# Patient Record
Sex: Female | Born: 1935 | Race: Black or African American | Hispanic: No | Smoking: Never smoker
Health system: Southern US, Community
[De-identification: ages and names within clinical notes are randomized; demographics above are authoritative.]

## PROBLEM LIST (undated history)

## (undated) DIAGNOSIS — I2510 Atherosclerotic heart disease of native coronary artery without angina pectoris: Secondary | ICD-10-CM

## (undated) DIAGNOSIS — R441 Visual hallucinations: Secondary | ICD-10-CM

## (undated) DIAGNOSIS — F209 Schizophrenia, unspecified: Secondary | ICD-10-CM

## (undated) DIAGNOSIS — N183 Chronic kidney disease, stage 3 (moderate): Secondary | ICD-10-CM

## (undated) DIAGNOSIS — I428 Other cardiomyopathies: Secondary | ICD-10-CM

## (undated) DIAGNOSIS — I5032 Chronic diastolic (congestive) heart failure: Secondary | ICD-10-CM

## (undated) DIAGNOSIS — F419 Anxiety disorder, unspecified: Secondary | ICD-10-CM

## (undated) DIAGNOSIS — I10 Essential (primary) hypertension: Secondary | ICD-10-CM

## (undated) DIAGNOSIS — M1990 Unspecified osteoarthritis, unspecified site: Secondary | ICD-10-CM

## (undated) DIAGNOSIS — E11359 Type 2 diabetes mellitus with proliferative diabetic retinopathy without macular edema: Secondary | ICD-10-CM

## (undated) DIAGNOSIS — K219 Gastro-esophageal reflux disease without esophagitis: Secondary | ICD-10-CM

## (undated) DIAGNOSIS — H409 Unspecified glaucoma: Secondary | ICD-10-CM

## (undated) DIAGNOSIS — I5020 Unspecified systolic (congestive) heart failure: Secondary | ICD-10-CM

## (undated) DIAGNOSIS — G4733 Obstructive sleep apnea (adult) (pediatric): Secondary | ICD-10-CM

## (undated) DIAGNOSIS — E785 Hyperlipidemia, unspecified: Secondary | ICD-10-CM

## (undated) DIAGNOSIS — Z5189 Encounter for other specified aftercare: Secondary | ICD-10-CM

## (undated) HISTORY — DX: Other cardiomyopathies: I42.8

## (undated) HISTORY — PX: CORONARY ANGIOPLASTY WITH STENT PLACEMENT: SHX49

---

## 2003-10-07 ENCOUNTER — Encounter: Payer: Self-pay | Admitting: Emergency Medicine

## 2003-10-07 ENCOUNTER — Emergency Department (HOSPITAL_COMMUNITY): Admission: EM | Admit: 2003-10-07 | Discharge: 2003-10-07 | Payer: Self-pay | Admitting: Emergency Medicine

## 2004-03-20 ENCOUNTER — Ambulatory Visit (HOSPITAL_COMMUNITY): Admission: RE | Admit: 2004-03-20 | Discharge: 2004-03-20 | Payer: Self-pay | Admitting: *Deleted

## 2004-03-20 ENCOUNTER — Encounter: Payer: Self-pay | Admitting: Cardiology

## 2005-03-09 ENCOUNTER — Emergency Department (HOSPITAL_COMMUNITY): Admission: EM | Admit: 2005-03-09 | Discharge: 2005-03-09 | Payer: Self-pay | Admitting: *Deleted

## 2005-03-18 ENCOUNTER — Emergency Department (HOSPITAL_COMMUNITY): Admission: EM | Admit: 2005-03-18 | Discharge: 2005-03-18 | Payer: Self-pay | Admitting: Emergency Medicine

## 2005-03-20 ENCOUNTER — Ambulatory Visit: Payer: Self-pay | Admitting: Internal Medicine

## 2005-03-25 ENCOUNTER — Ambulatory Visit: Payer: Self-pay | Admitting: Internal Medicine

## 2005-04-06 ENCOUNTER — Ambulatory Visit: Payer: Self-pay | Admitting: Internal Medicine

## 2005-04-20 ENCOUNTER — Ambulatory Visit: Payer: Self-pay | Admitting: Internal Medicine

## 2005-05-05 ENCOUNTER — Ambulatory Visit: Payer: Self-pay | Admitting: Internal Medicine

## 2005-05-14 ENCOUNTER — Ambulatory Visit: Payer: Self-pay | Admitting: Internal Medicine

## 2005-05-21 ENCOUNTER — Ambulatory Visit: Payer: Self-pay | Admitting: Internal Medicine

## 2005-05-28 ENCOUNTER — Ambulatory Visit: Payer: Self-pay | Admitting: Internal Medicine

## 2005-12-16 ENCOUNTER — Emergency Department (HOSPITAL_COMMUNITY): Admission: EM | Admit: 2005-12-16 | Discharge: 2005-12-17 | Payer: Self-pay | Admitting: Emergency Medicine

## 2005-12-28 ENCOUNTER — Ambulatory Visit: Payer: Self-pay | Admitting: Internal Medicine

## 2006-01-19 ENCOUNTER — Ambulatory Visit: Payer: Self-pay | Admitting: Hospitalist

## 2006-02-02 ENCOUNTER — Ambulatory Visit: Payer: Self-pay | Admitting: Internal Medicine

## 2006-02-10 ENCOUNTER — Ambulatory Visit: Payer: Self-pay | Admitting: Internal Medicine

## 2006-02-11 ENCOUNTER — Ambulatory Visit: Payer: Self-pay | Admitting: Cardiovascular Disease

## 2006-02-12 ENCOUNTER — Ambulatory Visit: Payer: Self-pay

## 2006-02-25 ENCOUNTER — Ambulatory Visit: Payer: Self-pay | Admitting: Internal Medicine

## 2006-03-11 ENCOUNTER — Ambulatory Visit: Payer: Self-pay | Admitting: Hospitalist

## 2006-03-19 ENCOUNTER — Ambulatory Visit: Payer: Self-pay | Admitting: Cardiovascular Disease

## 2006-05-24 ENCOUNTER — Ambulatory Visit: Payer: Self-pay | Admitting: Internal Medicine

## 2006-05-28 ENCOUNTER — Ambulatory Visit: Payer: Self-pay | Admitting: Internal Medicine

## 2006-05-28 ENCOUNTER — Inpatient Hospital Stay (HOSPITAL_COMMUNITY): Admission: AD | Admit: 2006-05-28 | Discharge: 2006-06-03 | Payer: Self-pay | Admitting: Internal Medicine

## 2006-05-31 ENCOUNTER — Encounter: Payer: Self-pay | Admitting: Internal Medicine

## 2006-06-07 ENCOUNTER — Ambulatory Visit: Payer: Self-pay | Admitting: Internal Medicine

## 2006-06-09 ENCOUNTER — Ambulatory Visit: Payer: Self-pay | Admitting: Internal Medicine

## 2006-06-16 ENCOUNTER — Ambulatory Visit: Payer: Self-pay | Admitting: Internal Medicine

## 2006-06-17 ENCOUNTER — Ambulatory Visit: Payer: Self-pay | Admitting: Cardiovascular Disease

## 2006-06-28 ENCOUNTER — Ambulatory Visit: Payer: Self-pay | Admitting: Internal Medicine

## 2006-07-01 ENCOUNTER — Ambulatory Visit (HOSPITAL_COMMUNITY): Admission: RE | Admit: 2006-07-01 | Discharge: 2006-07-01 | Payer: Self-pay | Admitting: Internal Medicine

## 2006-09-29 ENCOUNTER — Emergency Department (HOSPITAL_COMMUNITY): Admission: EM | Admit: 2006-09-29 | Discharge: 2006-09-29 | Payer: Self-pay | Admitting: Emergency Medicine

## 2006-10-01 ENCOUNTER — Ambulatory Visit: Payer: Self-pay | Admitting: Internal Medicine

## 2006-10-01 LAB — CONVERTED CEMR LAB
CO2: 25 meq/L (ref 19–32)
Chloride: 102 meq/L (ref 96–112)
Glucose, Bld: 134 mg/dL — ABNORMAL HIGH (ref 70–99)
Sodium: 141 meq/L (ref 135–145)

## 2006-12-27 DIAGNOSIS — E113599 Type 2 diabetes mellitus with proliferative diabetic retinopathy without macular edema, unspecified eye: Secondary | ICD-10-CM

## 2006-12-27 DIAGNOSIS — E785 Hyperlipidemia, unspecified: Secondary | ICD-10-CM

## 2006-12-27 DIAGNOSIS — I10 Essential (primary) hypertension: Secondary | ICD-10-CM

## 2006-12-27 DIAGNOSIS — I5032 Chronic diastolic (congestive) heart failure: Secondary | ICD-10-CM

## 2006-12-27 DIAGNOSIS — H547 Unspecified visual loss: Secondary | ICD-10-CM

## 2006-12-27 HISTORY — DX: Hyperlipidemia, unspecified: E78.5

## 2007-01-10 DIAGNOSIS — N183 Chronic kidney disease, stage 3 unspecified: Secondary | ICD-10-CM

## 2007-01-10 DIAGNOSIS — M109 Gout, unspecified: Secondary | ICD-10-CM

## 2007-01-10 HISTORY — DX: Chronic kidney disease, stage 3 unspecified: N18.30

## 2007-04-25 ENCOUNTER — Telehealth (INDEPENDENT_AMBULATORY_CARE_PROVIDER_SITE_OTHER): Payer: Self-pay | Admitting: *Deleted

## 2007-05-30 ENCOUNTER — Ambulatory Visit: Payer: Self-pay | Admitting: Internal Medicine

## 2007-05-30 LAB — CONVERTED CEMR LAB: Hgb A1c MFr Bld: 6.5 %

## 2007-06-03 ENCOUNTER — Ambulatory Visit: Payer: Self-pay | Admitting: Internal Medicine

## 2007-06-03 ENCOUNTER — Encounter: Payer: Self-pay | Admitting: Internal Medicine

## 2007-06-20 LAB — CONVERTED CEMR LAB
BUN: 19 mg/dL (ref 6–23)
CO2: 27 meq/L (ref 19–32)
Creatinine, Ser: 1.1 mg/dL (ref 0.40–1.20)
Glucose, Bld: 102 mg/dL — ABNORMAL HIGH (ref 70–99)
Sodium: 140 meq/L (ref 135–145)
TSH: 0.752 microintl units/mL (ref 0.350–5.50)
Total CHOL/HDL Ratio: 4.4

## 2007-10-21 ENCOUNTER — Telehealth: Payer: Self-pay | Admitting: Internal Medicine

## 2008-01-03 ENCOUNTER — Encounter (INDEPENDENT_AMBULATORY_CARE_PROVIDER_SITE_OTHER): Payer: Self-pay | Admitting: *Deleted

## 2008-01-03 ENCOUNTER — Ambulatory Visit: Payer: Self-pay | Admitting: Internal Medicine

## 2008-01-03 DIAGNOSIS — J309 Allergic rhinitis, unspecified: Secondary | ICD-10-CM | POA: Insufficient documentation

## 2008-01-03 LAB — CONVERTED CEMR LAB
Blood Glucose, Fingerstick: 145
Hgb A1c MFr Bld: 7.3 %

## 2008-01-04 LAB — CONVERTED CEMR LAB
Albumin: 3.9 g/dL (ref 3.5–5.2)
BUN: 22 mg/dL (ref 6–23)
CO2: 27 meq/L (ref 19–32)
Calcium: 9 mg/dL (ref 8.4–10.5)
Chloride: 104 meq/L (ref 96–112)
Potassium: 4.3 meq/L (ref 3.5–5.3)
Sodium: 141 meq/L (ref 135–145)
TSH: 0.817 microintl units/mL (ref 0.350–5.50)
Total Bilirubin: 0.4 mg/dL (ref 0.3–1.2)
Total Protein: 7.7 g/dL (ref 6.0–8.3)

## 2008-01-15 ENCOUNTER — Emergency Department (HOSPITAL_COMMUNITY): Admission: EM | Admit: 2008-01-15 | Discharge: 2008-01-15 | Payer: Self-pay | Admitting: Emergency Medicine

## 2008-02-08 ENCOUNTER — Ambulatory Visit: Payer: Self-pay | Admitting: Internal Medicine

## 2008-02-08 ENCOUNTER — Encounter: Payer: Self-pay | Admitting: Internal Medicine

## 2008-02-08 LAB — CONVERTED CEMR LAB
ALT: 12 units/L (ref 0–35)
AST: 11 units/L (ref 0–37)
BUN: 16 mg/dL (ref 6–23)
Blood Glucose, Fingerstick: 124
Creatinine, Ser: 1.22 mg/dL — ABNORMAL HIGH (ref 0.40–1.20)
Creatinine, Urine: 18.5 mg/dL
Eosinophils Relative: 4 % (ref 0–5)
Glucose, Bld: 56 mg/dL — ABNORMAL LOW (ref 70–99)
HCT: 44.8 % (ref 36.0–46.0)
Hemoglobin, Urine: NEGATIVE
LDL Cholesterol: 177 mg/dL — ABNORMAL HIGH (ref 0–99)
Leukocytes, UA: NEGATIVE
Lymphs Abs: 2.6 10*3/uL (ref 0.7–4.0)
MCHC: 31.9 g/dL (ref 30.0–36.0)
Microalb, Ur: 2.05 mg/dL — ABNORMAL HIGH (ref 0.00–1.89)
Monocytes Absolute: 0.7 10*3/uL (ref 0.1–1.0)
Neutro Abs: 3.3 10*3/uL (ref 1.7–7.7)
Neutrophils Relative %: 48 % (ref 43–77)
Nitrite: NEGATIVE
Platelets: 273 10*3/uL (ref 150–400)
Protein, ur: NEGATIVE mg/dL
Sodium: 143 meq/L (ref 135–145)
Specific Gravity, Urine: 1.009 (ref 1.005–1.03)
Total Bilirubin: 0.4 mg/dL (ref 0.3–1.2)
Total Protein: 8 g/dL (ref 6.0–8.3)
Triglycerides: 235 mg/dL — ABNORMAL HIGH (ref <150)
Urine Glucose: NEGATIVE mg/dL
pH: 6.5 (ref 5.0–8.0)

## 2008-02-15 ENCOUNTER — Ambulatory Visit: Payer: Self-pay | Admitting: Internal Medicine

## 2008-03-19 ENCOUNTER — Encounter: Payer: Self-pay | Admitting: Internal Medicine

## 2008-04-09 ENCOUNTER — Encounter: Payer: Self-pay | Admitting: Internal Medicine

## 2008-05-07 ENCOUNTER — Encounter: Payer: Self-pay | Admitting: Internal Medicine

## 2008-05-08 ENCOUNTER — Telehealth (INDEPENDENT_AMBULATORY_CARE_PROVIDER_SITE_OTHER): Payer: Self-pay | Admitting: *Deleted

## 2008-05-09 ENCOUNTER — Encounter: Payer: Self-pay | Admitting: Internal Medicine

## 2008-10-07 ENCOUNTER — Emergency Department (HOSPITAL_COMMUNITY): Admission: EM | Admit: 2008-10-07 | Discharge: 2008-10-07 | Payer: Self-pay | Admitting: Emergency Medicine

## 2008-10-10 ENCOUNTER — Ambulatory Visit: Payer: Self-pay | Admitting: Internal Medicine

## 2008-10-10 ENCOUNTER — Encounter: Payer: Self-pay | Admitting: Internal Medicine

## 2008-10-10 DIAGNOSIS — R42 Dizziness and giddiness: Secondary | ICD-10-CM | POA: Insufficient documentation

## 2008-10-10 LAB — CONVERTED CEMR LAB
Blood Glucose, Fingerstick: 141
Calcium: 8.9 mg/dL (ref 8.4–10.5)
Chloride: 101 meq/L (ref 96–112)
Creatinine, Ser: 1.37 mg/dL — ABNORMAL HIGH (ref 0.40–1.20)
Glucose, Bld: 134 mg/dL — ABNORMAL HIGH (ref 70–99)
Sodium: 142 meq/L (ref 135–145)

## 2008-10-15 ENCOUNTER — Telehealth: Payer: Self-pay | Admitting: Internal Medicine

## 2008-10-16 ENCOUNTER — Encounter: Payer: Self-pay | Admitting: Internal Medicine

## 2008-10-16 ENCOUNTER — Ambulatory Visit (HOSPITAL_COMMUNITY): Admission: RE | Admit: 2008-10-16 | Discharge: 2008-10-16 | Payer: Self-pay | Admitting: Internal Medicine

## 2008-10-16 ENCOUNTER — Ambulatory Visit: Payer: Self-pay | Admitting: Cardiology

## 2008-10-19 DIAGNOSIS — I2510 Atherosclerotic heart disease of native coronary artery without angina pectoris: Secondary | ICD-10-CM | POA: Insufficient documentation

## 2008-11-01 ENCOUNTER — Ambulatory Visit: Payer: Self-pay | Admitting: Cardiovascular Disease

## 2008-12-06 ENCOUNTER — Ambulatory Visit: Payer: Self-pay | Admitting: Internal Medicine

## 2008-12-06 ENCOUNTER — Encounter: Payer: Self-pay | Admitting: Internal Medicine

## 2008-12-06 ENCOUNTER — Encounter: Payer: Self-pay | Admitting: Licensed Clinical Social Worker

## 2008-12-06 DIAGNOSIS — H409 Unspecified glaucoma: Secondary | ICD-10-CM | POA: Insufficient documentation

## 2008-12-06 HISTORY — DX: Unspecified glaucoma: H40.9

## 2008-12-06 LAB — CONVERTED CEMR LAB
Creatinine, Ser: 1.1 mg/dL (ref 0.40–1.20)
Glucose, Bld: 132 mg/dL — ABNORMAL HIGH (ref 70–99)
Potassium: 4.3 meq/L (ref 3.5–5.3)

## 2008-12-17 ENCOUNTER — Encounter: Payer: Self-pay | Admitting: Internal Medicine

## 2008-12-24 ENCOUNTER — Ambulatory Visit (HOSPITAL_COMMUNITY): Admission: RE | Admit: 2008-12-24 | Discharge: 2008-12-24 | Payer: Self-pay | Admitting: Ophthalmology

## 2009-01-28 DIAGNOSIS — E113599 Type 2 diabetes mellitus with proliferative diabetic retinopathy without macular edema, unspecified eye: Secondary | ICD-10-CM

## 2009-01-28 DIAGNOSIS — E11359 Type 2 diabetes mellitus with proliferative diabetic retinopathy without macular edema: Secondary | ICD-10-CM

## 2009-01-28 HISTORY — DX: Type 2 diabetes mellitus with proliferative diabetic retinopathy without macular edema: E11.359

## 2009-01-28 LAB — HM DIABETES EYE EXAM

## 2009-01-30 ENCOUNTER — Ambulatory Visit (HOSPITAL_COMMUNITY): Admission: RE | Admit: 2009-01-30 | Discharge: 2009-01-30 | Payer: Self-pay | Admitting: Ophthalmology

## 2009-03-04 ENCOUNTER — Ambulatory Visit: Payer: Self-pay | Admitting: Internal Medicine

## 2009-03-04 LAB — CONVERTED CEMR LAB: Hgb A1c MFr Bld: 6.4 %

## 2009-04-29 ENCOUNTER — Ambulatory Visit (HOSPITAL_COMMUNITY): Admission: RE | Admit: 2009-04-29 | Discharge: 2009-04-29 | Payer: Self-pay | Admitting: Ophthalmology

## 2009-05-17 DIAGNOSIS — R609 Edema, unspecified: Secondary | ICD-10-CM | POA: Insufficient documentation

## 2009-05-22 ENCOUNTER — Ambulatory Visit: Payer: Self-pay | Admitting: Cardiovascular Disease

## 2009-05-29 ENCOUNTER — Encounter: Payer: Self-pay | Admitting: Internal Medicine

## 2009-05-29 ENCOUNTER — Ambulatory Visit: Payer: Self-pay | Admitting: Internal Medicine

## 2009-05-29 LAB — CONVERTED CEMR LAB
BUN: 23 mg/dL (ref 6–23)
Blood Glucose, Fingerstick: 136
CO2: 25 meq/L (ref 19–32)
Creatinine, Ser: 1.23 mg/dL — ABNORMAL HIGH (ref 0.40–1.20)
Glucose, Bld: 128 mg/dL — ABNORMAL HIGH (ref 70–99)
Hgb A1c MFr Bld: 5.9 %
Potassium: 4.3 meq/L (ref 3.5–5.3)
Sodium: 137 meq/L (ref 135–145)

## 2009-07-01 ENCOUNTER — Emergency Department (HOSPITAL_COMMUNITY): Admission: EM | Admit: 2009-07-01 | Discharge: 2009-07-01 | Payer: Self-pay | Admitting: Emergency Medicine

## 2009-07-02 ENCOUNTER — Ambulatory Visit: Payer: Self-pay | Admitting: Internal Medicine

## 2009-07-02 ENCOUNTER — Encounter (INDEPENDENT_AMBULATORY_CARE_PROVIDER_SITE_OTHER): Payer: Self-pay | Admitting: Internal Medicine

## 2009-07-02 DIAGNOSIS — R141 Gas pain: Secondary | ICD-10-CM | POA: Insufficient documentation

## 2009-07-02 DIAGNOSIS — R143 Flatulence: Secondary | ICD-10-CM

## 2009-07-02 DIAGNOSIS — R142 Eructation: Secondary | ICD-10-CM

## 2009-07-02 LAB — CONVERTED CEMR LAB
Bilirubin Urine: NEGATIVE
Blood in Urine, dipstick: NEGATIVE
Glucose, Urine, Semiquant: NEGATIVE
Nitrite: NEGATIVE
Protein, U semiquant: NEGATIVE
Specific Gravity, Urine: <1.005
Urobilinogen, UA: 0.2
WBC Urine, dipstick: NEGATIVE
pH: 5.5

## 2009-07-15 ENCOUNTER — Ambulatory Visit (HOSPITAL_COMMUNITY): Admission: RE | Admit: 2009-07-15 | Discharge: 2009-07-15 | Payer: Self-pay | Admitting: Internal Medicine

## 2009-07-17 ENCOUNTER — Encounter (INDEPENDENT_AMBULATORY_CARE_PROVIDER_SITE_OTHER): Payer: Self-pay | Admitting: Internal Medicine

## 2009-08-12 ENCOUNTER — Telehealth: Payer: Self-pay | Admitting: Internal Medicine

## 2009-08-13 ENCOUNTER — Encounter: Payer: Self-pay | Admitting: Internal Medicine

## 2009-08-14 ENCOUNTER — Ambulatory Visit (HOSPITAL_COMMUNITY): Admission: RE | Admit: 2009-08-14 | Discharge: 2009-08-15 | Payer: Self-pay | Admitting: Ophthalmology

## 2009-10-24 ENCOUNTER — Encounter: Payer: Self-pay | Admitting: Internal Medicine

## 2010-01-22 ENCOUNTER — Encounter: Payer: Self-pay | Admitting: Internal Medicine

## 2010-01-22 ENCOUNTER — Emergency Department (HOSPITAL_COMMUNITY): Admission: EM | Admit: 2010-01-22 | Discharge: 2010-01-22 | Payer: Self-pay | Admitting: Emergency Medicine

## 2010-02-03 ENCOUNTER — Ambulatory Visit: Payer: Self-pay | Admitting: Internal Medicine

## 2010-02-03 DIAGNOSIS — M19039 Primary osteoarthritis, unspecified wrist: Secondary | ICD-10-CM

## 2010-02-03 LAB — CONVERTED CEMR LAB
ALT: 8 units/L (ref 0–35)
AST: 10 units/L (ref 0–37)
BUN: 23 mg/dL (ref 6–23)
Basophils Absolute: 0 10*3/uL (ref 0.0–0.1)
Calcium: 9.1 mg/dL (ref 8.4–10.5)
Cholesterol: 180 mg/dL (ref 0–200)
Creatinine, Ser: 1.25 mg/dL — ABNORMAL HIGH (ref 0.40–1.20)
HCT: 40.7 % (ref 36.0–46.0)
Hemoglobin: 13.3 g/dL (ref 12.0–15.0)
MCHC: 32.7 g/dL (ref 30.0–36.0)
MCV: 93.8 fL (ref >=78.0)
Monocytes Relative: 7 % (ref 3–12)
Potassium: 4.3 meq/L (ref 3.5–5.3)
RDW: 12.7 % (ref 11.5–15.5)
Sodium: 140 meq/L (ref 135–145)
Total Protein: 7.2 g/dL (ref 6.0–8.3)
VLDL: 27 mg/dL (ref 0–40)
WBC: 6.5 10*3/uL (ref 4.0–10.5)

## 2010-02-11 ENCOUNTER — Inpatient Hospital Stay (HOSPITAL_COMMUNITY): Admission: EM | Admit: 2010-02-11 | Discharge: 2010-02-12 | Payer: Self-pay | Admitting: Emergency Medicine

## 2010-02-11 ENCOUNTER — Ambulatory Visit: Payer: Self-pay | Admitting: Cardiology

## 2010-02-21 ENCOUNTER — Emergency Department (HOSPITAL_COMMUNITY): Admission: EM | Admit: 2010-02-21 | Discharge: 2010-02-22 | Payer: Self-pay | Admitting: Emergency Medicine

## 2010-02-21 ENCOUNTER — Encounter: Payer: Self-pay | Admitting: Cardiovascular Disease

## 2010-03-07 ENCOUNTER — Encounter: Payer: Self-pay | Admitting: Internal Medicine

## 2010-03-18 ENCOUNTER — Encounter: Payer: Self-pay | Admitting: Cardiovascular Disease

## 2010-03-19 ENCOUNTER — Ambulatory Visit: Payer: Self-pay | Admitting: Cardiovascular Disease

## 2010-06-13 ENCOUNTER — Encounter: Payer: Self-pay | Admitting: Internal Medicine

## 2010-06-30 ENCOUNTER — Encounter: Payer: Self-pay | Admitting: Internal Medicine

## 2010-07-10 ENCOUNTER — Encounter: Payer: Self-pay | Admitting: Internal Medicine

## 2010-08-06 ENCOUNTER — Encounter: Payer: Self-pay | Admitting: Internal Medicine

## 2010-08-07 ENCOUNTER — Emergency Department (HOSPITAL_COMMUNITY): Admission: EM | Admit: 2010-08-07 | Discharge: 2010-08-07 | Payer: Self-pay | Admitting: Emergency Medicine

## 2010-09-11 ENCOUNTER — Ambulatory Visit: Payer: Self-pay | Admitting: Internal Medicine

## 2010-09-11 DIAGNOSIS — B351 Tinea unguium: Secondary | ICD-10-CM | POA: Insufficient documentation

## 2010-09-11 LAB — CONVERTED CEMR LAB
BUN: 12 mg/dL (ref 6–23)
Blood Glucose, AC Bkfst: 190 mg/dL
CO2: 30 meq/L (ref 19–32)
Calcium: 9 mg/dL (ref 8.4–10.5)
Chloride: 99 meq/L (ref 96–112)
Cholesterol: 190 mg/dL (ref 0–200)
Creatinine, Ser: 1.19 mg/dL (ref 0.40–1.20)
Eosinophils Relative: 3 % (ref 0–5)
HCT: 40.1 % (ref 36.0–46.0)
HDL: 43 mg/dL (ref >39)
Lymphs Abs: 1.9 10*3/uL (ref 0.7–4.0)
Monocytes Relative: 9 % (ref 3–12)
Neutro Abs: 3.7 10*3/uL (ref 1.7–7.7)
Platelets: 241 10*3/uL (ref 150–400)
RBC: 4.41 M/uL (ref 3.87–5.11)
Total CHOL/HDL Ratio: 4.4
Triglycerides: 144 mg/dL (ref <150)
VLDL: 29 mg/dL (ref 0–40)
WBC: 6.3 10*3/uL (ref 4.0–10.5)

## 2010-09-11 LAB — HM DIABETES FOOT EXAM

## 2010-09-18 ENCOUNTER — Emergency Department (HOSPITAL_COMMUNITY): Admission: EM | Admit: 2010-09-18 | Discharge: 2010-09-18 | Payer: Self-pay | Admitting: Emergency Medicine

## 2010-09-18 ENCOUNTER — Encounter: Payer: Self-pay | Admitting: Internal Medicine

## 2010-09-29 ENCOUNTER — Telehealth: Payer: Self-pay | Admitting: Internal Medicine

## 2010-10-02 ENCOUNTER — Encounter: Payer: Self-pay | Admitting: Internal Medicine

## 2010-10-02 ENCOUNTER — Ambulatory Visit: Payer: Self-pay | Admitting: Internal Medicine

## 2010-10-13 ENCOUNTER — Telehealth (INDEPENDENT_AMBULATORY_CARE_PROVIDER_SITE_OTHER): Payer: Self-pay | Admitting: *Deleted

## 2010-10-13 ENCOUNTER — Ambulatory Visit: Payer: Self-pay | Admitting: Internal Medicine

## 2010-10-13 DIAGNOSIS — H543 Unqualified visual loss, both eyes: Secondary | ICD-10-CM | POA: Insufficient documentation

## 2010-10-14 ENCOUNTER — Telehealth: Payer: Self-pay | Admitting: Licensed Clinical Social Worker

## 2010-10-20 ENCOUNTER — Encounter: Payer: Self-pay | Admitting: Licensed Clinical Social Worker

## 2010-10-20 ENCOUNTER — Ambulatory Visit: Payer: Self-pay | Admitting: Internal Medicine

## 2010-10-20 LAB — CONVERTED CEMR LAB: Blood Glucose, Fingerstick: 157

## 2010-10-25 LAB — CONVERTED CEMR LAB
BUN: 29 mg/dL — ABNORMAL HIGH (ref 6–23)
Glucose, Bld: 147 mg/dL — ABNORMAL HIGH (ref 70–99)
Potassium: 4.4 meq/L (ref 3.5–5.3)
Sodium: 140 meq/L (ref 135–145)

## 2010-12-21 HISTORY — PX: EYE SURGERY: SHX253

## 2011-01-20 NOTE — Progress Notes (Signed)
Summary: referral for diabetes & nutrition/dmr  ---- Converted from flag ---- ---- 09/28/2010 7:56 PM, Julaine Fusi  DO wrote: Genella Mech, My patient went from an A1C of 6.4 to 9.9 in a few months --grrrrrr. Life changes- now living with son who is really sedentary and she is eating very poorly- before she had a grandson cooking healthy food for her I think.. She is going to need insulin. I wanted to make sure that she gets in to see you and maybe bring her son along too. thanks! BG ------------------------------  Phone Note Outgoing Call   Call placed by: Jamison Neighbor RD,CDE,  September 29, 2010 9:03 AM Summary of Call: referral from Dr. Phillips Odor- appointment set for 10-02-10, I requested son attend with her several times, but instead grandaughter will be coming with her.   Follow-up for Phone Call        thanks. Follow-up by: Julaine Fusi  DO,  September 29, 2010 10:45 AM      Diabetes Self Management Training Referral Patient Name: [name] Date Of Birth: [birth year] MRN: [id number] Current Diagnosis:  ONYCHOMYCOSIS (ICD-110.1) OSTEOARTHRITIS, WRIST, LEFT (ICD-715.13) ABDOMINAL DISTENSION (ICD-787.3) EDEMA (ICD-782.3) CARDIOMYOPATHY, SECONDARY (ICD-425.9) PROLIFERATIVE DIABETIC RETINOPATHY (ICD-362.02) GLAUCOMA (ICD-365.9) CORONARY ARTERY DISEASE (ICD-414.00) INTERMITTENT VERTIGO (ICD-780.4) ALLERGIC RHINITIS (ICD-477.9) CONGESTIVE HEART FAILURE, MODERATE (ICD-428.0) Hx of GOUT NOS (ICD-274.9) RENAL INSUFFICIENCY, CHRONIC (ICD-585.9) HYPERTENSION (ICD-401.9) HYPERLIPIDEMIA (ICD-272.4) DIABETES MELLITUS, TYPE II (ICD-250.00)     Management Training Needs:   Follow-up DSMT(2 hours/year)  Insulin Instruction  Annual Follow-up MNT(2 hours/year)  Complicating Conditions:  HTN  Retinopathy  Dyslipidemia  Recurrent Hyperglycemia  CVD  Nephropathy  Barriers:  Impaired vision  Impaired dextarity  Age  Mental status

## 2011-01-20 NOTE — Miscellaneous (Signed)
Summary: CXR  Clinical Lists Changes  Observations: Added new observation of CXR RESULTS:    IMPRESSION:   Cardiomegaly with slight pulmonary vascular congestion.   No acute abnormalities.    Read By:  Lollie Marrow,  M.D.   Released By:  Lollie Marrow,  M.D. (02/22/2010 11:28) Added new observation of CXR RESULTS:    IMPRESSION:   Cardiomegaly.  Suboptimal inspiration accounts for mild bibasilar   atelectasis.  No acute cardiopulmonary disease otherwise.    Read By:  Arnell Sieving,  M.D.   Released By:  Arnell Sieving,  M.D.  (02/11/2010 11:29)      CXR  Procedure date:  02/22/2010  Findings:         IMPRESSION:   Cardiomegaly with slight pulmonary vascular congestion.   No acute abnormalities.    Read By:  Lollie Marrow,  M.D.   Released By:  Lollie Marrow,  M.D.  CXR  Procedure date:  02/11/2010  Findings:         IMPRESSION:   Cardiomegaly.  Suboptimal inspiration accounts for mild bibasilar   atelectasis.  No acute cardiopulmonary disease otherwise.    Read By:  Arnell Sieving,  M.D.   Released By:  Arnell Sieving,  Judie Petit.D.
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
## 2011-01-20 NOTE — Consult Note (Signed)
Summary: ED Consultation  INTERNAL MEDICINE:  ED Consultation PCP:  Phillips Odor CC:  CP  HPI: 75 yo woman with PMH as outlined below who presents to ED with CC of right chest pain for last 2 days.  Pain began 2 days PTA after eating.  She describes it as localized over right chest, sharp and very short lived.  Thought it was gas.  It was associated with some palpitations and ? diaphoresis.  She had another episode on the day PTA similar in nature but of lesser intensity.   ALLERGIES: LISINOPRIL .... intolerance with cough  PAST MEDICAL HISTORY: EDEMA (ICD-782.3)  CARDIOMYOPATHY, SECONDARY (ICD-425.9): normal cath 2007 EF 30-35% echo 2009       -Non ischemic PROLIFERATIVE DIABETIC RETINOPATHY (ICD-362.02)  GLAUCOMA (ICD-365.9)  CORONARY ARTERY DISEASE (ICD-414.00)       -LHC 2007 with 40% tubular lesion LAD (LM, LCx, and RCA all normal) INTERMITTENT VERTIGO (ICD-780.4)  ALLERGIC RHINITIS (ICD-477.9)  CONGESTIVE HEART FAILURE, MODERATE (ICD-428.0)  Hx of GOUT NOS (ICD-274.9)  RENAL INSUFFICIENCY, CHRONIC (ICD-585.9)  HYPERTENSION (ICD-401.9)  HYPERLIPIDEMIA (ICD-272.4)  DIABETES MELLITUS, TYPE II (ICD-250.00)    MEDICATIONS: GLIPIZIDE 10 MG TABS (GLIPIZIDE) Take 1 tablet by mouth two times a day LASIX 40 MG TABS (FUROSEMIDE) Take 1and 1/2  tablets by mouth once a day NORVASC 10 MG TABS (AMLODIPINE BESYLATE) Take 1 tablet by mouth once a day ASPIRIN 81 MG TBEC (ASPIRIN) Take 1 tablet by mouth once a day COREG 12.5 MG  TABS (CARVEDILOL) Take 1 tablet by mouth two times a day COZAAR 25 MG  TABS (LOSARTAN POTASSIUM) Take 1 tablet by mouth once a day ZOCOR 20 MG  TABS (SIMVASTATIN) Take 1 tablet by mouth once a day   SOCIAL HISTORY: Son lives with her.  Lives in [place].  Widowed.  Smoked >40y ago.  No alcohol. No drug use.  Worked caring for the elderly for 38 years.   FAMILY HISTORY Mother: living, 2 (Alzheimer's disease) Father: living, 16 (prostate  cancer) Siblings: 2 brothers, 2 sisters (sister has "leg" problems) Children: 5 (healthy, but several smoke)   ROS: all other systems reviewed and negative   VITALS: T:98.4  P:83  BP:189/89  R:18  O2SAT:98  ON:RA  PHYSICAL EXAM: General:  alert, obese, and cooperative to examination.   Head:  normocephalic and atraumatic.   Eyes:  markedly decreased vision Mouth:  pharynx pink and moist, no erythema, and no exudates.   Neck:  supple, full ROM, no thyromegaly, no JVD, and no carotid bruits.   Lungs:  normal respiratory effort, no accessory muscle use, normal breath sounds, no crackles, and no wheezes.   Heart:  normal rate, regular rhythm, no murmur, no gallop, and no rub.   Abdomen:  soft, non-tender, normal bowel sounds, no distention, no guarding, no rebound tenderness  Msk:  no joint swelling, no joint warmth, and no redness over joints.   Extremities:  No cyanosis, clubbing, edema  Neurologic:  alert & oriented X3, cranial nerves II-XII intact, strength normal in all extremities, sensation intact to light touch, and gait normal.   Skin:  turgor normal and no rashes.   Psych:  Oriented X3, memory intact for recent and remote, normally interactive, good eye contact, not anxious appearing, and not depressed appearing.     LABS:  Color, Urine                             YELLOW  YELLOW  Appearance                               CLOUDY     a      CLEAR  Specific Gravity                         1.015             1.005-1.030  pH                                       7.0               5.0-8.0  Urine Glucose                            NEGATIVE          NEG              mg/dL  Bilirubin                                NEGATIVE          NEG  Ketones                                  NEGATIVE          NEG              mg/dL  Blood                                    NEGATIVE          NEG  Protein                                  NEGATIVE          NEG              mg/dL  Urobilinogen                              1.0               0.0-1.0          mg/dL  Nitrite                                  NEGATIVE          NEG  Leukocytes                               NEGATIVE          NEG   WBC                                      5.5  4.0-10.5         K/uL  RBC                                      4.18              3.87-5.11        MIL/uL  Hemoglobin (HGB)                         13.1              12.0-15.0        g/dL  Hematocrit (HCT)                         39.3              36.0-46.0        %  MCV                                      94.2              78.0-100.0       fL  MCHC                                     33.4              30.0-36.0        g/dL  RDW                                      12.7              11.5-15.5        %  Platelet Count (PLT)                     211               150-400          K/uL  Neutrophils, %                           58                43-77            %  Lymphocytes, %                           29                12-46            %  Monocytes, %                             9                 3-12             %  Eosinophils, %  3                 0-5              %  Basophils, %                             1                 0-1              %  Neutrophils, Absolute                    3.2               1.7-7.7          K/uL  Lymphocytes, Absolute                    1.6               0.7-4.0          K/uL  Monocytes, Absolute                      0.5               0.1-1.0          K/uL  Eosinophils, Absolute                    0.2               0.0-0.7          K/uL  Basophils, Absolute                      0.0               0.0-0.1          K/uL   Sodium (NA)                              138               135-145          mEq/L  Potassium (K)                            3.6               3.5-5.1          mEq/L  Chloride                                 106               96-112           mEq/L  CO2                                       25                19-32            mEq/L  Glucose  130        h      70-99            mg/dL  BUN                                      13                6-23             mg/dL  Creatinine                               1.00              0.4-1.2          mg/dL  GFR, Est Non African American            55         l      >60              mL/min  GFR, Est African American                >60               >60              mL/min    Oversized comment, see footnote  1  Calcium                                  8.7               8.4-10.5         mg/dL   Creatine Kinase, Total                   33                7-177            U/L  CK, MB                                   0.9               0.3-4.0          ng/mL  Relative Index                           SEE NOTE.         0.0-2.5  Troponin I                               0.01              0.00-0.06        ng/m  CXR:   1.  Cardiomegaly and low lung volumes without acute disease.   2.  Mildly degraded exam due to AP portable technique and soft tissues overlying the lung bases.   ASSESSMENT AND PLAN: (1)  Atypical Chest Pain: Likely GI related with 1st episode occuring after eating and 2nd episode while lying down.  ACS unlikely with negative/unchanged ECG and negative CE with 1st episode  2 days ago and LHC with only 40% LAD tubular lesion 2007.  No signs of PNA with labs, vitals, CXR or history.  No widened mediastinum or PTX.  No evidence to suggest PE without provocation, no edema, no redness, no tenderness and Homan's sign negative.   BP elevated today, but well controlled on OP measurements  Plan: Will check LFTs to make sure this is not referred pain from hepatobiliary pathology Will d/c home if negative Has f/u with PCP Dr. Phillips Odor 01/27/10 @ 3:30pm Have arranged f/u with Dr. [name] 02/19/10 @ 8:15am Pt is to call clinic or return to ED if symptoms return

## 2011-01-20 NOTE — Letter (Signed)
Summary: [facility]   Imported By: Louretta Parma 07/25/2010 16:17:34  _____________________________________________________________________  External Attachment:    Type:   Image     Comment:   External Document

## 2011-01-20 NOTE — Letter (Signed)
Summary: [facility]   Imported By: Shon Hough 09/22/2010 15:31:47  _____________________________________________________________________  External Attachment:    Type:   Image     Comment:   External Document

## 2011-01-20 NOTE — Assessment & Plan Note (Signed)
Summary: bil foot swelling/gg   Vital Signs:  Patient profile:   75 year old female Height:      64 inches Weight:      257.6 pounds O2 Sat:      100 % on Room air Temp:     100.1 degrees F Pulse rate:   85 / minute BP sitting:   137 / 63  (left arm) Cuff size:   large  Vitals Entered By: Dorie Rank RN (October 13, 2010 2:24 PM)  O2 Flow:  Room air CC: bilateral feet swelling and tender especially on bottoms of feet - mainly since Friday - started with left foot Is Patient Diabetic? Yes Did you bring your meter with you today? Yes Pain Assessment Patient in pain? yes     Location: both feet Intensity: 10 Type: tender Onset of pain  3 days ago - standing makes pain worse Nutritional Status BMI of > 30 = obese CBG Result 141  Have you ever been in a relationship where you felt threatened, hurt or afraid?Unable to ask  Domestic Violence Intervention family in room  Does patient need assistance? Functional Status Self care, Cook/clean, Shopping, Social activities Ambulation Normal, Impaired:Risk for fall, Wheelchair Comments needs asst with most ADL - son fixes meals - granddtr helps with transportation   Primary Care Provider:  Julaine Fusi  DO  CC:  bilateral feet swelling and tender especially on bottoms of feet - mainly since Friday - started with left foot.  History of Present Illness: 75 yo female with PMH outlined below presents to [facility] with main concern of bilateral feet swelling that she first noticed 3-4 days ago. She is not sure why it happened butdid acknowledge that she ahs eaten some hot dogs and chilli and maybe it had more salt than she can take. She denies any similar episodes in the past but does report chronic LE swelling. She also mentions 2 week history of nonproductive cough, 3 pillow orthopnea which is chronic, denies any fever, chills, shortness of breath (occasionaly has SOB when she is try to do some activity), no chest pain, no pleuritic pain,  no urinary or abdominal concerns. She denies recent sicknesses or hospitalizations. She tells me that just recently she has started metformin and nexium. She reports compliance with her meds. She denies any traumas to the feet. She is mostly wheel chair bound and has relatively sedentary lifestyle.   Preventive Screening-Counseling & Management  Alcohol-Tobacco     Alcohol drinks/day: 0     Smoking Status: quit     Year Quit: YEARS AGO  Problems Prior to Update: 1)  Onychomycosis  (ICD-110.1) 2)  Osteoarthritis, Wrist, Left  (ICD-715.13) 3)  Abdominal Distension  (ICD-787.3) 4)  Edema  (ICD-782.3) 5)  Cardiomyopathy, Secondary  (ICD-425.9) 6)  Proliferative Diabetic Retinopathy  (ICD-362.02) 7)  Glaucoma  (ICD-365.9) 8)  Coronary Artery Disease  (ICD-414.00) 9)  Intermittent Vertigo  (ICD-780.4) 10)  Allergic Rhinitis  (ICD-477.9) 11)  Congestive Heart Failure, Moderate  (ICD-428.0) 12)  Hx of Gout Nos  (ICD-274.9) 13)  Renal Insufficiency, Chronic  (ICD-585.9) 14)  Hypertension  (ICD-401.9) 15)  Hyperlipidemia  (ICD-272.4) 16)  Diabetes Mellitus, Type II  (ICD-250.00)  Medications Prior to Update: 1)  Glipizide 10 Mg Xr24h-Tab (Glipizide) .... Take 1 Tablet By Mouth Once A Day 2)  Lasix 40 Mg Tabs (Furosemide) .... Take 1and 1/2  Tablets By Mouth Once A Day 3)  Norvasc 10 Mg Tabs (Amlodipine Besylate) .... Take  1 Tablet By Mouth Once A Day 4)  Aspirin 81 Mg Tbec (Aspirin) .... Take 1 Tablet By Mouth Once A Day 5)  Carvedilol 25 Mg Tabs (Carvedilol) .... Take 1 Tablet By Mouth Two Times A Day 6)  Cozaar 25 Mg  Tabs (Losartan Potassium) .... Take 1 Tablet By Mouth Once A Day 7)  Zocor 20 Mg  Tabs (Simvastatin) .... Take 1 Tablet By Mouth Once A Day 8)  Nexium 40 Mg Cpdr (Esomeprazole Magnesium) .... Take 1 Tablet By Mouth Once A Day 9)  Brimonidine Tartrate 0.15 % Soln (Brimonidine Tartrate) .... As Directed 10)  Timolol Maleate 0.5 % Solg (Timolol Maleate) .... As Directed 11)   Metformin Hcl 500 Mg Tabs (Metformin Hcl) .... Take 1 Tablet By Mouth Two Times A Day  Current Medications (verified): 1)  Glipizide 10 Mg Xr24h-Tab (Glipizide) .... Take 1 Tablet By Mouth Once A Day 2)  Lasix 40 Mg Tabs (Furosemide) .... Take 1and 1/2  Tablets By Mouth Once A Day 3)  Norvasc 10 Mg Tabs (Amlodipine Besylate) .... Take 1 Tablet By Mouth Once A Day 4)  Aspirin 81 Mg Tbec (Aspirin) .... Take 1 Tablet By Mouth Once A Day 5)  Carvedilol 25 Mg Tabs (Carvedilol) .... Take 1 Tablet By Mouth Two Times A Day 6)  Cozaar 25 Mg  Tabs (Losartan Potassium) .... Take 1 Tablet By Mouth Once A Day 7)  Zocor 20 Mg  Tabs (Simvastatin) .... Take 1 Tablet By Mouth Once A Day 8)  Nexium 40 Mg Cpdr (Esomeprazole Magnesium) .... Take 1 Tablet By Mouth Once A Day 9)  Brimonidine Tartrate 0.15 % Soln (Brimonidine Tartrate) .... As Directed 10)  Timolol Maleate 0.5 % Solg (Timolol Maleate) .... As Directed 11)  Metformin Hcl 500 Mg Tabs (Metformin Hcl) .... Take 1 Tablet By Mouth Two Times A Day  Allergies: 1)  ! * Lisinopril  Past History:  Past Medical History: Last updated: 05/17/2009 EDEMA (ICD-782.3) CARDIOMYOPATHY, SECONDARY (ICD-425.9): normal cath 2007 EF 30-35% echo 2009 PROLIFERATIVE DIABETIC RETINOPATHY (ICD-362.02) GLAUCOMA (ICD-365.9) CORONARY ARTERY DISEASE (ICD-414.00) INTERMITTENT VERTIGO (ICD-780.4) ALLERGIC RHINITIS (ICD-477.9) CONGESTIVE HEART FAILURE, MODERATE (ICD-428.0) Hx of GOUT NOS (ICD-274.9) RENAL INSUFFICIENCY, CHRONIC (ICD-585.9) HYPERTENSION (ICD-401.9) HYPERLIPIDEMIA (ICD-272.4) DIABETES MELLITUS, TYPE II (ICD-250.00)  Past Surgical History: Last updated: 05/17/2009  Posterior vitrectomy with membrane peel  Surgical posterior capsulotomy  Family History: Last updated: 01/03/2008 Mother: living, 22 (Alzheimer's disease) Father: living, 68 (prostate cancer) Siblings: 2 brothers, 2 sisters (sister has "leg" problems) Children: 5 (healthy, but several  smoke)  Social History: Last updated: 01/03/2008 Son lives with her.  Lives in [place].  Widowed.  Smoked >40y ago.  No alcohol. No drug use.  Worked caring for the elderly for 38 years.  Risk Factors: Alcohol Use: 0 (10/13/2010)  Risk Factors: Smoking Status: quit (10/13/2010)  Family History: Reviewed history from 01/03/2008 and no changes required. Mother: living, 12 (Alzheimer's disease) Father: living, 27 (prostate cancer) Siblings: 2 brothers, 2 sisters (sister has "leg" problems) Children: 5 (healthy, but several smoke)  Social History: Reviewed history from 01/03/2008 and no changes required. Son lives with her.  Lives in [place].  Widowed.  Smoked >40y ago.  No alcohol. No drug use.  Worked caring for the elderly for 38 years.  Review of Systems       per HPI  Physical Exam  General:  alert and overweight-appearing.   Mouth:  poor dentiotion, no OP erythema, moist MM Lungs:  poor inspiratory effort, fair air  movement but clear breath sounds, no wheezing, no ronchi, no crackles, no JVD Heart:  distant heart sounds, HR 88/min, no murmur appreciated, S1 and S2 present Abdomen:  soft, non-tender, no distention, no masses, no guarding, no rigidity, and bowel sounds hypoactive.   Extremities:  difficult to appreciate dorsalis pedis due to swelling, PT pulses present and equal bilaterally, +2 pitting edema extending to knees Neurologic:  alert & oriented X3 and cranial nerves II-XII intact, difficult to test strenght on dorsiflexion and plantarflexion due to pain   Impression & Recommendations:  Problem # 1:  EDEMA (ICD-782.3) Etiology wide and includes CHF exacerbation and this is unlikely given her PE findings, unlikely DVT since bilateral and no calf tenderness, most likely secondary to dietary noncompliance and this has been documented on previous visits because pt lives with her son and since she is blind she can not cook for herself so she depends on him for  that. I will increase her dose of Lasix to 80 mg once daily and will see her back in one week. Will check bmet on next visit and I have also discussed keeping legs elevated as tolerating to encourage flud removal from her LE. Discussed the case with Dr. Rogelia Boga. Her updated medication list for this problem includes:    Lasix 40 Mg Tabs (Furosemide) .Marland Kitchen... Take 2 tablets once daily  Complete Medication List: 1)  Glipizide 10 Mg Xr24h-tab (Glipizide) .... Take 1 tablet by mouth once a day 2)  Lasix 40 Mg Tabs (Furosemide) .... Take 2 tablets once daily 3)  Norvasc 10 Mg Tabs (Amlodipine besylate) .... Take 1 tablet by mouth once a day 4)  Aspirin 81 Mg Tbec (Aspirin) .... Take 1 tablet by mouth once a day 5)  Carvedilol 25 Mg Tabs (Carvedilol) .... Take 1 tablet by mouth two times a day 6)  Cozaar 25 Mg Tabs (Losartan potassium) .... Take 1 tablet by mouth once a day 7)  Zocor 20 Mg Tabs (Simvastatin) .... Take 1 tablet by mouth once a day 8)  Nexium 40 Mg Cpdr (Esomeprazole magnesium) .... Take 1 tablet by mouth once a day 9)  Brimonidine Tartrate 0.15 % Soln (Brimonidine tartrate) .... As directed 10)  Timolol Maleate 0.5 % Solg (Timolol maleate) .... As directed 11)  Metformin Hcl 500 Mg Tabs (Metformin hcl) .... Take 1 tablet by mouth two times a day  Other Orders: Capillary Blood Glucose/CBG [phone]) Social Work Referral (Social )  Patient Instructions: 1)  Please schedule a follow-up appointment in 2 weeks or sooner if your symptoms get worse or do not get better. Prescriptions: LASIX 40 MG TABS (FUROSEMIDE) take 2 tablets once daily  #14 x 0   Entered and Authorized by:   Mliss Sax MD   Signed by:   Mliss Sax MD on 10/13/2010   Method used:   Electronically to        [facility] [id number] (retail)       [address] [place], [place]  [address]       Ph: [phone]       Fax: [phone]   RxID:   [id number]    Orders Added: 1)   Capillary Blood Glucose/CBG [44010] 2)  Est. Patient Level II [27253] 3)  Social Work Referral Blush.[email or web address] ] 4)  Est. Patient Level II [id number]     Prevention & Chronic Care Immunizations   Influenza  vaccine: Not documented   Influenza vaccine deferral: Not indicated  (10/13/2010)    Tetanus booster: Not documented   Td booster deferral: Not indicated  (10/13/2010)    Pneumococcal vaccine: Not documented   Pneumococcal vaccine deferral: Not indicated  (10/13/2010)    H. zoster vaccine: Not documented   H. zoster vaccine deferral: Not indicated  (10/13/2010)  Colorectal Screening   Hemoccult: Not documented   Hemoccult action/deferral: Refused  (10/13/2010)    Colonoscopy: Not documented   Colonoscopy action/deferral: GI referral  (07/02/2009)  Other Screening   Pap smear: Not documented   Pap smear action/deferral: Refused  (10/13/2010)    Mammogram: No specific mammographic evidence of malignancy.  Assessment: BIRADS 1.   (07/19/2009)   Mammogram action/deferral: Ordered  (07/02/2009)    DXA bone density scan: Not documented   DXA bone density action/deferral: Not indicated  (10/13/2010)   Smoking status: quit  (10/13/2010)  Diabetes Mellitus   HgbA1C: 9.9  (09/11/2010)    Eye exam: Proliferative diabetic retinopathy.   Tractional retinal detachment.   Cataract.     (01/28/2009)   Diabetic eye exam action/deferral: Not indicated  (10/13/2010)   Eye exam due: 01/2010    Foot exam: yes  (09/11/2010)   High risk foot: Yes  (01/03/2008)   Foot care education: Done  (05/30/2007)   Foot exam due: 01/02/2009    Urine microalbumin/creatinine ratio: 22.5  (07/02/2009)   Urine microalbumin action/deferral: Ordered    Diabetes flowsheet reviewed?: Yes   Progress toward A1C goal: Deteriorated  Lipids   Total Cholesterol: 190  (09/11/2010)   Lipid panel action/deferral: Lipid Panel ordered   LDL: 118  (09/11/2010)   LDL Direct: Not documented   HDL: 43   (09/11/2010)   Triglycerides: 144  (09/11/2010)    SGOT (AST): 11  (09/11/2010)   SGPT (ALT): 12  (09/11/2010)   Alkaline phosphatase: 105  (09/11/2010)   Total bilirubin: 0.5  (09/11/2010)    Lipid flowsheet reviewed?: Yes   Progress toward LDL goal: Deteriorated  Hypertension   Last Blood Pressure: 137 / 63  (10/13/2010)   Serum creatinine: 1.19  (09/11/2010)   Serum potassium 3.8  (09/11/2010)    Hypertension flowsheet reviewed?: Yes   Progress toward BP goal: Improved  Self-Management Support :   Personal Goals (by the next clinic visit) :     Personal A1C goal: 7  (10/13/2010)     Personal blood pressure goal: 130/80  (10/13/2010)     Personal LDL goal: 100  (10/13/2010)    Patient will work on the following items until the next clinic visit to reach self-care goals:     Medications and monitoring: take my medicines every day, bring all of my medications to every visit, examine my feet every day  (10/13/2010)     Eating: drink diet soda or water instead of juice or soda, eat more vegetables, eat foods that are low in salt, eat baked foods instead of fried foods, eat fruit for snacks and desserts, limit or avoid alcohol  (10/13/2010)     Activity: join a walking program  (10/13/2010)     Other: walks in house - grand dtr checks sugars sometimes  (10/13/2010)    Diabetes self-management support: Pre-printed educational material, Resources for patients handout, Written self-care plan  (10/13/2010)   Diabetes care plan printed    Hypertension self-management support: Pre-printed educational material, Resources for patients handout, Written self-care plan  (10/13/2010)   Hypertension self-care plan printed.  Lipid self-management support: Pre-printed educational material, Resources for patients handout, Written self-care plan  (10/13/2010)   Lipid self-care plan printed.      Resource handout printed.

## 2011-01-20 NOTE — Letter (Signed)
Summary: BLOOD GLUCOSE06-17-2011/10-02-2010  BLOOD GLUCOSE06-17-2011/10-02-2010   Imported By: Margie Billet 10/06/2010 14:59:15  _____________________________________________________________________  External Attachment:    Type:   Image     Comment:   External Document

## 2011-01-20 NOTE — Letter (Signed)
Summary: [facility]  [facility]   Imported By: Shon Hough 08/26/2010 11:19:28  _____________________________________________________________________  External Attachment:    Type:   Image     Comment:   External Document

## 2011-01-20 NOTE — Assessment & Plan Note (Signed)
Summary: ACUTE/GOLDING/1 WEEK F/U VISIT PER [facility]   Vital Signs:  Patient profile:   75 year old female Height:      64 inches (162.56 cm) Weight:      260.8 pounds (118.55 kg) BMI:     44.93 Temp:     97.8 degrees F (36.56 degrees C) oral Pulse rate:   77 / minute BP sitting:   135 / 69  (left arm) Cuff size:   large  Vitals Entered By: Theotis Barrio NT II (October 20, 2010 10:17 AM) CC: DM  / TO SEE DONNA R. /  WANTS FLU SHOT/ PATIENT IS BLIND / ? MEDICATION REFILL Is Patient Diabetic? Yes Did you bring your meter with you today? Yes Pain Assessment Patient in pain? no      Nutritional Status BMI of > 30 = obese CBG Result 157  Have you ever been in a relationship where you felt threatened, hurt or afraid?No   Does patient need assistance? Functional Status Self care Ambulation Impaired:Risk for fall Comments HAS HELP FROM HER GRANDDAUGHTER / PATIENT IS BLIND   Primary Care Provider:  Julaine Fusi  DO  CC:  DM  / TO SEE DONNA R. /  WANTS FLU SHOT/ PATIENT IS BLIND / ? MEDICATION REFILL.  History of Present Illness: 75 yr old woman with pmhx as described below comes to the clinic for 1 week follow up of lower extremity swelling. Edema is improved. Denies calf pain. Patient is working to improve her diet and decrease salt intake.   Patient has no complains.   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) 2)  Onychomycosis  (ICD-110.1) 3)  Osteoarthritis, Wrist, Left  (ICD-715.13) 4)  Abdominal Distension  (ICD-787.3) 5)  Edema  (ICD-782.3) 6)  Cardiomyopathy, Secondary  (ICD-425.9) 7)  Proliferative Diabetic Retinopathy  (ICD-362.02) 8)  Glaucoma  (ICD-365.9) 9)  Coronary Artery Disease  (ICD-414.00) 10)  Intermittent Vertigo  (ICD-780.4) 11)   Allergic Rhinitis  (ICD-477.9) 12)  Congestive Heart Failure, Moderate  (ICD-428.0) 13)  Hx of Gout Nos  (ICD-274.9) 14)  Renal Insufficiency, Chronic  (ICD-585.9) 15)  Hypertension  (ICD-401.9) 16)  Hyperlipidemia  (ICD-272.4) 17)  Diabetes Mellitus, Type II  (ICD-250.00)  Medications Prior to Update: 1)  Glipizide 10 Mg Xr24h-Tab (Glipizide) .... Take 1 Tablet By Mouth Once A Day 2)  Lasix 40 Mg Tabs (Furosemide) .... Take 2 Tablets Once Daily 3)  Norvasc 10 Mg Tabs (Amlodipine Besylate) .... Take 1 Tablet By Mouth Once A Day 4)  Aspirin 81 Mg Tbec (Aspirin) .... Take 1 Tablet By Mouth Once A Day 5)  Carvedilol 25 Mg Tabs (Carvedilol) .... Take 1 Tablet By Mouth Two Times A Day 6)  Cozaar 25 Mg  Tabs (Losartan Potassium) .... Take 1 Tablet By Mouth Once A Day 7)  Zocor 20 Mg  Tabs (Simvastatin) .... Take 1 Tablet By Mouth Once A Day 8)  Nexium 40 Mg Cpdr (Esomeprazole Magnesium) .... Take 1 Tablet By Mouth Once A Day 9)  Brimonidine Tartrate 0.15 % Soln (Brimonidine Tartrate) .... As Directed 10)  Timolol Maleate 0.5 % Solg (Timolol Maleate) .... As Directed 11)  Metformin Hcl 500 Mg Tabs (Metformin Hcl) .... Take 1 Tablet By Mouth Two Times A  Day  Current Medications (verified): 1)  Glipizide 10 Mg Xr24h-Tab (Glipizide) .... Take 1 Tablet By Mouth Once A Day 2)  Lasix 40 Mg Tabs (Furosemide) .... Take 2 Tablets Once Daily 3)  Norvasc 10 Mg Tabs (Amlodipine Besylate) .... Take 1 Tablet By Mouth Once A Day 4)  Aspirin 81 Mg Tbec (Aspirin) .... Take 1 Tablet By Mouth Once A Day 5)  Carvedilol 25 Mg Tabs (Carvedilol) .... Take 1 Tablet By Mouth Two Times A Day 6)  Cozaar 25 Mg  Tabs (Losartan Potassium) .... Take 1 Tablet By Mouth Once A Day 7)  Zocor 20 Mg  Tabs (Simvastatin) .... Take 1 Tablet By Mouth Once A Day 8)  Nexium 40 Mg Cpdr (Esomeprazole Magnesium) .... Take 1 Tablet By Mouth Once A Day 9)  Brimonidine Tartrate 0.15 % Soln (Brimonidine Tartrate) .... As Directed 10)   Timolol Maleate 0.5 % Solg (Timolol Maleate) .... As Directed 11)  Metformin Hcl 500 Mg Tabs (Metformin Hcl) .... Take 1 Tablet By Mouth Two Times A Day  Allergies: 1)  ! * Lisinopril  Past History:  Past Medical History: Last updated: 05/17/2009 EDEMA (ICD-782.3) CARDIOMYOPATHY, SECONDARY (ICD-425.9): normal cath 2007 EF 30-35% echo 2009 PROLIFERATIVE DIABETIC RETINOPATHY (ICD-362.02) GLAUCOMA (ICD-365.9) CORONARY ARTERY DISEASE (ICD-414.00) INTERMITTENT VERTIGO (ICD-780.4) ALLERGIC RHINITIS (ICD-477.9) CONGESTIVE HEART FAILURE, MODERATE (ICD-428.0) Hx of GOUT NOS (ICD-274.9) RENAL INSUFFICIENCY, CHRONIC (ICD-585.9) HYPERTENSION (ICD-401.9) HYPERLIPIDEMIA (ICD-272.4) DIABETES MELLITUS, TYPE II (ICD-250.00)  Past Surgical History: Last updated: 05/17/2009  Posterior vitrectomy with membrane peel  Surgical posterior capsulotomy  Family History: Last updated: 01/03/2008 Mother: living, 20 (Alzheimer's disease) Father: living, 80 (prostate cancer) Siblings: 2 brothers, 2 sisters (sister has "leg" problems) Children: 5 (healthy, but several smoke)  Social History: Last updated: 01/03/2008 Son lives with her.  Lives in [place].  Widowed.  Smoked >40y ago.  No alcohol. No drug use.  Worked caring for the elderly for 38 years.  Risk Factors: Alcohol Use: 0 (10/20/2010) Exercise: no (10/20/2010)  Risk Factors: Smoking Status: quit (10/20/2010)  Family History: Reviewed history from 01/03/2008 and no changes required. Mother: living, 9 (Alzheimer's disease) Father: living, 39 (prostate cancer) Siblings: 2 brothers, 2 sisters (sister has "leg" problems) Children: 5 (healthy, but several smoke)  Social History: Reviewed history from 01/03/2008 and no changes required. Son lives with her.  Lives in [place].  Widowed.  Smoked >40y ago.  No alcohol. No drug use.  Worked caring for the elderly for 38 years.Does Patient Exercise:  no  Review of Systems  The patient  denies fever, chest pain, dyspnea on exertion, hemoptysis, abdominal pain, melena, hematochezia, hematuria, unusual weight change, and abnormal bleeding.    Physical Exam  General:  NAD, vitals reviewed.  Mouth:  MMM Neck:  supple.   Lungs:  no intercostal retractions, no accessory muscle use, and normal breath sounds.   Heart:  distant heart sounds, no murmur appreciated, S1 and S2 present Abdomen:  soft, non-tender, no distention, no masses, no guarding, no rigidity, and bowel sounds hypoactive.   Msk:  no joint swelling, no joint warmth, and no redness over joints.   Extremities:  trace edema bilaterally Neurologic:  alert & oriented X3.     Impression & Recommendations:  Problem # 1:  EDEMA (ICD-782.3) Improved on current regimen. Check bmet. If electrolytes and renal function wnl will resume current dosage, otherwise will decrease lasix to prior dose. Recheck bmet on follow up.  Her updated medication list for this problem includes:  Lasix 40 Mg Tabs (Furosemide) .Marland Kitchen... Take 2 tablets once daily  Orders: T-Basic Metabolic Panel [id number])  Problem # 2:  HYPERTENSION (ICD-401.9) Not at goal, but patient has not taking her medication today. Will resume current regimen for now. Consider increasing Losartan on follow up if BP continues to be above goal.   Her updated medication list for this problem includes:    Lasix 40 Mg Tabs (Furosemide) .Marland Kitchen... Take 2 tablets once daily    Norvasc 10 Mg Tabs (Amlodipine besylate) .Marland Kitchen... Take 1 tablet by mouth once a day    Carvedilol 25 Mg Tabs (Carvedilol) .Marland Kitchen... Take 1 tablet by mouth two times a day    Cozaar 25 Mg Tabs (Losartan potassium) .Marland Kitchen... Take 1 tablet by mouth once a day  BP today: 135/69 Prior BP: 137/63 (10/13/2010)  Prior 10 Yr Risk Heart Disease: N/A (03/04/2009)  Labs Reviewed: K+: 3.8 (09/11/2010) Creat: : 1.19 (09/11/2010)   Chol: 190 (09/11/2010)   HDL: 43 (09/11/2010)   LDL: 118 (09/11/2010)   TG: 144  (09/11/2010)  Problem # 3:  HYPERLIPIDEMIA (ICD-272.4) LDL not at goal. Patient on max dose of Zocor per interaction with Norvasc. Will recheck FLP 3 months and if needed could change zocor to lipitor.  Her updated medication list for this problem includes:    Zocor 20 Mg Tabs (Simvastatin) .Marland Kitchen... Take 1 tablet by mouth once a day  Labs Reviewed: SGOT: 11 (09/11/2010)   SGPT: 12 (09/11/2010)  Prior 10 Yr Risk Heart Disease: N/A (03/04/2009)   HDL:43 (09/11/2010), 47 (02/03/2010)  LDL:118 (09/11/2010), 106 (02/03/2010)  Chol:190 (09/11/2010), 180 (02/03/2010)  Trig:144 (09/11/2010), 134 (02/03/2010)  Problem # 4:  DIABETES MELLITUS, TYPE II (ICD-250.00) Uncontrolled. Patient saw Lupita Leash today. Further management per PCP.   Her updated medication list for this problem includes:    Glipizide 10 Mg Xr24h-tab (Glipizide) .Marland Kitchen... Take 1 tablet by mouth once a day    Aspirin 81 Mg Tbec (Aspirin) .Marland Kitchen... Take 1 tablet by mouth once a day    Cozaar 25 Mg Tabs (Losartan potassium) .Marland Kitchen... Take 1 tablet by mouth once a day    Metformin Hcl 500 Mg Tabs (Metformin hcl) .Marland Kitchen... Take 1 tablet by mouth two times a day  Orders: T-Urine Microalbumin w/creat. ratio [id number]) Capillary Blood Glucose/CBG [phone])  Labs Reviewed: Creat: 1.19 (09/11/2010)     Last Eye Exam: Proliferative diabetic retinopathy.   Tractional retinal detachment.   Cataract.    (01/28/2009) Reviewed HgBA1c results: 9.9 (09/11/2010)  6.4 (02/03/2010)  Complete Medication List: 1)  Glipizide 10 Mg Xr24h-tab (Glipizide) .... Take 1 tablet by mouth once a day 2)  Lasix 40 Mg Tabs (Furosemide) .... Take 2 tablets once daily 3)  Norvasc 10 Mg Tabs (Amlodipine besylate) .... Take 1 tablet by mouth once a day 4)  Aspirin 81 Mg Tbec (Aspirin) .... Take 1 tablet by mouth once a day 5)  Carvedilol 25 Mg Tabs (Carvedilol) .... Take 1 tablet by mouth two times a day 6)  Cozaar 25 Mg Tabs (Losartan potassium) .... Take 1 tablet by  mouth once a day 7)  Zocor 20 Mg Tabs (Simvastatin) .... Take 1 tablet by mouth once a day 8)  Nexium 40 Mg Cpdr (Esomeprazole magnesium) .... Take 1 tablet by mouth once a day 9)  Brimonidine Tartrate 0.15 % Soln (Brimonidine tartrate) .... As directed 10)  Timolol Maleate 0.5 % Solg (Timolol maleate) .... As directed 11)  Metformin Hcl 500 Mg Tabs (Metformin hcl) .... Take 1 tablet  by mouth two times a day  Patient Instructions: 1)  Please schedule a follow-up appointment in 1 month. 2)  Take all medication as directed. 3)  You will be called with any abnormalities in the tests scheduled or performed today.  If you don't hear from [place] within a week from when the test was performed, you can assume that your test was normal.  Prescriptions: NEXIUM 40 MG CPDR (ESOMEPRAZOLE MAGNESIUM) Take 1 tablet by mouth once a day  #30 x 3   Entered and Authorized by:   Laren Everts MD   Signed by:   Laren Everts MD on 10/20/2010   Method used:   Electronically to        [facility] [id number] (retail)       [address] [place], [place]  [address]       Ph: [phone]       Fax: [phone]   RxID:   [id number]    Orders Added: 1)  T-Urine Microalbumin w/creat. ratio [[id number]] 2)  T-Basic Metabolic Panel [id number] 3)  Capillary Blood Glucose/CBG [82948] 4)  Est. Patient Level III [[address]]    Prevention & Chronic Care Immunizations   Influenza vaccine: Not documented   Influenza vaccine deferral: Not indicated  (10/13/2010)    Tetanus booster: Not documented   Td booster deferral: Not indicated  (10/13/2010)    Pneumococcal vaccine: Not documented   Pneumococcal vaccine deferral: Not indicated  (10/13/2010)    H. zoster vaccine: Not documented   H. zoster vaccine deferral: Not indicated  (10/13/2010)  Colorectal Screening   Hemoccult: Not documented   Hemoccult action/deferral: Refused  (10/13/2010)     Colonoscopy: Not documented   Colonoscopy action/deferral: GI referral  (07/02/2009)  Other Screening   Pap smear: Not documented   Pap smear action/deferral: Refused  (10/13/2010)    Mammogram: No specific mammographic evidence of malignancy.  Assessment: BIRADS 1.   (07/19/2009)   Mammogram action/deferral: Ordered  (07/02/2009)    DXA bone density scan: Not documented   DXA bone density action/deferral: Not indicated  (10/13/2010)   Smoking status: quit  (10/20/2010)  Diabetes Mellitus   HgbA1C: 9.9  (09/11/2010)    Eye exam: Proliferative diabetic retinopathy.   Tractional retinal detachment.   Cataract.     (01/28/2009)   Diabetic eye exam action/deferral: Not indicated  (10/13/2010)   Eye exam due: 01/2010    Foot exam: yes  (09/11/2010)   High risk foot: Yes  (01/03/2008)   Foot care education: Done  (05/30/2007)   Foot exam due: 01/02/2009    Urine microalbumin/creatinine ratio: 22.5  (07/02/2009)   Urine microalbumin action/deferral: Ordered    Diabetes flowsheet reviewed?: Yes   Progress toward A1C goal: Unchanged  Lipids   Total Cholesterol: 190  (09/11/2010)   Lipid panel action/deferral: Lipid Panel ordered   LDL: 118  (09/11/2010)   LDL Direct: Not documented   HDL: 43  (09/11/2010)   Triglycerides: 144  (09/11/2010)    SGOT (AST): 11  (09/11/2010)   SGPT (ALT): 12  (09/11/2010)   Alkaline phosphatase: 105  (09/11/2010)   Total bilirubin: 0.5  (09/11/2010)    Lipid flowsheet reviewed?: Yes   Progress toward LDL goal: Unchanged  Hypertension   Last Blood Pressure: 135 / 69  (10/20/2010)   Serum creatinine: 1.19  (09/11/2010)   Serum potassium 3.8  (09/11/2010)    Hypertension  flowsheet reviewed?: Yes   Progress toward BP goal: Unchanged  Self-Management Support :   Personal Goals (by the next clinic visit) :     Personal A1C goal: 7  (10/13/2010)     Personal blood pressure goal: 130/80  (10/13/2010)     Personal LDL goal: 100   (10/13/2010)    Patient will work on the following items until the next clinic visit to reach self-care goals:     Medications and monitoring: take my medicines every day, check my blood sugar, bring all of my medications to every visit, examine my feet every day  (10/20/2010)     Eating: drink diet soda or water instead of juice or soda, eat more vegetables, use fresh or frozen vegetables, eat foods that are low in salt, eat baked foods instead of fried foods, eat fruit for snacks and desserts, limit or avoid alcohol  (10/20/2010)     Activity: join a walking program  (10/13/2010)     Other: walks in house - grand dtr checks sugars sometimes  (10/13/2010)    Diabetes self-management support: Resources for patients handout, Written self-care plan  (10/20/2010)   Diabetes care plan printed    Hypertension self-management support: Resources for patients handout, Written self-care plan  (10/20/2010)   Hypertension self-care plan printed.    Lipid self-management support: Resources for patients handout, Written self-care plan  (10/20/2010)   Lipid self-care plan printed.      Resource handout printed.  Process Orders Check Orders Results:     [facility]: Check successful Tests Sent for requisitioning (October 20, 2010 11:51 AM):     10/20/2010: [facility] -- T-Urine Microalbumin w/creat. ratio [[id number]] (signed)     10/20/2010: [facility] -- T-Basic Metabolic Panel [phone] (signed)

## 2011-01-20 NOTE — Assessment & Plan Note (Signed)
Summary: diabetes/dmr  CBG Result 120 CBG Device ID her contour meter Comments thsi was  ~ 3 hour after 2 packs sugared oatmeal   Allergies: 1)  ! * Lisinopril   Complete Medication List: 1)  Glipizide 10 Mg Xr24h-tab (Glipizide) .... Take 1 tablet by mouth once a day 2)  Lasix 40 Mg Tabs (Furosemide) .... Take 1and 1/2  tablets by mouth once a day 3)  Norvasc 10 Mg Tabs (Amlodipine besylate) .... Take 1 tablet by mouth once a day 4)  Aspirin 81 Mg Tbec (Aspirin) .... Take 1 tablet by mouth once a day 5)  Carvedilol 25 Mg Tabs (Carvedilol) .... Take 1 tablet by mouth two times a day 6)  Cozaar 25 Mg Tabs (Losartan potassium) .... Take 1 tablet by mouth once a day 7)  Zocor 20 Mg Tabs (Simvastatin) .... Take 1 tablet by mouth once a day 8)  Nexium 40 Mg Cpdr (Esomeprazole magnesium) .... Take 1 tablet by mouth once a day 9)  Brimonidine Tartrate 0.15 % Soln (Brimonidine tartrate) .... As directed 10)  Timolol Maleate 0.5 % Solg (Timolol maleate) .... As directed 11)  Metformin Hcl 500 Mg Tabs (Metformin hcl) .... Take 1 tablet by mouth two times a day  Other Orders: MNT/Initial Visit and Intervention, 15 minutes ([address])  Patient Instructions: 1)  Please be sure to eat three times every day- every 4- 6 hours 2)  when you wake up- use 1 pack regular oatmeal and 1 pack flavored oatmeal or Old Fashioned Oats 3)  lunch - use light or fat free bologna, [place], ham or tuna sald with lowfat mayonaise 4)  dinner- baked meats, half a plate fo veggies nad a starch 5)  Fruit is a great dessert.Marland Kitchen 6)   Make an appointment for 3-4 weeks 7)  Before you return please check blood sugars before each meal and bedtime for at least one day and record on sheet. MEDICAL NUTRITION THERAPY  Assessment: Patietn and daughter present for asistance in improving diabetes control. Family- son and grandaughter are sharing responsibility of assisting aptient with self care activities. Patient reports that she had  been eatig high fat and sugared foods over the summer months, but is trying to change this and desires better understanding of how food affects blood sugars.   Wt: stable and excess obesity Medications:glipizide and metformin- tolerating without problems Blood sugars:see download- doesn';t test very often, reports symptoms of hypoglycemia this morning- didin;t eat after 2 pm yesterday to try to help her decrease her weight 24 hour recall: 2 packs instant sugars oatmeal, bologna sandwich Exercise:limited due to blindness Labs:A1C 9.9 increased form > 7% Estimated needs: ~ 1500 calories & 45-50 g carb/meal with 15-20 gram snacks ass needed  Diagnosis:  NB 1.4-Food and Nutrtion related knowlege deficit as related to desire to change food intake ot improve diabetes control as evidenced by theri questions, level on interst and report.  NB 1.1 -self monitoring deficit  as related to patient dependent on others for self testing and lack of knoweldge of when to test (also resides at two homes nad sometimes doesn;t have her meter with her)  as evidenced by lack of blood sugars on meter download and reports of symptoms of hypoglycemia   Intervention:  1- Educated about importacne of carbs and spreadign them out to control blood sugar 2- Educated about purpose and goals for self blood glucose testing 3- Introduced granddaughter and patient to basal insulin and how insulin pen works 4- Educated  about hypoglycemia- prevention an proper treatment  Monitoring: verbalizes understanding of how foods affect diabetes and purpose of self monitoring Evaluation: A1C, weight  Follow-up:3-4 weeks

## 2011-01-20 NOTE — Assessment & Plan Note (Signed)
Summary: f44m/jml  Medications Added OMEPRAZOLE 20 MG CPDR (OMEPRAZOLE) 1 tab by mouth once daily BRIMONIDINE TARTRATE 0.15 % SOLN (BRIMONIDINE TARTRATE) as directed TIMOLOL MALEATE 0.5 % SOLG (TIMOLOL MALEATE) as directed      Allergies Added:   Primary Provider:  Julaine Fusi  DO   History of Present Illness: [name] is seen today for f/u of DCM with EF 30-35%.  She was recently hospitalized with mild volume overlaod and ? bronchits with atypical SSCP.  She R/O BNP was only 146.  Since D/C on Lasix 60mg  /day she has done well No SSCP, dyspnea, orthopnea continued mild chronic LE edema.  She has been compliant with her meds.  Current Problems (verified): 1)  Osteoarthritis, Wrist, Left  (ICD-715.13) 2)  Abdominal Distension  (ICD-787.3) 3)  Edema  (ICD-782.3) 4)  Cardiomyopathy, Secondary  (ICD-425.9) 5)  Proliferative Diabetic Retinopathy  (ICD-362.02) 6)  Glaucoma  (ICD-365.9) 7)  Coronary Artery Disease  (ICD-414.00) 8)  Intermittent Vertigo  (ICD-780.4) 9)  Allergic Rhinitis  (ICD-477.9) 10)  Congestive Heart Failure, Moderate  (ICD-428.0) 11)  Hx of Gout Nos  (ICD-274.9) 12)  Renal Insufficiency, Chronic  (ICD-585.9) 13)  Hypertension  (ICD-401.9) 14)  Hyperlipidemia  (ICD-272.4) 15)  Diabetes Mellitus, Type II  (ICD-250.00)  Current Medications (verified): 1)  Glipizide 10 Mg Tabs (Glipizide) .... Take 1 Tablet By Mouth Two Times A Day 2)  Lasix 40 Mg Tabs (Furosemide) .... Take 1and 1/2  Tablets By Mouth Once A Day 3)  Norvasc 10 Mg Tabs (Amlodipine Besylate) .... Take 1 Tablet By Mouth Once A Day 4)  Aspirin 81 Mg Tbec (Aspirin) .... Take 1 Tablet By Mouth Once A Day 5)  Coreg 12.5 Mg  Tabs (Carvedilol) .... Take 1 Tablet By Mouth Two Times A Day 6)  Cozaar 25 Mg  Tabs (Losartan Potassium) .... Take 1 Tablet By Mouth Once A Day 7)  Zocor 20 Mg  Tabs (Simvastatin) .... Take 1 Tablet By Mouth Once A Day 8)  Voltaren 1 % Gel (Diclofenac Sodium) .... Apply To Painful  Area Two Times A Day As Needed For Pain 9)  Rollator  Misc (Misc. Devices) .... Rollator With A Seat Dx: Glaucoma, Osteoarthritis 10)  Omeprazole 20 Mg Cpdr (Omeprazole) .Marland Kitchen.. 1 Tab By Mouth Once Daily 11)  Brimonidine Tartrate 0.15 % Soln (Brimonidine Tartrate) .... As Directed 12)  Timolol Maleate 0.5 % Solg (Timolol Maleate) .... As Directed  Allergies (verified): 1)  ! * Lisinopril  Past History:  Past Medical History: Last updated: 05/17/2009 EDEMA (ICD-782.3) CARDIOMYOPATHY, SECONDARY (ICD-425.9): normal cath 2007 EF 30-35% echo 2009 PROLIFERATIVE DIABETIC RETINOPATHY (ICD-362.02) GLAUCOMA (ICD-365.9) CORONARY ARTERY DISEASE (ICD-414.00) INTERMITTENT VERTIGO (ICD-780.4) ALLERGIC RHINITIS (ICD-477.9) CONGESTIVE HEART FAILURE, MODERATE (ICD-428.0) Hx of GOUT NOS (ICD-274.9) RENAL INSUFFICIENCY, CHRONIC (ICD-585.9) HYPERTENSION (ICD-401.9) HYPERLIPIDEMIA (ICD-272.4) DIABETES MELLITUS, TYPE II (ICD-250.00)  Past Surgical History: Last updated: 05/17/2009  Posterior vitrectomy with membrane peel  Surgical posterior capsulotomy  Family History: Last updated: 01/03/2008 Mother: living, 29 (Alzheimer's disease) Father: living, 36 (prostate cancer) Siblings: 2 brothers, 2 sisters (sister has "leg" problems) Children: 5 (healthy, but several smoke)  Social History: Last updated: 01/03/2008 Son lives with her.  Lives in [place].  Widowed.  Smoked >40y ago.  No alcohol. No drug use.  Worked caring for the elderly for 38 years.  Review of Systems       Denies fever, malais, weight loss, blurry vision, decreased visual acuity, cough, sputum, SOB, hemoptysis, pleuritic pain, palpitaitons, heartburn, abdominal pain, melena,  lower extremity claudication, or rash.   Vital Signs:  Patient profile:   75 year old female Height:      64 inches Weight:      263 pounds BMI:     45.31 Pulse rate:   82 / minute Resp:     12 per minute BP sitting:   143 / 72  (left arm)  Vitals  Entered By: Kem Parkinson (March 19, 2010 12:11 PM)  Physical Exam  General:  Affect appropriate Healthy:  appears stated age HEENT: normal Neck supple with no adenopathy JVP normal no bruits no thyromegaly Lungs clear with no wheezing and good diaphragmatic motion Heart:  S1/S2 no murmur,rub, gallop or click PMI normal Abdomen: benighn, BS positve, no tenderness, no AAA no bruit.  No HSM or HJR Distal pulses intact with no bruits Plus one  edema Neuro non-focal Skin warm and dry    Impression & Recommendations:  Problem # 1:  CARDIOMYOPATHY, SECONDARY (ICD-425.9) Euvolemic with normal cath 2007 continue current dose diuretic and meds.  Encouraged to get a scale  and know dry weight Her updated medication list for this problem includes:    Lasix 40 Mg Tabs (Furosemide) .Marland Kitchen... Take 1and 1/2  tablets by mouth once a day    Norvasc 10 Mg Tabs (Amlodipine besylate) .Marland Kitchen... Take 1 tablet by mouth once a day    Aspirin 81 Mg Tbec (Aspirin) .Marland Kitchen... Take 1 tablet by mouth once a day    Coreg 12.5 Mg Tabs (Carvedilol) .Marland Kitchen... Take 1 tablet by mouth two times a day    Cozaar 25 Mg Tabs (Losartan potassium) .Marland Kitchen... Take 1 tablet by mouth once a day  Problem # 2:  HYPERTENSION (ICD-401.9) Well controlled.  Low sodium diet Her updated medication list for this problem includes:    Lasix 40 Mg Tabs (Furosemide) .Marland Kitchen... Take 1and 1/2  tablets by mouth once a day    Norvasc 10 Mg Tabs (Amlodipine besylate) .Marland Kitchen... Take 1 tablet by mouth once a day    Aspirin 81 Mg Tbec (Aspirin) .Marland Kitchen... Take 1 tablet by mouth once a day    Coreg 12.5 Mg Tabs (Carvedilol) .Marland Kitchen... Take 1 tablet by mouth two times a day    Cozaar 25 Mg Tabs (Losartan potassium) .Marland Kitchen... Take 1 tablet by mouth once a day  Problem # 3:  HYPERLIPIDEMIA (ICD-272.4) At target with no side effects Her updated medication list for this problem includes:    Zocor 20 Mg Tabs (Simvastatin) .Marland Kitchen... Take 1 tablet by mouth once a day  CHOL: 180  (02/03/2010)   LDL: 106 (02/03/2010)   HDL: 47 (02/03/2010)   TG: 134 (02/03/2010)  Patient Instructions: 1)  Your physician recommends that you schedule a follow-up appointment in: 6 MONTHS WITH DR [name] 2)  Your physician recommends that you continue on your current medications as directed. Please refer to the Current Medication list given to you today.

## 2011-01-20 NOTE — Consult Note (Signed)
Summary: [facility]   Imported By: Margie Billet 06/18/2010 15:03:56  _____________________________________________________________________  External Attachment:    Type:   Image     Comment:   External Document

## 2011-01-20 NOTE — Progress Notes (Signed)
Summary: phone/gg  Phone Note Call from Patient   Caller: Patient Summary of Call: Pt called with c/o both feet and ankles swelling,  onset Friday.  Hurts to stand.  She thinks it's gout. Also the metformin is making her cough at night and wants it changed. I told pt I don't think it's gout and needs evaluation for this swelling.   Can see today at 1430. Initial call taken by: Merrie Roof RN,  October 13, 2010 12:22 PM  Follow-up for Phone Call        She is already scheduled with Dr. Aldine Contes at that time.    That is fine. Follow-up by: Mariea Stable MD,  October 13, 2010 12:33 PM

## 2011-01-20 NOTE — Assessment & Plan Note (Signed)
Summary: Soc. Work  Met with Ruben Gottron, granddaughter [phone]) in my office.  She is living with her [place] but going to her grandmother's home about 3 times per day to help her manage her sugars and medications. Right now she is not working but strictly taking care of her Uncle and grandmother. Grandmother is blind and needs much assistance with ADL's.    Both she and the uncle who the grandmother lives with help with preparation of meals, bathing and dressing.  Advised granddaughter that since gma does not have Medicaid we cannot get her an aid in the home.    Discussed [employer] program and that grandmother's extra pension check would cause her to pay out of pocket for the service.  Glee Arvin said that the family has pulled together and they are managing pretty well. Grandma had a good visit today in terms of her diabetes.   She has my card for any future problems or concerns as they arise.

## 2011-01-20 NOTE — Miscellaneous (Signed)
Summary: [facility]  [facility]   Imported By: Florinda Marker 03/11/2010 14:29:47  _____________________________________________________________________  External Attachment:    Type:   Image     Comment:   External Document

## 2011-01-20 NOTE — Assessment & Plan Note (Signed)
Summary: EST-CK/FU/MEDS/CFB   Vital Signs:  Patient profile:   75 year old female Height:      64 inches (162.56 cm) Weight:      262 pounds (119.09 kg) BMI:     45.13 Temp:     99 degrees F (37.22 degrees C) oral Pulse rate:   91 / minute BP sitting:   148 / 78  (right arm)  Vitals Entered By: Angelina Ok RN (September 11, 2010 10:10 AM) CC: Depression Is Patient Diabetic? Yes Did you bring your meter with you today? No Pain Assessment Patient in pain? no     Location: head Nutritional Status BMI of > 30 = obese  Have you ever been in a relationship where you felt threatened, hurt or afraid?No   Does patient need assistance? Functional Status Self care Ambulation Impaired:Risk for fall Comments Pt came in wheelchair.  uses a walker at home.  Having hallucinations she thinks from meds.  Problems with her stomach.  Has had some testong.  Needs potassium.  Stromach feeling tight.  Only taking 1` heart pill.  Check meds.  Constipation.   Primary Care Provider:  Julaine Fusi  DO  CC:  Depression.  History of Present Illness: [name] is having new problems today: hallucinations after eye surgery. Fluctuating CBGs. Now living with her son., very inactive. Poor food choices. previously had a grandson who was cooking healthy food for her- now A1C is much higher than usual. Also c/o insomnia. Difficulty going to sleep-days and nights reversed now.  Depression History:      The patient denies a depressed mood most of the day and a diminished interest in her usual daily activities.         Preventive Screening-Counseling & Management  Alcohol-Tobacco     Alcohol drinks/day: 0     Smoking Status: quit     Year Quit: YEARS AGO  Current Medications (verified): 1)  Glipizide 10 Mg Xr24h-Tab (Glipizide) .... Take 1 Tablet By Mouth Once A Day 2)  Lasix 40 Mg Tabs (Furosemide) .... Take 1and 1/2  Tablets By Mouth Once A Day 3)  Norvasc 10 Mg Tabs (Amlodipine Besylate) .... Take  1 Tablet By Mouth Once A Day 4)  Aspirin 81 Mg Tbec (Aspirin) .... Take 1 Tablet By Mouth Once A Day 5)  Carvedilol 25 Mg Tabs (Carvedilol) .... Take 1 Tablet By Mouth Two Times A Day 6)  Cozaar 25 Mg  Tabs (Losartan Potassium) .... Take 1 Tablet By Mouth Once A Day 7)  Zocor 20 Mg  Tabs (Simvastatin) .... Take 1 Tablet By Mouth Once A Day 8)  Nexium 40 Mg Cpdr (Esomeprazole Magnesium) .... Take 1 Tablet By Mouth Once A Day 9)  Brimonidine Tartrate 0.15 % Soln (Brimonidine Tartrate) .... As Directed 10)  Timolol Maleate 0.5 % Solg (Timolol Maleate) .... As Directed 11)  Metformin Hcl 500 Mg Tabs (Metformin Hcl) .... Take 1 Tablet By Mouth Two Times A Day  Allergies (verified): 1)  ! * Lisinopril  Review of Systems      See HPI  Physical Exam  General:  alert and overweight-appearing.   Lungs:  normal respiratory effort and normal breath sounds.   Heart:  normal rate, regular rhythm, no murmur, no gallop, and no rub.   Abdomen:  soft, non-tender, no distention, no masses, no guarding, no rigidity, and bowel sounds hypoactive.   Extremities:  no edema Neurologic:  alert & oriented X3 and cranial nerves II-XII intact.  Diabetes Management Exam:    Foot Exam (with socks and/or shoes not present):       Sensory-Monofilament:          Left foot: normal          Right foot: normal   Impression & Recommendations:  Problem # 1:  HYPERTENSION (ICD-401.9) Still not at goal.  Her updated medication list for this problem includes:    Lasix 40 Mg Tabs (Furosemide) .Marland Kitchen... Take 1and 1/2  tablets by mouth once a day    Norvasc 10 Mg Tabs (Amlodipine besylate) .Marland Kitchen... Take 1 tablet by mouth once a day    Carvedilol 25 Mg Tabs (Carvedilol) .Marland Kitchen... Take 1 tablet by mouth two times a day    Cozaar 25 Mg Tabs (Losartan potassium) .Marland Kitchen... Take 1 tablet by mouth once a day  Orders: T-CBC w/Diff ([id number]) T-TSH ([id number])  BP today: 148/78 Prior BP: 143/72 (03/19/2010)  Prior 10 Yr Risk  Heart Disease: N/A (03/04/2009)  Labs Reviewed: K+: 4.3 (02/03/2010) Creat: : 1.25 (02/03/2010)   Chol: 180 (02/03/2010)   HDL: 47 (02/03/2010)   LDL: 106 (02/03/2010)   TG: 134 (02/03/2010)  Problem # 2:  HYPERLIPIDEMIA (ICD-272.4) Will check labs today.  Her updated medication list for this problem includes:    Zocor 20 Mg Tabs (Simvastatin) .Marland Kitchen... Take 1 tablet by mouth once a day  Orders: T-Comprehensive Metabolic Panel [id number]) T-Lipid Profile ([id number])  Problem # 3:  DIABETES MELLITUS, TYPE II (ICD-250.00) Much worse control. Probably seconday to new living arrangements and poor food choices. She need sto meet with Lupita Leash for education-she and her son toghether. I am uncomfortable increase or even continuing her metformin until I check her renal function. She will likely need to go on insulin sooner rather than later. 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 Otherwise she needs a podiatry appt for her very long toenails.  Her updated medication list for this problem includes:    Glipizide 10 Mg Xr24h-tab (Glipizide) .Marland Kitchen... Take 1 tablet by mouth once a day    Aspirin 81 Mg Tbec (Aspirin) .Marland Kitchen... Take 1 tablet by mouth once a day    Cozaar 25 Mg Tabs (Losartan potassium) .Marland Kitchen... Take 1 tablet by mouth once a day    Metformin Hcl 500 Mg Tabs (Metformin hcl) .Marland Kitchen... Take 1 tablet by mouth two times a day  Orders: T- Capillary Blood Glucose ([id number]) T-Hgb A1C (in-house) ([id number])  Labs Reviewed: Creat: 1.25 (02/03/2010)     Last Eye Exam: Proliferative diabetic retinopathy.   Tractional retinal detachment.   Cataract.    (01/28/2009) Reviewed HgBA1c results: 9.9 (09/11/2010)  6.4 (02/03/2010)  Complete Medication List: 1)  Glipizide 10 Mg Xr24h-tab (Glipizide) .... Take 1 tablet by mouth once a day 2)  Lasix 40 Mg Tabs (Furosemide) .... Take 1and 1/2  tablets by mouth once a day 3)  Norvasc 10 Mg Tabs (Amlodipine besylate) .... Take 1  tablet by mouth once a day 4)  Aspirin 81 Mg Tbec (Aspirin) .... Take 1 tablet by mouth once a day 5)  Carvedilol 25 Mg Tabs (Carvedilol) .... Take 1 tablet by mouth two times a day 6)  Cozaar 25 Mg Tabs (Losartan potassium) .... Take 1 tablet by mouth once a day 7)  Zocor 20 Mg Tabs (Simvastatin) .... Take 1 tablet by mouth once a day 8)  Nexium 40 Mg Cpdr (Esomeprazole magnesium) .... Take 1 tablet by mouth once a day 9)  Brimonidine Tartrate 0.15 % Soln (Brimonidine  tartrate) .... As directed 10)  Timolol Maleate 0.5 % Solg (Timolol maleate) .... As directed 11)  Metformin Hcl 500 Mg Tabs (Metformin hcl) .... Take 1 tablet by mouth two times a day  Other Orders: Podiatry Referral (Podiatry)  Patient Instructions: 1)  Please schedule a follow-up appointment in 3 months. Prescriptions: METFORMIN HCL 500 MG TABS (METFORMIN HCL) Take 1 tablet by mouth two times a day  #60 x 3   Entered and Authorized by:   Julaine Fusi  DO   Signed by:   Julaine Fusi  DO on 09/11/2010   Method used:   Electronically to        [facility] [id number] (retail)       [address] [place], [place]  [address]       Ph: [phone]       Fax: [phone]   RxID:   [id number] NEXIUM 40 MG CPDR (ESOMEPRAZOLE MAGNESIUM) Take 1 tablet by mouth once a day  #30 x 1   Entered and Authorized by:   Julaine Fusi  DO   Signed by:   Julaine Fusi  DO on 09/11/2010   Method used:   Electronically to        [facility] [id number] (retail)       [address]       [place], [place]  [address]       Ph: [phone]       Fax: [phone]   RxID:   [id number] CARVEDILOL 25 MG TABS (CARVEDILOL) Take 1 tablet by mouth two times a day  #60 x 3   Entered and Authorized by:   Julaine Fusi  DO   Signed by:   Julaine Fusi  DO on 09/11/2010   Method used:   Electronically to        [facility] [id number] (retail)       [address] [place], [place]  [address]       Ph: [phone]       Fax: [phone]   RxID:   [id number] GLIPIZIDE 10 MG XR24H-TAB (GLIPIZIDE) Take 1 tablet by mouth once a day  #30 x 6   Entered and Authorized by:   Julaine Fusi  DO   Signed by:   Julaine Fusi  DO on 09/11/2010   Method used:   Electronically to        [facility] [id number] (retail)       [address]       [place], [place]  [address]       Ph: [phone]       Fax: [phone]   RxID:   [id number]    Vital Signs:  Patient profile:   75 year old female Height:      64 inches (162.56 cm) Weight:      262 pounds (119.09 kg) BMI:     45.13 Temp:     99 degrees F (37.22 degrees C) oral Pulse rate:   91 / minute BP sitting:   148 / 78  (right arm)  Vitals Entered By: Angelina Ok RN (September 11, 2010 10:10 AM)   Prevention & Chronic Care  Immunizations   Influenza vaccine: Not documented    Tetanus booster: Not documented    Pneumococcal vaccine: Not documented    H. zoster vaccine: Not documented  Colorectal Screening   Hemoccult: Not documented    Colonoscopy: Not documented   Colonoscopy action/deferral: GI referral  (07/02/2009)  Other Screening   Pap smear: Not documented    Mammogram: No specific mammographic evidence of malignancy.  Assessment: BIRADS 1.   (07/19/2009)   Mammogram action/deferral: Ordered  (07/02/2009)    DXA bone density scan: Not documented   Smoking status: quit  (09/11/2010)  Diabetes Mellitus   HgbA1C: 9.9  (09/11/2010)    Eye exam: Proliferative diabetic retinopathy.   Tractional retinal detachment.   Cataract.     (01/28/2009)   Eye exam due: 01/2010    Foot exam: yes  (09/11/2010)   High risk foot: Yes  (01/03/2008)   Foot care education: Done  (05/30/2007)   Foot exam due: 01/02/2009    Urine microalbumin/creatinine ratio: 22.5  (07/02/2009)   Urine microalbumin action/deferral: Ordered  Lipids   Total  Cholesterol: 180  (02/03/2010)   Lipid panel action/deferral: Lipid Panel ordered   LDL: 106  (02/03/2010)   LDL Direct: Not documented   HDL: 47  (02/03/2010)   Triglycerides: 134  (02/03/2010)    SGOT (AST): 10  (02/03/2010)   SGPT (ALT): 8  (02/03/2010) CMP ordered    Alkaline phosphatase: 81  (02/03/2010)   Total bilirubin: 0.5  (02/03/2010)  Hypertension   Last Blood Pressure: 148 / 78  (09/11/2010)   Serum creatinine: 1.25  (02/03/2010)   Serum potassium 4.3  (02/03/2010) CMP ordered   Self-Management Support :    Patient will work on the following items until the next clinic visit to reach self-care goals:     Medications and monitoring: take my medicines every day, check my blood sugar, bring all of my medications to every visit, weigh myself weekly, examine my feet every day  (09/11/2010)     Eating: drink diet soda or water instead of juice or soda, eat more vegetables, use fresh or frozen vegetables, eat foods that are low in salt, eat baked foods instead of fried foods, eat fruit for snacks and desserts, limit or avoid alcohol  (09/11/2010)     Activity: take a 30 minute walk every day  (09/11/2010)    Diabetes self-management support: Written self-care plan, Education handout, Pre-printed educational material, Resources for patients handout  (09/11/2010)   Diabetes care plan printed   Diabetes education handout printed    Hypertension self-management support: Written self-care plan, Education handout, Pre-printed educational material, Resources for patients handout  (09/11/2010)   Hypertension self-care plan printed.   Hypertension education handout printed    Lipid self-management support: Written self-care plan, Education handout, Pre-printed educational material, Resources for patients handout  (09/11/2010)   Lipid self-care plan printed.   Lipid education handout printed      Resource handout printed.     Last LDL:                                                  106 (02/03/2010 6:36:00 PM)        Diabetic Foot Exam Foot Inspection Is there a history of a foot ulcer?  No Is there a foot ulcer now?              No Can the patient see the bottom of their feet?          No Are the shoes appropriate in style and fit?          Yes Is there swelling or an abnormal foot shape?          Yes Are the toenails long?                Yes Are the toenails thick?                Yes Are the toenails ingrown?              No Is there heavy callous build-up?              Yes Is there a claw toe deformity?                          No Is there elevated skin temperature?            No Is there limited ankle dorsiflexion?            No Is there foot or ankle muscle weakness?            No Do you have pain in calf while walking?           No      Comments: Someone checks her feet daily.  Swelling top of feet. Peeling of bottom of  both feet.  Great Toes on both feet long.  Small amout of callus build up.   10-g (5.07) Semmes-Weinstein Monofilament Test Performed by: Angelina Ok RN          Right Foot          Left Foot Visual Inspection               Test Control      normal         normal Site 1         normal         normal Site 2         normal         normal Site 3         normal         normal Site 4         normal         normal Site 5         normal         normal Site 6         normal         normal Site 7         normal         normal Site 8         normal         normal Site 9         normal         normal Site 10         normal         normal  Impression      normal         normal   Laboratory Results   Blood Tests   Date/Time Received: September 11, 2010 10:34 AM  Date/Time Reported: Laurence Ferrari  [place]  September 11, 2010 10:34 AM   HGBA1C: 9.9%   (Normal Range: Non-Diabetic - 3-6%   Control Diabetic - 6-8%) CBG Fasting:: 190mg /dL      Process Orders Check Orders Results:     [facility]:  Check successful Tests Sent for requisitioning (September 28, 2010 7:48 PM):     09/11/2010: [facility] -- T-Comprehensive Metabolic Panel [[id number]] (signed)     09/11/2010: [facility] -- T-Lipid Profile [phone] (signed)     09/11/2010: [facility] -- T-CBC w/Diff [[id number]] (signed)     09/11/2010: [facility] -- T-TSH [id number] (signed)

## 2011-01-20 NOTE — Assessment & Plan Note (Signed)
Summary: CLASSES/SB.   Vital Signs:  Patient profile:   75 year old female Weight:      260.8 pounds BMI:     44.93 MEDICAL NUTRITION THERAPY  Assessment:Patient now eating 3 meals a day, healthy foods, testing daily with help of grandaughter. Both patient & grandaughter agree that patient may benefit from low vision rehab services referral to assist her with caring for herself more independently. Patient will be going back to her home to live where she does not do as well living with her son.  OZ:HYQMVHQIO 3 pounds- unsure if htis is fluid gain as patients hands appear swollen today Medications: taking metformin and glipizide Blood sugars: two blood sugars  ~ 70, most < 130 and all < 180 mg/dl 24 hour recall: oatmeal packs x 2 for breakfast, sandwich or sometimes [employer] for lunch, baked  chicken and vegetables for dinner, yogurt or fruit for snacks Exercise: very limited  Diagnosis:  NB 1.4 Food & Nutrition related knowledge deficit - knowledge is improved- patient verbalizing understanding of how to eat to control her blood sugars.  NB 1.1- self monitoroing deficit- improved as along as patient has assistance.   Intervention:  1- Offered patient to return for DSMT to try to learn to test her own blood sugqr using talking meter. 2- Coordination of care- social worker aprised of situation and is wokring with family to try to get patient in home assistance. Will also request low vision rehab referral 3- Suggest decreased glipizide and titration of metformins now that patient eating better and blood sugars improved.  Monitoring: Blood sugars. verbalized understaidng of how, when and why to self monitor, how to eat to improve blood sugar control Evaluation:A1C, blood sugars  Follow-up:every 6 months or more as needed if patient or family have questions  Allergies: 1)  ! * Lisinopril   Complete Medication List: 1)  Glipizide 10 Mg Xr24h-tab (Glipizide) .... Take 1 tablet by  mouth once a day 2)  Lasix 40 Mg Tabs (Furosemide) .... Take 2 tablets once daily 3)  Norvasc 10 Mg Tabs (Amlodipine besylate) .... Take 1 tablet by mouth once a day 4)  Aspirin 81 Mg Tbec (Aspirin) .... Take 1 tablet by mouth once a day 5)  Carvedilol 25 Mg Tabs (Carvedilol) .... Take 1 tablet by mouth two times a day 6)  Cozaar 25 Mg Tabs (Losartan potassium) .... Take 1 tablet by mouth once a day 7)  Zocor 20 Mg Tabs (Simvastatin) .... Take 1 tablet by mouth once a day 8)  Nexium 40 Mg Cpdr (Esomeprazole magnesium) .... Take 1 tablet by mouth once a day 9)  Brimonidine Tartrate 0.15 % Soln (Brimonidine tartrate) .... As directed 10)  Timolol Maleate 0.5 % Solg (Timolol maleate) .... As directed 11)  Metformin Hcl 500 Mg Tabs (Metformin hcl) .... Take 1 tablet by mouth two times a day  Other Orders: MNT/Reassessment and Intervention, Individual, 15 Minutes ([id number])   Orders Added: 1)  MNT/Reassessment and Intervention, Individual, 15 Minutes [phone]

## 2011-01-20 NOTE — Progress Notes (Signed)
Summary: Soc. Work  Programme researcher, broadcasting/film/video of Call: Spoke with caregiver ([place]) and discussed [facility] program with him.  [name] has already visited with [facility] and they said that mother would have to pay out of pocket for [facility] since her income is $939 plus a 579.39 retirement check.  The family is not willing to pay anything out of pocket for mother's care.   Mother does not have Medicaid due to income and is therefore not eligible for any in-home aide services.

## 2011-01-20 NOTE — Letter (Signed)
Summary: [facility]  [facility] SUPPLIES   Imported By: Shon Hough 07/10/2010 14:52:51  _____________________________________________________________________  External Attachment:    Type:   Image     Comment:   External Document

## 2011-02-26 ENCOUNTER — Encounter: Payer: Self-pay | Admitting: Internal Medicine

## 2011-02-26 ENCOUNTER — Ambulatory Visit (INDEPENDENT_AMBULATORY_CARE_PROVIDER_SITE_OTHER): Payer: PRIVATE HEALTH INSURANCE | Admitting: Internal Medicine

## 2011-02-26 DIAGNOSIS — E119 Type 2 diabetes mellitus without complications: Secondary | ICD-10-CM

## 2011-02-26 DIAGNOSIS — K219 Gastro-esophageal reflux disease without esophagitis: Secondary | ICD-10-CM

## 2011-02-26 DIAGNOSIS — I2510 Atherosclerotic heart disease of native coronary artery without angina pectoris: Secondary | ICD-10-CM

## 2011-02-26 DIAGNOSIS — E785 Hyperlipidemia, unspecified: Secondary | ICD-10-CM

## 2011-02-26 DIAGNOSIS — I10 Essential (primary) hypertension: Secondary | ICD-10-CM

## 2011-02-26 LAB — COMPREHENSIVE METABOLIC PANEL
ALT: <8 U/L (ref 0–35)
BUN: 15 mg/dL (ref 6–23)
CO2: 27 mEq/L (ref 19–32)
Calcium: 8.6 mg/dL (ref 8.4–10.5)
Chloride: 105 mEq/L (ref 96–112)
Creat: 1.18 mg/dL (ref 0.40–1.20)
Total Bilirubin: 0.5 mg/dL (ref 0.3–1.2)

## 2011-02-26 MED ORDER — GLIPIZIDE ER 10 MG PO TB24: 10.0000 mg | ORAL_TABLET | Freq: Every day | ORAL | Status: DC

## 2011-02-26 MED ORDER — BRIMONIDINE TARTRATE 0.15 % OP SOLN: 1.0000 [drp] | OPHTHALMIC | Status: DC

## 2011-02-26 MED ORDER — ESOMEPRAZOLE MAGNESIUM 40 MG PO CPDR: 40.0000 mg | DELAYED_RELEASE_CAPSULE | Freq: Every day | ORAL | Status: DC

## 2011-02-26 MED ORDER — CARVEDILOL 25 MG PO TABS: 25.0000 mg | ORAL_TABLET | Freq: Two times a day (BID) | ORAL | Status: DC

## 2011-02-26 MED ORDER — LOSARTAN POTASSIUM 25 MG PO TABS: 25.0000 mg | ORAL_TABLET | Freq: Every day | ORAL | Status: DC

## 2011-02-26 MED ORDER — TIMOLOL HEMIHYDRATE 0.5 % OP SOLN: 1.0000 [drp] | OPHTHALMIC | Status: DC

## 2011-02-26 MED ORDER — METFORMIN HCL 500 MG PO TABS: 500.0000 mg | ORAL_TABLET | Freq: Two times a day (BID) | ORAL | Status: DC

## 2011-02-26 MED ORDER — ASPIRIN 81 MG PO TBEC: 81.0000 mg | DELAYED_RELEASE_TABLET | Freq: Every day | ORAL | Status: AC

## 2011-02-26 MED ORDER — AMLODIPINE BESYLATE 10 MG PO TABS: 10.0000 mg | ORAL_TABLET | Freq: Every day | ORAL | Status: DC

## 2011-02-26 MED ORDER — SIMVASTATIN 20 MG PO TABS: 20.0000 mg | ORAL_TABLET | Freq: Every day | ORAL | Status: DC

## 2011-02-26 MED ORDER — FUROSEMIDE 40 MG PO TABS: 60.0000 mg | ORAL_TABLET | Freq: Every day | ORAL | Status: DC

## 2011-02-27 LAB — LDL CHOLESTEROL, DIRECT: Direct LDL: 186 mg/dL — ABNORMAL HIGH

## 2011-02-27 MED ORDER — ROSUVASTATIN CALCIUM 10 MG PO TABS: 10.0000 mg | ORAL_TABLET | Freq: Every day | ORAL | Status: DC

## 2011-02-27 NOTE — Assessment & Plan Note (Signed)
She has not been able  To achieve her lipid goals. I will recheck her LDL cholesterol today and consider changing her to Crestor if she does not have improvement on the simvastatin. Ideally I would like to see her lipids at least under 100 ideally less than 70.  She does have significant cardiovascular risk factors.  She is followed by Dr. [name] of our cardiology  every 6 months.

## 2011-02-27 NOTE — Progress Notes (Signed)
  Subjective:    Patient ID: [name], female    DOB: 01-31-1936, 75 y.o.   MRN: [id number]  HPI  Patient comes in today for routine followup,medication refills and for management of her type 2 diabetes.  No new complaints today. Her vision continues to be poor secondary to diabetic  Proliferative retinopathy.  Her granddaughter is with her today and has her primary caregiver. She reports medication compliance she has no questions about her medications and brings in her pill bottles today for verification. She has been doing better with her diet in terms of low fat foods baked food instead of Friday and is trying to watch her sugar intake.  Review of Systems  denies any chest pain, shortness of breath, lower extremity swelling, fevers, chills, nausea vomiting diarrhea. She expresses some financial concerns about paying for her medications.    Objective:   Physical Exam  [nursing notereviewed. Constitutional: She is oriented to person, place, and time. She appears well-developed and well-nourished. No distress.  HENT:  Head: Normocephalic and atraumatic.  Eyes: Right pupil is not reactive. Left pupil is not reactive.  Cardiovascular: Normal rate, regular rhythm and normal heart sounds.   Pulmonary/Chest: Effort normal and breath sounds normal.  Abdominal: Soft. Bowel sounds are normal. She exhibits no distension. There is no tenderness.  Musculoskeletal: She exhibits edema. She exhibits no tenderness.  Neurological: She is alert and oriented to person, place, and time.  Skin: Skin is warm and dry. No rash noted. No erythema.  Psychiatric: She has a normal mood and affect. Her behavior is normal.  Eye abnormalities.        Assessment & Plan:

## 2011-02-27 NOTE — Assessment & Plan Note (Signed)
Continue to follow with cardiology. She is currently enrolled in his CHF home telemedicine monitoring program. No changes today to her medication list I will fax a list of her to her heart failure program.  Her granddaughter will call with the fax number.  The granddaughter states that the heart failure nurse was asking for her most recent lab work on him vital signs as well as her medication list.

## 2011-02-27 NOTE — Assessment & Plan Note (Signed)
She is doing excellent with her diabetes control. Her A1c is 6.5 today. I think she has achieved her goals based on her diet and the fact that her granddaughter's cooking for her and understands how to be healthy. At her previous visit her A1c was 9.9. Her granddaughter is also helping her with administration of her diabetes medications. No changes to her current regimen.

## 2011-03-04 LAB — GLUCOSE, CAPILLARY: Glucose-Capillary: 141 mg/dL — ABNORMAL HIGH (ref 70–99)

## 2011-03-05 LAB — DIFFERENTIAL
Basophils Relative: 0 % (ref 0–1)
Basophils Relative: 1 % (ref 0–1)
Eosinophils Absolute: 0.2 10*3/uL (ref 0.0–0.7)
Eosinophils Absolute: 0.2 10*3/uL (ref 0.0–0.7)
Eosinophils Relative: 3 % (ref 0–5)
Monocytes Absolute: 0.6 10*3/uL (ref 0.1–1.0)
Monocytes Relative: 7 % (ref 3–12)
Monocytes Relative: 9 % (ref 3–12)
Neutrophils Relative %: 55 % (ref 43–77)

## 2011-03-05 LAB — POCT I-STAT, CHEM 8
Calcium, Ion: 1.12 mmol/L (ref 1.12–1.32)
Creatinine, Ser: 1.1 mg/dL (ref 0.4–1.2)
Glucose, Bld: 214 mg/dL — ABNORMAL HIGH (ref 70–99)
Glucose, Bld: 268 mg/dL — ABNORMAL HIGH (ref 70–99)
HCT: 40 % (ref 36.0–46.0)
Hemoglobin: 13.6 g/dL (ref 12.0–15.0)
Hemoglobin: 13.9 g/dL (ref 12.0–15.0)
Potassium: 4.7 mEq/L (ref 3.5–5.1)
TCO2: 27 mmol/L (ref 0–100)
TCO2: 27 mmol/L (ref 0–100)

## 2011-03-05 LAB — URINALYSIS, ROUTINE W REFLEX MICROSCOPIC
Bilirubin Urine: NEGATIVE
Ketones, ur: NEGATIVE mg/dL
Nitrite: NEGATIVE
Protein, ur: NEGATIVE mg/dL
Specific Gravity, Urine: 1.011 (ref 1.005–1.030)
Urobilinogen, UA: 1 mg/dL (ref 0.0–1.0)

## 2011-03-05 LAB — CBC
HCT: 39.3 % (ref 36.0–46.0)
Hemoglobin: 13.2 g/dL (ref 12.0–15.0)
Hemoglobin: 13.5 g/dL (ref 12.0–15.0)
MCH: 30.1 pg (ref 26.0–34.0)
MCH: 30.6 pg (ref 26.0–34.0)
MCHC: 34.1 g/dL (ref 30.0–36.0)
MCHC: 34.4 g/dL (ref 30.0–36.0)
Platelets: 208 10*3/uL (ref 150–400)

## 2011-03-05 LAB — GLUCOSE, CAPILLARY: Glucose-Capillary: 190 mg/dL — ABNORMAL HIGH (ref 70–99)

## 2011-03-11 LAB — DIFFERENTIAL
Basophils Absolute: 0 10*3/uL (ref 0.0–0.1)
Basophils Relative: 1 % (ref 0–1)
Basophils Relative: 2 % — ABNORMAL HIGH (ref 0–1)
Eosinophils Relative: 3 % (ref 0–5)
Lymphocytes Relative: 29 % (ref 12–46)
Lymphocytes Relative: 31 % (ref 12–46)
Lymphs Abs: 1.9 10*3/uL (ref 0.7–4.0)
Monocytes Absolute: 0.5 10*3/uL (ref 0.1–1.0)
Monocytes Absolute: 0.5 10*3/uL (ref 0.1–1.0)
Monocytes Relative: 8 % (ref 3–12)
Monocytes Relative: 9 % (ref 3–12)
Neutro Abs: 3.4 10*3/uL (ref 1.7–7.7)
Neutrophils Relative %: 56 % (ref 43–77)

## 2011-03-11 LAB — CK TOTAL AND CKMB (NOT AT ARMC)
CK, MB: 0.9 ng/mL (ref 0.3–4.0)
Relative Index: INVALID (ref 0.0–2.5)
Total CK: 27 U/L (ref 7–177)
Total CK: 33 U/L (ref 7–177)
Total CK: 52 U/L (ref 7–177)

## 2011-03-11 LAB — POCT I-STAT, CHEM 8
BUN: 17 mg/dL (ref 6–23)
Calcium, Ion: 0.99 mmol/L — ABNORMAL LOW (ref 1.12–1.32)
Chloride: 109 mEq/L (ref 96–112)
Glucose, Bld: 104 mg/dL — ABNORMAL HIGH (ref 70–99)
HCT: 39 % (ref 36.0–46.0)
Potassium: 3.9 mEq/L (ref 3.5–5.1)

## 2011-03-11 LAB — CBC
Hemoglobin: 13.2 g/dL (ref 12.0–15.0)
MCHC: 33.4 g/dL (ref 30.0–36.0)
MCHC: 33.8 g/dL (ref 30.0–36.0)
MCV: 94.2 fL (ref 78.0–100.0)
RBC: 4.2 MIL/uL (ref 3.87–5.11)
RDW: 12.7 % (ref 11.5–15.5)
WBC: 6.1 10*3/uL (ref 4.0–10.5)

## 2011-03-11 LAB — GLUCOSE, CAPILLARY
Glucose-Capillary: 142 mg/dL — ABNORMAL HIGH (ref 70–99)
Glucose-Capillary: 153 mg/dL — ABNORMAL HIGH (ref 70–99)
Glucose-Capillary: 91 mg/dL (ref 70–99)

## 2011-03-11 LAB — BASIC METABOLIC PANEL
CO2: 25 mEq/L (ref 19–32)
Calcium: 8.7 mg/dL (ref 8.4–10.5)
Chloride: 106 mEq/L (ref 96–112)
Creatinine, Ser: 1 mg/dL (ref 0.4–1.2)
Glucose, Bld: 130 mg/dL — ABNORMAL HIGH (ref 70–99)

## 2011-03-11 LAB — CARDIAC PANEL(CRET KIN+CKTOT+MB+TROPI)
Relative Index: INVALID (ref 0.0–2.5)
Total CK: 52 U/L (ref 7–177)
Troponin I: 0.03 ng/mL (ref 0.00–0.06)

## 2011-03-11 LAB — LIPID PANEL
Cholesterol: 158 mg/dL (ref 0–200)
HDL: 46 mg/dL (ref >39)
Total CHOL/HDL Ratio: 3.4 RATIO
VLDL: 16 mg/dL (ref 0–40)

## 2011-03-11 LAB — POCT CARDIAC MARKERS
CKMB, poc: <1 ng/mL — ABNORMAL LOW (ref 1.0–8.0)
CKMB, poc: <1 ng/mL — ABNORMAL LOW (ref 1.0–8.0)
Troponin i, poc: <0.05 ng/mL (ref 0.00–0.09)

## 2011-03-11 LAB — URINALYSIS, ROUTINE W REFLEX MICROSCOPIC
Bilirubin Urine: NEGATIVE
Glucose, UA: NEGATIVE mg/dL
Glucose, UA: NEGATIVE mg/dL
Hgb urine dipstick: NEGATIVE
Ketones, ur: NEGATIVE mg/dL
Ketones, ur: NEGATIVE mg/dL
Nitrite: NEGATIVE
Protein, ur: NEGATIVE mg/dL
Specific Gravity, Urine: 1.008 (ref 1.005–1.030)
Urobilinogen, UA: 1 mg/dL (ref 0.0–1.0)
pH: 7 (ref 5.0–8.0)

## 2011-03-11 LAB — HEPATIC FUNCTION PANEL
AST: 13 U/L (ref 0–37)
Albumin: 3.2 g/dL — ABNORMAL LOW (ref 3.5–5.2)
Total Bilirubin: 0.5 mg/dL (ref 0.3–1.2)
Total Protein: 6.4 g/dL (ref 6.0–8.3)

## 2011-03-11 LAB — COMPREHENSIVE METABOLIC PANEL
ALT: 13 U/L (ref 0–35)
AST: 16 U/L (ref 0–37)
Albumin: 3.4 g/dL — ABNORMAL LOW (ref 3.5–5.2)
Alkaline Phosphatase: 82 U/L (ref 39–117)
GFR calc Af Amer: 58 mL/min — ABNORMAL LOW (ref >60)
Glucose, Bld: 90 mg/dL (ref 70–99)
Potassium: 4.1 mEq/L (ref 3.5–5.1)
Sodium: 138 mEq/L (ref 135–145)
Total Protein: 7.1 g/dL (ref 6.0–8.3)

## 2011-03-11 LAB — BRAIN NATRIURETIC PEPTIDE: Pro B Natriuretic peptide (BNP): 148 pg/mL — ABNORMAL HIGH (ref 0.0–100.0)

## 2011-03-11 LAB — APTT: aPTT: 30 seconds (ref 24–37)

## 2011-03-11 LAB — PROTIME-INR
INR: 1.17 (ref 0.00–1.49)
Prothrombin Time: 14.8 seconds (ref 11.6–15.2)

## 2011-03-15 LAB — DIFFERENTIAL
Basophils Absolute: 0 10*3/uL (ref 0.0–0.1)
Eosinophils Absolute: 0.2 10*3/uL (ref 0.0–0.7)
Eosinophils Relative: 4 % (ref 0–5)
Monocytes Absolute: 0.4 10*3/uL (ref 0.1–1.0)

## 2011-03-15 LAB — POCT I-STAT, CHEM 8
BUN: 26 mg/dL — ABNORMAL HIGH (ref 6–23)
Calcium, Ion: 1.13 mmol/L (ref 1.12–1.32)
Creatinine, Ser: 1.2 mg/dL (ref 0.4–1.2)
Hemoglobin: 12.9 g/dL (ref 12.0–15.0)
Sodium: 141 mEq/L (ref 135–145)
TCO2: 28 mmol/L (ref 0–100)

## 2011-03-15 LAB — CBC
HCT: 37.6 % (ref 36.0–46.0)
Hemoglobin: 12.8 g/dL (ref 12.0–15.0)
MCV: 94.1 fL (ref 78.0–100.0)
Platelets: 160 10*3/uL (ref 150–400)
RDW: 12.7 % (ref 11.5–15.5)

## 2011-03-15 LAB — POCT CARDIAC MARKERS: Myoglobin, poc: 103 ng/mL (ref 12–200)

## 2011-03-20 ENCOUNTER — Other Ambulatory Visit: Payer: Self-pay | Admitting: Internal Medicine

## 2011-03-28 LAB — BASIC METABOLIC PANEL
CO2: 32 mEq/L (ref 19–32)
Calcium: 9.5 mg/dL (ref 8.4–10.5)
Chloride: 99 mEq/L (ref 96–112)
GFR calc Af Amer: 37 mL/min — ABNORMAL LOW (ref >60)
Glucose, Bld: 157 mg/dL — ABNORMAL HIGH (ref 70–99)
Sodium: 139 mEq/L (ref 135–145)

## 2011-03-28 LAB — GLUCOSE, CAPILLARY
Glucose-Capillary: 105 mg/dL — ABNORMAL HIGH (ref 70–99)
Glucose-Capillary: 145 mg/dL — ABNORMAL HIGH (ref 70–99)
Glucose-Capillary: 200 mg/dL — ABNORMAL HIGH (ref 70–99)

## 2011-03-28 LAB — CBC
Hemoglobin: 14.5 g/dL (ref 12.0–15.0)
MCHC: 33.7 g/dL (ref 30.0–36.0)
MCV: 94.5 fL (ref 78.0–100.0)
RBC: 4.54 MIL/uL (ref 3.87–5.11)
RDW: 13.4 % (ref 11.5–15.5)

## 2011-03-29 LAB — URINE MICROSCOPIC-ADD ON

## 2011-03-29 LAB — DIFFERENTIAL
Basophils Absolute: 0 10*3/uL (ref 0.0–0.1)
Lymphocytes Relative: 39 % (ref 12–46)
Lymphs Abs: 1.9 10*3/uL (ref 0.7–4.0)
Monocytes Absolute: 0.5 10*3/uL (ref 0.1–1.0)
Neutro Abs: 2.4 10*3/uL (ref 1.7–7.7)

## 2011-03-29 LAB — COMPREHENSIVE METABOLIC PANEL
Albumin: 3.6 g/dL (ref 3.5–5.2)
BUN: 16 mg/dL (ref 6–23)
Calcium: 9.3 mg/dL (ref 8.4–10.5)
Chloride: 106 mEq/L (ref 96–112)
Creatinine, Ser: 1.23 mg/dL — ABNORMAL HIGH (ref 0.4–1.2)
GFR calc Af Amer: 52 mL/min — ABNORMAL LOW (ref >60)
Total Bilirubin: 0.9 mg/dL (ref 0.3–1.2)
Total Protein: 7.4 g/dL (ref 6.0–8.3)

## 2011-03-29 LAB — URINALYSIS, ROUTINE W REFLEX MICROSCOPIC
Glucose, UA: NEGATIVE mg/dL
Nitrite: NEGATIVE
Specific Gravity, Urine: 1.024 (ref 1.005–1.030)
pH: 5.5 (ref 5.0–8.0)

## 2011-03-29 LAB — CBC
HCT: 39.8 % (ref 36.0–46.0)
MCHC: 34.1 g/dL (ref 30.0–36.0)
MCV: 94 fL (ref 78.0–100.0)
Platelets: 230 10*3/uL (ref 150–400)
RDW: 13.3 % (ref 11.5–15.5)

## 2011-03-30 LAB — GLUCOSE, CAPILLARY: Glucose-Capillary: 136 mg/dL — ABNORMAL HIGH (ref 70–99)

## 2011-03-31 LAB — BASIC METABOLIC PANEL
Calcium: 8.7 mg/dL (ref 8.4–10.5)
Chloride: 107 mEq/L (ref 96–112)
Creatinine, Ser: 0.97 mg/dL (ref 0.4–1.2)
GFR calc Af Amer: >60 mL/min (ref >60)
Sodium: 137 mEq/L (ref 135–145)

## 2011-03-31 LAB — GLUCOSE, CAPILLARY
Glucose-Capillary: 101 mg/dL — ABNORMAL HIGH (ref 70–99)
Glucose-Capillary: 102 mg/dL — ABNORMAL HIGH (ref 70–99)
Glucose-Capillary: 92 mg/dL (ref 70–99)

## 2011-03-31 LAB — CBC
MCV: 93 fL (ref 78.0–100.0)
RBC: 3.99 MIL/uL (ref 3.87–5.11)
WBC: 4.5 10*3/uL (ref 4.0–10.5)

## 2011-04-06 LAB — BASIC METABOLIC PANEL
BUN: 20 mg/dL (ref 6–23)
CO2: 30 mEq/L (ref 19–32)
Calcium: 8.8 mg/dL (ref 8.4–10.5)
Creatinine, Ser: 1.02 mg/dL (ref 0.4–1.2)
Glucose, Bld: 131 mg/dL — ABNORMAL HIGH (ref 70–99)

## 2011-04-06 LAB — CBC
MCHC: 33.4 g/dL (ref 30.0–36.0)
RDW: 13.7 % (ref 11.5–15.5)

## 2011-04-06 LAB — GLUCOSE, CAPILLARY: Glucose-Capillary: 104 mg/dL — ABNORMAL HIGH (ref 70–99)

## 2011-04-06 LAB — PROTIME-INR
INR: 1.1 (ref 0.00–1.49)
Prothrombin Time: 15 seconds (ref 11.6–15.2)

## 2011-04-07 LAB — BASIC METABOLIC PANEL
CO2: 28 mEq/L (ref 19–32)
GFR calc non Af Amer: 57 mL/min — ABNORMAL LOW (ref >60)
Glucose, Bld: 103 mg/dL — ABNORMAL HIGH (ref 70–99)
Potassium: 4.8 mEq/L (ref 3.5–5.1)
Sodium: 142 mEq/L (ref 135–145)

## 2011-04-07 LAB — GLUCOSE, CAPILLARY
Glucose-Capillary: 85 mg/dL (ref 70–99)
Glucose-Capillary: 93 mg/dL (ref 70–99)

## 2011-04-07 LAB — CBC
HCT: 40.2 % (ref 36.0–46.0)
Hemoglobin: 13.7 g/dL (ref 12.0–15.0)
RDW: 13.5 % (ref 11.5–15.5)

## 2011-04-07 LAB — APTT: aPTT: 33 seconds (ref 24–37)

## 2011-04-15 ENCOUNTER — Telehealth: Payer: Self-pay | Admitting: *Deleted

## 2011-04-15 NOTE — Telephone Encounter (Signed)
Call to pt's home number no answer.  Call to [facility].  Pt has not picked up the prescription for Crestor.  Pt however has 2 other meds ready for pick up.  Pharmacy to give pt the Crestor at pick up.  Call to pt's daughter informed her that pt should be on Crestor now for her Cholesterol.  Pharmacy will give pt the Crestor along with her other medications.  Pt is  To call if she has questions or problems with the Crestor.

## 2011-04-16 ENCOUNTER — Other Ambulatory Visit: Payer: Self-pay | Admitting: Internal Medicine

## 2011-04-18 ENCOUNTER — Inpatient Hospital Stay (HOSPITAL_COMMUNITY)
Admission: EM | Admit: 2011-04-18 | Discharge: 2011-04-19 | DRG: 313 | Disposition: A | Discharge status: Home / Self Care | Payer: Medicare Other | Attending: Internal Medicine | Admitting: Internal Medicine

## 2011-04-18 ENCOUNTER — Encounter: Payer: Self-pay | Admitting: Internal Medicine

## 2011-04-18 ENCOUNTER — Emergency Department (HOSPITAL_COMMUNITY): Payer: Medicare Other

## 2011-04-18 DIAGNOSIS — E119 Type 2 diabetes mellitus without complications: Secondary | ICD-10-CM | POA: Diagnosis present

## 2011-04-18 DIAGNOSIS — E860 Dehydration: Secondary | ICD-10-CM

## 2011-04-18 DIAGNOSIS — R0789 Other chest pain: Principal | ICD-10-CM | POA: Diagnosis present

## 2011-04-18 DIAGNOSIS — N179 Acute kidney failure, unspecified: Secondary | ICD-10-CM | POA: Diagnosis present

## 2011-04-18 DIAGNOSIS — I428 Other cardiomyopathies: Secondary | ICD-10-CM | POA: Diagnosis present

## 2011-04-18 DIAGNOSIS — R609 Edema, unspecified: Secondary | ICD-10-CM | POA: Diagnosis present

## 2011-04-18 DIAGNOSIS — H5440 Blindness, one eye, unspecified eye: Secondary | ICD-10-CM | POA: Diagnosis present

## 2011-04-18 DIAGNOSIS — E669 Obesity, unspecified: Secondary | ICD-10-CM | POA: Diagnosis present

## 2011-04-18 DIAGNOSIS — I10 Essential (primary) hypertension: Secondary | ICD-10-CM | POA: Diagnosis present

## 2011-04-18 LAB — POCT I-STAT, CHEM 8
BUN: 24 mg/dL — ABNORMAL HIGH (ref 6–23)
Calcium, Ion: 1.05 mmol/L — ABNORMAL LOW (ref 1.12–1.32)
Chloride: 106 mEq/L (ref 96–112)
Creatinine, Ser: 1.7 mg/dL — ABNORMAL HIGH (ref 0.4–1.2)
Glucose, Bld: 113 mg/dL — ABNORMAL HIGH (ref 70–99)
HCT: 41 % (ref 36.0–46.0)
Potassium: 4.6 mEq/L (ref 3.5–5.1)

## 2011-04-18 LAB — CK TOTAL AND CKMB (NOT AT ARMC)
CK, MB: 1.2 ng/mL (ref 0.3–4.0)
Relative Index: INVALID (ref 0.0–2.5)
Total CK: 39 U/L (ref 7–177)

## 2011-04-18 LAB — CBC
HCT: 39.9 % (ref 36.0–46.0)
Hemoglobin: 13.6 g/dL (ref 12.0–15.0)
MCV: 90.1 fL (ref 78.0–100.0)
RDW: 12.7 % (ref 11.5–15.5)
WBC: 6.7 10*3/uL (ref 4.0–10.5)

## 2011-04-18 LAB — URINALYSIS, ROUTINE W REFLEX MICROSCOPIC
Glucose, UA: NEGATIVE mg/dL
Hgb urine dipstick: NEGATIVE
Ketones, ur: NEGATIVE mg/dL
Protein, ur: NEGATIVE mg/dL
pH: 5.5 (ref 5.0–8.0)

## 2011-04-18 LAB — COMPREHENSIVE METABOLIC PANEL
AST: 15 U/L (ref 0–37)
Albumin: 3.6 g/dL (ref 3.5–5.2)
BUN: 20 mg/dL (ref 6–23)
Calcium: 8.6 mg/dL (ref 8.4–10.5)
Creatinine, Ser: 1.33 mg/dL — ABNORMAL HIGH (ref 0.4–1.2)
GFR calc Af Amer: 47 mL/min — ABNORMAL LOW (ref >60)
Total Bilirubin: 0.3 mg/dL (ref 0.3–1.2)
Total Protein: 7.5 g/dL (ref 6.0–8.3)

## 2011-04-18 LAB — GLUCOSE, CAPILLARY: Glucose-Capillary: 165 mg/dL — ABNORMAL HIGH (ref 70–99)

## 2011-04-18 LAB — POCT CARDIAC MARKERS
CKMB, poc: <1 ng/mL — ABNORMAL LOW (ref 1.0–8.0)
Troponin i, poc: <0.05 ng/mL (ref 0.00–0.09)

## 2011-04-18 NOTE — H&P (Signed)
Hospital Admission Note Date: 04/18/2011  Patient name: [name] Medical record number: [id number] Date of birth: Nov 19, 1936 Age: 75 y.o. Gender: female PCP: Anderson Malta, DO  Medical Service: INTERNAL MEDICINE TEACHING SERVICE B  Attending physician: Dr. Ulyess Mort     Resident [phone]): Dr. Denton Meek   Pager: [phone] Resident (R1): Dr. Odis Luster    Pager: [phone]  Chief Complaint:chest pain  History of Present Illness: 76yo W with PMH of nonischemic cardiomyopathy with EF of 30-35%, DM2 who was brought to the ED by her family after a brief episode of chest pain at home. She reports that earlier in the day she experienced a "fluttery" chest discomfort in the R side of her chest. She also felt numbness in her feet and stomach discomfort at the same time. These symptoms resolved on their own in less than 10 min. She states that she has been otherwise well except that she recently noticed lower extremity edema so restarted her HCTZ. The last few days, she reports frequent urination and nocturia but slightly decreased appetite. She denies fever/chills, change in bowel habits, shortness of breath, sick contacts, nausea/vomiting, lightheadedness, or headache. In the ED, it was noted that her creatinine was elevated to 1.7 from baseline of 0.8.   Current Outpatient Prescriptions  Medication Sig Dispense Refill  . amLODipine (NORVASC) 10 MG tablet Take 1 tablet (10 mg total) by mouth daily.  90 tablet  0  . aspirin 81 MG EC tablet Take 1 tablet (81 mg total) by mouth daily.  90 tablet  0  . brimonidine (ALPHAGAN) 0.15 % ophthalmic solution Place 1 drop into both eyes as directed.  15 mL  0  . carvedilol (COREG) 25 MG tablet Take 1 tablet (25 mg total) by mouth 2 (two) times daily.  180 tablet  0  . esomeprazole (NEXIUM) 40 MG capsule Take 1 capsule (40 mg total) by mouth daily.  90 capsule  0  . furosemide (LASIX) 40 MG tablet Take 1.5 tablets (60 mg total) by mouth daily.  135 tablet   0  . glipiZIDE (GLUCOTROL) 10 MG 24 hr tablet Take 1 tablet (10 mg total) by mouth daily.  90 tablet  0  . losartan (COZAAR) 25 MG tablet Take 1 tablet (25 mg total) by mouth daily.  30 tablet  11  . metFORMIN (GLUCOPHAGE) 500 MG tablet Take 1 tablet (500 mg total) by mouth 2 (two) times daily.  180 tablet  0  . rosuvastatin (CRESTOR) 10 MG tablet Take 1 tablet (10 mg total) by mouth at bedtime.  30 tablet  11  . timolol (BETIMOL) 0.5 % ophthalmic solution Place 1 drop into both eyes as directed.  15 mL  0    Allergies: Lisinopril  PMH: DM2 Diabetic retinopathy Nonischemic cardiomyopathy with EF 30-35% HTN  Social History: Great-granddaughter stays with her and cares for her. Retired. Finished high school. 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 No tobacco, alcohol, or illicit drugs.   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 No rashes or lesions Neurologic: Grossly normal  Lab results:  Color, Urine                             YELLOW            YELLOW  Appearance                               CLEAR             CLEAR  Specific Gravity                         1.011             1.005-1.030  pH                                       5.5               5.0-8.0  Urine Glucose                            NEGATIVE          NEG              mg/dL  Bilirubin                                 NEGATIVE          NEG  Ketones                                  NEGATIVE          NEG              mg/dL  Blood                                    NEGATIVE          NEG  Protein                                  NEGATIVE          NEG              mg/dL  Urobilinogen                             0.2               0.0-1.0          mg/dL  Nitrite                                  NEGATIVE          NEG  Leukocytes  NEGATIVE          NEG    MICROSCOPIC NOT DONE ON URINES WITH NEGATIVE PROTEIN, BLOOD, LEUKOCYTES,    NITRITE, OR GLUCOSE <1000 mg/dL.   WBC                                      6.7               4.0-10.5         K/uL  RBC                                      4.43              3.87-5.11        MIL/uL  Hemoglobin (HGB)                         13.6              12.0-15.0        g/dL  Hematocrit (HCT)                         39.9              36.0-46.0        %  MCV                                      90.1              78.0-100.0       fL  MCH -                                    30.7              26.0-34.0        pg  MCHC                                     34.1              30.0-36.0        g/dL  RDW                                      12.7              11.5-15.5        %  Platelet Count (PLT)                     197               150-400          K/uL  ISTAT  TCO2                                     26  0-100            mmol/L  Ionized Calcium                          1.05       l      1.12-1.32        mmol/L  Hemoglobin (HGB)                         13.9              12.0-15.0        g/dL  Hematocrit (HCT)                         41.0              36.0-46.0        %  Sodium (NA)                              139               135-145          mEq/L  Potassium (K)                            4.6               3.5-5.1          mEq/L  Chloride                                 106               96-112           mEq/L   Glucose                                  113        h      70-99            mg/dL  BUN                                      24         h      6-23             mg/dL  Creatinine                               1.7        h      0.4-1.2          Mg/dL   CKMB, POC                                <1.0       l      1.0-8.0          ng/mL  Troponin I, POC                          <  0.05             0.00-0.09        ng/mL  Myoglobin, POC                           123               12-200           ng/mL  Imaging results:  CXR   IMPRESSION:   No active disease  Other results: ECG: no signs of ischemia  Assessment & Plan by Problem: (1) Chest pain. Now resolved. Most likely not cardiac in origin. Cath in 2007 demonstrates no significant stenosis. Will cycle CE to rule out ACS.   (2) Acute kidney injury. Likely prerenal secondary to self-titration of HCTZ and poor po intake. Will hydrate with IVF with caution given low EF (30-35% in 2009). Recheck BMET in am.   (3) DM2. Hold metformin b/c of elevated creatinine. SSI.   (4) HTN. Continue home amlodipine. Hold losartan.   (5) DVT proph: heparin.

## 2011-04-19 LAB — BASIC METABOLIC PANEL
BUN: 19 mg/dL (ref 6–23)
Chloride: 109 mEq/L (ref 96–112)
GFR calc Af Amer: 56 mL/min — ABNORMAL LOW (ref >60)
GFR calc non Af Amer: 46 mL/min — ABNORMAL LOW (ref >60)
Potassium: 5.2 mEq/L — ABNORMAL HIGH (ref 3.5–5.1)

## 2011-04-19 LAB — CARDIAC PANEL(CRET KIN+CKTOT+MB+TROPI)
CK, MB: 1.1 ng/mL (ref 0.3–4.0)
Relative Index: INVALID (ref 0.0–2.5)
Troponin I: 0.01 ng/mL (ref 0.00–0.06)

## 2011-04-19 LAB — GLUCOSE, CAPILLARY: Glucose-Capillary: 113 mg/dL — ABNORMAL HIGH (ref 70–99)

## 2011-04-19 NOTE — Discharge Summary (Signed)
Brief summary of admission: 75yo W with PMH of non-ischemic cardiomyopathy with EF 30-35% and DM2 presented following short episode of chest pain at home.  1. Chest pain. The description of the pain as "fluttery" and accompanied by lower extremity numbness/tingling and stomach discomfort and lasting less than with no intervention seemed atypical for cardiac ischemia. ECG showed no evidence of ischemia. However, given risk factors, she was admitted for observation. Cardiac enzymes were cycled and found to be negative x 2. Chest pain did not recur. Patient discharged on home meds.  2. Acute renal insufficiency. On admission, patient reported taking her Lasix more often and urinating frequently for the past few days. Her creatinine was found to be elevated on ISTAT to 1.7, above baseline of approximately 1.1. This was thought to be prerenal and secondary to volume depletion from over-diuresis. Creatinine to returned to baseline with IVF hydration. Patient given instructions to weigh herself and administer Lasix with 3lb weight gain.

## 2011-04-30 ENCOUNTER — Ambulatory Visit: Payer: Self-pay | Admitting: Internal Medicine

## 2011-04-30 ENCOUNTER — Encounter: Payer: Self-pay | Admitting: Internal Medicine

## 2011-04-30 ENCOUNTER — Ambulatory Visit (INDEPENDENT_AMBULATORY_CARE_PROVIDER_SITE_OTHER): Payer: PRIVATE HEALTH INSURANCE | Admitting: Internal Medicine

## 2011-04-30 VITALS — BP 164/74 | HR 69 | Temp 98.5°F | Ht 64.0 in | Wt 264.5 lb

## 2011-04-30 DIAGNOSIS — E785 Hyperlipidemia, unspecified: Secondary | ICD-10-CM

## 2011-04-30 DIAGNOSIS — E119 Type 2 diabetes mellitus without complications: Secondary | ICD-10-CM

## 2011-04-30 DIAGNOSIS — I509 Heart failure, unspecified: Secondary | ICD-10-CM

## 2011-04-30 DIAGNOSIS — I10 Essential (primary) hypertension: Secondary | ICD-10-CM

## 2011-04-30 LAB — BASIC METABOLIC PANEL
BUN: 19 mg/dL (ref 6–23)
CO2: 24 mEq/L (ref 19–32)
Calcium: 8.9 mg/dL (ref 8.4–10.5)
Chloride: 102 mEq/L (ref 96–112)
Creat: 1.27 mg/dL — ABNORMAL HIGH (ref 0.40–1.20)

## 2011-04-30 LAB — GLUCOSE, CAPILLARY: Glucose-Capillary: 70 mg/dL (ref 70–99)

## 2011-04-30 MED ORDER — DORZOLAMIDE HCL-TIMOLOL MAL 2-0.5 % OP SOLN: 1.0000 [drp] | Freq: Two times a day (BID) | OPHTHALMIC | Status: DC

## 2011-04-30 MED ORDER — FUROSEMIDE 20 MG PO TABS: 20.0000 mg | ORAL_TABLET | Freq: Every day | ORAL | Status: DC

## 2011-04-30 NOTE — Assessment & Plan Note (Signed)
I discussed the management of heart failure in the home environment. We discussed how to use her Lasix safely and titrated based on her daily weights. I also discussed the concept of salt and fluid restriction. Today she does not seem to have any active issues with her heart failure. She does not have any abnormal breath sounds related to pulmonary edema in her lower extremity edema is at her baseline. I have discussed starting on a daily dose of 20 mg of Lasix and will titrate this based on swelling in her legs as well as a feeling of fullness in her abdomen and increased weight at home. Otherwise continue her current CHF management medications.

## 2011-04-30 NOTE — Assessment & Plan Note (Addendum)
Blood pressure is elevated today but she has not been taking her losartan and has only been using her Lasix as needed. She has been taking her Lasix only prn for leg swelling. I will start her on 20mg  daily which should help BP. I gave instructions for self titration for CHF. I also added losartan back onto her med list. I will see her back in 3 months for followup on her blood pressure.

## 2011-04-30 NOTE — Progress Notes (Signed)
  Subjective:    Patient ID: [name], female    DOB: 1936-06-08, 75 y.o.   MRN: [id number]  HPI [name] comes in today for routine followup and for hospital followup visit. She reports doing very well with no complaints. The reason for hospitalization appears to be volume depletion in the setting of increased Lasix dosing. She was mildly dehydrated with prerenal azotemia. She also complained of chest pain which is a chronic complaint of hers and is felt to be secondary to GI pain from reflux. She has peripheral neuropathy from her diabetes in her distal extremities both hands and feet. No other complaints today. Of note she has continued taking Lasix except she has been doing this when necessary based on like swelling and feeling of shortness of breath. She has not taken any Lasix today.  [name] has made a very good effort at controlling her diet in the setting of her diabetes and her A1c 2 weeks ago was 6.5. Given her significant dietary changes we have been able to manage her own pills and not use insulin. She takes metformin and glipizide regularly. Her granddaughter is with her at today's visit and helps her with her medications as well as her care at home.   Review of Systems Negative except for history of present illness.    Objective:   Physical Exam  Nursing note and vitals reviewed. Constitutional: She is oriented to person, place, and time. She appears well-developed and well-nourished. No distress.  HENT:  Head: Normocephalic and atraumatic.  Cardiovascular: Normal rate, regular rhythm and normal heart sounds.   Pulmonary/Chest: Effort normal and breath sounds normal.  Abdominal: Soft. Bowel sounds are normal. She exhibits no distension. There is no tenderness.  Musculoskeletal: She exhibits edema. She exhibits no tenderness.  Neurological: She is alert and oriented to person, place, and time.  Skin: Skin is warm and dry. No rash noted. No erythema.  Psychiatric:  She has a normal mood and affect. Her behavior is normal.   physical exam unchanged except for continued lower extremity 1+ pitting edema.        Assessment & Plan:

## 2011-04-30 NOTE — Assessment & Plan Note (Signed)
We'll continue her statin at the current dose I did review her last lipid panel and her LDL cholesterol continues to remain elevated at her next visit we can consider increasing her Crestor to 20 mg daily. She is concerned about the cost of this medication. Continuing to encourage daily use for cholesterol reduction and protection against worsening coronary artery disease.

## 2011-04-30 NOTE — Assessment & Plan Note (Signed)
Excellent DM control. Diet is much improved and it shows in her A1C control. No change today. Provided encouragement.

## 2011-05-01 LAB — URINALYSIS
Bilirubin Urine: NEGATIVE
Ketones, ur: NEGATIVE mg/dL
Nitrite: NEGATIVE
Protein, ur: NEGATIVE mg/dL
Urobilinogen, UA: 0.2 mg/dL (ref 0.0–1.0)

## 2011-05-01 NOTE — Discharge Summary (Signed)
[name], [name]             ACCOUNT NO.:  [id number]  MEDICAL RECORD NO.:  [id number]           PATIENT TYPE:  E  LOCATION:  [facility]                         FACILITY:  [facility]  PHYSICIAN:  Doneen Poisson, MD     DATE OF BIRTH:  1936-05-02  DATE OF ADMISSION:  04/18/2011 DATE OF DISCHARGE:  04/19/2011                              DISCHARGE SUMMARY   DISCHARGE DIAGNOSES: 1. Acute renal insufficiency, prerenal, resolving. 2. Noncardiac chest pain, resolved. 3. Nonischemic cardiomyopathy with an ejection fraction of 30-35% by     echocardiogram in 2009. 4. Diabetes mellitus type 2. 5. Hypertension. 6. History of diabetic retinopathy with vision loss in right eye. 7. Obesity. 8. Chronic lower extremity edema.  DISCHARGE MEDICATIONS: 1. Aspirin 81 mg enteric coated by mouth daily. 2. Amlodipine 10 mg by mouth daily. 3. Carvedilol 25 mg by mouth twice daily. 4. Furosemide 40 mg one and a half tablets by mouth daily.  The     patient instructed to weigh herself daily and titrate diuretic as     needed if weight increased by 3 pounds over discharge weight. 5. Glipizide XL 10 mg one tablet by mouth daily. 6. Losartan 25 mg by mouth daily. 7. Metformin 500 mg by mouth twice daily. 8. Nexium 40 mg by mouth daily. 9. Simvastatin 20 mg by mouth daily at bedtime.  DISPOSITION AND FOLLOWUP:  [name] was discharged from [facility] on April 19, 2011 in stable and improved condition.  Her chest pain had completely resolved and her creatinine had decreased to near her baseline of 1.1.  She was instructed to weigh herself daily at home and advised on how to adjust her diuretic medication as necessary.  She will follow up with Dr. Julaine Fusi in the [facility] on Apr 30, 2011 at 1:15 p.m.  At this time, Dr. Phillips Odor may consider rechecking a BMET for follow up of her creatinine.  PROCEDURES PERFORMED:  Portable chest x-ray on April 18, 2011,  which demonstrated no active disease.  CONSULTATIONS:  None.  ADMISSION HISTORY:  The patient is a 75 year old woman with a past medical history of nonischemic cardiomyopathy with an ejection fraction of 30- 35% and type 2 diabetes who was brought to the emergency department by her family after a brief episode of chest pain at home.  She reported that earlier in the day, she had experienced a "watery chest discomfort in the right side of her chest."  She also fell numbness in her feet and stomach discomfort at the same time, these symptoms resolved on their own in less than 10 minutes.  She stated that she had otherwise been well except that she had recently noticed lower extremity edema and had restarted her home diuretic.  Over the last few days, she reported frequent urination and nocturia, which she attributes to the diuretic use, but she also had slightly decreased appetite.  She denied fever, chills, change in bowel habits, shortness of breath, sick contacts, nausea, vomiting, lightheadedness, or headache.  In the emergency department, it was noted that her creatinine was elevated to 1.7. Review of prior  records demonstrates the baseline of approximately 1.1.  ADMITTING PHYSICAL EXAMINATION: VITAL SIGNS:  Temperature 98.4, blood pressure 161/71, heart rate 73,  respiratory rate 18, oxygen saturation 100% on room air. GENERAL APPEARANCE:  Alert and cooperative. HEAD:  Normocephalic without obvious abnormality, atraumatic. EYES:  Blind in right eye, otherwise unremarkable. NECK:  No adenopathy, no JVD, supple, symmetric.  Trachea midline. LUNGS:  Clear to auscultation bilaterally. HEART:  Regular rate and rhythm. ABDOMEN:  Soft, nontender.  Bowel sounds normal.  No masses.  No organomegaly. EXTREMITIES:  Trace edema.  Pulses 2+ and symmetric. SKIN:  Color, texture, turgor normal.  No rashes or lesions. NEUROLOGIC:  Grossly normal.  ADMITTING LABORATORY DATA:  Sodium 139,  potassium 4.6, chloride 106, glucose 113, BUN 24, creatinine 1.7.  White blood cell count 6.7, hemoglobin 13.6, hematocrit 39.9, platelet count 197.  Urinalysis negative for nitrites or leukocyte esterase.  HOSPITAL COURSE: 1. Chest pain most likely noncardiac in origin.  [name] has a     history of a cardiac catheterization in 2007, which demonstrated     nonobstructive disease.  No significant signs of active ischemia     were found on EKG.  She was admitted overnight for     observation and cardiac enzymes were negative x 2.  She was     continued on her home aspirin therapy and discharged with follow up     with her PCP.  2. Acute renal insufficiency.  At admission, she reported     increased use of her diuretic at home and then attempt to reduce     lower extremity edema.  She had also been eating and drinking     somewhat less, the elevated creatinine of 1.7 was thought to      represent prerenal azotemia secondary to volume depletion.  She      was hydrated cautiously with IV fluids given her history of      cardiomyopathy.  On the morning following admission, her      creatinine had returned to near her baseline of 1.15.  She was      advised to weigh herself at home and titrate the diuretic as      necessary to avoid future dehydration.  At follow up consider      a repeat BMET for follow up of her creatinine.  3. Nonischemic cardiomyopathy.  [name] was advised to weigh      herself daily and take Lasix if she notes a 3-pound weight gain.  4. Diabetes mellitus type 2.  Home metformin was initially held in the     setting of acute renal insufficiency.  However, home medications     were restarted at discharge.  5. Hypertension.  Home losartan was initially held in the setting of     acute renal insufficiency.  However, home medications were resumed     at the time of discharge.  DISCHARGE VITAL SIGNS:  Temperature 99.1, blood pressure 152/72, pulse 70,  respiratory rate 18, oxygen saturation 95% on room air.  DISCHARGE LABORATORY DATA:  Sodium 137, potassium 5.2, chloride 109, bicarb 21, BUN 19, creatinine 1.15, glucose 134, calcium 8.4.  Cardiac enzymes are negative x 2.   ______________________________ Whitney Post, MD   ______________________________ Doneen Poisson, MD   EB/MEDQ  D:  04/19/2011  T:  04/20/2011  Job:  [id number]  cc:   Edsel Petrin, D.O.  Electronically Signed by Whitney Post MD on 04/30/2011 09:26:02 PM Electronically  Signed by Doneen Poisson  on 05/01/2011 05:28:20 PM

## 2011-05-05 NOTE — Op Note (Signed)
[name], [name]             ACCOUNT NO.:  [id number]   MEDICAL RECORD NO.:  [id number]          PATIENT TYPE:  AMB   LOCATION:  [facility]S                          FACILITY:  [facility]   PHYSICIAN:  Alford Highland. Rankin, M.D.   DATE OF BIRTH:  Mar 29, 1936   DATE OF PROCEDURE:  04/29/2009  DATE OF DISCHARGE:  04/29/2009                               OPERATIVE REPORT   PREOPERATIVE DIAGNOSES:  1. Neovascular glaucoma, left eye.  2. Progressive proliferative diabetic retinopathy of the left eye.   POSTOPERATIVE DIAGNOSES:  1. Neovascular glaucoma, left eye.  2. Progressive proliferative diabetic retinopathy of the left eye.  3. Epiretinal membrane, left eye.   PROCEDURES:  1. Posterior vitrectomy with membrane peel - internal limiting      membrane peel - left eye.  2. Endolaser panretinal photocoagulation, left eye - 25-gauge.   SURGEON:  Alford Highland. Rankin, MD   ANESTHESIA:  General endotracheal anesthesia.   INDICATIONS FOR PROCEDURE:  The patient is a 75 year old woman who has  profound vision loss on the left eye on the basis of advanced diabetic  retinopathy in the setting of poor diabetes control, noncompliance with  medical management, now has neovascular glaucoma of the left eye.  Attempts in the office on multiple occasions to deliver panretinal  photocoagulation, but unsuccessful because of mental instability leading  to poor patient cooperation.  The patient understands this is an attempt  to deliver definite panretinal photocoagulation to the left eye via  vitrectomy is so as to maximize her retinal oxygenation and  overstabilize her retina.  She understands the risks of anesthesia,  including rare occurrence of death, loss of the eye including but not  limited to hemorrhage, infection, scarring, need for another surgery, no  change in vision, loss of vision, progressive disease despite  intervention.   PROCEDURE IN DETAIL:  After appropriate signed consent was obtained, the  patient was taken to the operating room.  In the operating room,  appropriate monitors followed by mild sedation.  General endotracheal  anesthesia was administered without difficult.  The left periocular  region was sterilely prepped and draped in usual ophthalmic fashion.  The lid speculum applied.  A 25-gauge trocar placed in the  inferotemporal quadrant.  The infusion was turned on.  Superior trocars  were applied.  Core vitrectomy was then begun.  Physician-induced  vitreous detachment was required and this was carried off the optic  nerve and mild vitreous hemorrhage was removed in this fashion.  Vitreous skirt elevated 360 degrees and vitreous trimmed 360 degrees.  Anterior hyaloid was confirmed to be removed.  At this time, Endolaser  photocoagulation was placed 360 degrees with well over 2500 applications  of light.  Epiretinal membrane with internal limiting membrane striae  was identified.  This was removed with a 25-gauge forceps as a  continuous sheath without difficulty or complication.  At this time, the  instruments were removed  from the eye.  Superior trocars were removed from the eye.  The infusion  had been removed.  Subconjunctival Decadron applied.  Sterile patch and  Fox shield were applied.  The patient tolerated the procedure well  without complication.      Alford Highland Rankin, M.D.  Electronically Signed     GAR/MEDQ  D:  04/29/2009  T:  04/30/2009  Job:  [id number]

## 2011-05-05 NOTE — Op Note (Signed)
NAMEMARLOWE, [name]             ACCOUNT NO.:  [id number]   MEDICAL RECORD NO.:  [id number]          PATIENT TYPE:  AMB   LOCATION:  [facility]S                          FACILITY:  [facility]   PHYSICIAN:  Alford Highland. Rankin, M.D.   DATE OF BIRTH:  23-Nov-1936   DATE OF PROCEDURE:  01/30/2009  DATE OF DISCHARGE:  01/30/2009                               OPERATIVE REPORT   PREOPERATIVE DIAGNOSES:  1. 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  2. Posterior capsule opacification, right eye.  3. Posterior synechiae, right eye with iris attached to the lens      capsule.   POSTOPERATIVE DIAGNOSES:  1. 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  2. Posterior capsule opacification, right eye.  3. Posterior synechiae, right eye with iris attached to the lens      capsule.   PROCEDURE:  1. Posterior vitreous membrane peel, endolaser photocoagulation,      injection of air, gas, fluid, exchange, and silicone oil 5000      centistokes to repair complex retinal detachment to the right eye.  2. Surgical posterior capsulotomy.  3. Posterior synechialysis of the right eye.   SURGEON:  Alford Highland. Rankin, MD   ANESTHESIA:  General endotracheal anesthesia.   INDICATION FOR PROCEDURE:  The patient is a 75 year old woman who has  neglected diabetic retinopathy and who is status post vitrectomy  membrane peel cataract traction for dense vitreous hemorrhage in the  last 6 weeks.  Gas injection was done to reattach retina.  The patient  has developed profound vision loss.  Found to have capsule opacification  on ultrasound earlier this week, was found to have retinal detachment.  The patient says this is an attempt to reattach retina.  She understands  the risks of anesthesia including rare occurrence of death, loss of  the  eye including, but not limited to hemorrhage, infection, scarring, need  for further surgery, no change in vision, loss of vision, progressive  disease despite intervention.  Appropriate signed consent was obtained.  The patient taken to the operating room.  In the operating room,  appropriate monitors followed by mild sedation.  General endotracheal  anesthesia was induced without difficulty.  The right periocular region  was identified, sterilely prepped and draped in usual sterile fashion.  Lid speculum applied.  Conjunctiva peritomy was then fashioned  superonasally and temporally.  Infusion cannula was secured 2.5 mm  posterior to the limbus.  Placement of vitreous cavity verified  visually.  Superior sclerotomies was then fashioned.  Microscope was  placed in position with the Biome attachment.  It was necessary to  create a surgical capsulotomy because the anterior capsule remnants were  completely opacified.  Also necessary to create paracentesis incision  with MVR blade which was then used to break synechialysis so the pupil  would enlarge.  When it was approximately 4.5 mm it was fixed in  position.  This allowed pupil enlargement without difficulty.  ProVisc  was then used to deepen the anterior chamber and enlarge pupil.  At this  time, attention could be drawn to the posterior pole.  Retina was  completely attached to the shallow detachment.  All the while the optic  nerve was perfused and macula appears to be fused, it did look like  there was diffuse just generalized ischemia in the retina.  Nonetheless,  membranes were found along the temporal to the temporal vascular  arcades.  These were engaged and mobilization of the retina was  confirmed.  Definitely the proliferative vitreoretinopathy was present.  The membranes found were not old fibrovascular sites.   No further membranes were identified.  Retinotomy was created  inferonasal to the optic nerve.  Fluid-fluid  exchange carried out.  Reaccumulation of subretinal preretinal fluid was performed.  At this  time endolaser photocoagulation placed 360 degrees.  Laser retinopexy  was applied around the retinotomy site.  A fluid-air exchange had been  completed.  The endolaser photocoagulation had been completed 360  degrees.  Retinotomy was closed.  An air-silicone oil exchange was then  carried out passively after closing one of the superior sclerotomies  with 7-0 Vicryl.  Remaining sclerotomies were then closed.  The infusion  removed similarly closed with 7-0 Vicryl.  Subconjunctival injection of  steroid applied after the remainder of the conjunctiva closed with 7-0  Vicryl.  Sterile patch and Fox shield applied.  The patient tolerated  the procedure without complication.  She was taken to the PACU in good  and stable addition.      Alford Highland Rankin, M.D.  Electronically Signed     GAR/MEDQ  D:  01/30/2009  T:  01/31/2009  Job:  [id number]

## 2011-05-05 NOTE — Assessment & Plan Note (Signed)
[facility]                            CARDIOLOGY OFFICE NOTE   [name], TIEDE                    MRN:          [id number]  DATE:11/01/2008                            DOB:          December 28, 1935    [name] is a 75 year old patient referred by Dr. Phillips Odor from [facility] for increasing shortness of breath, edema, and congestive heart  failure.   The patient has previously been seen in 2007 and she has had a  nonischemic cardiomyopathy as far back as 2005.  She has multiple  coronary risk factors including hypertension, hyperlipidemia, and  diabetes.  When we saw her back in 2007, she was noncompliant and  frequently have blood pressures in excess of 200 systolic.   Her last heart catheterization was done on June 01, 2006.  At that time,  she had no significant renal artery stenosis.  Her coronary arteries  were normal.  Her LVEDP was 18 and her EF was estimated at 35% with  global hypokinesis.  These findings were consistent with a nonischemic  cardiomyopathy.   Unfortunately, [name] continues to have some dietary indiscretion.  She  does not watch her salt.  She does not particularly watch her sweets  either.   She continues to be morbidly obese.   She has good family support, in fact, her mother and father are still  live close to their 16s.  They are living independently and [name] has  spent the last couple of months with them of [employer].  Her son was  with her today and talking to [name], I do not get the impression that  her breathing has any worse than usual.  She has chronic exertional  dyspnea.  She has not had any significant chest pain or palpitations.  There has been no presyncope.  She has a hard time getting around due to  arthritis in her hips and knees.   PAST MEDICAL HISTORY:  Otherwise remarkable for nonischemic  cardiomyopathy, hypertension, diabetes, hypercholesterolemia, and lower  extremity edema.   The patient was in the ER recently for what sounds like vertigo.   She has a history of gout.   There is history of mild chronic renal insufficiency.   She is allergic to LISINOPRIL.   FAMILY HISTORY:  Noncontributory.  Mother and father are still live.  The patient is widowed.  She has 5 children, who are a good support  system for her.  She does not smoke or drink.   She sees Dr. Phillips Odor regularly in the clinic.   Her diet is for poor.   CURRENT MEDICATIONS:  1. Glipizide 10 a day.  2. Lasix half of 60 mg a day.  3. Norvasc 10 mg a day.  4. Aspirin a day.  5. Coreg 12.5 b.i.d.  6. Cozaar 25 a day.  7. Zocor 20 a day.   PHYSICAL EXAMINATION:  GENERAL:  Remarkable for an overweight black  female in no distress.  VITAL SIGNS:  Her pulse is a bit high at 89, blood pressure 150/70,  weight 267, and afebrile.  HEENT:  Unremarkable.  NECK:  Carotids are normal without bruit.  No lymphadenopathy,  thyromegaly, or JVP elevation.  LUNGS:  Clear.  Good diaphragmatic motion.  No wheezing.  S1 and S2 with  distant heart sounds.  PMI not palpable.  ABDOMEN:  Protuberant.  Bowel sounds positive.  No AAA.  No tenderness.  No bruit.  No hepatosplenomegaly.  No hepatojugular reflux.  EXTREMITIES:  Distal pulses are intact.  No edema.  NEURO:  Nonfocal.  SKIN:  Warm and dry.  MUSCULOSKELETAL:  No muscular weakness.   She does have +1 edema bilaterally.  Her EKGs have showed sinus rhythm  with lateral T-wave changes, which are old with left axis deviation and  no acute changes.  Echocardiogram done recently shows an EF of 35% with  global hypokinesis unchanged from 2007.   IMPRESSION:  1. Probable continued nonischemic cardiomyopathy.  The patient will      take an extra half tablet of Lasix, if her swelling in her legs      gets bad at the end of the day.  We will increase her Coreg to 25      b.i.d.  She is otherwise stable.  A lot of her functional      limitation has to do with  her arthritis and central obesity.  2. Lower extremity edema.  Continue low-sodium diet, which she is very      poor at.  Continue diuretics.  No evidence of deep vein thrombosis      or need for venous duplex.  3. Diabetes.  Hemoglobin A1c quarterly.  Again, the patient needs to      cut down her sweets and complex carbohydrates.  4. Hypercholesterolemia.  The patient does not have coronary artery      disease by cath in 2007.  Continue low-dose Zocor for risk factor      modification.   Overall, I think [name] is stable.  She has had a known nonischemic  cardiomyopathy.  She has not required hospitalization for this.  She  needs to touch more diuretic and increased Coreg for heart rate control.  I will see her back in 6 months.     [name]. [name], MD, [facility]  Electronically Signed    PCN/MedQ  DD: 11/01/2008  DT: 11/01/2008  Job #: [id number]

## 2011-05-05 NOTE — Op Note (Signed)
[name], [name]             ACCOUNT NO.:  [id number]   MEDICAL RECORD NO.:  [id number]          PATIENT TYPE:  AMB   LOCATION:  SDS                          FACILITY:  [facility]   PHYSICIAN:  Alford Highland. Rankin, M.D.   DATE OF BIRTH:  [birth year]   DATE OF PROCEDURE:  DATE OF DISCHARGE:  12/24/2008                               OPERATIVE REPORT   PREOPERATIVE DIAGNOSES:  1. Retinal detachment to the right eye.  2. Dense vitreous hemorrhage to right eye.  3. Dense nuclear sclerotic and cortical cataract to the right eye.   POSTOPERATIVE DIAGNOSES:  1. Retinal detachment to the right eye.  2. Dense vitreous hemorrhage to right eye.  3. Dense nuclear sclerotic and cortical cataract to the right eye.  4. Epiretinal membranes, severe topographic distortion in the macular      region.  5. Proliferative diabetic retinopathy with areas of neovascularization      elsewhere peripherally and a retinal nonperfusion.   FINDINGS:  Severe peripheral retinal nonperfusion and traction retinal  detachment supratemporally.   PROCEDURES:  1. Posterior vitrectomy with membrane peel - 25-gauge macular pucker.  2. Panphotocoagulation for the diabetic retinopathy.  3. Extra capsular pars plana lensectomy with insertion of posterior      chamber intraocular lens primary - Alcon - [id number] power plus 25      diopter.  4. Injection of vitreous substitute - C3F8 8%.   SURGEON:  [name] Crape, MD   ANESTHESIA:  General endotracheal anesthesia.   INDICATIONS FOR PROCEDURE:  The patient is a 75 year old woman who has  profound vision loss level and light perception to the right eye for  many months if not longer on the basis of known dense vitreous  hemorrhage and apparent traction retinal detachment superiorly by  ultrasound with a dense nuclear sclerotic cataract.  The patient states  this is an attempt to remove the vitreous opacification and remove the  ocular medial pacities.  Also, we encountered  traction to place and  released photocoagulation dissolvent to the eye.  She understands that  the visual acuity and ultimately visibility will be limited by the  underlying macular status, macular perfusion, and possible optic nerve  root status as well.  The patient understands the risks of anesthesia  including rate occurrence of death, loss of the eye including, but not  limited to hemorrhage, infection, scarring, need for another surgery, no  change in vision, loss of vision, and progressive disease despite  intervention.   PROCEDURE IN DETAIL:  Appropriate signed consent was obtained, the  patient was taken to the operating room.  In the operating room,  appropriate monitors followed by mild sedation.  General endotracheal  anesthesia was instilled without difficulty.  The right periocular  region was sterilely prepped and draped in the usual sterile fashion  after the identification of the appropriate operative site.  The right  periocular region was sterilely prepped and draped.  Lid speculum  applied.  A 25-gauge trocar was then placed in the inferotemporal  quadrant.  The infusion was then turned on.  Superior trocars applied.  Microscope  with the BIOM attachments were then used to enter the  intraocular portion of the procedure.  Core vitrectomy was then begun.  Notable findings of dense cataracts of both central PSC cataract as well  as the posterior subcapsular cataract as well as cortical cataract and  nuclear sclerosis.  This necessitated prior to completion of the  vitrectomy portion necessitated the posterior capsule opening with a  vitrectomy instrumentation.  Conjunctival opening was then fashioned and  a 20-gauge MVR was then used to create a separate anterior site via the  pars plana to allow Fragmatome replaced and extracapsular Fragmatome  removal of the native lens.  No complications occurred.  Cortical clean  up was then carried out with vitrectomy  instrumentation as well.  Anterior capsules preserved.  At this time, the 20-gauge nuclear  sclerotomy was then closed with 7-0 Vicryl.  The plugs which were in  place in the 25-gauge sites were then removed.  The 25-gauge retractor  was then continued.  The vitreous was noted to be dense mostly old  vitreous hemorrhage.  There was some pigmentary dispersion, which was  then circumcised the vitreous base.  Excellent view of the peripheral  retina confirmed that there were white lines of the remaining ghost-type  vessels of nonperfusion peripherally.  There are multiple retractable  sites supratemporal, 1 in supranasal, 1 inferotemporal, and 1  inferonasal.  Dense epiretinal membrane was identified over the macular  region.  The 25-gauge forceps were then used to engage this and this was  removed in fragments and had dense old fibers attached, which was the  previously regressed neovascularization.  Besides, we were removed off  the entire posterior pole anterior and peripheral to the arcades  temporally.  No complications occurred.  At this time, endolaser  photocoagulation was begun.  The retraction detachments was noted to  have some subretinal fluid supportively.  Thus, fluid air exchange was  completed.  A central posterior capsule opening was made in the anterior  capsule and preserved so as to allow visualization.  Prior to the fluid  air exchange, it was necessary to place the intraocular lens.  A groove  level incision was fashioned superiorly and under the fluid in the eye  at low pressures, the limbus was then opened.  The anterior chamber and  the posterior chamber then deepened with Viscoat.  The intraocular lens  was then placed into the sulcus and rotated to the horizontal position  with excellent centration.   At this time, the limbal wound superiorly was closed with 10-0 nylon  sutures in the interrupted fashion.  No complications occurred.  At this  time, vitrectomy  instrumentation showed the place to replace into the  eye.  Fluid air exchange completed.  Under air, residual fluid  posteriorly and peripherally was drained.  Endolaser photocoagulation  was placed in the bed of its attachment supratemporally.  A further  laser photocoagulation was placed peripherally to clear the diabetic  retinal changes.  No complications occurred.  At this time, the  instruments were removed from the eye.  An air - C3F8 8% exchange  completed.  The conjunctiva was closed with 7-0 Vicryl.  Subconjunctival  Decadron applied.  Intraocular pressure was assessed and found to be  adequate.  Sterile patch and fox-shield applied.  The patient awakened  from anesthesia without difficulty and taken to the PACU in good and  stable condition without complications.      Alford Highland Rankin, M.D.  Electronically  Signed     GAR/MEDQ  D:  12/24/2008  T:  12/25/2008  Job:  [id number]

## 2011-05-05 NOTE — Op Note (Signed)
[name], [name]             ACCOUNT NO.:  [id number]   MEDICAL RECORD NO.:  [id number]          PATIENT TYPE:  OIB   LOCATION:  5123                         FACILITY:  [facility]   PHYSICIAN:  Alford Highland. Rankin, M.D.   DATE OF BIRTH:  17-Apr-1936   DATE OF PROCEDURE:  08/14/2009  DATE OF DISCHARGE:                               OPERATIVE REPORT   PREOPERATIVE DIAGNOSES:  1. Neovascular glaucoma, left eye.  2. Hyphema.  3. Progressive proliferative diabetic retinopathy of the left eye      despite previous panphotocoagulation.   POSTOPERATIVE DIAGNOSES:  1. Neovascular glaucoma, left eye.  2. Hyphema.  3. Progressive proliferative diabetic retinopathy of the left eye      despite previous panphotocoagulation.   PROCEDURE:  1. Posterior vitrectomy with endolaser panphotocoagulation - 25 gauge,      left eye.  2. Insertion of glaucoma aqueous shunt - [place]on-Baerveldt BG 102-350 -      anterior chamber in the superotemporal quadrant.  3. Injection of vitreous substitute - silicone oil 5000 centistokes.  4. Laser cyclophotocoagulation, left eye.  5. Laser iridoplasty, left eye.   SURGEON:  Alford Highland. Rankin, MD.   ANESTHESIA:  General endotracheal anesthesia.   INDICATIONS FOR PROCEDURE:  The patient is a 75 year old woman,  essentially monocular with the left eye, who returned 2 days previously  with intraocular pressure in the 60s with evidence of neovascular  glaucoma.  Posttherapeutic aqueous tap, significant hyphema developed  from the blood vessels going the anterior chamber.  The emergency  delivery of Avastin was given to the patient to try to help neovascular  blood vessel growth, although the scar tissue was present.  It is still  causing and triggering the pressure elevation.  The patient understands  the attempt to remove the medial opacification to instill silicone oil,  to calm the neovascular stimulus, to create further laser ablation but  also to create a bypass  shunt for the fluid to lower the pressure.  The  patient understands the risks of anesthesia and rare occurrence of  death.  She understands the risk to the eye including but not limited to  hemorrhage, infection, scarring, need for further surgery, no change in  vision, loss of vision, progression of disease despite intervention.   After informed signed consent was obtained, the patient was taken to the  operating room.  In the operating room, appropriate monitors were  followed by mild sedation.  General endotracheal anesthesia was induced  without difficulty.  The left periocular region was sterilely prepped  and draped in usual ophthalmic fashion.  Lid speculum applied.  A 25-  gauge trocar was initially placed in the anterior chamber and the  infusion placed in the anterior chamber to help clear the anterior  segment from mild hyphema, so the posterior segment could be viewed.   At this time, superior trocars applied.  Core vitrectomy was begun.  A  paracentesis incision was made in superiorly so as to drain the blood  from the anterior chamber.  Thereafter, the posterior vitrectomy could  proceed.  There was some moderate vitreous  hemorrhage dispersed through  the vitreous cavity.  After this was cleared, laser photocoagulation, a  very heavy and intense treatment was applied, 360 degrees with 1 or 2  rows placed inside the temporal arcades.  Notable findings, the nerve  was still perfused.   At this time, a separate infusion was then placed into the pars plana  and the infusion site was removed from the anterior chamber to the pars  plana.  Thereafter, fluid exchange was then completed.  Under air, it  was clear that a complete laser photocoagulation had been completed.  Decision was made to keep the area into place later on to exchanges for  silicone oil.  Silicone oil 5000 centistokes was then delivered  passively to the posterior iris plane.  Prior to completion of the   silicone oil fill, the Baerveldt BG 102-350 valve was placed in the  superotemporal quadrant after the superior and the lateral rectus muscle  had been isolated on 2-0 silk ties.  Foot plate was secured with an 8-0  nylon sutures.  The tube was brought forward, cut to the appropriate  length.  A tunnel incision with a crescent blade was fashioned in the  sclera up to the limbus.  Then, the anterior chamber was entered with a  sharp Superblade and through the tunnel.  The tube was cut to  appropriate length and fed into the anterior chamber.  Excellent tube  placement was noted.  7-0 Vicryl horizontal mattress was then placed,  scleral bites on either side of the tubes so as to prevent retraction.  Thereafter, a partial ligature closure of the 7-0 Vicryl was applied to  restrict but not to cut all of the fluid egress through the tube.  Tutoplast was then cut to size and 4 vertical mattress sutures and 7-0  Vicryl suture were then placed to cover the tube near the limbus.  Thereafter, the air - silicone oil exchange completed.  The  superotemporal sclerotomy closed with 7-0 Vicryl.  The 19-gauge incision  site, superonasal was then closed with 7-0 Vicryl.  The infusion was  then removed and similarly closed with 7-0 Vicryl.  Conjunctiva was  brought forward and closed with interrupted 7-0 Vicryl sutures with  vertical mattress sutures at the limbus to secure the conjunctiva over  the tube.  Subconjunctival Decadron applied inferiorly.  Sterile patch  and Fox shield applied to the left eye.  The intraocular pressure was  assessed and found to be adequate.  Anterior chamber was formed.  It  must be noted that during the intraocular portion of the procedure, the  laser photocoagulation was also applied with scleral depression to the  ciliary body inferior one-third to limit aqueous production.  Additionally, laser photocoagulation was applied to the anterior surface  of the iris transcorneal to  stem the flow of active sites of  hemorrhaging from neovascularization of the iris.   The patient tolerated the procedure without complication.  The patient  was taken to the PACU in stable addition.      Alford Highland Rankin, M.D.  Electronically Signed     GAR/MEDQ  D:  08/14/2009  T:  08/15/2009  Job:  [id number]

## 2011-05-08 NOTE — Discharge Summary (Signed)
[name], [name]             ACCOUNT NO.:  [id number]   MEDICAL RECORD NO.:  [id number]          PATIENT TYPE:  INP   LOCATION:  3706                         FACILITY:  [facility]   PHYSICIAN:  Dennis Bast, MD        DATE OF BIRTH:  1936/06/01   DATE OF ADMISSION:  05/28/2006  DATE OF DISCHARGE:  06/03/2006                                 DISCHARGE SUMMARY   DISCHARGE DIAGNOSES:  1.  Congestive heart failure secondary to nonischemic cardiomyopathy,      secondary to severe hypertension of noncompliance.  EF 45% to 50%.  2.  Severe hypertension.  3.  Chronic renal insufficiency.  4.  Known insulin-dependent diabetes mellitus type 2.  5.  Gout.   DISCHARGE MEDICATIONS:  1.  Norvasc 10 mg p.o. daily.  2.  Aspirin 325 mg p.o. daily.  3.  Bisoprolol 5 mg p.o. daily.  4.  Lasix 40 mg p.o. daily.  5.  Glipizide 10 mg p.o. daily.  6.  Lisinopril 10 mg p.o. daily.   DISPOSITION:  The patient is to go home with hospital followup on June 20th  [facility] by Dr. Jeanella Craze, and also cardiology appointment with  Dr. [name] on June 28th at 10 a.m.  She is to come on Monday, June 18th, for  BMP to followup on her creatinine and potassium.   PROCEDURES:  1.  Chest x-ray on June 8th, that showed cardiomegaly and mild pulmonary      edema.  2.  A 2-D echo on June 11th.  3.  Cardiac catheterization on June 12th.   IMPRESSION:  The patient who appeared to have nonischemic cardiomyopathy due  to medical noncompliance of hypertension.   CONSULTS:  Dr. Charlton Haws from [facility].   HISTORY OF PRESENT ILLNESS:  A 75 year old woman with past medical history  significant for hypertension, known insulin-dependent diabetes mellitus type  2 diagnosed 3 years ago, congestive heart failure with EF of 40% by Myoview,  medical noncompliant, who has been more short of breath than normal for the  last 2 days.  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  She developed third and fourth digit PIP joint  swelling and pain 1 week ago.  She was diagnosed with gout at [facility] and  started on Indomethacin.  Today, came for followup and was found severely  hypertensive and gave history of recent increasing shortness of breath.  Denies chest pain, nausea, vomiting, diarrhea, fever or chills.   LABS ON ADMISSION:  White blood cell count 6.6, hemoglobin 12.3, hematocrit  37.1, MCV 92.9, platelet count 213.  Sodium 137, potassium 4.1, chloride  104, CO2 of 29, glucose 168, BUN 13, creatinine 1.4, bilirubin total 0.9,  alkaline phosphatase at 126, AST 18, ALT 16, total protein 7.3, albumin 3.4,  calcium 8.9.  BNP 1155.0.  Cardiac enzymes x3 negative.  D-dimer 0.69.  Chest x-ray already dictated.   HOSPITAL COURSE:  1.  Congestive heart failure secondary to nonischemic cardiomyopathy,      secondary to severe hypertension of medical noncompliance.  The patient      was started initially with IV Lasix, and also she was started on her      regular blood pressure medication.  Initially, her metoprolol was      increased and then changed to bisoprolol.  The patient ruled out for      myocardial infarction, but due to the fact that she had had Myoview with      probable ischemia in February 2007, cardiology was consulted, who did      decided to do a cardiac catheterization.  Cardiac catheterization showed      no coronary artery disease, so after she was completely asymptomatic      from congestive heart failure, she was sent home on her regular blood      pressure medication except for metoprolol XL, which was bisoprolol 5 mg      p.o. daily.  2.  Severe hypertension.  The patient was admitted with a blood pressure of      230/103, and this is mainly secondary to her noncompliance with      medications.  At the time of her discharge on her regular regimen, blood       pressure is 133/68.  No further issues from this point of view except      that lisinopril was decreased to 10 mg p.o. daily and needs to be      titrated up as needed in the outpatient clinic.  3.  Chronic renal insufficiency.  Her creatinine on admission was 1.4, and      it bumped up a little bit during this hospitalization to 1.6 after the      catheterization and some diuresis.  Her lisinopril was held for 1 day,      and also diuretics were held for 1 day secondary to volume depletion.      At the time of her discharge, her creatinine is 1.3, and she is going to      have BMP to follow on this on next Monday.  She also had an elevated      potassium secondary to the use of lisinopril and concomitant use of      potassium, which was discontinued, and this also is going to be followed      on Monday by BMP.  4.  Non-insulin-dependent diabetes mellitus.  The patient did not present      any problems.  She was continued on her regular medication plus sliding      scale insulin.  5.  Gout.  No further problem from this.  The patient was started on      colchicine.   LABS ON DISCHARGE:  Sodium 136, potassium 4.9, chloride 108, CO2 of 25,  glucose 81, BUN 23, creatinine 1.3, calcium 8.6.  CBC: White blood cell  count 5.1, hemoglobin 12.8, hematocrit 37.5, MCV 93.1, platelet count 267.      Dennis Bast, MD     YC/MEDQ  D:  06/03/2006  T:  06/03/2006  Job:  [id number]   cc:   Donald Pore, MD  Fax: [phone]   Charlton Haws, M.D.  [address]  [address]  [place]  [place] [address]

## 2011-05-08 NOTE — Cardiovascular Report (Signed)
NAMEKIMERLY, ROWAND             ACCOUNT NO.:  [id number]   MEDICAL RECORD NO.:  [id number]           PATIENT TYPE:   LOCATION:                                 FACILITY:   PHYSICIAN:  Charlton Haws, M.D.          DATE OF BIRTH:   DATE OF PROCEDURE:  06/01/2006  DATE OF DISCHARGE:                              CARDIAC CATHETERIZATION   Coronary arteriography   INDICATIONS:  Congestive heart failure, cardiomyopathy, EF 35%.   Cine catheterization was done from the right femoral artery.   At the end of the case nonselective right iliac angiogram showed [place] to be in  good location for AngioSeal.  The device was deployed with good hemostasis.   The patient had significant hypertension with systolics above 190.  She was  given 10 of hydralazine and because of this and her congestive heart failure  selective renal arteriography was performed.   The patient had no significant renal artery stenosis.   CORONARY ARTERIOGRAPHY:  Left main coronary was normal.   Left anterior descending artery had a 40% tubular lesion in the proximal  vessel.  Mid and distal LAD were normal.  First diagonal branch had 40%  tubular disease proximally.   Circumflex coronary was nondominant and normal.   The right coronary artery was very large and dominant.  There was a large  PDA and two large posterior lateral branches.  They were normal.   Aortic pressure was 168/98.  LV pressure was 168/18.   IMPRESSION:  The patient would appear to have non-ischemic cardiomyopathy  due to medical noncompliance and hypertension.  She tolerated procedure  well.  So long as her hemoglobin and creatinine are fine in the morning I  think she can probably be discharged with continued medical therapy.           ______________________________  Charlton Haws, M.D.     PN/MEDQ  D:  06/01/2006  T:  06/01/2006  Job:  [id number]

## 2011-05-08 NOTE — Consult Note (Signed)
[name], [name]             ACCOUNT NO.:  [id number]   MEDICAL RECORD NO.:  [id number]          PATIENT TYPE:  INP   LOCATION:  3706                         FACILITY:  [facility]   PHYSICIAN:  Arvilla Meres, M.D. LHCDATE OF BIRTH:  27-Feb-1936   DATE OF CONSULTATION:  05/29/2006  DATE OF DISCHARGE:                                   CONSULTATION   PRIMARY CARE PHYSICIAN:  Dr. Shannan Harper.   CARDIOLOGIST:  Dr. Charlton Haws.   REFERRING PHYSICIAN:  Internal Medicine Teaching Service [facility].   REASON FOR CONSULTATION:  Congestive heart failure.   HISTORY OF PRESENT ILLNESS:  [name] is a delightful 75 year old woman  with multiple medical problems including severe hypertension, diabetes,  obesity and congestive heart failure secondary to presumed nonischemic  cardiomyopathy with an EF of 40% who was followed in our office by Dr. Charlton Haws.  She was admitted yesterday with congestive heart failure secondary  to medication noncompliance and recent nonsteroidal therapy for gout.  Her  blood pressure on admission was 230/103.  She tells me that up to a few days  ago she was in her usual state of health when she developed gout in her  right hand.  She initially started some of family members Naprosyn but when  this continued she went to the outpatient clinic and received some  indomethacin.  Over the past few days, she has developed increasing dyspnea  on exertion as well as some orthopnea and increase in her lower extremity  edema.  She also noticed a very mild chest fluttering but no significant  chest pain.  She presented to the emergency room.  On arrival, her blood  pressure was 230/103 and she was noted to have congestive heart failure.  She was given one dose of IV Lasix, started on oral Lasix and feels much  better after a mild diuresis.   She denies any history of cardiac catheterization.  In looking through her  records, she had Adenosine-Myoview in February of this year  which showed an  EF of 43% with significant LVH and a dilated cavity.  There is some question  of ischemia in the inferior wall at the mid and basilar level.  However, she  saw Dr. [name] in follow-up who felt this was primarily nonischemic  cardiomyopathy due to her severe hypertension and she was continued with  medical therapy.  Unfortunately, she has been relatively noncompliant with  her medicines missing doses of both her Lasix and her antihypertensive  regimen.   On review of systems, she notes that she very heavily snores and has  significant daytime fatigue.  She also has arthritic pain from her gout and  osteoarthritis.  Also notes urinary frequency.  The remainder of the review  of systems is negative except for HPI and problem list.   PAST MEDICAL HISTORY:  1.  CHF secondary to presumed nonischemic cardiomyopathy, likely      hypertensive.      1.  EF 30-40% by previous echocardiogram with global hypokinesis.      2.  Adenosine-Myoview in February of 2007 with EF of  43% with a mildly          dilated cavity.  Question of ischemia in the inferior wall.  2.  Severe hypertension.  3.  Obesity.  4.  Diabetes x4 years.  5.  Hyperlipidemia.  6.  Gout.  7.  Medical noncompliance.   MEDICATIONS:  1.  Glipizide 10 mg daily.  2.  Lisinopril 40 daily.  3.  Lasix 40 mg once a day.  4.  Lopressor 50 b.i.d.  5.  Norvasc 10 a day.  6.  Aspirin 81 a day.  7.  Lovenox.  8.  Colchicine.   DRUG ALLERGIES:  No known drug allergies.   SOCIAL HISTORY:  She is a retired Agricultural engineer.  She has a history of  tobacco but quit a long time ago.  She does not drink alcohol.  She is  widowed.  She lives alone in [place].   FAMILY HISTORY:  Mother is 94 years old and alive, has Alzheimer's disease.  Father is 76 and alive with prostate cancer.  There is no history of  premature coronary artery disease.   PHYSICAL EXAMINATION:  GENERAL:  She is well-appearing in no acute  distress,  sitting up in the chair.  VITAL SIGNS:  Blood pressure is now 136/73, heart rate is 71, she is  saturating 90% on room air.  I&O's:  She is currently negative 100 mL since  being on the floor.  HEENT:  Sclerae are anicteric.  EOMI.  There is no xanthelasmas.  Mucus  membranes are moist.  NECK:  Thick.  I am unable to assess her JVP.  Carotids are 2+ bilaterally  without any bruits.  There is no obvious lymphadenopathy or thyromegaly.  CARDIAC:  She has distant heart sounds with a regular rate and rhythm.  No  obvious murmur.  There is an S4.  LUNGS:  Decreased breath sounds throughout but no crackles.  ABDOMEN:  Morbidly obese, nontender, nondistended.  No obvious  hepatosplenomegaly, no bruits and no masses.  Good bowel sounds.  EXTREMITIES:  Warm with no cyanosis or clubbing.  There is 2+ edema  bilaterally below the knees.  NEUROLOGICAL:  She is alert and oriented x3.  Cranial nerves II through XII  intact.  Moves all four extremities without difficulty.  Affect is bright.   LABORATORY DATA:  Sodium 137, potassium 4.4, chloride of 103, bicarb of 29,  BUN of 17, creatinine of 1.3.  CK-MBs have been negative x3 with a peak MB  of 1.9.  Troponins are 0.06, 0.07 and 0.04.  TSH is normal.  BNP is 1100.  EKG shows normal sinus rhythm at a rate of 76.  There is LVH with  repolarization abnormalities.   ASSESSMENT:  1.  Congestive heart failure secondary to presumed nonischemic      cardiomyopathy with an ejection fraction of 40% exacerbated by      medication noncompliance, nonsteroidal use and severe hypertension.      Continues to have volume overload.  2.  Severe hypertension, now improved.  3.  Diabetes mellitus x4 years, well controlled.  4.  Obesity.  5.  Gout.  6.  Hyperlipidemia.  7.  Probable obstructive sleep apnea.   PLAN:  As she still has some volume overload, I would change to IV Lasix for further diuresis.  I would also consider changing her short-acting   metoprolol to either Coreg or bisoprolol/HCTZ 5/12.5 which is available at  [facility] for $4.  You can use a  short burst of prednisone as needed for her  gout if this does not resolve with colchicine.  I do believe she will  probably need an outpatient sleep study.  I suspect we continue to treat her  medically and I will leave the decision regarding the need for cardiac  catheterization to her primary cardiologist, Dr. [name].      Arvilla Meres, M.D. [facility]  Electronically Signed     DB/MEDQ  D:  05/29/2006  T:  05/31/2006  Job:  [id number]

## 2011-07-14 ENCOUNTER — Emergency Department (HOSPITAL_COMMUNITY)
Admission: EM | Admit: 2011-07-14 | Discharge: 2011-07-14 | Disposition: A | Discharge status: Home / Self Care | Payer: Medicare Other | Attending: Emergency Medicine | Admitting: Emergency Medicine

## 2011-07-14 ENCOUNTER — Telehealth: Payer: Self-pay | Admitting: *Deleted

## 2011-07-14 DIAGNOSIS — Z79899 Other long term (current) drug therapy: Secondary | ICD-10-CM | POA: Insufficient documentation

## 2011-07-14 DIAGNOSIS — H269 Unspecified cataract: Secondary | ICD-10-CM | POA: Insufficient documentation

## 2011-07-14 DIAGNOSIS — Z9889 Other specified postprocedural states: Secondary | ICD-10-CM | POA: Insufficient documentation

## 2011-07-14 DIAGNOSIS — I10 Essential (primary) hypertension: Secondary | ICD-10-CM | POA: Insufficient documentation

## 2011-07-14 DIAGNOSIS — I509 Heart failure, unspecified: Secondary | ICD-10-CM | POA: Insufficient documentation

## 2011-07-14 DIAGNOSIS — I2510 Atherosclerotic heart disease of native coronary artery without angina pectoris: Secondary | ICD-10-CM | POA: Insufficient documentation

## 2011-07-14 DIAGNOSIS — H548 Legal blindness, as defined in USA: Secondary | ICD-10-CM | POA: Insufficient documentation

## 2011-07-14 DIAGNOSIS — E119 Type 2 diabetes mellitus without complications: Secondary | ICD-10-CM | POA: Insufficient documentation

## 2011-07-14 DIAGNOSIS — H539 Unspecified visual disturbance: Secondary | ICD-10-CM | POA: Insufficient documentation

## 2011-07-14 LAB — CBC
HCT: 38.4 % (ref 36.0–46.0)
MCH: 30.8 pg (ref 26.0–34.0)
MCV: 89.7 fL (ref 78.0–100.0)
RBC: 4.28 MIL/uL (ref 3.87–5.11)
WBC: 6.6 10*3/uL (ref 4.0–10.5)

## 2011-07-14 LAB — BASIC METABOLIC PANEL
BUN: 15 mg/dL (ref 6–23)
CO2: 26 mEq/L (ref 19–32)
Calcium: 9.3 mg/dL (ref 8.4–10.5)
Chloride: 104 mEq/L (ref 96–112)
Creatinine, Ser: 1.23 mg/dL — ABNORMAL HIGH (ref 0.50–1.10)
Glucose, Bld: 124 mg/dL — ABNORMAL HIGH (ref 70–99)

## 2011-07-14 LAB — URINALYSIS, ROUTINE W REFLEX MICROSCOPIC
Bilirubin Urine: NEGATIVE
Glucose, UA: NEGATIVE mg/dL
Hgb urine dipstick: NEGATIVE
Ketones, ur: NEGATIVE mg/dL
Leukocytes, UA: NEGATIVE
pH: 7.5 (ref 5.0–8.0)

## 2011-07-14 NOTE — Telephone Encounter (Signed)
rec'd message that pt's grdaughter called stating pt was seeing things and people that were not present, i have tried to call the grdaughter and have not been able to reach her, left message to call clinic back

## 2011-07-15 ENCOUNTER — Encounter: Payer: Self-pay | Admitting: Internal Medicine

## 2011-07-15 ENCOUNTER — Ambulatory Visit (INDEPENDENT_AMBULATORY_CARE_PROVIDER_SITE_OTHER): Payer: PRIVATE HEALTH INSURANCE | Admitting: Internal Medicine

## 2011-07-15 VITALS — BP 123/73 | HR 86 | Temp 98.2°F | Ht 64.0 in | Wt 259.0 lb

## 2011-07-15 DIAGNOSIS — E119 Type 2 diabetes mellitus without complications: Secondary | ICD-10-CM

## 2011-07-15 DIAGNOSIS — R441 Visual hallucinations: Secondary | ICD-10-CM

## 2011-07-15 DIAGNOSIS — H5316 Psychophysical visual disturbances: Secondary | ICD-10-CM

## 2011-07-15 HISTORY — DX: Visual hallucinations: R44.1

## 2011-07-15 LAB — GLUCOSE, CAPILLARY: Glucose-Capillary: 148 mg/dL — ABNORMAL HIGH (ref 70–99)

## 2011-07-15 MED ORDER — GLIPIZIDE 10 MG PO TABS: 10.0000 mg | ORAL_TABLET | Freq: Every day | ORAL | Status: DC

## 2011-07-15 MED ORDER — METFORMIN HCL 500 MG PO TABS: 500.0000 mg | ORAL_TABLET | Freq: Two times a day (BID) | ORAL | Status: DC

## 2011-07-15 MED ORDER — HALOPERIDOL 2 MG PO TABS: 2.0000 mg | ORAL_TABLET | Freq: Two times a day (BID) | ORAL | Status: DC

## 2011-07-15 NOTE — Patient Instructions (Addendum)
Start Haldol 2mg  one tablet twice daily When you use your eye-drop, please put light pressure on the corners of your eye with your finger for a few minutes. Follow up with Dr. Phillips Odor in 1 week

## 2011-07-15 NOTE — Progress Notes (Signed)
History of present illness: [name] is a 75 year old woman with past medical history of CHF, hypertension, diabetes type 2, CAD, proliferative retinopathy presents today for one-year history of hallucination.  Patient was accompanied by her granddaughter who provided most of the history.  The patient has been having visual hallucinations such as seeing a green bush in the room with several women standing around.  She also reported seeing a whole [employer] holding guns against her. Her her granddaughter's report, patient began to hallucinate after her right eye surgery and that patient has been terrified. Patient has been very agitated at home secondary to her hallucination; therefore, her family brought her to the emergency room yesterday. She was sent home and was told to followup today with her PCP. Unfortunately, Dr. Phillips Odor is not in clinic this afternoon.  She denies any fever or chills nausea or vomiting, headaches, recent illnesses/infection, or any other systemic symptoms. She denies any auditory hallucination.  She remembers what she eats for breakfast and can recall 3 items for short-term memories.  Patient is alert and oriented x3.  She does have a family history of schizophrenia in her youngest daughter but denies any personal history of any psychiatric disorders.  As for her diabetes, granddaughter report that she has been taking metformin and glipizide. Her blood sugars at home range in the 100-150's.  ROS: As per HPI  PE: General: alert, well-developed sitting in wheelchair, cannot see her right eye, and cooperative to examination.  Lungs: normal respiratory effort, no accessory muscle use, normal breath sounds, no crackles, and no wheezes. Heart: normal rate, regular rhythm, no murmur, no gallop, and no rub.  Abdomen: soft, non-tender, normal bowel sounds, no distention, no guarding, no rebound tenderness Msk: no joint swelling, no joint warmth, and no redness over joints.  Extremities: No  cyanosis, clubbing,+2 non-pitting edema Neurologic: alert & oriented X3, cranial nerves II-XII intact, strength normal in all extremities, sensation intact to light touch, and gait normal.  Skin: turgor normal and no rashes.  Psych: Oriented X3, memory intact for recent and remote, normally interactive, good eye contact, slightly anxious but not appearing.  Continues to report seeing things and persons while in examine room.

## 2011-07-15 NOTE — Assessment & Plan Note (Signed)
Patient has a history of visual hallucination in the past one year which has worsened over the past few days. I discuss with Dr. Phillips Odor over the telephone and we do not think that this is to be an organic psychiatric disorder.  There might be some underlying causes which may cause her to hallucinate such as infection, hypoglycemia. After discussing the side effects of Haldol such as extrapyramidal symptoms/tardive dyskinesia with her family including her son's and orders and granddaughters in the room, they have decided to try a short course of Haldol.  -Will start Haldol 2mg  po bid -Instructed patient to put her finger on the corner of her eye medially whenever she uses Cosopt drop. -Will follow up with Dr. Phillips Odor in 1 week -I asked patient's grand-daughter to check her blood sugar at least 4 times per day for the next week so that we can determine if she indeed has any hypoglycemic episodes.

## 2011-07-15 NOTE — Telephone Encounter (Signed)
Patient had this happen one other time. She is legally blind-she needs to be seen.

## 2011-07-15 NOTE — Assessment & Plan Note (Signed)
CBG today was 148. Hemoglobin A1c was done in March 2012 was 6.5.  Patient granddaughter reports that she has been taking metformin 500 mg by mouth twice a day and glipizide 10 mg by mouth daily.  Will continue current medication and have patient check her blood sugar at least 4 times daily in the next week so that she can followup with Dr. Phillips Odor in one week to determine if patient has hypoglycemic episodes. We will need to repeat her hemoglobin A1c at next office visit.

## 2011-07-30 ENCOUNTER — Ambulatory Visit: Payer: Medicare Other | Admitting: Internal Medicine

## 2011-07-31 ENCOUNTER — Ambulatory Visit: Payer: PRIVATE HEALTH INSURANCE | Admitting: Licensed Clinical Social Worker

## 2011-07-31 ENCOUNTER — Encounter: Payer: Self-pay | Admitting: Internal Medicine

## 2011-07-31 ENCOUNTER — Ambulatory Visit (INDEPENDENT_AMBULATORY_CARE_PROVIDER_SITE_OTHER): Payer: PRIVATE HEALTH INSURANCE | Admitting: Internal Medicine

## 2011-07-31 VITALS — BP 125/66 | HR 90 | Temp 100.4°F | Ht 64.0 in | Wt 262.5 lb

## 2011-07-31 DIAGNOSIS — L0233 Carbuncle of buttock: Secondary | ICD-10-CM

## 2011-07-31 DIAGNOSIS — E11359 Type 2 diabetes mellitus with proliferative diabetic retinopathy without macular edema: Secondary | ICD-10-CM

## 2011-07-31 DIAGNOSIS — H5316 Psychophysical visual disturbances: Secondary | ICD-10-CM

## 2011-07-31 DIAGNOSIS — H543 Unqualified visual loss, both eyes: Secondary | ICD-10-CM

## 2011-07-31 DIAGNOSIS — E119 Type 2 diabetes mellitus without complications: Secondary | ICD-10-CM

## 2011-07-31 DIAGNOSIS — R441 Visual hallucinations: Secondary | ICD-10-CM

## 2011-07-31 DIAGNOSIS — E785 Hyperlipidemia, unspecified: Secondary | ICD-10-CM

## 2011-07-31 DIAGNOSIS — H547 Unspecified visual loss: Secondary | ICD-10-CM

## 2011-07-31 DIAGNOSIS — G4700 Insomnia, unspecified: Secondary | ICD-10-CM

## 2011-07-31 DIAGNOSIS — L0232 Furuncle of buttock: Secondary | ICD-10-CM

## 2011-07-31 DIAGNOSIS — E1139 Type 2 diabetes mellitus with other diabetic ophthalmic complication: Secondary | ICD-10-CM

## 2011-07-31 MED ORDER — MUPIROCIN 2 % EX OINT: TOPICAL_OINTMENT | Freq: Three times a day (TID) | CUTANEOUS | Status: AC

## 2011-07-31 MED ORDER — ALPRAZOLAM 0.5 MG PO TABS: 0.5000 mg | ORAL_TABLET | Freq: Every evening | ORAL | Status: DC | PRN

## 2011-07-31 MED ORDER — DOXYCYCLINE HYCLATE 100 MG PO CAPS: 100.0000 mg | ORAL_CAPSULE | Freq: Two times a day (BID) | ORAL | Status: AC

## 2011-08-03 NOTE — Progress Notes (Signed)
30 minutes.  Met with patient who is 75 yo AA patient with multiple medical issues.  She is here with her family--granddaughter [name] [phone]) and [name] (son).  Discussed placement since family admits that they can no longer take care of patient at home due to blindness and needs help with virtually all ADL's.  Family works during the day.  Multiple health issues including DMII with complications, CHF, HTN, Gout, Obesity, and partial blindness.   Family was instructed to bring all income information and identification to DSS to apply for skilled nursing Medicaid.  Pt income is $973 from Soc. Security and $570 from pension.  Family will visit several SNF's to see what are their preferred nursing homes.  [name] (oldest daughter) will call social work for further discussion.

## 2011-08-04 DIAGNOSIS — L0232 Furuncle of buttock: Secondary | ICD-10-CM | POA: Insufficient documentation

## 2011-08-04 DIAGNOSIS — G4700 Insomnia, unspecified: Secondary | ICD-10-CM | POA: Insufficient documentation

## 2011-08-04 NOTE — Assessment & Plan Note (Signed)
Very stable. Now diet and medication controlled.

## 2011-08-04 NOTE — Assessment & Plan Note (Signed)
Will treat with Doxy given infected appearance. F/U if not improved. Advised her to use warm compresses 3 times a day. May use Mupirocin topically.

## 2011-08-04 NOTE — Progress Notes (Signed)
  Subjective:    Patient ID: [name], female    DOB: 11-Aug-1936, 75 y.o.   MRN: [id number]  HPI Patient comes in today with complaints of continued hallucinations and worsening vision loss. Her granddaughter is with her he confirms her reports of seeing "dead people". She also has hallucinated very scary scenes that she has read about in a church related environment. She reports that these hallucinations started after she recently became a "mother" in the [facility]. She is aware that they are not real but she says they are nonetheless very frightening. In addition to this problem she has had worsening of her vision she says she has almost complete vision loss in both eyes. This has created quite a bit of strain on her granddaughter who is her primary caregiver her granddaughter is asking today about options for assisted living-type care arrangements. The patient was started on Haldol at a previous visit in response to these hallucinations- she reports that this has significantly improved the occurrence, and has allowed her to rest at night, but is still not completely resolved. The patient has not been able to correlate any physiological change such as hypoglycemia with the events of hallucination. There have been no recent medication changes. And no underlying infections. He does not have any significant past psychiatric medical history. Otherwise her thoughts judgment and insight into reality are intact and very good. In addition to this problem she complains of a "boil on her bottom"she has had tenderness and pain and noticed some purulent discharge on her underwear.    Review of Systems Per history of present illness. The patient does not complaining of any chest pain shortness of breath nausea vomiting diarrhea. No fevers.    Objective:   Physical Exam  Nursing note and vitals reviewed. Constitutional: She is oriented to person, place, and time. She appears well-developed and  well-nourished. No distress.  HENT:  Head: Normocephalic and atraumatic.  Cardiovascular: Normal rate, regular rhythm and normal heart sounds.   Pulmonary/Chest: Effort normal and breath sounds normal.  Abdominal: Soft. Bowel sounds are normal. She exhibits no distension. There is no tenderness.  Musculoskeletal: She exhibits no edema and no tenderness.  Neurological: She is alert and oriented to person, place, and time.  Skin: Skin is warm and dry. No rash noted. No erythema.       There is a small round swollen boil on her left perineal region close to the left buttock crease it is mildly irritated and has a small amount of purulent drainage.  Psychiatric: She has a normal mood and affect. Her behavior is normal.       Patient has intact judgment, thought, focus. She is alert and oriented to person time and place. She does not have pressured speech. She is not actively having any suicidal ideation homicidal ideation or hallucinations.          Assessment & Plan:

## 2011-08-04 NOTE — Assessment & Plan Note (Signed)
This is an ongoing problem for the patient. It appears to be a form of psychosis however there are no overt signs of disorientation and the patient has a good awareness of the problem. I have questioned if this is related somehow to her vision loss in the loss of sensory input. This is also correlated with her joining of the [facility] and I wonder if this type of stimulation has not triggered some kind of hallucinations related to her experiences. Do not think this warrants a complete psychiatric evaluation as it appears to be more a less a frightening experience instead of seriouslyaltering her current quality of life and functional status. Interestingly enough the Haldol does appear to be helping so we will continue this. I educated the patient and her granddaughter on the extrapyramidal side effects that may occur with prolonged use. Continue to monitor this problem closely. A workup for delirium or underlying causes of delirium was negative.

## 2011-08-04 NOTE — Assessment & Plan Note (Signed)
The patient reports having difficulty sleeping at night although this is improved with the addition of Haldol. I will prescribe her a low dose alprazolam to be used only as needed if she cannot sleep.

## 2011-08-04 NOTE — Assessment & Plan Note (Signed)
No change. Medication controlled.

## 2011-08-04 NOTE — Assessment & Plan Note (Signed)
Clearly worsening of her blindness at today's visit. She is followed regularly by ophthalmology. At this point, her near complete vision loss has created a situation where is very difficult for her to perform her own ADLs at home. We're currently going to assist the patient and her granddaughter with options for assisted living.

## 2011-09-17 ENCOUNTER — Other Ambulatory Visit: Payer: Self-pay | Admitting: Internal Medicine

## 2011-09-21 LAB — CBC
HCT: 40.8
Hemoglobin: 13.2
MCHC: 32.4
Platelets: 211
RDW: 14.9

## 2011-09-21 LAB — B-NATRIURETIC PEPTIDE (CONVERTED LAB): Pro B Natriuretic peptide (BNP): 178 — ABNORMAL HIGH

## 2011-09-21 LAB — DIFFERENTIAL
Basophils Absolute: 0
Basophils Relative: 0
Eosinophils Relative: 2
Monocytes Absolute: 0.4

## 2011-09-21 LAB — POCT CARDIAC MARKERS
CKMB, poc: <1 — ABNORMAL LOW
CKMB, poc: <1 — ABNORMAL LOW
Troponin i, poc: <0.05
Troponin i, poc: <0.05

## 2011-09-21 LAB — BASIC METABOLIC PANEL
BUN: 20
CO2: 28
Calcium: 8.6
GFR calc non Af Amer: 48 — ABNORMAL LOW
Glucose, Bld: 168 — ABNORMAL HIGH
Potassium: 4.3
Sodium: 136

## 2011-09-21 LAB — GLUCOSE, CAPILLARY: Glucose-Capillary: 153 — ABNORMAL HIGH

## 2011-11-19 ENCOUNTER — Encounter: Payer: Self-pay | Admitting: Internal Medicine

## 2011-11-19 ENCOUNTER — Ambulatory Visit (INDEPENDENT_AMBULATORY_CARE_PROVIDER_SITE_OTHER): Payer: Medicare Other | Admitting: Internal Medicine

## 2011-11-19 ENCOUNTER — Ambulatory Visit: Payer: Medicare Other | Admitting: Licensed Clinical Social Worker

## 2011-11-19 VITALS — Temp 98.5°F | Ht 64.0 in | Wt 269.8 lb

## 2011-11-19 DIAGNOSIS — Z0000 Encounter for general adult medical examination without abnormal findings: Secondary | ICD-10-CM | POA: Insufficient documentation

## 2011-11-19 DIAGNOSIS — H547 Unspecified visual loss: Secondary | ICD-10-CM

## 2011-11-19 DIAGNOSIS — H5316 Psychophysical visual disturbances: Secondary | ICD-10-CM

## 2011-11-19 DIAGNOSIS — K219 Gastro-esophageal reflux disease without esophagitis: Secondary | ICD-10-CM

## 2011-11-19 DIAGNOSIS — Z23 Encounter for immunization: Secondary | ICD-10-CM

## 2011-11-19 DIAGNOSIS — E11359 Type 2 diabetes mellitus with proliferative diabetic retinopathy without macular edema: Secondary | ICD-10-CM

## 2011-11-19 DIAGNOSIS — I10 Essential (primary) hypertension: Secondary | ICD-10-CM

## 2011-11-19 DIAGNOSIS — E1139 Type 2 diabetes mellitus with other diabetic ophthalmic complication: Secondary | ICD-10-CM

## 2011-11-19 DIAGNOSIS — E119 Type 2 diabetes mellitus without complications: Secondary | ICD-10-CM

## 2011-11-19 DIAGNOSIS — R441 Visual hallucinations: Secondary | ICD-10-CM

## 2011-11-19 DIAGNOSIS — E785 Hyperlipidemia, unspecified: Secondary | ICD-10-CM

## 2011-11-19 LAB — POCT GLYCOSYLATED HEMOGLOBIN (HGB A1C): Hemoglobin A1C: 7.3

## 2011-11-19 MED ORDER — ESOMEPRAZOLE MAGNESIUM 40 MG PO CPDR: 40.0000 mg | DELAYED_RELEASE_CAPSULE | Freq: Every day | ORAL | Status: DC

## 2011-11-19 MED ORDER — CARVEDILOL 25 MG PO TABS: 25.0000 mg | ORAL_TABLET | Freq: Two times a day (BID) | ORAL | Status: DC

## 2011-11-19 MED ORDER — SIMVASTATIN 20 MG PO TABS: 20.0000 mg | ORAL_TABLET | Freq: Every day | ORAL | Status: DC

## 2011-11-19 NOTE — Assessment & Plan Note (Addendum)
Patient and granddaughter report resolution and visual hallucinations. She believes that this was brought on by social stressors such as the patient's friends had a leg amputation and a mass shooting in [place] that was shown on the news frequently.

## 2011-11-19 NOTE — Assessment & Plan Note (Signed)
She is to follow up with her eye doc, next appointment is February 2013.

## 2011-11-19 NOTE — Patient Instructions (Signed)
It was nice to meet you today, [name] received the flu shot and tetanus/whooping cough shot today. Please continue taking medications as prescribed.  As discussed, the social worker will continue to check for resources that can help in the home. Keep your scheduled appointment with your eye doctor in February 08, 2012 We will see you again in 3-4 months.

## 2011-11-19 NOTE — Progress Notes (Signed)
  Subjective:    Patient ID: [name], female    DOB: Jan 22, 1936, 75 y.o.   MRN: [id number]  HPI  Patient presents today with her granddaughter for followup of visual hallucinations experienced in August 2012. They report the symptoms have nearly resolved. No complaints on exam today. Also requesting refill of medications.  Review of Systems     Objective:   Physical Exam  Constitutional: She is oriented to person, place, and time. She appears well-developed and well-nourished. No distress.       Elderly, obese, pleasant, blind, in wheel chair accompanied by grandaughter  HENT:  Head: Normocephalic and atraumatic.  Neck: Normal range of motion. Neck supple.  Cardiovascular: Normal rate, regular rhythm, normal heart sounds and intact distal pulses.   Pulmonary/Chest: Effort normal and breath sounds normal. She exhibits no tenderness.  Abdominal: Soft. Bowel sounds are normal.  Neurological: She is alert and oriented to person, place, and time.  Skin: Skin is warm and dry.          Assessment & Plan:  #1 visual hallucinations: The patient and granddaughter report that these have greatly improved and nearly ceased. Thought is that it was due to outside stress for example friend having leg agitation and recent mass murder elsewhere in the nation on the news.  #2 the patient and granddaughter reports that she is no longer in need for an assisted living facility. Multiple family members and friends are visiting patient's home to assist with daily living activities. Social Worker Rosendo Gros will inquire as to the reason pt was denied home aide to assist with ADLs given that she is blind and wheelchair bound.  #3 preventive health care: Patient received flu shot and tDap vaccination today.

## 2011-11-19 NOTE — Assessment & Plan Note (Signed)
BP controlled with losartan 25 mg daily and blood pressure 124/70 today

## 2011-11-19 NOTE — Assessment & Plan Note (Signed)
Patient given flu and tDap Vaccinations today.

## 2011-11-19 NOTE — Progress Notes (Signed)
Referred by Dr. Bosie Clos to explore at home options for care.  Patient is blind, diabetic and comes to appointment in w/c today.  Lives w/ 73 yo son in a home owned by a cousin.  Her son does not work and she said he is not well and trying to get disability.  Family known to social work per previous visit.  At prior visit, granddaughter and son were very interested in nursing home placement however the patient later decided she was not ready to do this and didn't want the SNF to take most of her money.   Patient and granddaughter Glee Arvin are inquiring today about inhome aide options.  I explained to Ms. Recchia that she is overincome for Medicaid and so we cannot obtain an aide for her free of charge. The patient was very angry about this and rightfully so.    Family Support;  There is a great deal of family support to include three daughters, sons (one who lives w/ her) and granddaughters.  She has an 71 yo young lady come in a few times per week and pays $100 out of pocket for assistance.  She also has neighbors who check in w/ her.   Church:  She is connected to a [employer] and does not think they could help w/ regard to care.   Unfortunately I did not have any free options for care and could only offer future assistance w/ SNF placement which the patient had decided against.

## 2011-11-19 NOTE — Assessment & Plan Note (Addendum)
Hemoglobin A1c 7.3 today with fingerstick of 163. Continue with her current therapy glipizide 10 mg daily and metformin 500 mg and reassess at next PCP followup.

## 2011-11-29 ENCOUNTER — Other Ambulatory Visit: Payer: Self-pay

## 2011-11-29 ENCOUNTER — Encounter (HOSPITAL_COMMUNITY): Payer: Self-pay | Admitting: *Deleted

## 2011-11-29 ENCOUNTER — Emergency Department (HOSPITAL_COMMUNITY): Payer: Medicare Other

## 2011-11-29 ENCOUNTER — Emergency Department (HOSPITAL_COMMUNITY)
Admission: EM | Admit: 2011-11-29 | Discharge: 2011-11-29 | Disposition: A | Discharge status: Home Health | Payer: Medicare Other | Attending: Internal Medicine | Admitting: Internal Medicine

## 2011-11-29 DIAGNOSIS — Z79899 Other long term (current) drug therapy: Secondary | ICD-10-CM | POA: Insufficient documentation

## 2011-11-29 DIAGNOSIS — I2510 Atherosclerotic heart disease of native coronary artery without angina pectoris: Secondary | ICD-10-CM | POA: Insufficient documentation

## 2011-11-29 DIAGNOSIS — I509 Heart failure, unspecified: Secondary | ICD-10-CM | POA: Insufficient documentation

## 2011-11-29 DIAGNOSIS — I10 Essential (primary) hypertension: Secondary | ICD-10-CM | POA: Insufficient documentation

## 2011-11-29 DIAGNOSIS — Z951 Presence of aortocoronary bypass graft: Secondary | ICD-10-CM | POA: Insufficient documentation

## 2011-11-29 DIAGNOSIS — R609 Edema, unspecified: Secondary | ICD-10-CM

## 2011-11-29 DIAGNOSIS — M7989 Other specified soft tissue disorders: Secondary | ICD-10-CM | POA: Insufficient documentation

## 2011-11-29 DIAGNOSIS — E119 Type 2 diabetes mellitus without complications: Secondary | ICD-10-CM | POA: Insufficient documentation

## 2011-11-29 DIAGNOSIS — R0601 Orthopnea: Secondary | ICD-10-CM | POA: Insufficient documentation

## 2011-11-29 DIAGNOSIS — Z7982 Long term (current) use of aspirin: Secondary | ICD-10-CM | POA: Insufficient documentation

## 2011-11-29 DIAGNOSIS — R1900 Intra-abdominal and pelvic swelling, mass and lump, unspecified site: Secondary | ICD-10-CM | POA: Insufficient documentation

## 2011-11-29 HISTORY — DX: Essential (primary) hypertension: I10

## 2011-11-29 HISTORY — DX: Atherosclerotic heart disease of native coronary artery without angina pectoris: I25.10

## 2011-11-29 LAB — COMPREHENSIVE METABOLIC PANEL
Alkaline Phosphatase: 105 U/L (ref 39–117)
BUN: 20 mg/dL (ref 6–23)
GFR calc Af Amer: 48 mL/min — ABNORMAL LOW (ref >90)
GFR calc non Af Amer: 41 mL/min — ABNORMAL LOW (ref >90)
Glucose, Bld: 111 mg/dL — ABNORMAL HIGH (ref 70–99)
Potassium: 4.5 mEq/L (ref 3.5–5.1)
Total Protein: 7.4 g/dL (ref 6.0–8.3)

## 2011-11-29 LAB — CBC
Hemoglobin: 11.7 g/dL — ABNORMAL LOW (ref 12.0–15.0)
MCH: 29.2 pg (ref 26.0–34.0)
MCV: 91.3 fL (ref 78.0–100.0)
RBC: 4.01 MIL/uL (ref 3.87–5.11)

## 2011-11-29 LAB — DIFFERENTIAL
Eosinophils Absolute: 0.1 10*3/uL (ref 0.0–0.7)
Eosinophils Relative: 2 % (ref 0–5)
Lymphs Abs: 1.7 10*3/uL (ref 0.7–4.0)
Monocytes Relative: 9 % (ref 3–12)
Neutrophils Relative %: 57 % (ref 43–77)

## 2011-11-29 LAB — PRO B NATRIURETIC PEPTIDE: Pro B Natriuretic peptide (BNP): 660.8 pg/mL — ABNORMAL HIGH (ref 0–125)

## 2011-11-29 LAB — CARDIAC PANEL(CRET KIN+CKTOT+MB+TROPI): Troponin I: <0.3 ng/mL (ref <0.30)

## 2011-11-29 MED ORDER — FUROSEMIDE 10 MG/ML IJ SOLN: 40.0000 mg | Freq: Once | INTRAMUSCULAR | Status: DC

## 2011-11-29 MED ORDER — FUROSEMIDE 20 MG PO TABS
40.0000 mg | ORAL_TABLET | Freq: Every day | ORAL | Status: DC
  Administered 2011-11-29: 40 mg via ORAL

## 2011-11-29 MED ORDER — FUROSEMIDE 10 MG/ML IJ SOLN
INTRAMUSCULAR | Status: AC
  Filled 2011-11-29: qty 4

## 2011-11-29 MED ORDER — FUROSEMIDE 20 MG PO TABS
ORAL_TABLET | ORAL | Status: AC
  Administered 2011-11-29: 40 mg via ORAL
  Filled 2011-11-29: qty 4

## 2011-11-29 NOTE — ED Notes (Addendum)
Patient up to [facility] and tolerated well. Patient placed back on monitor and is now resting. Family at bedside. Urine sample labeled and placed in bio-bag sitting on sink for future if needed.

## 2011-11-29 NOTE — Consult Note (Signed)
Please see dictated consult note.  Patient will follow up with Dr. Estrella Myrtle in 3-4 days for reevaluation and check of renal function.  Increase lasix to 40mg  PO daily.

## 2011-11-29 NOTE — ED Notes (Addendum)
Note patient is legally blind in one eye and totally blind in the other eye. Patient states that she has had swelling in her legs and feet x 2 weeks and her abdomen feels tight. Patient denies chest pain, N/V or abdominal pain and no SOB. Patient states that her hands and fingers feel numb and tingly. Patient states she takes two fluid pills per day and they have been making her urinate as usual. Patient denies pain or burning when urinating. Patient resting with NAD at this time.

## 2011-11-29 NOTE — ED Provider Notes (Signed)
History     CSN: [id number] Arrival date & time: 11/29/2011  2:42 PM   First MD Initiated Contact with Patient 11/29/11 1538      Chief Complaint  Patient presents with  . Leg Swelling    HPI  75yoF history of nonischemic cardiomyopathy with EF of approximately 35%, non-insulin-dependent diabetes, hypertension presents with leg swelling. She reports moderately increased leg swelling over the past one week. She denies shortness of breath. He has chronic orthopnea which is unchanged from previous. No paroxysmal nocturnal dyspnea. She denies chest pain. Denies fever, cough, chills. Triage notes well into her arms and legs, abdomen the patient states that she did not feel like she's had swelling in her upper extremities. She does not feel like her abdomen is distended. She complains of no abdominal pain, nausea, vomiting. She does not have any diarrhea.     Arta Silence, RN 11/29/2011 14:51     Reports swelling to legs, arms and abd x 1 week, just doesn't feel good. No resp distress noted at triage, moderate swelling noted.     Past Medical History  Diagnosis Date  . Coronary artery disease   . Diabetes mellitus   . Hypertension     Past Surgical History  Procedure Date  . Coronary angioplasty with stent placement     History reviewed. No pertinent family history.  History  Substance Use Topics  . Smoking status: Former Smoker    Types: Cigarettes    Quit date: 12/21/1978  . Smokeless tobacco: Not on file  . Alcohol Use: No    OB History    Grav Para Term Preterm Abortions TAB SAB Ect Mult Living                  Review of Systems  All other systems reviewed and are negative.   except as noted HPI   Allergies  Lisinopril  Home Medications   Current Outpatient Rx  Name Route Sig Dispense Refill  . AMLODIPINE BESYLATE 10 MG PO TABS Oral Take 10 mg by mouth daily.      . ASPIRIN EC 81 MG PO TBEC Oral Take 81 mg by mouth daily.      Marland Kitchen CARVEDILOL 25 MG PO  TABS Oral Take 1 tablet (25 mg total) by mouth 2 (two) times daily. 180 tablet 6  . DORZOLAMIDE HCL-TIMOLOL MAL 22.3-6.8 MG/ML OP SOLN Both Eyes Place 1 drop into both eyes 2 (two) times daily. 10 mL 1  . ESOMEPRAZOLE MAGNESIUM 40 MG PO CPDR Oral Take 1 capsule (40 mg total) by mouth daily. 90 capsule 6  . FUROSEMIDE 20 MG PO TABS Oral Take 20 mg by mouth 2 (two) times daily.      Marland Kitchen GLIPIZIDE 10 MG PO TABS Oral Take 1 tablet (10 mg total) by mouth daily. 30 tablet 11  . LOSARTAN POTASSIUM 25 MG PO TABS Oral Take 25 mg by mouth daily.     Marland Kitchen SIMVASTATIN 20 MG PO TABS Oral Take 1 tablet (20 mg total) by mouth at bedtime. 30 tablet 6    REFILLED ONCE ON  9/27....ANY ADDITIONAL REFILLS?  Marland Kitchen BRIMONIDINE TARTRATE 0.15 % OP SOLN Both Eyes Place 1 drop into both eyes as directed. 15 mL 0  . METFORMIN HCL 500 MG PO TABS Oral Take 1 tablet (500 mg total) by mouth 2 (two) times daily with a meal. 60 tablet 11    BP 149/64  Pulse 76  Temp(Src) 98.4 F (36.9 C) (  Oral)  Resp 18  Ht 5\' 4"  (1.626 m)  Wt 269 lb (122.018 kg)  BMI 46.17 kg/m2  SpO2 100%  Physical Exam  Nursing note and vitals reviewed. Constitutional: She is oriented to person, place, and time. She appears well-developed.  HENT:  Head: Atraumatic.  Mouth/Throat: Oropharynx is clear and moist.  Eyes: Conjunctivae and EOM are normal. Pupils are equal, round, and reactive to light.  Neck: Normal range of motion. Neck supple.  Cardiovascular: Normal rate, regular rhythm, normal heart sounds and intact distal pulses.   Pulmonary/Chest: Effort normal and breath sounds normal. No respiratory distress. She has no wheezes. She has no rales.       Min bibasilar crackles  Abdominal: Soft. She exhibits no distension. There is no tenderness. There is no rebound and no guarding.  Musculoskeletal: Normal range of motion. She exhibits edema. She exhibits no tenderness.       B/l 3+ pitting edema lower extr  Neurological: She is alert and oriented to  person, place, and time.  Skin: Skin is warm and dry. No rash noted.  Psychiatric: She has a normal mood and affect.    Date: 11/29/2011  Rate: 76  Rhythm: normal sinus rhythm  QRS Axis: normal  Intervals: QT prolonged  ST/T Wave abnormalities: nonspecific T wave changes  Conduction Disutrbances:none  Narrative Interpretation: Borderline prolonged qtc 50 borderline repol abnl, paired pvcs   ED Course  Procedures (including critical care time)  Labs Reviewed  CBC - Abnormal; Notable for the following:    Hemoglobin 11.7 (*)    All other components within normal limits  COMPREHENSIVE METABOLIC PANEL - Abnormal; Notable for the following:    Glucose, Bld 111 (*)    Creatinine, Ser 1.26 (*)    Albumin 3.4 (*)    GFR calc non Af Amer 41 (*)    GFR calc Af Amer 48 (*)    All other components within normal limits  PRO B NATRIURETIC PEPTIDE - Abnormal; Notable for the following:    BNP, POC 660.8 (*)    All other components within normal limits  DIFFERENTIAL  CARDIAC PANEL(CRET KIN+CKTOT+MB+TROPI)   Dg Chest 2 View  11/29/2011  *RADIOLOGY REPORT*  Clinical Data: Diffuse body swelling.  CHEST - 2 VIEW  Comparison: Chest 04/18/2011.  Findings: There is cardiomegaly but no pulmonary edema.  No focal airspace disease or pleural effusion.  IMPRESSION: Cardiomegaly without acute disease.  Original Report Authenticated By: Bernadene Bell. D'ALESSIO, M.D.    1. Congestive heart failure     MDM  Patient likely has been a mild CHF exacerbation. Will check CXR, labs, reassess.  Labs reviewed and remarkable for slightly elevated pro BNP and slightly worsening renal function from baseline. The patient continues to feel well. She has been evaluated by cardiology who agrees with mild CHF exacerbation. She will get Lasix in the emergency department and her daily dose of Lasix will be increased from 20 mg daily to 40 mg daily. She is to follow with her cardiologist within 3-4 days for recheck of her  renal function and her mild CHF exacerbation.      Forbes Cellar, MD 11/29/11 [phone]

## 2011-11-29 NOTE — Consult Note (Signed)
[name], [name] NO.:  [id number]  MEDICAL RECORD NO.:  [id number]  LOCATION:  [facility]                        FACILITY:  [facility]  PHYSICIAN:  [name] Bence, MD       DATE OF BIRTH:  May 22, 1936  DATE OF CONSULTATION: DATE OF DISCHARGE:                                CONSULTATION   PRIMARY CARDIOLOGIST:  [name] Pick. [name], MD, [facility]  REASON FOR CONSULTATION:  Lower extremity edema.  HISTORY OF PRESENT ILLNESS:  This is a 75 year old obese white female with a cardiomyopathy with an EF in the range of 30-35%, who presented to the ER tonight with complaints of worsening lower extremity edema. She is on chronic furosemide therapy at 20 mg a day.  She thinks she has gained a little bit of weight overall but has noticed some dependent lower extremity edema bilaterally.  She denies any chest discomfort or shortness of breath.  She has not had any worsening dyspnea on exertion, PND, orthopnea, although she does complain of 2 pillow orthopnea.  This has been stable for some time.  She denies any lightheadedness or dizziness.  She has not tried any increase in her Lasix dose, until today she took 1 dose of 40 mg p.o.  Otherwise, she has no complaints and is doing well.  She denies any bleeding, any fevers, chills, any other complaints.  PAST MEDICAL HISTORY: 1. Type 2 diabetes. 2. Coronary artery disease. 3. Ischemic cardiomyopathy. 4. Hypertension. 5. Diabetic retinopathy. 6. Dyslipidemia. 7. Chronic renal insufficiency.  FAMILY HISTORY:  Noncontributory.  SOCIAL HISTORY:  She is a former smoker, quit in the 83s.  She denies any alcohol or illicit drug use.  ALLERGIES:  She is allergic to LISINOPRIL.  MEDICATIONS: 1. Norvasc 10 mg p.o. daily. 2. Aspirin 81 mg p.o. daily. 3. Coreg 25 mg p.o. b.i.d. 4. She is on Cosopt eye drops 1 drop in each eyes 2 times a day. 5. She is on Nexium 40 mg daily. 6. She is on Lasix 20 mg once daily. 7. She is on losartan 25  mg p.o. daily. 8. Glipizide 10 mg p.o. daily. 9. Zocor 20 mg p.o. daily. 10.Metformin 500 mg p.o. b.i.d.  PHYSICAL EXAMINATION:  VITAL SIGNS:  Afebrile, temperature 98.4, pulse is 76 and regular, respiratory rate of 18, O2 sats of 100% on room air, blood pressure 149/64. GENERAL:  She is an obese black female in no apparent distress. EYES:  Anicteric sclerae. NECK:  Normal jugular venous pressure.  No carotid bruits. LUNGS:  Clear to auscultation bilaterally. CARDIOVASCULAR:  Regular rate and rhythm.  No murmurs, rubs, or gallops. She has symmetrical pulses throughout. ABDOMEN:  Obese, soft, nontender. EXTREMITIES:  Warm with pitting edema to her knees bilaterally.  LABORATORY DATA:  Sodium 136, potassium 4.5, chloride of 102, bicarb of 26, BUN of 20, creatinine of 1.26, calcium of 9.0.  Troponin is less than 0.3.  BNP is 661.  White blood cell count 5.4, hematocrit of 37, platelet count of 188.  Chest x-ray shows no pleural effusions or Currently, on low-dose Lasix taking 20 mg once daily.  We will go ahead and give her a dose of 40 mg IV in the ER  while she is here this afternoon and then increase her dose of Lasix to 40 mg a day.  She was instructed to keep a log of her daily weights.  She does have infiltrates.  EKG shows sinus mechanism with a rate of 76 beats per minute.  She has PVCs and nonspecific ST changes.  IMPRESSION AND PLAN:  This is a 75 year old white female with multiple medical problems and known cardiomyopathy, last measured ejection fraction was 30-35%.  She presents with worsening lower extremity edema. She does not have any respiratory complaints at this time.  mild chronic renal insufficiency.  She has had problems with what sounds like over diuresis in the past, so we will bring her back in the clinic later on this week to evaluate her renal function and her volume status at that point.  We will get her back in the office to see Dr. Eden Bowman. Otherwise, we  will make no changes to her medications at this point in time.  She is instructed to seek medical care if she experiences any shortness of breath or lightheadedness.          ______________________________ [name] Bence, MD     MH/MEDQ  D:  11/29/2011  T:  11/29/2011  Job:  [id number]

## 2011-11-29 NOTE — ED Notes (Signed)
Reports swelling to legs, arms and abd x 1 week, just doesn't feel good. No resp distress noted at triage, moderate swelling noted.

## 2011-12-03 ENCOUNTER — Ambulatory Visit: Payer: Medicare Other | Admitting: Physician Assistant

## 2011-12-23 ENCOUNTER — Encounter: Payer: Self-pay | Admitting: Physician Assistant

## 2011-12-24 ENCOUNTER — Ambulatory Visit: Payer: Medicare Other | Admitting: Physician Assistant

## 2012-01-09 ENCOUNTER — Other Ambulatory Visit: Payer: Self-pay

## 2012-01-09 ENCOUNTER — Emergency Department (HOSPITAL_COMMUNITY): Payer: Medicare Other

## 2012-01-09 ENCOUNTER — Encounter (HOSPITAL_COMMUNITY): Payer: Self-pay | Admitting: *Deleted

## 2012-01-09 ENCOUNTER — Emergency Department (HOSPITAL_COMMUNITY)
Admission: EM | Admit: 2012-01-09 | Discharge: 2012-01-10 | Disposition: A | Discharge status: Home / Self Care | Payer: Medicare Other | Attending: Emergency Medicine | Admitting: Emergency Medicine

## 2012-01-09 DIAGNOSIS — K5900 Constipation, unspecified: Secondary | ICD-10-CM | POA: Insufficient documentation

## 2012-01-09 DIAGNOSIS — I10 Essential (primary) hypertension: Secondary | ICD-10-CM | POA: Insufficient documentation

## 2012-01-09 DIAGNOSIS — I2510 Atherosclerotic heart disease of native coronary artery without angina pectoris: Secondary | ICD-10-CM | POA: Insufficient documentation

## 2012-01-09 DIAGNOSIS — R609 Edema, unspecified: Secondary | ICD-10-CM | POA: Insufficient documentation

## 2012-01-09 DIAGNOSIS — E119 Type 2 diabetes mellitus without complications: Secondary | ICD-10-CM | POA: Insufficient documentation

## 2012-01-09 DIAGNOSIS — I509 Heart failure, unspecified: Secondary | ICD-10-CM | POA: Insufficient documentation

## 2012-01-09 DIAGNOSIS — Z79899 Other long term (current) drug therapy: Secondary | ICD-10-CM | POA: Insufficient documentation

## 2012-01-09 NOTE — ED Provider Notes (Signed)
History     CSN: [id number]  Arrival date & time 01/09/12  2243   First MD Initiated Contact with Patient 01/09/12 2309      Chief Complaint  Patient presents with  . Constipation    (Consider location/radiation/quality/duration/timing/severity/associated sxs/prior treatment) Patient is a 76 y.o. female presenting with constipation. The history is provided by the patient, the spouse and medical records.  Constipation  Pertinent negatives include no abdominal pain, no diarrhea, no nausea, no vomiting, no chest pain, no headaches, no coughing and no rash.   the patient is a 76 year old, female, who presents to the emergency department complaining of abdominal distention, and no bowel movement since yesterday.  She denies nausea, vomiting, fevers, chills, diarrhea, or urinary tract symptoms.  She also complains of lower extremity swelling in her ankles and feet.  She takes Lasix, but denies missing any of her medications.  She is not taking any narcotics.  Past Medical History  Diagnosis Date  . Coronary artery disease   . Diabetes mellitus   . Hypertension   . CHF (congestive heart failure)   . Blind     Past Surgical History  Procedure Date  . Coronary angioplasty with stent placement     No family history on file.  History  Substance Use Topics  . Smoking status: Former Smoker    Types: Cigarettes    Quit date: 12/21/1978  . Smokeless tobacco: Not on file  . Alcohol Use: No    OB History    Grav Para Term Preterm Abortions TAB SAB Ect Mult Living                  Review of Systems  Constitutional: Negative for chills and diaphoresis.  HENT: Negative for congestion and neck pain.   Eyes: Negative for redness.  Respiratory: Negative for cough, chest tightness and shortness of breath.   Cardiovascular: Positive for leg swelling. Negative for chest pain.  Gastrointestinal: Positive for constipation. Negative for nausea, vomiting, abdominal pain and diarrhea.    Genitourinary: Negative for dysuria.  Musculoskeletal: Negative for back pain.  Skin: Negative for rash.  Neurological: Negative for light-headedness, numbness and headaches.  Psychiatric/Behavioral: Negative for confusion.    Allergies  Lisinopril  Home Medications   Current Outpatient Rx  Name Route Sig Dispense Refill  . AMLODIPINE BESYLATE 10 MG PO TABS Oral Take 10 mg by mouth daily.      . ASPIRIN EC 81 MG PO TBEC Oral Take 81 mg by mouth daily.      Marland Kitchen CARVEDILOL 25 MG PO TABS Oral Take 1 tablet (25 mg total) by mouth 2 (two) times daily. 180 tablet 6  . ESOMEPRAZOLE MAGNESIUM 40 MG PO CPDR Oral Take 1 capsule (40 mg total) by mouth daily. 90 capsule 6  . FUROSEMIDE 20 MG PO TABS Oral Take 20 mg by mouth 2 (two) times daily.      Marland Kitchen GLIPIZIDE 10 MG PO TABS Oral Take 1 tablet (10 mg total) by mouth daily. 30 tablet 11  . HALOPERIDOL 2 MG PO TABS Oral Take 2 mg by mouth 2 (two) times daily.    Marland Kitchen LOSARTAN POTASSIUM 25 MG PO TABS Oral Take 25 mg by mouth daily.     Marland Kitchen METFORMIN HCL 500 MG PO TABS Oral Take 1 tablet (500 mg total) by mouth 2 (two) times daily with a meal. 60 tablet 11  . SIMVASTATIN 20 MG PO TABS Oral Take 1 tablet (20 mg total) by mouth  at bedtime. 30 tablet 6    REFILLED ONCE ON  9/27....ANY ADDITIONAL REFILLS?  Marland Kitchen BRIMONIDINE TARTRATE 0.15 % OP SOLN Both Eyes Place 1 drop into both eyes as directed. 15 mL 0  . DORZOLAMIDE HCL-TIMOLOL MAL 22.3-6.8 MG/ML OP SOLN Both Eyes Place 1 drop into both eyes 2 (two) times daily. 10 mL 1    BP 117/52  Pulse 78  Temp(Src) 98.6 F (37 C) (Oral)  Resp 20  SpO2 96%  Physical Exam  Constitutional: She is oriented to person, place, and time. No distress.       Morbidly obese  HENT:  Head: Normocephalic and atraumatic.  Eyes: Conjunctivae are normal. Pupils are equal, round, and reactive to light.  Neck: Normal range of motion. Neck supple.  Cardiovascular: Normal rate.   No murmur heard. Pulmonary/Chest: Effort normal.  No respiratory distress.  Abdominal: Soft. There is no tenderness. There is no rebound and no guarding.  Musculoskeletal: She exhibits edema. She exhibits no tenderness.  Neurological: She is alert and oriented to person, place, and time.  Skin: Skin is warm and dry.  Psychiatric: She has a normal mood and affect.    ED Course  Procedures (including critical care time) 76 year old, female, complains of constipation.  She hasn't had a bowel movement since yesterday.  She has mild abdominal discomfort, and distention.  We will perform an x-ray of her abdomen and blood chemistry, since she is taking Lasix.  She is not on any narcotics.  There is no indication of an acute abdomen or need for acute intervention.   Labs Reviewed  URINALYSIS, ROUTINE W REFLEX MICROSCOPIC  BASIC METABOLIC PANEL   No results found.   No diagnosis found.    MDM  Constipation        Nicholes Stairs, MD 01/10/12 [phone]

## 2012-01-09 NOTE — ED Notes (Signed)
[id number]<AV> Expected date:01/09/12<BR> Expected time:10:15 PM<BR> Means of arrival:Ambulance<BR> Comments:<BR> EMS 261 GC - abd pain

## 2012-01-10 LAB — URINALYSIS, ROUTINE W REFLEX MICROSCOPIC
Bilirubin Urine: NEGATIVE
Glucose, UA: NEGATIVE mg/dL
Hgb urine dipstick: NEGATIVE
Specific Gravity, Urine: 1.01 (ref 1.005–1.030)
Urobilinogen, UA: 0.2 mg/dL (ref 0.0–1.0)
pH: 5 (ref 5.0–8.0)

## 2012-01-10 LAB — BASIC METABOLIC PANEL
BUN: 25 mg/dL — ABNORMAL HIGH (ref 6–23)
CO2: 26 mEq/L (ref 19–32)
Chloride: 103 mEq/L (ref 96–112)
Glucose, Bld: 175 mg/dL — ABNORMAL HIGH (ref 70–99)
Potassium: 4.4 mEq/L (ref 3.5–5.1)

## 2012-01-10 MED ORDER — PHOSPHATE ENEMA 7-19 GM/118ML RE ENEM: 1.0000 | ENEMA | Freq: Once | RECTAL | Status: AC | PRN

## 2012-01-10 MED ORDER — MAGNESIUM CITRATE PO SOLN: 296.0000 mL | Freq: Once | ORAL | Status: AC

## 2012-01-10 NOTE — ED Notes (Signed)
Pt had a BM be for d/c and state "feels better". Rx given to pt family.

## 2012-01-11 ENCOUNTER — Other Ambulatory Visit: Payer: Self-pay | Admitting: Internal Medicine

## 2012-01-14 ENCOUNTER — Other Ambulatory Visit: Payer: Self-pay | Admitting: *Deleted

## 2012-01-14 DIAGNOSIS — E119 Type 2 diabetes mellitus without complications: Secondary | ICD-10-CM

## 2012-01-14 MED ORDER — FUROSEMIDE 20 MG PO TABS: 20.0000 mg | ORAL_TABLET | Freq: Two times a day (BID) | ORAL | Status: DC

## 2012-01-14 NOTE — Telephone Encounter (Signed)
Was supposed to F/U 11/12. Please sch appt next 90 days with PCP. Pls ask pt to contact optho to refill eye drops.

## 2012-01-19 ENCOUNTER — Encounter (HOSPITAL_COMMUNITY): Payer: Self-pay | Admitting: *Deleted

## 2012-01-19 ENCOUNTER — Emergency Department (HOSPITAL_COMMUNITY)
Admission: EM | Admit: 2012-01-19 | Discharge: 2012-01-19 | Disposition: A | Discharge status: Home / Self Care | Payer: Medicare Other | Attending: Emergency Medicine | Admitting: Emergency Medicine

## 2012-01-19 ENCOUNTER — Other Ambulatory Visit: Payer: Self-pay

## 2012-01-19 DIAGNOSIS — H543 Unqualified visual loss, both eyes: Secondary | ICD-10-CM | POA: Insufficient documentation

## 2012-01-19 DIAGNOSIS — I2510 Atherosclerotic heart disease of native coronary artery without angina pectoris: Secondary | ICD-10-CM | POA: Insufficient documentation

## 2012-01-19 DIAGNOSIS — Z79899 Other long term (current) drug therapy: Secondary | ICD-10-CM | POA: Insufficient documentation

## 2012-01-19 DIAGNOSIS — I509 Heart failure, unspecified: Secondary | ICD-10-CM | POA: Insufficient documentation

## 2012-01-19 DIAGNOSIS — I10 Essential (primary) hypertension: Secondary | ICD-10-CM | POA: Insufficient documentation

## 2012-01-19 DIAGNOSIS — R609 Edema, unspecified: Secondary | ICD-10-CM | POA: Insufficient documentation

## 2012-01-19 DIAGNOSIS — E669 Obesity, unspecified: Secondary | ICD-10-CM | POA: Insufficient documentation

## 2012-01-19 DIAGNOSIS — E119 Type 2 diabetes mellitus without complications: Secondary | ICD-10-CM | POA: Insufficient documentation

## 2012-01-19 DIAGNOSIS — R600 Localized edema: Secondary | ICD-10-CM

## 2012-01-19 DIAGNOSIS — Z7982 Long term (current) use of aspirin: Secondary | ICD-10-CM | POA: Insufficient documentation

## 2012-01-19 LAB — CBC
HCT: 38.7 % (ref 36.0–46.0)
Hemoglobin: 12.8 g/dL (ref 12.0–15.0)
MCH: 30.1 pg (ref 26.0–34.0)
MCHC: 33.1 g/dL (ref 30.0–36.0)
MCV: 91.1 fL (ref 78.0–100.0)
Platelets: 205 10*3/uL (ref 150–400)
RBC: 4.25 MIL/uL (ref 3.87–5.11)
RDW: 12.4 % (ref 11.5–15.5)
WBC: 5.5 10*3/uL (ref 4.0–10.5)

## 2012-01-19 LAB — PRO B NATRIURETIC PEPTIDE: Pro B Natriuretic peptide (BNP): 860 pg/mL — ABNORMAL HIGH (ref 0–125)

## 2012-01-19 LAB — POCT I-STAT, CHEM 8
BUN: 22 mg/dL (ref 6–23)
Calcium, Ion: 1.18 mmol/L (ref 1.12–1.32)
Glucose, Bld: 133 mg/dL — ABNORMAL HIGH (ref 70–99)
TCO2: 26 mmol/L (ref 0–100)

## 2012-01-19 MED ORDER — FUROSEMIDE 20 MG PO TABS
20.0000 mg | ORAL_TABLET | Freq: Once | ORAL | Status: AC
  Administered 2012-01-19: 20 mg via ORAL
  Filled 2012-01-19 (×2): qty 1

## 2012-01-19 NOTE — Discharge Instructions (Signed)
Peripheral Edema You have swelling in your legs (peripheral edema). This swelling is due to excess accumulation of salt and water in your body. Edema may be a sign of heart, kidney or liver disease, or a side effect of a medication. It may also be due to problems in the leg veins. Elevating your legs and using special support stockings may be very helpful, if the cause of the swelling is due to poor venous circulation. Avoid long periods of standing, whatever the cause. Treatment of edema depends on identifying the cause. Chips, pretzels, pickles and other salty foods should be avoided. Restricting salt in your diet is almost always needed. Water pills (diuretics) are often used to remove the excess salt and water from your body via urine. These medicines prevent the kidney from reabsorbing sodium. This increases urine flow. Diuretic treatment may also result in lowering of potassium levels in your body. Potassium supplements may be needed if you have to use diuretics daily. Daily weights can help you keep track of your progress in clearing your edema. You should call your caregiver for follow up care as recommended. SEEK IMMEDIATE MEDICAL CARE IF:   You have increased swelling, pain, redness, or heat in your legs.   You develop shortness of breath, especially when lying down.   You develop chest or abdominal pain, weakness, or fainting.   You have a fever.  Document Released: 01/14/2005 Document Revised: 08/19/2011 Document Reviewed: 12/25/2009 [employer] Patient Information 2012 [employer]. 

## 2012-01-19 NOTE — ED Notes (Signed)
Patient states bilateral feet edema worsening overtime.  Bilateral edema +3 pedal pulses Trace bilaterally.  Patient states edema radiating to bilateral below the knee.  States have been taking her medication regularly.  Airway intact bilateral equal chest rise and fall.  Patient stated legally blind.  Ax4 two family members at bedside.

## 2012-01-19 NOTE — ED Notes (Signed)
Patient reports she has noted swelling in her legs and feet for over 1 week.  She was seen at [facility] and told to continue her home meds.  Patient has not had any relief

## 2012-01-19 NOTE — ED Provider Notes (Signed)
History    76 year old female brought in by family for evaluation of worsening lower extremity edema. This is gradually worsened over the period of the past several weeks. Recent emergency room evaluation for the same. Was referred to her cardiologist. Pt says she actually went to this appointment but had to leave prior to being seen because she "had an accident in the waiting room" and was taken home by family. Has not rescheduled. Patient denies chest pain. No shortness of breath. No fevers or chills. No acute leg pain. Denies history of blood clot. No urinary complaints. Reports compliance with her medications.  CSN: [id number]  Arrival date & time 01/19/12  1329   First MD Initiated Contact with Patient 01/19/12 1726      Chief Complaint  Patient presents with  . Leg Swelling    (Consider location/radiation/quality/duration/timing/severity/associated sxs/prior treatment) HPI  Past Medical History  Diagnosis Date  . Coronary artery disease   . Diabetes mellitus   . Hypertension   . CHF (congestive heart failure)   . Blind     Past Surgical History  Procedure Date  . Coronary angioplasty with stent placement     No family history on file.  History  Substance Use Topics  . Smoking status: Former Smoker    Types: Cigarettes    Quit date: 12/21/1978  . Smokeless tobacco: Not on file  . Alcohol Use: No    OB History    Grav Para Term Preterm Abortions TAB SAB Ect Mult Living                  Review of Systems   Review of symptoms negative unless otherwise noted in HPI.   Allergies  Lisinopril  Home Medications   Current Outpatient Rx  Name Route Sig Dispense Refill  . AMLODIPINE BESYLATE 10 MG PO TABS Oral Take 10 mg by mouth daily.      . ASPIRIN EC 81 MG PO TBEC Oral Take 81 mg by mouth daily.      Marland Kitchen CARVEDILOL 25 MG PO TABS  TAKE 1 TABLET TWICE A DAY 180 tablet 0  . DORZOLAMIDE HCL-TIMOLOL MAL 22.3-6.8 MG/ML OP SOLN Both Eyes Place 1 drop into both  eyes 2 (two) times daily. 10 mL 1  . FUROSEMIDE 20 MG PO TABS Oral Take 40 mg by mouth daily.    Marland Kitchen GLIPIZIDE ER 10 MG PO TB24  TAKE 1 TABLET BY MOUTH EVERY DAY 90 tablet 0  . HALOPERIDOL 2 MG PO TABS Oral Take 2 mg by mouth 2 (two) times daily. For anxiety    . LOSARTAN POTASSIUM 25 MG PO TABS Oral Take 25 mg by mouth daily.     Marland Kitchen METFORMIN HCL 500 MG PO TABS Oral Take 1 tablet (500 mg total) by mouth 2 (two) times daily with a meal. 60 tablet 11  . SIMVASTATIN 20 MG PO TABS Oral Take 1 tablet (20 mg total) by mouth at bedtime. 30 tablet 6    REFILLED ONCE ON  9/27....ANY ADDITIONAL REFILLS?  Marland Kitchen BRIMONIDINE TARTRATE 0.15 % OP SOLN Both Eyes Place 1 drop into both eyes as directed. 15 mL 0  . MAGNESIUM CITRATE PO SOLN Oral Take 296 mLs by mouth once. 300 mL 0    BP 136/80  Pulse 66  Temp(Src) 98.6 F (37 C) (Oral)  Resp 20  SpO2 97%  Physical Exam  Nursing note and vitals reviewed. Constitutional: No distress.       Laying in bed.  No acute distress. Obese.  HENT:  Head: Normocephalic and atraumatic.       Hairpiece.  Eyes: Conjunctivae are normal. Pupils are equal, round, and reactive to light. Right eye exhibits no discharge. Left eye exhibits no discharge.  Neck: Neck supple.  Cardiovascular: Normal rate, regular rhythm and normal heart sounds.  Exam reveals no gallop and no friction rub.   No murmur heard. Pulmonary/Chest: Effort normal and breath sounds normal. No respiratory distress.  Abdominal: Soft. She exhibits no distension. There is no tenderness.  Musculoskeletal: She exhibits edema. She exhibits no tenderness.       Symmetric, pitting lower extremity edema. No calf tenderness. Negative Homans sign.  Neurological: She is alert.  Skin: Skin is warm and dry. She is not diaphoretic.  Psychiatric: She has a normal mood and affect. Her behavior is normal. Thought content normal.    ED Course  Procedures (including critical care time)  Labs Reviewed  PRO B NATRIURETIC  PEPTIDE - Abnormal; Notable for the following:    Pro B Natriuretic peptide (BNP) 860.0 (*)    All other components within normal limits  POCT I-STAT, CHEM 8 - Abnormal; Notable for the following:    Creatinine, Ser 1.30 (*)    Glucose, Bld 133 (*)    All other components within normal limits  CBC   No results found.  EKG:  Rhythm: Sinus rhythm Rate: 64 Axis: Normal axis Intervals: QT prolongation. QTC is 504 ms. ST segments: Slight ST depression in inferior and lateral leads. This is less pronounced than comparison EKG from 01/09/2012.  1. Lower extremity edema       MDM  76 year old female with worsening lower show no edema. Patient with a known ischemic cardiomyopathy, with an EF in the 30s. Patient with recent evaluation for the same but has not followed up with cardiology yet. No respiratory distress on examination. No acute respiratory complaints. Consider other causes of lower extremity edema such as renal, hepatic but feel less likely. No hx of liver dz. Doubt DVT. Creatinine is stable. Will give additional dose of Lasix in emergency room. Will defer further management to outpt.          Raeford Razor, MD 01/19/12 1850

## 2012-01-22 ENCOUNTER — Ambulatory Visit (INDEPENDENT_AMBULATORY_CARE_PROVIDER_SITE_OTHER): Payer: Medicare Other | Admitting: Physician Assistant

## 2012-01-22 ENCOUNTER — Encounter: Payer: Self-pay | Admitting: Physician Assistant

## 2012-01-22 VITALS — BP 128/72 | HR 82 | Resp 18 | Ht 64.0 in | Wt 267.8 lb

## 2012-01-22 DIAGNOSIS — I10 Essential (primary) hypertension: Secondary | ICD-10-CM

## 2012-01-22 DIAGNOSIS — E119 Type 2 diabetes mellitus without complications: Secondary | ICD-10-CM

## 2012-01-22 DIAGNOSIS — I2510 Atherosclerotic heart disease of native coronary artery without angina pectoris: Secondary | ICD-10-CM

## 2012-01-22 DIAGNOSIS — N189 Chronic kidney disease, unspecified: Secondary | ICD-10-CM

## 2012-01-22 DIAGNOSIS — I5022 Chronic systolic (congestive) heart failure: Secondary | ICD-10-CM

## 2012-01-22 DIAGNOSIS — E785 Hyperlipidemia, unspecified: Secondary | ICD-10-CM

## 2012-01-22 NOTE — Progress Notes (Signed)
[address] [place], [place]  [address] Phone: [phone] Fax:  [phone]  Date:  01/22/2012   Name:  [name]       DOB:  08-08-1936 MRN:  [id number]  PCP:  Dr. Dorise Hiss  Primary Cardiologist:  Dr. Charlton Haws  Primary Electrophysiologist:  None    History of Present Illness: [name] is a 76 y.o. female who presents for follow up on edema.  She has a history of nonischemic cardiomyopathy, EF 30-35%, minimal nonobstructive coronary artery disease, hypertension, hyperlipidemia, diabetes, diabetic retinopathy.  Echocardiogram 10/09: EF 30-35%, mild LVH, increased LV filling pressures, mild LAE.  LHC 05/25/06: No renal artery stenosis, proximal LAD 40%, proximal D1 40%.  She was seen in the emergency room 12/9 as well as 1/29 with lower extremity edema.  She was given extra Lasix back in December and asked to followup in the office.  She presented back to the emergency room with increased edema and was given Lasix again.  Labs 01/19/12: Potassium 4.5, BUN 22, creatinine 1.30, BNP 860, hemoglobin 12.9.  Chest x-ray at that time was negative for edema.  She was previously on 20 mg twice a day of Lasix.  Since her trip to the emergency room 1/29, she has been taking 40 mg twice a day.  Her edema is improved.  She describes symptoms of orthopnea(Sleeping in a recliner).  No PND.  No chest pain.  No syncope.  No palpitations.  Since increasing her Lasix, she feels better.  Her swelling is down.  She is breathing better.  She is now sleeping in her bed again.  She describes chronic NYHA class 2B-3 symptoms.  Past Medical History  Diagnosis Date  . Coronary artery disease     LHC 05/25/06: No renal artery stenosis, proximal LAD 40%, proximal D1 40%  . Diabetes mellitus   . Hypertension   . Chronic systolic heart failure     Echocardiogram 10/09: EF 30-35%, mild LVH, increased LV filling pressures, mild LAE  . Diabetic retinopathy   . NICM  (nonischemic cardiomyopathy)     Current Outpatient Prescriptions  Medication Sig Dispense Refill  . ALPRAZolam (XANAX) 0.5 MG tablet Take 0.5 mg by mouth. AS DIRECTED.      Marland Kitchen amLODipine (NORVASC) 10 MG tablet Take 10 mg by mouth daily.        Marland Kitchen aspirin EC 81 MG tablet Take 81 mg by mouth daily.        . carvedilol (COREG) 25 MG tablet TAKE 1 TABLET TWICE A DAY  180 tablet  0  . CRESTOR 10 MG tablet Take 10 mg by mouth daily.       . dorzolamide-timolol (COSOPT) 22.3-6.8 MG/ML ophthalmic solution Place 1 drop into both eyes 2 (two) times daily.  10 mL  1  . furosemide (LASIX) 20 MG tablet Take 40 mg by mouth daily.      Marland Kitchen glipiZIDE (GLUCOTROL XL) 10 MG 24 hr tablet TAKE 1 TABLET BY MOUTH EVERY DAY  90 tablet  0  . losartan (COZAAR) 25 MG tablet Take 25 mg by mouth daily.       . metFORMIN (GLUCOPHAGE) 500 MG tablet Take 1 tablet (500 mg total) by mouth 2 (two) times daily with a meal.  60 tablet  11  . simvastatin (ZOCOR) 20 MG tablet Take 1 tablet (20 mg total) by mouth at bedtime.  30 tablet  6  . brimonidine (ALPHAGAN) 0.15 % ophthalmic solution Place  1 drop into both eyes as directed.  15 mL  0  . haloperidol (HALDOL) 2 MG tablet Take 2 mg by mouth 2 (two) times daily. For anxiety        Allergies: Allergies  Allergen Reactions  . Lisinopril     REACTION: cough    History  Substance Use Topics  . Smoking status: Former Smoker    Types: Cigarettes    Quit date: 12/21/1978  . Smokeless tobacco: Not on file  . Alcohol Use: No     ROS:  Please see the history of present illness.   She has had a recent dry cough.  All other systems reviewed and negative.   PHYSICAL EXAM: VS:  BP 128/72  Pulse 82  Resp 18  Ht 5\' 4"  (1.626 m)  Wt 267 lb 12.8 oz (121.473 kg)  BMI 45.97 kg/m2 Well nourished, well developed, in no acute distress HEENT: normal Neck: no JVD At 90 degrees Cardiac:  normal S1, S2; RRR; no murmur; No S3 Lungs:  clear to auscultation bilaterally, no wheezing,  rhonchi or rales Abd: soft, nontender, no hepatomegaly Ext: Trace-1+ bilateral edema Skin: warm and dry Neuro:  CNs 2-12 intact, no focal abnormalities noted  EKG:  Sinus rhythm, rate 76, normal axis, T wave inversions in one and aVL, QT prolonged (QTc 495),No significant change when compared to prior tracings  ASSESSMENT AND PLAN:

## 2012-01-22 NOTE — Assessment & Plan Note (Signed)
Managed by primary care. 

## 2012-01-22 NOTE — Patient Instructions (Signed)
Your physician recommends that you schedule a follow-up appointment in: 4-6 WEEKS WITH DR. [name]  Your physician has requested that you have an echocardiogram DX 428.22 HEART FAILURE. Echocardiography is a painless test that uses sound waves to create images of your heart. It provides your doctor with information about the size and shape of your heart and how well your heart's chambers and valves are working. This procedure takes approximately one hour. There are no restrictions for this procedure.  PLEASE FOLLOW A LOW SODIUM DIET NO MORE THAN 2 GM DAILY OF SALT.  THIS EVENING 01/22/12 TAKE 40 MG OF LASIX; ON Saturday 01/23/12 TAKE LASIX 40 MG TWICE DAILY; THEN ON Sunday 01/24/12 START LASIX 20 MG TWICE DAILY UNTIL FURTHER NOTICE ; IF YOUR WEIGHT IS INCREASED BY 2-3 LB'S IN 1 DAY OR YOU FEEL MORE SHORT OF BREATH, OR HAVE MORE SWELLING; YOU MAY TAKE AN EXTRA 20 MG LASIX

## 2012-01-22 NOTE — Assessment & Plan Note (Signed)
Recent creatinine stable.  Repeat a basic metabolic panel next week.

## 2012-01-22 NOTE — Assessment & Plan Note (Addendum)
Her volume seems to be improved since her Lasix was increased in the emergency room.  She does admit to some dietary indiscretion with salt.  However, she has not missed any medications.  I recommend she continue 40 mg twice a day of Lasix for one more day.  She will then go back to 20 mg twice a day.  We discussed weighing herself and taking it next her 20 mg of Lasix if she has increased edema, 2-3 pound weight gain or increased shortness of breath.  Check a basic metabolic panel in followup next week.  Obtain an echocardiogram to reassess her LV function.  Followup with Dr. Charlton Haws in 4-6 weeks.

## 2012-01-22 NOTE — Assessment & Plan Note (Signed)
Minimal, nonobstructive by cardiac catheterization in the past.  She is not having angina.  Continue aspirin.

## 2012-01-22 NOTE — Assessment & Plan Note (Signed)
Follow-up with primary care

## 2012-01-22 NOTE — Assessment & Plan Note (Signed)
Controlled.  

## 2012-02-03 ENCOUNTER — Other Ambulatory Visit: Payer: Self-pay

## 2012-02-03 ENCOUNTER — Other Ambulatory Visit (INDEPENDENT_AMBULATORY_CARE_PROVIDER_SITE_OTHER): Payer: Medicare Other | Admitting: *Deleted

## 2012-02-03 ENCOUNTER — Ambulatory Visit (HOSPITAL_COMMUNITY): Payer: Medicare Other | Attending: Cardiovascular Disease | Admitting: Radiology

## 2012-02-03 DIAGNOSIS — I428 Other cardiomyopathies: Secondary | ICD-10-CM | POA: Insufficient documentation

## 2012-02-03 DIAGNOSIS — R609 Edema, unspecified: Secondary | ICD-10-CM | POA: Insufficient documentation

## 2012-02-03 DIAGNOSIS — I509 Heart failure, unspecified: Secondary | ICD-10-CM | POA: Insufficient documentation

## 2012-02-03 DIAGNOSIS — I10 Essential (primary) hypertension: Secondary | ICD-10-CM | POA: Insufficient documentation

## 2012-02-03 DIAGNOSIS — I5022 Chronic systolic (congestive) heart failure: Secondary | ICD-10-CM

## 2012-02-03 DIAGNOSIS — I2510 Atherosclerotic heart disease of native coronary artery without angina pectoris: Secondary | ICD-10-CM | POA: Insufficient documentation

## 2012-02-03 DIAGNOSIS — E785 Hyperlipidemia, unspecified: Secondary | ICD-10-CM | POA: Insufficient documentation

## 2012-02-03 DIAGNOSIS — E119 Type 2 diabetes mellitus without complications: Secondary | ICD-10-CM | POA: Insufficient documentation

## 2012-02-03 LAB — BASIC METABOLIC PANEL
Calcium: 8.9 mg/dL (ref 8.4–10.5)
Creatinine, Ser: 1.3 mg/dL — ABNORMAL HIGH (ref 0.4–1.2)
GFR: 52.54 mL/min — ABNORMAL LOW (ref >60.00)
Glucose, Bld: 138 mg/dL — ABNORMAL HIGH (ref 70–99)
Sodium: 140 mEq/L (ref 135–145)

## 2012-02-04 ENCOUNTER — Telehealth: Payer: Self-pay | Admitting: Cardiovascular Disease

## 2012-02-04 NOTE — Telephone Encounter (Signed)
Walk in pt Form " Pt Needs Phone Call about Fluid Pills" please call @ [phone] 02/04/12 ( i just received this @ 5pm on 02/03/12) KM Sent to Message Nurse

## 2012-02-05 ENCOUNTER — Telehealth: Payer: Self-pay | Admitting: *Deleted

## 2012-02-05 NOTE — Telephone Encounter (Signed)
Dr nishan pt 

## 2012-02-05 NOTE — Telephone Encounter (Signed)
Attempted to call pt to follow up on the walk-in form that was filled out. No answer at number left by pt.

## 2012-02-08 ENCOUNTER — Telehealth: Payer: Self-pay | Admitting: *Deleted

## 2012-02-08 NOTE — Telephone Encounter (Signed)
02/08/12----called pt to dicuss reason for walk -in--pt states wants results ECHO--results given--also states her furosimide was increased to 2 20mg  tabs in am and 2 20mg  tabs in pm--pt has decreased to 20mg  tab in am and 20mg  tab in pm, and wants to know if this is OK--advised i would have to pass this message along to dr [name] and when he responded we would call her--pt agrees--nt

## 2012-02-08 NOTE — Telephone Encounter (Signed)
See Scotts note.  Ok to go back to lasix 20 bid but she needs to take extra if her weight is up or she has more edema.  Needs to work on diet more.  EF improved on echo

## 2012-02-08 NOTE — Telephone Encounter (Signed)
02/08/12--called ms Roa and advised she may cut back on lasix to BID --if she develops swelling in lower extremeties she must go up on lasix again--i told her her ECHO showed some improvement and she was happy about that--nt

## 2012-02-26 ENCOUNTER — Emergency Department (HOSPITAL_COMMUNITY): Payer: Medicare Other

## 2012-02-26 ENCOUNTER — Other Ambulatory Visit: Payer: Self-pay

## 2012-02-26 ENCOUNTER — Encounter (HOSPITAL_COMMUNITY): Payer: Self-pay | Admitting: *Deleted

## 2012-02-26 ENCOUNTER — Inpatient Hospital Stay (HOSPITAL_COMMUNITY)
Admission: EM | Admit: 2012-02-26 | Discharge: 2012-02-29 | DRG: 292 | Disposition: A | Discharge status: Home Health | Payer: Medicare Other | Attending: Internal Medicine | Admitting: Internal Medicine

## 2012-02-26 ENCOUNTER — Encounter: Payer: Medicare Other | Admitting: Internal Medicine

## 2012-02-26 DIAGNOSIS — I2510 Atherosclerotic heart disease of native coronary artery without angina pectoris: Secondary | ICD-10-CM | POA: Diagnosis present

## 2012-02-26 DIAGNOSIS — E1139 Type 2 diabetes mellitus with other diabetic ophthalmic complication: Secondary | ICD-10-CM | POA: Diagnosis present

## 2012-02-26 DIAGNOSIS — E113599 Type 2 diabetes mellitus with proliferative diabetic retinopathy without macular edema, unspecified eye: Secondary | ICD-10-CM | POA: Diagnosis present

## 2012-02-26 DIAGNOSIS — Z9861 Coronary angioplasty status: Secondary | ICD-10-CM

## 2012-02-26 DIAGNOSIS — J40 Bronchitis, not specified as acute or chronic: Secondary | ICD-10-CM | POA: Diagnosis present

## 2012-02-26 DIAGNOSIS — I509 Heart failure, unspecified: Secondary | ICD-10-CM | POA: Diagnosis present

## 2012-02-26 DIAGNOSIS — K219 Gastro-esophageal reflux disease without esophagitis: Secondary | ICD-10-CM | POA: Diagnosis present

## 2012-02-26 DIAGNOSIS — Z888 Allergy status to other drugs, medicaments and biological substances status: Secondary | ICD-10-CM

## 2012-02-26 DIAGNOSIS — H5440 Blindness, one eye, unspecified eye: Secondary | ICD-10-CM | POA: Diagnosis present

## 2012-02-26 DIAGNOSIS — E11359 Type 2 diabetes mellitus with proliferative diabetic retinopathy without macular edema: Secondary | ICD-10-CM | POA: Diagnosis present

## 2012-02-26 DIAGNOSIS — D649 Anemia, unspecified: Secondary | ICD-10-CM | POA: Diagnosis present

## 2012-02-26 DIAGNOSIS — I5032 Chronic diastolic (congestive) heart failure: Secondary | ICD-10-CM | POA: Diagnosis present

## 2012-02-26 DIAGNOSIS — J180 Bronchopneumonia, unspecified organism: Secondary | ICD-10-CM

## 2012-02-26 DIAGNOSIS — I428 Other cardiomyopathies: Secondary | ICD-10-CM | POA: Diagnosis present

## 2012-02-26 DIAGNOSIS — I5023 Acute on chronic systolic (congestive) heart failure: Principal | ICD-10-CM | POA: Diagnosis present

## 2012-02-26 DIAGNOSIS — I129 Hypertensive chronic kidney disease with stage 1 through stage 4 chronic kidney disease, or unspecified chronic kidney disease: Secondary | ICD-10-CM | POA: Diagnosis present

## 2012-02-26 DIAGNOSIS — Z87891 Personal history of nicotine dependence: Secondary | ICD-10-CM

## 2012-02-26 DIAGNOSIS — I10 Essential (primary) hypertension: Secondary | ICD-10-CM | POA: Diagnosis present

## 2012-02-26 DIAGNOSIS — N183 Chronic kidney disease, stage 3 unspecified: Secondary | ICD-10-CM | POA: Diagnosis present

## 2012-02-26 DIAGNOSIS — N189 Chronic kidney disease, unspecified: Secondary | ICD-10-CM | POA: Diagnosis present

## 2012-02-26 DIAGNOSIS — J690 Pneumonitis due to inhalation of food and vomit: Secondary | ICD-10-CM

## 2012-02-26 DIAGNOSIS — E785 Hyperlipidemia, unspecified: Secondary | ICD-10-CM | POA: Diagnosis present

## 2012-02-26 HISTORY — DX: Encounter for other specified aftercare: Z51.89

## 2012-02-26 HISTORY — DX: Unspecified osteoarthritis, unspecified site: M19.90

## 2012-02-26 HISTORY — DX: Anxiety disorder, unspecified: F41.9

## 2012-02-26 LAB — BASIC METABOLIC PANEL
Calcium: 8.7 mg/dL (ref 8.4–10.5)
Creatinine, Ser: 1.14 mg/dL — ABNORMAL HIGH (ref 0.50–1.10)
GFR calc Af Amer: 54 mL/min — ABNORMAL LOW (ref >90)
GFR calc non Af Amer: 47 mL/min — ABNORMAL LOW (ref >90)
Sodium: 142 mEq/L (ref 135–145)

## 2012-02-26 LAB — POCT I-STAT TROPONIN I: Troponin i, poc: 0 ng/mL (ref 0.00–0.08)

## 2012-02-26 LAB — CBC
Hemoglobin: 10.4 g/dL — ABNORMAL LOW (ref 12.0–15.0)
MCHC: 32.4 g/dL (ref 30.0–36.0)
Platelets: 236 10*3/uL (ref 150–400)
RDW: 13.5 % (ref 11.5–15.5)

## 2012-02-26 LAB — PRO B NATRIURETIC PEPTIDE: Pro B Natriuretic peptide (BNP): 1041 pg/mL — ABNORMAL HIGH (ref 0–125)

## 2012-02-26 MED ORDER — SODIUM CHLORIDE 0.9 % IV BOLUS (SEPSIS): 1000.0000 mL | Freq: Once | INTRAVENOUS | Status: DC

## 2012-02-26 MED ORDER — PIPERACILLIN-TAZOBACTAM 3.375 G IVPB
3.3750 g | Freq: Once | INTRAVENOUS | Status: AC
  Administered 2012-02-26: 3.375 g via INTRAVENOUS
  Filled 2012-02-26: qty 50

## 2012-02-26 MED ORDER — FUROSEMIDE 10 MG/ML IJ SOLN
20.0000 mg | Freq: Once | INTRAMUSCULAR | Status: AC
  Administered 2012-02-26: 20 mg via INTRAVENOUS
  Filled 2012-02-26: qty 2

## 2012-02-26 MED ORDER — VANCOMYCIN HCL IN DEXTROSE 1-5 GM/200ML-% IV SOLN
1000.0000 mg | Freq: Once | INTRAVENOUS | Status: AC
  Administered 2012-02-26: 1000 mg via INTRAVENOUS
  Filled 2012-02-26: qty 200

## 2012-02-26 MED ORDER — SODIUM CHLORIDE 0.9 % IV BOLUS (SEPSIS)
250.0000 mL | Freq: Once | INTRAVENOUS | Status: AC
  Administered 2012-02-26: 250 mL via INTRAVENOUS

## 2012-02-26 NOTE — ED Notes (Signed)
Medical screening examination/treatment/procedure(s) were conducted as a shared visit with non-physician practitioner(s) and myself.  I personally evaluated the patient during the encounter.  Patient presents with cough x1 month.  She does note that she has some sputum production with it.  Afebrile here at this time.  Patient also notes a history of CHF with bilateral lower extremity edema that appears to be at her baseline.  Her chest x-ray demonstrates possible effusions and edema versus pneumonia.  Given the patient's symptoms with cough we will cover her for community-acquired pneumonia with azithromycin and ceftriaxone.  We'll also give the patient a dose of Lasix as this may more definitively be a CHF process or a dual infectious and CHF process at this time.  Her BNP is also elevated at this time patient does do see the outpatient clinics and we will call them for admission of this patient for further observation overnight.    Nat Christen, MD 02/26/12 2100

## 2012-02-26 NOTE — ED Notes (Signed)
IV attempt failed, IV team paged.

## 2012-02-26 NOTE — ED Provider Notes (Signed)
Patient seen in triage for her medical screening purposes. She states that she has had shortness of breath and cough over the last several weeks. She also states she has had nasal congestion and cold symptoms over the past month. She states her shortness of breath is worse when lying flat. She has some lower extremity edema and over the past several weeks and had Lasix dosage increased to 40 mg twice daily and has since been decreased back down to 20 mg twice daily. Chest x-ray shows possible pneumonia with slight effusions and interstitial prominence. I've ordered an EKG and lab work to further assist in her management. She will be moved to the main emergency department for further evaluation.  Ethelda Chick, MD 02/26/12 [phone]

## 2012-02-26 NOTE — H&P (Signed)
Date: 02/27/2012               Patient Name:  [name] MRN: [id number]  DOB: 10/22/1936 Age / Sex: 76 y.o., female   PCP: Genella Mech, MD, MD              Medical Service: Internal Medicine Teaching Service              Attending Physician: Dr. Ulyess Mort    First Contact: Dr. Milbert Coulter Pager: [phone]  Second Contact: Dr. Dorthula Rue Pager: [phone]            After Hours (After 5pm  First Contact Pager: [phone]  / weekends / holidays): Second Contact Pager: [phone]    Chief Complaint: cough  History of Present Illness: Patient is a 76 y.o., female with PMHX of nonobstructive CAD, chronic systolic heart failure with last echo 50-55% and diabetes mellitus, type 2, uncontrolled, who presents to [facility]  for evaluation of 1 month history of minimally productive progressively worsening cough with mucous production.  Admits to associated chills, burping, orthopnea (which is worse over last 1 day - typically using 2-3 pillows, but recently requiring to sit upright, PND (having to sleep upright) Denies associated chest pain and night sweats. Has had exposure to sick contact with similar symptoms (nephew has been sick with a cough).    Of note, the patient was seen in ED on 1/29 for increased edema, told to increase lasix to 40mg  bid.  She followed up with her cardiologist (Dr. [name]), who felt the edema was improving and that she could go back down to 20mg  bid.  She was told to increase the lasix back up if she had worsening edema or SOB, but she has not done so. Pt does have a scale at home, but has not yet set it up.  Pt is not on an ARB - previously had ACE-I cough. is not a Current smoker. does not have a history of asthma/ COPD. does have a history of acid reflux - not currently on therapy.  Her oldest son helps with medicine at her home.  Denies CP, abdominal pain, dysuria, HA.  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  Past Medical History: Past Medical History    Diagnosis Date  . Coronary artery disease     LHC 05/25/06: No renal artery stenosis, proximal LAD 40%, proximal D1 40%  . Diabetes mellitus     a1c 7.3 in 10/2011  . Hypertension   . Chronic systolic heart failure     Echocardiogram 10/09: EF 30-35%, mild LVH, increased LV filling pressures, mild LAE  . Diabetic retinopathy   . NICM (nonischemic cardiomyopathy)   . Blood transfusion without reported diagnosis   . Anxiety   . Arthritis    Past Surgical History  Procedure Date  . Coronary angioplasty with stent placement   . Eye surgery 2012    both eyes    Meds: Prior to Admission medications   Medication Sig Start Date End Date Taking? Authorizing Provider  ALPRAZolam Prudy Feeler) 0.5 MG tablet Take 0.5 mg by mouth at bedtime as needed. For sleep or anxiety 01/14/12  Yes Historical Provider, MD  amLODipine (NORVASC) 10 MG tablet Take 10 mg by mouth daily.     Yes Historical Provider, MD  aspirin EC 81 MG tablet Take 81 mg by mouth daily.     Yes Historical Provider, MD  carvedilol (COREG) 25 MG tablet Take 25 mg by  mouth 2 (two) times daily with a meal.   Yes Historical Provider, MD  CRESTOR 10 MG tablet Take 10 mg by mouth daily.  01/14/12  Yes Historical Provider, MD  furosemide (LASIX) 20 MG tablet Take 20 mg by mouth 2 (two) times daily.  01/14/12  Yes Burns Spain, MD  glipiZIDE (GLUCOTROL XL) 10 MG 24 hr tablet Take 10 mg by mouth daily.   Yes Historical Provider, MD  losartan (COZAAR) 25 MG tablet Take 25 mg by mouth daily.  04/14/11  Yes Historical Provider, MD  metFORMIN (GLUCOPHAGE) 500 MG tablet Take 500 mg by mouth 2 (two) times daily with a meal.   Yes Historical Provider, MD  simvastatin (ZOCOR) 20 MG tablet Take 1 tablet (20 mg total) by mouth at bedtime. 11/19/11  Yes Manuela Schwartz, MD  brimonidine (ALPHAGAN) 0.15 % ophthalmic solution Place 1 drop into both eyes as directed. 02/26/11 05/27/11  Edsel Petrin, DO   Allergies: Lisinopril  Family  Hx: History reviewed. No pertinent family history.  Social Hx: History   Social History  . Marital Status: Widowed    Spouse Name: N/A    Number of Children: 5  . Years of Education: RN school   Occupational History  . Retired     formerly worked as a Engineer, civil (consulting).   Social History Main Topics  . Smoking status: Former Smoker    Types: Cigarettes    Quit date: 12/21/1978  . Smokeless tobacco: Not on file   Comment: does have second hand smoke exposure  . Alcohol Use: No  . Drug Use: No  . Sexually Active: Not on file   Other Topics Concern  . Not on file   Social History Narrative   Lives with son who helps with her medications.    Review of Systems: As per HPI  Physical Exam: 98.1    149/80   89    20    88-95%/RA GEN: No apparent distress.  Alert and oriented x 3.  Pleasant, conversant, and cooperative to exam. HEENT: head is autraumatic and normocephalic.  Neck is supple without palpable masses or lymphadenopathy.  No JVD appreciated.  Vision substantially diminished.  Unable to test EOM.  Pupils nonreactive, unable to apprecaite pupil on R.  Sclerae anicteric.  Conjunctivae with injection. Mucous membranes are moist.  Oropharynx is without erythema, exudates, or other abnormal lesions. RESP:  Lungs are clear to ascultation bilaterally with good air movement.  No wheezes, ronchi, or rubs. CARDIOVASCULAR: regular rate, normal rhythm.  Clear S1, S2, 2/6 HSM @ USB ABDOMEN: obese, non-tender, non-distended.  Bowels sounds present in all quadrants and normoactive.  No palpable masses. EXT: warm and dry.  No clubbing or cyanosis.  2+ pitting edema to mid-calf b/l. SKIN: warm and dry with normal turgor.  No rashes or abnormal lesions observed. NEURO: communicating freely, strength preserved in b/l UE/LE  Lab results: Basic Metabolic Panel:  [facility] 02/26/12 1931  NA 142  K 4.1  CL 104  CO2 30  GLUCOSE 99  BUN 17  CREATININE 1.14*  CALCIUM 8.7  MG --  PHOS --    CBC:  Basename 02/26/12 1931  WBC 7.3  NEUTROABS --  HGB 10.4*  HCT 32.1*  MCV 92.5  PLT 236   Cardiac Enzymes: Troponin [facility] Test)  [facility] 02/26/12 1947  TROPIPOC 0.00   BNP:  Basename 02/26/12 1931  PROBNP 1041.0*   D-Dimer: No results found for this basename: DDIMER:2 in  the last 72 hours CBG:  Basename 02/27/12 0015  GLUCAP 112*    Imaging results:  Dg Chest 2 View  02/26/2012  *RADIOLOGY REPORT*  Clinical Data: Cough, chills and high blood pressure.  History of diabetes.  CHEST - 2 VIEW  Comparison: Chest x-ray 11/29/2011.  Findings: Lung volumes are normal.  No consolidative airspace disease.  Lateral view demonstrates minimal blunting of the costophrenic sulci bilaterally, suggestive of trace bilateral pleural effusions.  Prominent interstitial markings in the lung bases bilaterally.  Pulmonary vasculature is normal.  Heart size is mildly enlarged (unchanged).  Mediastinal contours are unremarkable.  IMPRESSION: 1.  Mild prominence of interstitial markings throughout the lung bases bilaterally.  In addition, there is evidence to suggest trace bilateral pleural effusions.  Findings could suggest sequelae of recent aspiration, or could be indicative of early bronchopneumonia.  Original Report Authenticated By: Florencia Reasons, M.D.    Other results: EKG: NSR @ 79, no STE, TWI in V4  Assessment & Plan by Problem: # Acute on Chronic systolic heart failure Pt presents with increased DOE, PND, and orthopnea, 8 pouind weight gain as well as an elevated proBNP.  Pitting edema on exam but no crackles in lungs.  Recent echo (50-55%) was actually improved over prior EF of 30-35%, but pt was seen in ED at end of January for similar complaints with only a brief increase in lasix dose.  Has a home scale but it is not set up.  Likely has a mild acute CHF exacerbation, so will diurese with PO lasix and con't to assess. - lasix 40mg  PO bid - ins/outs - daily  weights - bmet in a.m. - con't coreg 25mg  bid  # Cough Subacute in duration (1 month), non productive.  No fever or leukocytosis, and while CXR is suspicious for possible PNA, post URI or bronchitic cough remains more likely.  Could also be 2/2 GERD, as pt is now off nexium and endorses burping up when she eats. - trial of PPI - consider repeat CXR - tessalon pearls  # Anemia Hb of 10.4 below baseline around 12.  No h/o blood loss, MCV is 92. - FOBT-->negative x 1 - CBC in am - anemia panel  # DM-II Last a1c of 7.3 in November, on oral meds only.  Has proliferative retinopathy with near total loss of vision in both eyes.  Has seen ophthalmology. - con't glipizide 10mg  daily - con't metformin 500mg  bid - con't alphagan eye drops  # Hyperlipidemia - con't simvastatin 20mg  daily  # HTN - con't losartan 25mg  daily - con't amlodipine 10mg  daily  # CKD Cr stable at 1.14 today, GFR of 54. - con't to monitor  # Ppx - heparin

## 2012-02-26 NOTE — ED Provider Notes (Signed)
History     CSN: [id number]  Arrival date & time 02/26/12  1643   First MD Initiated Contact with Patient 02/26/12 1825      Chief Complaint  Patient presents with  . Chills    (Consider location/radiation/quality/duration/timing/severity/associated sxs/prior treatment) HPI  Pt presents to the ED from Urgent Care for symptoms of cough for 1 month. The patient states the Urgent Care said that they were closing/or she was late for her appointment, either way, they said if she wanted to be seen she needed to come to the ED. She has been coughing for 1 month, also having problems with tingling in her hands and feeling tired, also pt is having shortness of breath. The patient denies noticing recent weight gain.   Past Medical History  Diagnosis Date  . Coronary artery disease     LHC 05/25/06: No renal artery stenosis, proximal LAD 40%, proximal D1 40%  . Diabetes mellitus   . Hypertension   . Chronic systolic heart failure     Echocardiogram 10/09: EF 30-35%, mild LVH, increased LV filling pressures, mild LAE  . Diabetic retinopathy   . NICM (nonischemic cardiomyopathy)     Past Surgical History  Procedure Date  . Coronary angioplasty with stent placement     History reviewed. No pertinent family history.  History  Substance Use Topics  . Smoking status: Former Smoker    Types: Cigarettes    Quit date: 12/21/1978  . Smokeless tobacco: Not on file  . Alcohol Use: No    OB History    Grav Para Term Preterm Abortions TAB SAB Ect Mult Living                  Review of Systems  All other systems reviewed and are negative.    Allergies  Lisinopril  Home Medications   Current Outpatient Rx  Name Route Sig Dispense Refill  . ALPRAZOLAM 0.5 MG PO TABS Oral Take 0.5 mg by mouth at bedtime as needed. For sleep or anxiety    . AMLODIPINE BESYLATE 10 MG PO TABS Oral Take 10 mg by mouth daily.      . ASPIRIN EC 81 MG PO TBEC Oral Take 81 mg by mouth daily.      Marland Kitchen  CARVEDILOL 25 MG PO TABS Oral Take 25 mg by mouth 2 (two) times daily with a meal.    . CRESTOR 10 MG PO TABS Oral Take 10 mg by mouth daily.     . FUROSEMIDE 20 MG PO TABS Oral Take 20 mg by mouth 2 (two) times daily.     Marland Kitchen GLIPIZIDE ER 10 MG PO TB24 Oral Take 10 mg by mouth daily.    Marland Kitchen LOSARTAN POTASSIUM 25 MG PO TABS Oral Take 25 mg by mouth daily.     Marland Kitchen METFORMIN HCL 500 MG PO TABS Oral Take 500 mg by mouth 2 (two) times daily with a meal.    . SIMVASTATIN 20 MG PO TABS Oral Take 1 tablet (20 mg total) by mouth at bedtime. 30 tablet 6    REFILLED ONCE ON  9/27....ANY ADDITIONAL REFILLS?  Marland Kitchen BRIMONIDINE TARTRATE 0.15 % OP SOLN Both Eyes Place 1 drop into both eyes as directed. 15 mL 0    BP 114/71  Temp(Src) 98.7 F (37.1 C) (Oral)  Resp 22  SpO2 100%  Physical Exam  Nursing note and vitals reviewed. Constitutional: She appears well-developed and well-nourished. No distress.       obese  HENT:  Head: Normocephalic and atraumatic.  Eyes: Pupils are equal, round, and reactive to light.  Neck: Normal range of motion. Neck supple.  Cardiovascular: Normal rate and regular rhythm.   Pulmonary/Chest: Effort normal. She has wheezes. She has rales.  Abdominal: Soft.  Neurological: She is alert.  Skin: Skin is warm. She is diaphoretic.    ED Course  Procedures (including critical care time)  Labs Reviewed  CBC - Abnormal; Notable for the following:    RBC 3.47 (*)    Hemoglobin 10.4 (*)    HCT 32.1 (*)    All other components within normal limits  PRO B NATRIURETIC PEPTIDE - Abnormal; Notable for the following:    Pro B Natriuretic peptide (BNP) 1041.0 (*)    All other components within normal limits  BASIC METABOLIC PANEL - Abnormal; Notable for the following:    Creatinine, Ser 1.14 (*)    GFR calc non Af Amer 47 (*)    GFR calc Af Amer 54 (*)    All other components within normal limits  POCT I-STAT TROPONIN I   Dg Chest 2 View  02/26/2012  *RADIOLOGY REPORT*  Clinical  Data: Cough, chills and high blood pressure.  History of diabetes.  CHEST - 2 VIEW  Comparison: Chest x-ray 11/29/2011.  Findings: Lung volumes are normal.  No consolidative airspace disease.  Lateral view demonstrates minimal blunting of the costophrenic sulci bilaterally, suggestive of trace bilateral pleural effusions.  Prominent interstitial markings in the lung bases bilaterally.  Pulmonary vasculature is normal.  Heart size is mildly enlarged (unchanged).  Mediastinal contours are unremarkable.  IMPRESSION: 1.  Mild prominence of interstitial markings throughout the lung bases bilaterally.  In addition, there is evidence to suggest trace bilateral pleural effusions.  Findings could suggest sequelae of recent aspiration, or could be indicative of early bronchopneumonia.  Original Report Authenticated By: Florencia Reasons, M.D.     1. CHF exacerbation   2. Bronchopneumonia       MDM   Date: 02/26/2012  Rate: 79  Rhythm: normal sinus rhythm  QRS Axis: normal  Intervals: normal  ST/T Wave abnormalities: normal  Conduction Disutrbances:none  Narrative Interpretation:   Old EKG Reviewed: unchanged from Jan 19, 2012  I have discussed the patient with my supervising attending who has spoken with and evaluated patient as well. The patient is aware of my work-up and plan to admit for CHF exacerbation. Pt given 20 IV lasix in ED and also given Vanc/Zosyn abx for possible pneumo.   Dr. Rosaland Lao to admit, she will do admit orders.     Dorthula Matas, PA 02/26/12 2117

## 2012-02-26 NOTE — ED Notes (Signed)
She has had a cold for one month.  Cold coughing and she cannot  Sleep at night.sometimes she has a productive cough

## 2012-02-26 NOTE — ED Notes (Signed)
Dr. Debera Lat given old and new EKG.  Extra copy placed in patient chart

## 2012-02-27 ENCOUNTER — Encounter (HOSPITAL_COMMUNITY): Payer: Self-pay | Admitting: *Deleted

## 2012-02-27 LAB — IRON AND TIBC
Saturation Ratios: 15 % — ABNORMAL LOW (ref 20–55)
TIBC: 213 ug/dL — ABNORMAL LOW (ref 250–470)
UIBC: 182 ug/dL (ref 125–400)

## 2012-02-27 LAB — CBC
HCT: 33.4 % — ABNORMAL LOW (ref 36.0–46.0)
Hemoglobin: 10.8 g/dL — ABNORMAL LOW (ref 12.0–15.0)
MCV: 92.3 fL (ref 78.0–100.0)
RBC: 3.62 MIL/uL — ABNORMAL LOW (ref 3.87–5.11)
RDW: 13.6 % (ref 11.5–15.5)
WBC: 6.9 10*3/uL (ref 4.0–10.5)

## 2012-02-27 LAB — BASIC METABOLIC PANEL
BUN: 17 mg/dL (ref 6–23)
CO2: 31 mEq/L (ref 19–32)
Chloride: 101 mEq/L (ref 96–112)
Creatinine, Ser: 1.24 mg/dL — ABNORMAL HIGH (ref 0.50–1.10)
Glucose, Bld: 127 mg/dL — ABNORMAL HIGH (ref 70–99)

## 2012-02-27 LAB — FERRITIN: Ferritin: 107 ng/mL (ref 10–291)

## 2012-02-27 LAB — RETICULOCYTES: Retic Ct Pct: 2.2 % (ref 0.4–3.1)

## 2012-02-27 LAB — GLUCOSE, CAPILLARY: Glucose-Capillary: 125 mg/dL — ABNORMAL HIGH (ref 70–99)

## 2012-02-27 MED ORDER — SENNOSIDES-DOCUSATE SODIUM 8.6-50 MG PO TABS
1.0000 | ORAL_TABLET | Freq: Once | ORAL | Status: AC
  Administered 2012-02-27: 1 via ORAL
  Filled 2012-02-27: qty 1

## 2012-02-27 MED ORDER — GLIPIZIDE ER 10 MG PO TB24: 10.0000 mg | ORAL_TABLET | Freq: Every day | ORAL | Status: DC

## 2012-02-27 MED ORDER — GLIPIZIDE ER 10 MG PO TB24
10.0000 mg | ORAL_TABLET | Freq: Every day | ORAL | Status: DC
  Administered 2012-02-27 – 2012-02-29 (×3): 10 mg via ORAL
  Filled 2012-02-27 (×4): qty 1

## 2012-02-27 MED ORDER — AMLODIPINE BESYLATE 10 MG PO TABS
10.0000 mg | ORAL_TABLET | Freq: Every day | ORAL | Status: DC
  Administered 2012-02-27 – 2012-02-29 (×3): 10 mg via ORAL
  Filled 2012-02-27 (×3): qty 1

## 2012-02-27 MED ORDER — FUROSEMIDE 40 MG PO TABS
40.0000 mg | ORAL_TABLET | Freq: Two times a day (BID) | ORAL | Status: DC
  Filled 2012-02-27 (×3): qty 1

## 2012-02-27 MED ORDER — FUROSEMIDE 40 MG PO TABS
60.0000 mg | ORAL_TABLET | Freq: Two times a day (BID) | ORAL | Status: DC
  Administered 2012-02-27: 60 mg via ORAL
  Filled 2012-02-27 (×3): qty 1

## 2012-02-27 MED ORDER — LOSARTAN POTASSIUM 25 MG PO TABS
25.0000 mg | ORAL_TABLET | Freq: Every day | ORAL | Status: DC
  Administered 2012-02-27 – 2012-02-29 (×3): 25 mg via ORAL
  Filled 2012-02-27 (×3): qty 1

## 2012-02-27 MED ORDER — INSULIN ASPART 100 UNIT/ML ~~LOC~~ SOLN
0.0000 [IU] | Freq: Three times a day (TID) | SUBCUTANEOUS | Status: DC
  Administered 2012-02-27: 1 [IU] via SUBCUTANEOUS
  Filled 2012-02-27: qty 3

## 2012-02-27 MED ORDER — SIMVASTATIN 20 MG PO TABS
20.0000 mg | ORAL_TABLET | Freq: Every day | ORAL | Status: DC
  Filled 2012-02-27: qty 1

## 2012-02-27 MED ORDER — ALPRAZOLAM 0.5 MG PO TABS: 0.5000 mg | ORAL_TABLET | Freq: Every evening | ORAL | Status: DC | PRN

## 2012-02-27 MED ORDER — CARVEDILOL 25 MG PO TABS
25.0000 mg | ORAL_TABLET | Freq: Two times a day (BID) | ORAL | Status: DC
  Administered 2012-02-27 – 2012-02-29 (×5): 25 mg via ORAL
  Filled 2012-02-27 (×7): qty 1

## 2012-02-27 MED ORDER — BENZONATATE 100 MG PO CAPS
100.0000 mg | ORAL_CAPSULE | Freq: Three times a day (TID) | ORAL | Status: DC | PRN
  Administered 2012-02-27 (×2): 100 mg via ORAL
  Filled 2012-02-27 (×3): qty 1

## 2012-02-27 MED ORDER — ASPIRIN EC 81 MG PO TBEC
81.0000 mg | DELAYED_RELEASE_TABLET | Freq: Every day | ORAL | Status: DC
  Administered 2012-02-27 – 2012-02-29 (×3): 81 mg via ORAL
  Filled 2012-02-27 (×3): qty 1

## 2012-02-27 MED ORDER — METFORMIN HCL 500 MG PO TABS
500.0000 mg | ORAL_TABLET | Freq: Two times a day (BID) | ORAL | Status: DC
  Administered 2012-02-27 – 2012-02-29 (×5): 500 mg via ORAL
  Filled 2012-02-27 (×7): qty 1

## 2012-02-27 MED ORDER — BRIMONIDINE TARTRATE 0.15 % OP SOLN: 1.0000 [drp] | OPHTHALMIC | Status: DC

## 2012-02-27 MED ORDER — ONDANSETRON HCL 4 MG PO TABS: 4.0000 mg | ORAL_TABLET | Freq: Four times a day (QID) | ORAL | Status: DC | PRN

## 2012-02-27 MED ORDER — HEPARIN SODIUM (PORCINE) 5000 UNIT/ML IJ SOLN
5000.0000 [IU] | Freq: Three times a day (TID) | INTRAMUSCULAR | Status: DC
  Administered 2012-02-27 – 2012-02-29 (×8): 5000 [IU] via SUBCUTANEOUS
  Filled 2012-02-27 (×12): qty 1

## 2012-02-27 MED ORDER — FUROSEMIDE 10 MG/ML IJ SOLN
40.0000 mg | Freq: Two times a day (BID) | INTRAMUSCULAR | Status: DC
  Administered 2012-02-27: 40 mg via INTRAVENOUS
  Filled 2012-02-27: qty 4

## 2012-02-27 MED ORDER — PANTOPRAZOLE SODIUM 40 MG PO TBEC
40.0000 mg | DELAYED_RELEASE_TABLET | Freq: Every day | ORAL | Status: DC
  Administered 2012-02-27 – 2012-02-29 (×3): 40 mg via ORAL
  Filled 2012-02-27 (×2): qty 1

## 2012-02-27 MED ORDER — ATORVASTATIN CALCIUM 20 MG PO TABS
20.0000 mg | ORAL_TABLET | Freq: Every day | ORAL | Status: DC
  Administered 2012-02-27 – 2012-02-28 (×2): 20 mg via ORAL
  Filled 2012-02-27 (×3): qty 1

## 2012-02-27 MED ORDER — ALBUTEROL SULFATE HFA 108 (90 BASE) MCG/ACT IN AERS
1.0000 | INHALATION_SPRAY | RESPIRATORY_TRACT | Status: DC | PRN
  Filled 2012-02-27: qty 6.7

## 2012-02-27 NOTE — H&P (Signed)
Attending Note Patient seen and discussed with Dr. Abner Greenspan and agrre that [name] has mild-moderated CHF exacerbation without any clear provocative cause. She will need to set up home scale to guide outpatient self  Management of her CHF.

## 2012-02-27 NOTE — Progress Notes (Signed)
Subjective: Feels better than at admission, but still coughing quite a bit.   Reports recent rhinorrhea and sinus congestion. No chest pain, but +heart burn recently (discontinued antacid 1 month ago).  Uneasy about going home.  Reports recent constipation and feeling cold.    Objective: Vital signs in last 24 hours: Filed Vitals:   02/27/12 0035 02/27/12 0241 02/27/12 0411 02/27/12 0613  BP: 149/80 120/51  140/58  Pulse: 89 79  76  Temp:  98.6 F (37 C)  99.5 F (37.5 C)  TempSrc:    Oral  Resp:  20  20  Height:      Weight:   274 lb (124.286 kg)   SpO2:  93%  94%   Weight change:   Intake/Output Summary (Last 24 hours) at 02/27/12 1033 Last data filed at 02/27/12 4540  Gross per 24 hour  Intake    604 ml  Output   2300 ml  Net  -1696 ml   Physical Exam: Vitals reviewed.  Afebrile. Net negative.  General: resting in bed HEENT:no scleral icterus, no conjunctival pallor, nearly blind (can see shadow outlines), dry MM Cardiac: RRR, no rubs, murmurs or gallops Pulm: soft inspiratory wheezing b/l (upper lobes), no crackles  Abd: soft, nontender, nondistended, BS normoactive, obese Ext: warm and well perfused, 1+ (minimal) pitting edema up to mid tibia Neuro: alert and oriented X3, cranial nerves II-XII grossly intact  Lab Results: Basic Metabolic Panel:  Lab 02/27/12 9811 02/26/12 1931  NA 142 142  K 3.7 4.1  CL 101 104  CO2 31 30  GLUCOSE 127* 99  BUN 17 17  CREATININE 1.24* 1.14*  CALCIUM 8.9 8.7  MG -- --  PHOS -- --   CBC:  Lab 02/27/12 0610 02/26/12 1931  WBC 6.9 7.3  NEUTROABS -- --  HGB 10.8* 10.4*  HCT 33.4* 32.1*  MCV 92.3 92.5  PLT 251 236   BNP:  Lab 02/26/12 1931  PROBNP 1041.0*   CBG:  Lab 02/27/12 0015  GLUCAP 112*   Anemia Panel:  Lab 02/27/12 0013  VITAMINB12 --  FOLATE --  FERRITIN --  TIBC --  IRON --  RETICCTPCT 2.2    Studies/Results: Dg Chest 2 View  02/26/2012  *RADIOLOGY REPORT*  Clinical Data: Cough, chills and  high blood pressure.  History of diabetes.  CHEST - 2 VIEW  Comparison: Chest x-ray 11/29/2011.  Findings: Lung volumes are normal.  No consolidative airspace disease.  Lateral view demonstrates minimal blunting of the costophrenic sulci bilaterally, suggestive of trace bilateral pleural effusions.  Prominent interstitial markings in the lung bases bilaterally.  Pulmonary vasculature is normal.  Heart size is mildly enlarged (unchanged).  Mediastinal contours are unremarkable.  IMPRESSION: 1.  Mild prominence of interstitial markings throughout the lung bases bilaterally.  In addition, there is evidence to suggest trace bilateral pleural effusions.  Findings could suggest sequelae of recent aspiration, or could be indicative of early bronchopneumonia.  Original Report Authenticated By: Florencia Reasons, M.D.   Medications: I have reviewed the patient's current medications. Scheduled Meds:   . amLODipine  10 mg Oral Daily  . aspirin EC  81 mg Oral Daily  . atorvastatin  20 mg Oral q1800  . brimonidine  1 drop Both Eyes UD  . carvedilol  25 mg Oral BID WC  . furosemide  20 mg Intravenous Once  . furosemide  60 mg Oral BID  . glipiZIDE  10 mg Oral Q breakfast  . heparin  5,000  Units Subcutaneous Q8H  . insulin aspart  0-9 Units Subcutaneous TID WC  . losartan  25 mg Oral Daily  . metFORMIN  500 mg Oral BID WC  . pantoprazole  40 mg Oral Q1200  . piperacillin-tazobactam (ZOSYN)  IV  3.375 g Intravenous Once  . simvastatin  20 mg Oral QHS  . sodium chloride  250 mL Intravenous Once  . vancomycin  1,000 mg Intravenous Once  . DISCONTD: furosemide  40 mg Intravenous Q12H  . DISCONTD: furosemide  40 mg Oral BID  . DISCONTD: glipiZIDE  10 mg Oral Daily  . DISCONTD: sodium chloride  1,000 mL Intravenous Once   Continuous Infusions:  PRN Meds:.ALPRAZolam, benzonatate, ondansetron  Assessment/Plan:  #Acute on chronic systolic heart failure: (echo 03/29/80 EF 50-55%); s/p diuresis (20mg  IV  yesterday, 100 mg total this morning, 2 doses). pBNP at admission 1041 up from 860 1 month PTA, no baseline established.  Cr trending up, and patient exhibits dry MM.  Will hold tonight's dose and plan for 40mg  BID tomorrow.  Continue to monitor I's and O's (but will d/c foley).  -AM BMET -check TSH   #Cough : GERD vs bronchitis; afebrile/no leukocyosis, if infectious, likely viral; no h/o of COPD/asthma; remote history of smoking -albuterol trial, tessalon pearls for symptom mgmt, continue PPI  #Normocytic Anemia : Hb stable during hospitalization, but down from baseline around 12-13; Patient reports normal colonoscopy 2-3 years ago; initial FOBT negative, retic count wnl (expected to be high), remaining of anemia panel pending, continue to monitor  #Type 2 DM : reasonably well controlled (last HbA1c 11/19/11 = 7.3).  Repeat pending. Complicated by proliferative retinopathy with near total vision loss b/l.  -Continue glipizide, metformin & alphagan   #HLD : simvastatin 20mg  daily; last lipid panel LDL = 96, 02/12/10 -FLP  #HTN: elevated this AM, before meds given, monitor and continue home meds (losartan, amlodipine)  #CKD : Cr bump this morning, likely 2/2 diuresis, monitor AM bmet  #VTE ppx: heparin tid   #Dispo : likely 1-2d, will need CM to help with home health needs to ensure meds being taken appropriately & weight checked regularly, especially in setting of impaired vision   LOS: 1 day   KAPADIA, Rajni Holsworth 02/27/2012, 10:33 AM

## 2012-02-27 NOTE — ED Provider Notes (Signed)
Medical screening examination/treatment/procedure(s) were conducted as a shared visit with non-physician practitioner(s) and myself.  I personally evaluated the patient during the encounter  See prior note  Nat Christen, MD 02/27/12 1530

## 2012-02-28 LAB — GLUCOSE, CAPILLARY
Glucose-Capillary: 68 mg/dL — ABNORMAL LOW (ref 70–99)
Glucose-Capillary: 76 mg/dL (ref 70–99)

## 2012-02-28 LAB — CBC
HCT: 32.2 % — ABNORMAL LOW (ref 36.0–46.0)
Hemoglobin: 10.4 g/dL — ABNORMAL LOW (ref 12.0–15.0)
MCH: 29.8 pg (ref 26.0–34.0)
MCHC: 32.3 g/dL (ref 30.0–36.0)
RBC: 3.49 MIL/uL — ABNORMAL LOW (ref 3.87–5.11)

## 2012-02-28 LAB — BASIC METABOLIC PANEL
BUN: 19 mg/dL (ref 6–23)
Chloride: 99 mEq/L (ref 96–112)
Glucose, Bld: 124 mg/dL — ABNORMAL HIGH (ref 70–99)
Potassium: 3.6 mEq/L (ref 3.5–5.1)
Sodium: 139 mEq/L (ref 135–145)

## 2012-02-28 LAB — LIPID PANEL
HDL: 39 mg/dL — ABNORMAL LOW (ref >39)
LDL Cholesterol: 78 mg/dL (ref 0–99)

## 2012-02-28 MED ORDER — FUROSEMIDE 20 MG PO TABS
20.0000 mg | ORAL_TABLET | Freq: Two times a day (BID) | ORAL | Status: DC
  Administered 2012-02-28: 40 mg via ORAL
  Administered 2012-02-28 – 2012-02-29 (×2): 20 mg via ORAL
  Filled 2012-02-28 (×4): qty 1

## 2012-02-28 NOTE — Progress Notes (Signed)
Subjective: Feels better than at admission, but still coughing quite a bit.   Reports recent rhinorrhea and sinus congestion. No chest pain, but +heart burn recently (discontinued antacid 1 month ago).  Uneasy about going home.  Reports recent constipation and feeling cold.    Objective: Vital signs in last 24 hours: Filed Vitals:   02/27/12 1400 02/27/12 2007 02/28/12 0442 02/28/12 0500  BP: 138/59 127/65 116/59   Pulse: 76 75 74   Temp: 98.9 F (37.2 C) 98.6 F (37 C) 99.1 F (37.3 C)   TempSrc: Oral Oral    Resp: 20 18 18    Height:      Weight:    269 lb 9.6 oz (122.29 kg)  SpO2: 99% 100% 95%    Weight change: -6 lb (-2.722 kg)  Intake/Output Summary (Last 24 hours) at 02/28/12 1020 Last data filed at 02/28/12 1008  Gross per 24 hour  Intake    600 ml  Output   3351 ml  Net  -2751 ml   Physical Exam: Vitals reviewed.  Afebrile. Net negative.  General: resting in bed HEENT:no scleral icterus, no conjunctival pallor, nearly blind (can see shadow outlines), dry MM Cardiac: RRR, no rubs, murmurs or gallops Pulm: soft inspiratory wheezing b/l (upper lobes), no crackles  Abd: soft, nontender, nondistended, BS normoactive, obese Ext: warm and well perfused, 1+ (minimal) pitting edema up to mid tibia Neuro: alert and oriented X3, cranial nerves II-XII grossly intact  Lab Results: Basic Metabolic Panel:  Lab 02/28/12 1610 02/27/12 0610  NA 139 142  K 3.6 3.7  CL 99 101  CO2 32 31  GLUCOSE 124* 127*  BUN 19 17  CREATININE 1.37* 1.24*  CALCIUM 8.6 8.9  MG -- --  PHOS -- --   CBC:  Lab 02/28/12 0852 02/27/12 0610  WBC 6.9 6.9  NEUTROABS -- --  HGB 10.4* 10.8*  HCT 32.2* 33.4*  MCV 92.3 92.3  PLT 235 251   BNP:  Lab 02/26/12 1931  PROBNP 1041.0*   CBG:  Lab 02/28/12 0606 02/27/12 2110 02/27/12 1606 02/27/12 1113 02/27/12 0609 02/27/12 0015  GLUCAP 98 125* 131* 107* 151* 112*   Anemia Panel:  Lab 02/27/12 0013  VITAMINB12 314  FOLATE 15.6  FERRITIN  107  TIBC 213*  IRON 31*  RETICCTPCT 2.2    Studies/Results: Dg Chest 2 View  02/26/2012  *RADIOLOGY REPORT*  Clinical Data: Cough, chills and high blood pressure.  History of diabetes.  CHEST - 2 VIEW  Comparison: Chest x-ray 11/29/2011.  Findings: Lung volumes are normal.  No consolidative airspace disease.  Lateral view demonstrates minimal blunting of the costophrenic sulci bilaterally, suggestive of trace bilateral pleural effusions.  Prominent interstitial markings in the lung bases bilaterally.  Pulmonary vasculature is normal.  Heart size is mildly enlarged (unchanged).  Mediastinal contours are unremarkable.  IMPRESSION: 1.  Mild prominence of interstitial markings throughout the lung bases bilaterally.  In addition, there is evidence to suggest trace bilateral pleural effusions.  Findings could suggest sequelae of recent aspiration, or could be indicative of early bronchopneumonia.  Original Report Authenticated By: Florencia Reasons, M.D.   Medications: I have reviewed the patient's current medications. Scheduled Meds:    . amLODipine  10 mg Oral Daily  . aspirin EC  81 mg Oral Daily  . atorvastatin  20 mg Oral q1800  . brimonidine  1 drop Both Eyes UD  . carvedilol  25 mg Oral BID WC  . furosemide  40 mg  Oral BID  . glipiZIDE  10 mg Oral Q breakfast  . heparin  5,000 Units Subcutaneous Q8H  . insulin aspart  0-9 Units Subcutaneous TID WC  . losartan  25 mg Oral Daily  . metFORMIN  500 mg Oral BID WC  . pantoprazole  40 mg Oral Q1200  . senna-docusate  1 tablet Oral Once  . DISCONTD: furosemide  60 mg Oral BID  . DISCONTD: simvastatin  20 mg Oral QHS   Continuous Infusions:  PRN Meds:.albuterol, ALPRAZolam, benzonatate, ondansetron  Assessment/Plan:  #Acute on chronic systolic heart failure: (echo 03/29/80 EF 50-55%); s/p diuresis (20mg  IV yesterday, 100 mg total this morning, 2 doses). pBNP at admission 1041 up from 860 1 month PTA, no baseline established.  Cr trending  up, with her lasix.  She is net negative and  6 lbs down in her weight from admission. -Continue to monitor I's and O's and daily weights -AM BMET -Will decrease her lasix to home dose( 20 mg BID) and reassess in AM.  #Cough : GERD vs bronchitis; afebrile/no leukocyosis, if infectious, likely viral; no h/o of COPD/asthma; remote history of smoking -albuterol trial, tessalon pearls for symptom mgmt, continue PPI  #Normocytic Anemia : Hb stable during hospitalization, but down from baseline around 12-13; Patient reports normal colonoscopy 2-3 years ago; initial FOBT negative, retic count wnl (expected to be high), remaining of anemia panel pending, continue to monitor  #Type 2 DM :  Complicated by proliferative retinopathy with near total vision loss b/l.  AIC - 7.7 which has deteriorated from the last AIC of 7.3 from 3 months ago. -Continue glipizide & alphagan  - We may consider increasing the dose of metformin.  #HLD :Fasting lipid panel,  LDL = 78, at goal -Continue Simvastatin 20mg  daily;  #HTN: BP stable. Continue home meds.  #CKD : Cr trending up but close to her baseline(1.2-1.3)  #VTE ppx: heparin tid   #Dispo :Pending until home health arrangements are done. Patient requests that she would like to stay one more day.  LOS: 2 days   Loraine Freid 02/28/2012, 10:20 AM

## 2012-02-28 NOTE — Progress Notes (Signed)
   CARE MANAGEMENT NOTE 02/28/2012  Patient:  [name], [name]   Account Number:  [id number]  Date Initiated:  02/28/2012  Documentation initiated by:  [facility]  Subjective/Objective Assessment:     Action/Plan:   Anticipated DC Date:  02/29/2012   Anticipated DC Plan:  HOME W HOME HEALTH SERVICES      DC Planning Services  CM consult      [facility] Choice  HOME HEALTH   Choice offered to / List presented to:  C-1 Patient        HH arranged  HH-1 RN  HH-4 NURSE'S AIDE      HH agency  [facility]   Status of service:  Completed, signed off Medicare Important Message given?   (If response is "NO", the following Medicare IM given date fields will be blank) Date Medicare IM given:   Date Additional Medicare IM given:    Discharge Disposition:  HOME W HOME HEALTH SERVICES  Per UR Regulation:    Comments:  02/28/2012 1600 Contacted [facility] for [facility] RN and aide for scheduled d/c today or 3/11. Isidoro Donning RN CCM Case Mgmt phone [phone]

## 2012-02-29 DIAGNOSIS — I129 Hypertensive chronic kidney disease with stage 1 through stage 4 chronic kidney disease, or unspecified chronic kidney disease: Secondary | ICD-10-CM

## 2012-02-29 DIAGNOSIS — N189 Chronic kidney disease, unspecified: Secondary | ICD-10-CM

## 2012-02-29 DIAGNOSIS — D649 Anemia, unspecified: Secondary | ICD-10-CM

## 2012-02-29 DIAGNOSIS — E119 Type 2 diabetes mellitus without complications: Secondary | ICD-10-CM

## 2012-02-29 DIAGNOSIS — I5023 Acute on chronic systolic (congestive) heart failure: Principal | ICD-10-CM

## 2012-02-29 LAB — BASIC METABOLIC PANEL
CO2: 31 mEq/L (ref 19–32)
Calcium: 8.7 mg/dL (ref 8.4–10.5)
Creatinine, Ser: 1.28 mg/dL — ABNORMAL HIGH (ref 0.50–1.10)
GFR calc non Af Amer: 40 mL/min — ABNORMAL LOW (ref >90)
Glucose, Bld: 60 mg/dL — ABNORMAL LOW (ref 70–99)
Sodium: 138 mEq/L (ref 135–145)

## 2012-02-29 MED ORDER — FUROSEMIDE 20 MG PO TABS: 20.0000 mg | ORAL_TABLET | Freq: Two times a day (BID) | ORAL | Status: DC

## 2012-02-29 MED ORDER — PANTOPRAZOLE SODIUM 40 MG PO TBEC: 40.0000 mg | DELAYED_RELEASE_TABLET | Freq: Every day | ORAL | Status: DC

## 2012-02-29 NOTE — Progress Notes (Signed)
Pt had CBG of 68, gave pt OJ, and graham crackers and PB, checked back and went back to 76, gave another OJ and will continue to monitor, Thanks, Lavonda Jumbo RN

## 2012-02-29 NOTE — Progress Notes (Signed)
Went over all D/C instructions with pt and Granddaughter since pt has visual impairment. Aware of med rec, follow up appts, prescriptions & has all belongings. D/C per w/C by RN to car. Seatbelt on  and granddaughter took pt to home. Given info on 2 Gm Na Diet T Print production planner

## 2012-02-29 NOTE — Plan of Care (Signed)
Problem: Phase I Progression Outcomes Goal: EF % per last Echo/documented,Core Reminder form on chart Outcome: Completed/Met Date Met:  02/29/12 02/03/12 EF 50 - 55 %

## 2012-02-29 NOTE — Plan of Care (Signed)
Problem: Discharge Progression Outcomes Goal: Other Discharge Outcomes/Goals Outcome: Completed in [facility] Date Met:  02/29/12 Pt states she ran out of some of her meds. MD transferred prescriptions to [facility] and placed in chart so that is where her refills will be. Family agreed. Marisa Cyphers RN

## 2012-02-29 NOTE — Plan of Care (Signed)
Problem: Consults Goal: Heart Failure Patient Education (See Patient Education module for education specifics.)  Outcome: Adequate for Discharge Pt has HF book went over daily weights and 2 Gm Na diet. She does not cook for herself.   Problem: Discharge Progression Outcomes Goal: Able to perform self care activities Outcome: Adequate for Discharge Pt legally blind needs assistance from family.

## 2012-02-29 NOTE — Plan of Care (Signed)
Problem: Phase II Progression Outcomes Goal: Case manager referral Outcome: Completed/Met Date Met:  02/29/12 Arranged for [facility] to house using [facility] RN

## 2012-02-29 NOTE — Progress Notes (Signed)
She is asymptomatic after diuresis.  Slight rise in creatinine; agree that we may have her too dry.  Final adjustment will be best done as out-patient.  She tells me that some of her meds run out before refills.  Her home dose of lasix may be OK if she actually takes it. Please have discharge planner go over plans for medication adherence.  This may include advice where she can get $4 refills and may include advice about daily pill-minders, etc.

## 2012-02-29 NOTE — Progress Notes (Signed)
Subjective: Feeling much better, improved SOB/rhinorrhea/sinus congestion.  No CP.  Objective: Vital signs in last 24 hours: Filed Vitals:   02/28/12 0500 02/28/12 1400 02/28/12 2130 02/29/12 0503  BP:  118/46 130/53 120/69  Pulse:  72 79 80  Temp:  98.4 F (36.9 C) 98.7 F (37.1 C) 99 F (37.2 C)  TempSrc:  Oral Oral Oral  Resp:  18 18 18   Height:      Weight: 269 lb 9.6 oz (122.29 kg)   269 lb (122.018 kg)  SpO2:  96% 97% 91%   Weight change: -9.6 oz (-0.272 kg)  Intake/Output Summary (Last 24 hours) at 02/29/12 1025 Last data filed at 02/29/12 0850  Gross per 24 hour  Intake   1680 ml  Output   1452 ml  Net    228 ml   Physical Exam: Vital signs reviewed. Afebrile. Normotensive. General: sitting comfortably in chair, no acute distress HEENT: no scleral icterus, nearly blind b/l Cardiac: RRR, no rubs, murmurs or gallops Pulm: clear to auscultation bilaterally, moving normal volumes of air Abd: soft, nontender, nondistended, BS present Ext: warm and well perfused, minimal to almost no pedal edema Neuro: alert and oriented X3  Lab Results: Basic Metabolic Panel:  Lab 02/29/12 1610 02/28/12 0852  NA 138 139  K 4.2 3.6  CL 97 99  CO2 31 32  GLUCOSE 60* 124*  BUN 20 19  CREATININE 1.28* 1.37*  CALCIUM 8.7 8.6  MG -- --  PHOS -- --   CBC:  Lab 02/28/12 0852 02/27/12 0610  WBC 6.9 6.9  NEUTROABS -- --  HGB 10.4* 10.8*  HCT 32.2* 33.4*  MCV 92.3 92.3  PLT 235 251   BNP:  Lab 02/26/12 1931  PROBNP 1041.0*   CBG:  Lab 02/29/12 0608 02/28/12 2250 02/28/12 2214 02/28/12 1612 02/28/12 1107 02/28/12 0606  GLUCAP 75 76 68* 76 114* 98   Hemoglobin A1C:  Lab 02/27/12 0013  HGBA1C 7.7*   Fasting Lipid Panel:  Lab 02/28/12 0852  CHOL 136  HDL 39*  LDLCALC 78  TRIG 94  CHOLHDL 3.5  LDLDIRECT --   Thyroid Function Tests:  Lab 02/27/12 1256  TSH 1.069  T4TOTAL --  FREET4 --  T3FREE --  THYROIDAB --   Anemia Panel:  Lab 02/27/12 0013    VITAMINB12 314  FOLATE 15.6  FERRITIN 107  TIBC 213*  IRON 31*  RETICCTPCT 2.2   Medications: I have reviewed the patient's current medications. Scheduled Meds:   . amLODipine  10 mg Oral Daily  . aspirin EC  81 mg Oral Daily  . atorvastatin  20 mg Oral q1800  . brimonidine  1 drop Both Eyes UD  . carvedilol  25 mg Oral BID WC  . furosemide  20 mg Oral BID  . glipiZIDE  10 mg Oral Q breakfast  . heparin  5,000 Units Subcutaneous Q8H  . insulin aspart  0-9 Units Subcutaneous TID WC  . losartan  25 mg Oral Daily  . metFORMIN  500 mg Oral BID WC  . pantoprazole  40 mg Oral Q1200  . DISCONTD: furosemide  40 mg Oral BID   Continuous Infusions:  PRN Meds:.albuterol, ALPRAZolam, benzonatate, ondansetron  Assessment/Plan: # Acute on chronic systolic heart failure: Symptomatically improved; Cr trending back down; in/out about net even, with almost 10lb weight loss; (echo 02/03/12 EF 50-55%); s/p diuresis. pBNP at admission 1041 up from 860 1 month PTA, no baseline established.  TSH wnl. Will plan to d/c  with home dose lasix 20 BID and HH to help monitor fluid status  #Cough : improved, likely 2/2 GERD vs bronchitis; afebrile/no leukocyosis, if infectious, likely viral; no h/o of COPD/asthma; remote history of smoking; was written for albuterol trial, but was prn and patient never asked for it; continue PPI at d/c  #Normocytic Anemia : Hb stable during hospitalization, but down from baseline around 12-13; Patient reports normal colonoscopy 2-3 years ago; initial FOBT negative, retic count wnl (expected to be high), remaining of anemia panel wnl, likely ACD given h/o DM and CHF  #Type 2 DM : Event of hypoglycemia overnight, likely due to different diet at hospital vs at home ,and she was continued on glipizide during hospitalization; reasonably well controlled at baseline (HbA1c 02/27/12 = 7.7). Complicated by proliferative retinopathy with near total vision loss b/l.  -Continue glipizide,  metformin & alphagan   #HLD : simvastatin 20mg  daily; well managed;  lipid panel LDL = 78, 02/28/12   #HTN: well controlled; continue home meds (losartan, amlodipine)   #CKD : Cr trending down, bump likely 2/2 over diuresis; CKD likely 2/2 DM & HTN  #VTE ppx: heparin tid   #Dispo : d/c home today with HH   LOS: 3 days   KAPADIA, Izabellah Dadisman 02/29/2012, 10:25 AM

## 2012-02-29 NOTE — Discharge Summary (Signed)
Internal Medicine Teaching [facility] Discharge Note  Name: [name] MRN: [id number] DOB: 05/12/1936 76 y.o.  Date of Admission: 02/26/2012  4:45 PM Date of Discharge: 02/29/2012 Attending Physician: Ulyess Mort, MD  Discharge Diagnosis: # Acute on Chronic Systolic Heart Failure (EF 50-55%, 02/03/12) # Cough, likely 2/2 GERD vs Bronchitis # Normocytic Anemia #Type 2 DM # HTN # CKD  Discharge Medications: Medication List  As of 02/29/2012 10:53 AM   STOP taking these medications         losartan 25 MG tablet         TAKE these medications         ALPRAZolam 0.5 MG tablet   Commonly known as: XANAX   Take 0.5 mg by mouth at bedtime as needed. For sleep or anxiety      amLODipine 10 MG tablet   Commonly known as: NORVASC   Take 10 mg by mouth daily.      aspirin EC 81 MG tablet   Take 81 mg by mouth daily.      brimonidine 0.15 % ophthalmic solution   Commonly known as: ALPHAGAN   Place 1 drop into both eyes as directed.      carvedilol 25 MG tablet   Commonly known as: COREG   Take 25 mg by mouth 2 (two) times daily with a meal.      CRESTOR 10 MG tablet   Generic drug: rosuvastatin   Take 10 mg by mouth daily.      furosemide 20 MG tablet   Commonly known as: LASIX   Take 20 mg by mouth 2 (two) times daily.      furosemide 20 MG tablet   Commonly known as: LASIX   Take 1 tablet (20 mg total) by mouth 2 (two) times daily.      glipiZIDE 10 MG 24 hr tablet   Commonly known as: GLUCOTROL XL   Take 10 mg by mouth daily.      metFORMIN 500 MG tablet   Commonly known as: GLUCOPHAGE   Take 500 mg by mouth 2 (two) times daily with a meal.      pantoprazole 40 MG tablet   Commonly known as: PROTONIX   Take 1 tablet (40 mg total) by mouth daily at 12 noon.      simvastatin 20 MG tablet   Commonly known as: ZOCOR   Take 1 tablet (20 mg total) by mouth at bedtime.            Disposition and follow-up:   [name] was  discharged from [facility] in Good condition.  At hospital follow up, please  1. Assess HF symptoms to determine if lasix dose needs to be changed (& check BMET for renal function) 2. Assess patient's cough  Follow-up Appointments: Follow-up Information    Follow up with Darnelle Maffucci, MD on 03/07/2012. (9:15 am for hospital follow up)    Contact information:   [address] [place] [address] [phone]       Follow up with [facility]. [facility] RN and aide)    Contact information:   [phone]        Discharge Orders    Future Appointments: Provider: Department: Dept Phone: Center:   03/04/2012 10:00 AM Wendall Stade, MD [facility] [phone] [facility]   03/07/2012 9:15 AM Darnelle Maffucci, MD [facility] [phone] [facility]     Future Orders Please Complete By  Expires   Diet - low sodium heart healthy      Diet Carb Modified      Increase activity slowly      Discharge instructions      Comments:   Please hold Losartan until your follow up appointment at the [facility] Medicine Outpatient Clinic.  Please bring all of your medications with you to this visit.  At this visit, we can reroute all of your scripts to the [facility] that you desire (I have changed your pharmacy to [facility] on [address]).  Please try to keep a journal of your daily weights.  If you have any new or worsening symptoms, please call the clinic at [phone], or go to your nearest emergency department.   Call MD for:  temperature >100.4      Call MD for:  persistant nausea and vomiting      Call MD for:  severe uncontrolled pain      Call MD for:  difficulty breathing, headache or visual disturbances      Call MD for:  persistant dizziness or light-headedness      Call MD for:  extreme fatigue         Consultations:  None  Procedures Performed:  Dg Chest 2 View  02/26/2012  *RADIOLOGY REPORT*  Clinical Data: Cough, chills and high blood  pressure.  History of diabetes.  CHEST - 2 VIEW  Comparison: Chest x-ray 11/29/2011.  Findings: Lung volumes are normal.  No consolidative airspace disease.  Lateral view demonstrates minimal blunting of the costophrenic sulci bilaterally, suggestive of trace bilateral pleural effusions.  Prominent interstitial markings in the lung bases bilaterally.  Pulmonary vasculature is normal.  Heart size is mildly enlarged (unchanged).  Mediastinal contours are unremarkable.  IMPRESSION: 1.  Mild prominence of interstitial markings throughout the lung bases bilaterally.  In addition, there is evidence to suggest trace bilateral pleural effusions.  Findings could suggest sequelae of recent aspiration, or could be indicative of early bronchopneumonia.  Original Report Authenticated By: Florencia Reasons, M.D.   Admission HPI: Patient is a 76 y.o., female with PMHX of nonobstructive CAD, chronic systolic heart failure with last echo 50-55% and diabetes mellitus, type 2, uncontrolled, who presents to [facility] for evaluation of 1 month history of minimally productive progressively worsening cough with mucous production. Admits to associated chills, burping, orthopnea (which is worse over last 1 day - typically using 2-3 pillows, but recently requiring to sit upright, PND (having to sleep upright) Denies associated chest pain and night sweats. Has had exposure to sick contact with similar symptoms (nephew has been sick with a cough).  Of note, the patient was seen in ED on 1/29 for increased edema, told to increase lasix to 40mg  bid. She followed up with her cardiologist (Dr. [name]), who felt the edema was improving and that she could go back down to 20mg  bid. She was told to increase the lasix back up if she had worsening edema or SOB, but she has not done so. Pt does have a scale at home, but has not yet set it up. Pt is not on an ARB - previously had ACE-I cough. is not a Current smoker. does not have a history of asthma/  COPD. does have a history of acid reflux - not currently on therapy. Her oldest son helps with medicine at her home.  Denies CP, abdominal pain, dysuria, HA. 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  Admission Physical Exam:  98.1 149/80  89 20 88-95%/RA  GEN: No apparent distress. Alert and oriented x 3. Pleasant, conversant, and cooperative to exam.  HEENT: head is autraumatic and normocephalic. Neck is supple without palpable masses or lymphadenopathy. No JVD appreciated. Vision substantially diminished. Unable to test EOM. Pupils nonreactive, unable to apprecaite pupil on R. Sclerae anicteric. Conjunctivae with injection. Mucous membranes are moist. Oropharynx is without erythema, exudates, or other abnormal lesions.  RESP: Lungs are clear to ascultation bilaterally with good air movement. No wheezes, ronchi, or rubs.  CARDIOVASCULAR: regular rate, normal rhythm. Clear S1, S2, 2/6 HSM @ USB  ABDOMEN: obese, non-tender, non-distended. Bowels sounds present in all quadrants and normoactive. No palpable masses.  EXT: warm and dry. No clubbing or cyanosis. 2+ pitting edema to mid-calf b/l.  SKIN: warm and dry with normal turgor. No rashes or abnormal lesions observed.  NEURO: communicating freely, strength preserved in b/l UE/LE   Admission ab results:  Basic Metabolic Panel:   [facility]  02/26/12 1931   NA  142   K  4.1   CL  104   CO2  30   GLUCOSE  99   BUN  17   CREATININE  1.14*   CALCIUM  8.7   MG  --   PHOS  --    CBC:   Basename  02/26/12 1931   WBC  7.3   NEUTROABS  --   HGB  10.4*   HCT  32.1*   MCV  92.5   PLT  236    Cardiac Enzymes:  Troponin [facility] Test)   [facility]  02/26/12 1947   TROPIPOC  0.00    BNP:   Basename  02/26/12 1931   PROBNP  1041.0*    CBG:   Basename  02/27/12 0015   GLUCAP  112*     Hospital Course by problem list: # Acute on chronic systolic heart failure: Symptomatically improved; Cr bumped to 1.37 after initial diuresis,  but then began to trend back down; in/out about net even on day of discharge, with almost 10lb weight loss during hospitalization; Last echo was 02/03/12 EF 50-55%;  pBNP at admission 1041 up from 860 1 month PTA, no baseline established. TSH wnl. Will plan to d/c with home dose lasix 20 BID and HH to help monitor fluid status   #Cough : improved, likely 2/2 GERD vs bronchitis; afebrile/no leukocyosis, if infectious, likely viral; no h/o of COPD/asthma; remote history of smoking; was written for albuterol trial, but was prn and patient never asked for it; continue PPI at d/c, as symptoms seem to have worsened after discontinuing PPI 1 month PTA.  #Normocytic Anemia : Hb stable during hospitalization, but down from baseline around 12-13; Patient reports normal colonoscopy 2-3 years ago; initial FOBT negative, retic count wnl (expected to be high), remaining of anemia panel wnl, likely ACD given h/o DM and CHF   #Type 2 DM : One event of hypoglycemia during the evening prior to discharge, likely due to different diet at hospital vs at home ,and she was continued on glipizide during hospitalization.  Reasonably well controlled at baseline (HbA1c 02/27/12 = 7.7). Complicated by proliferative retinopathy with near total vision loss b/l.  -Continue glipizide, metformin & alphagan   #HLD : simvastatin 20mg  daily; well managed; lipid panel LDL = 78, 02/28/12   #HTN: well controlled; continue home meds (losartan, amlodipine)   #CKD : Cr trending down, bump likely 2/2 over diuresis; CKD likely 2/2 DM & HTN; baseline ~1.2  Discharge Vitals:  BP 130/70  Pulse 80  Temp(Src) 99 F (37.2 C) (Oral)  Resp 18  Ht 5\' 4"  (1.626 m)  Wt 269 lb (122.018 kg)  BMI 46.17 kg/m2  SpO2 91%  Discharge Labs:  Results for orders placed during the hospital encounter of 02/26/12 (from the past 24 hour(s))  GLUCOSE, CAPILLARY     Status: Abnormal   Collection Time   02/28/12 11:07 AM      Component Value Range    Glucose-Capillary 114 (*) 70 - 99 (mg/dL)   Comment 1 Documented in Chart     Comment 2 Notify RN    GLUCOSE, CAPILLARY     Status: Normal   Collection Time   02/28/12  4:12 PM      Component Value Range   Glucose-Capillary 76  70 - 99 (mg/dL)   Comment 1 Documented in Chart     Comment 2 Notify RN    GLUCOSE, CAPILLARY     Status: Abnormal   Collection Time   02/28/12 10:14 PM      Component Value Range   Glucose-Capillary 68 (*) 70 - 99 (mg/dL)   Comment 1 Notify RN    GLUCOSE, CAPILLARY     Status: Normal   Collection Time   02/28/12 10:50 PM      Component Value Range   Glucose-Capillary 76  70 - 99 (mg/dL)   Comment 1 Notify RN    BASIC METABOLIC PANEL     Status: Abnormal   Collection Time   02/29/12  5:40 AM      Component Value Range   Sodium 138  135 - 145 (mEq/L)   Potassium 4.2  3.5 - 5.1 (mEq/L)   Chloride 97  96 - 112 (mEq/L)   CO2 31  19 - 32 (mEq/L)   Glucose, Bld 60 (*) 70 - 99 (mg/dL)   BUN 20  6 - 23 (mg/dL)   Creatinine, Ser 1.61 (*) 0.50 - 1.10 (mg/dL)   Calcium 8.7  8.4 - 09.6 (mg/dL)   GFR calc non Af Amer 40 (*) >90 (mL/min)   GFR calc Af Amer 47 (*) >90 (mL/min)  GLUCOSE, CAPILLARY     Status: Normal   Collection Time   02/29/12  6:08 AM      Component Value Range   Glucose-Capillary 75  70 - 99 (mg/dL)   Comment 1 Notify RN     Comment 2 Documented in Chart      Signed: KAPADIA, Bengie Kaucher 02/29/2012, 10:53 AM

## 2012-03-01 ENCOUNTER — Other Ambulatory Visit: Payer: Self-pay | Admitting: *Deleted

## 2012-03-02 MED ORDER — METFORMIN HCL 500 MG PO TABS: 500.0000 mg | ORAL_TABLET | Freq: Two times a day (BID) | ORAL | Status: DC

## 2012-03-04 ENCOUNTER — Ambulatory Visit (INDEPENDENT_AMBULATORY_CARE_PROVIDER_SITE_OTHER): Payer: Medicare Other | Admitting: Cardiovascular Disease

## 2012-03-04 ENCOUNTER — Encounter: Payer: Self-pay | Admitting: Cardiovascular Disease

## 2012-03-04 ENCOUNTER — Ambulatory Visit (HOSPITAL_COMMUNITY)
Admission: RE | Admit: 2012-03-04 | Discharge: 2012-03-04 | Disposition: A | Discharge status: Home / Self Care | Payer: Medicare Other | Source: Ambulatory Visit | Attending: Cardiovascular Disease | Admitting: Cardiovascular Disease

## 2012-03-04 VITALS — BP 124/58 | HR 88 | Wt 272.4 lb

## 2012-03-04 DIAGNOSIS — I5022 Chronic systolic (congestive) heart failure: Secondary | ICD-10-CM

## 2012-03-04 DIAGNOSIS — I517 Cardiomegaly: Secondary | ICD-10-CM | POA: Insufficient documentation

## 2012-03-04 DIAGNOSIS — I10 Essential (primary) hypertension: Secondary | ICD-10-CM

## 2012-03-04 DIAGNOSIS — G4733 Obstructive sleep apnea (adult) (pediatric): Secondary | ICD-10-CM

## 2012-03-04 DIAGNOSIS — I509 Heart failure, unspecified: Secondary | ICD-10-CM

## 2012-03-04 DIAGNOSIS — I5189 Other ill-defined heart diseases: Secondary | ICD-10-CM

## 2012-03-04 DIAGNOSIS — R0609 Other forms of dyspnea: Secondary | ICD-10-CM

## 2012-03-04 DIAGNOSIS — M5380 Other specified dorsopathies, site unspecified: Secondary | ICD-10-CM | POA: Insufficient documentation

## 2012-03-04 DIAGNOSIS — E119 Type 2 diabetes mellitus without complications: Secondary | ICD-10-CM | POA: Insufficient documentation

## 2012-03-04 DIAGNOSIS — G4730 Sleep apnea, unspecified: Secondary | ICD-10-CM

## 2012-03-04 DIAGNOSIS — I519 Heart disease, unspecified: Secondary | ICD-10-CM

## 2012-03-04 DIAGNOSIS — R0602 Shortness of breath: Secondary | ICD-10-CM | POA: Insufficient documentation

## 2012-03-04 DIAGNOSIS — E785 Hyperlipidemia, unspecified: Secondary | ICD-10-CM

## 2012-03-04 HISTORY — DX: Obstructive sleep apnea (adult) (pediatric): G47.33

## 2012-03-04 LAB — BASIC METABOLIC PANEL
BUN: 26 mg/dL — ABNORMAL HIGH (ref 6–23)
CO2: 31 mEq/L (ref 19–32)
Chloride: 102 mEq/L (ref 96–112)
Creatinine, Ser: 1.4 mg/dL — ABNORMAL HIGH (ref 0.4–1.2)

## 2012-03-04 NOTE — Patient Instructions (Signed)
Your physician recommends that you schedule a follow-up appointment in: 3 MONTHS WITH DR [facility] Your physician recommends that you continue on your current medications as directed. Please refer to the Current Medication list given to you today. You have been referred to CHF CLINIC  DX 428.0   AND  PULMONARY  DX SLEEP APNEA Your physician recommends that you return for lab work in: TODAY  BMET BNP   DX 428.0 A chest x-ray takes a picture of the organs and structures inside the chest, including the heart, lungs, and blood vessels. This test can show several things, including, whether the heart is enlarges; whether fluid is building up in the lungs; and whether pacemaker / defibrillator leads are still in place. DX 428.0

## 2012-03-04 NOTE — Assessment & Plan Note (Signed)
EF recently not so  Bad 50-55%.  Check BNP and BMET.  More likely diastolic dysfunction with ? OSA and aspiration.  F/U BMET and BNP today.  Has appt with primary at [facility] Monday CXR today.  If BNP up would add zaroxyln 2.5 daily.  F/U CHF clinic.  I tried to explain to her about daily weights and sliding scale Lasix but I am not sure she will be compliant.  Cousin was with her today and he understands.

## 2012-03-04 NOTE — Assessment & Plan Note (Signed)
Discussed low carb diet.  Target hemoglobin A1c is 6.5 or less.  Continue current medications.  

## 2012-03-04 NOTE — Assessment & Plan Note (Signed)
Cholesterol is at goal.  Continue current dose of statin and diet Rx.  No myalgias or side effects.  F/U  LFT's in 6 months. Lab Results  Component Value Date   LDLCALC 78 02/28/2012

## 2012-03-04 NOTE — Progress Notes (Signed)
Patient ID: [name], female   DOB: 1936-02-16, 76 y.o.   MRN: [id number] [name] is a 76 y.o. female who presents for follow up on edema. She has a history of nonischemic cardiomyopathy, EF 30-35%, minimal nonobstructive coronary artery disease, hypertension, hyperlipidemia, diabetes, diabetic retinopathy. Echocardiogram 10/09: EF 30-35%, mild LVH, increased LV filling pressures, mild LAE. LHC 05/25/06: No renal artery stenosis, proximal LAD 40%, proximal D1 40%. She was seen in the emergency room 12/9 as well as 1/29 with lower extremity edema. She was given extra Lasix back in December and asked to followup in the office. She presented back to the emergency room with increased edema and was given Lasix again. Labs 01/19/12: Potassium 4.5, BUN 22, creatinine 1.30, BNP 860, hemoglobin 12.9. Chest x-ray at that time was negative for edema.   She was previously on 20 mg twice a day of Lasix. Since her trip to the emergency room 1/29, she has been taking 40 mg twice a day. Her edema is improved. She describes symptoms of orthopnea(Sleeping in a recliner). No PND. No chest pain. No syncope. No palpitations. Since increasing her Lasix, she feels better. Her swelling is down. She is breathing better. She is now sleeping in her bed again. She describes chronic NYHA class 2B-3 symptoms.  Just D/C from hospital for bronchitis, pneumonia ? Diastolic CHF.  Echo reviewed from 2/13  EF 50-55%   ROS: Denies fever, malais, weight loss, blurry vision, decreased visual acuity, cough, sputum,  hemoptysis, pleuritic pain, palpitaitons, heartburn, abdominal pain, melena, lower extremity edema, claudication, or rash.  All other systems reviewed and negative  General: Affect appropriate Chronically ill obese black female HEENT: normal Neck supple with no adenopathy JVP normal no bruits no thyromegaly Lungs clear with no wheezing and good diaphragmatic motion Heart:  S1/S2 no murmur, no rub, gallop or  click PMI normal Abdomen: benighn, BS positve, no tenderness, no AAA no bruit.  No HSM or HJR Distal pulses intact with no bruits No edema Neuro non-focal Skin warm and dry No muscular weakness   Current Outpatient Prescriptions  Medication Sig Dispense Refill  . ALPRAZolam (XANAX) 0.5 MG tablet Take 0.5 mg by mouth at bedtime as needed. For sleep or anxiety      . amLODipine (NORVASC) 10 MG tablet Take 10 mg by mouth daily.        Marland Kitchen aspirin EC 81 MG tablet Take 81 mg by mouth daily.        . carvedilol (COREG) 25 MG tablet Take 25 mg by mouth 2 (two) times daily with a meal.      . furosemide (LASIX) 20 MG tablet Take 20 mg by mouth 2 (two) times daily.       . furosemide (LASIX) 20 MG tablet Take 1 tablet (20 mg total) by mouth 2 (two) times daily.  30 tablet  5  . glipiZIDE (GLUCOTROL XL) 10 MG 24 hr tablet Take 10 mg by mouth daily.      . metFORMIN (GLUCOPHAGE) 500 MG tablet Take 1 tablet (500 mg total) by mouth 2 (two) times daily with a meal.  60 tablet  6  . pantoprazole (PROTONIX) 40 MG tablet Take 1 tablet (40 mg total) by mouth daily at 12 noon.  30 tablet  3  . simvastatin (ZOCOR) 20 MG tablet Take 1 tablet (20 mg total) by mouth at bedtime.  30 tablet  6  . brimonidine (ALPHAGAN) 0.15 % ophthalmic solution Place 1 drop into both  eyes as directed.  15 mL  0  . CRESTOR 10 MG tablet Take 10 mg by mouth daily.         Allergies  Lisinopril  Electrocardiogram:  NSR rate 79 nonspecific ST.T wave changes.   Assessment and Plan

## 2012-03-04 NOTE — Assessment & Plan Note (Signed)
Refer to pulmonary for testing and further consideration

## 2012-03-04 NOTE — Assessment & Plan Note (Signed)
Well controlled.  Continue current medications and low sodium Dash type diet.    

## 2012-03-07 ENCOUNTER — Encounter: Payer: Medicare Other | Admitting: Internal Medicine

## 2012-03-10 ENCOUNTER — Ambulatory Visit (INDEPENDENT_AMBULATORY_CARE_PROVIDER_SITE_OTHER): Payer: Medicare Other | Admitting: Internal Medicine

## 2012-03-10 ENCOUNTER — Encounter: Payer: Self-pay | Admitting: Internal Medicine

## 2012-03-10 VITALS — BP 105/53 | HR 69 | Temp 97.0°F | Wt 269.9 lb

## 2012-03-10 DIAGNOSIS — I5022 Chronic systolic (congestive) heart failure: Secondary | ICD-10-CM

## 2012-03-10 DIAGNOSIS — I509 Heart failure, unspecified: Secondary | ICD-10-CM

## 2012-03-10 DIAGNOSIS — E119 Type 2 diabetes mellitus without complications: Secondary | ICD-10-CM

## 2012-03-10 DIAGNOSIS — N289 Disorder of kidney and ureter, unspecified: Secondary | ICD-10-CM

## 2012-03-10 DIAGNOSIS — I10 Essential (primary) hypertension: Secondary | ICD-10-CM

## 2012-03-10 DIAGNOSIS — N189 Chronic kidney disease, unspecified: Secondary | ICD-10-CM

## 2012-03-10 NOTE — Assessment & Plan Note (Addendum)
Patient's weight is near baseline, no changes to diuretic regiment at this point. Basic metabolic panel today along with a proBNP, plan for followup in one month. Advised the patient to call the clinic if her weight is above 270 pounds.

## 2012-03-10 NOTE — Assessment & Plan Note (Signed)
Continue current regiment, and recheck A1c in 3 months.

## 2012-03-10 NOTE — Progress Notes (Signed)
Patient ID: [name], female   DOB: January 31, 1936, 76 y.o.   MRN: [id number]  HPI:   Patient is a 76 year old female with past medical history listed below present outpatient clinic for hospital followup after she was admitted for congestive heart failure. Patient's volume status is near baseline. Denies shortness of breath, chest pain or any other complaints. Was recently seen by her cardiologist, which did not make any changes to her medical regimen. No other complaints today.  Review of Systems: Negative except per history of present illness  Physical Exam:  Nursing notes and vitals reviewed General:  alert, well-developed, and cooperative to examination.   Lungs:  normal respiratory effort, no accessory muscle use, normal breath sounds, no crackles, and no wheezes. Heart:  normal rate, regular rhythm, no murmurs, no gallop, and no rub.   Abdomen:  soft, non-tender, normal bowel sounds, no distention, no guarding, no rebound tenderness, no hepatomegaly, and no splenomegaly.   Extremities:  No cyanosis, clubbing, edema Neurologic:  alert & oriented X3, blind, in wheelchair  Meds: Medications Prior to Admission  Medication Sig Dispense Refill  . ALPRAZolam (XANAX) 0.5 MG tablet Take 0.5 mg by mouth at bedtime as needed. For sleep or anxiety      . amLODipine (NORVASC) 10 MG tablet Take 10 mg by mouth daily.        Marland Kitchen aspirin EC 81 MG tablet Take 81 mg by mouth daily.        . brimonidine (ALPHAGAN) 0.15 % ophthalmic solution Place 1 drop into both eyes as directed.  15 mL  0  . carvedilol (COREG) 25 MG tablet Take 25 mg by mouth 2 (two) times daily with a meal.      . CRESTOR 10 MG tablet Take 10 mg by mouth daily.       . furosemide (LASIX) 20 MG tablet Take 1 tablet (20 mg total) by mouth 2 (two) times daily.  30 tablet  5  . glipiZIDE (GLUCOTROL XL) 10 MG 24 hr tablet Take 10 mg by mouth daily.      Marland Kitchen losartan (COZAAR) 25 MG tablet       . metFORMIN (GLUCOPHAGE) 500 MG tablet Take  1 tablet (500 mg total) by mouth 2 (two) times daily with a meal.  60 tablet  6  . pantoprazole (PROTONIX) 40 MG tablet Take 1 tablet (40 mg total) by mouth daily at 12 noon.  30 tablet  3  . simvastatin (ZOCOR) 20 MG tablet Take 1 tablet (20 mg total) by mouth at bedtime.  30 tablet  6   No current facility-administered medications on file as of 03/10/2012.    Allergies: Lisinopril Past Medical History  Diagnosis Date  . Coronary artery disease     LHC 05/25/06: No renal artery stenosis, proximal LAD 40%, proximal D1 40%  . Diabetes mellitus     a1c 7.3 in 10/2011  . Hypertension   . Chronic systolic heart failure     Echocardiogram 10/09: EF 30-35%, mild LVH, increased LV filling pressures, mild LAE  . Diabetic retinopathy   . NICM (nonischemic cardiomyopathy)   . Blood transfusion without reported diagnosis   . Anxiety   . Arthritis    Past Surgical History  Procedure Date  . Coronary angioplasty with stent placement   . Eye surgery 2012    both eyes   No family history on file. History   Social History  . Marital Status: Widowed    Spouse Name: N/A  Number of Children: 5  . Years of Education: RN school   Occupational History  . Retired     formerly worked as a Engineer, civil (consulting).   Social History Main Topics  . Smoking status: Former Smoker    Types: Cigarettes    Quit date: 12/21/1978  . Smokeless tobacco: Not on file   Comment: does have second hand smoke exposure  . Alcohol Use: No  . Drug Use: No  . Sexually Active: Not on file   Other Topics Concern  . Not on file   Social History Narrative   Lives with son who helps with her medications.

## 2012-03-10 NOTE — Assessment & Plan Note (Signed)
Well controlled on current treatment, No new changes made today, Will continue to monitor.   

## 2012-03-10 NOTE — Assessment & Plan Note (Signed)
Check basic metabolic panel today. 

## 2012-03-10 NOTE — Patient Instructions (Signed)
--   Please take your medications as prescribed.

## 2012-03-11 LAB — BASIC METABOLIC PANEL
BUN: 30 mg/dL — ABNORMAL HIGH (ref 6–23)
CO2: 24 mEq/L (ref 19–32)
Calcium: 8.8 mg/dL (ref 8.4–10.5)
Creat: 1.48 mg/dL — ABNORMAL HIGH (ref 0.50–1.10)
Glucose, Bld: 181 mg/dL — ABNORMAL HIGH (ref 70–99)

## 2012-03-14 ENCOUNTER — Telehealth: Payer: Self-pay | Admitting: *Deleted

## 2012-03-14 NOTE — Telephone Encounter (Signed)
Call from [place], [facility].  Requesting order for:  Social Worker to discuss community resources. Thanks

## 2012-03-14 NOTE — Telephone Encounter (Signed)
Order is placed, thanks.

## 2012-03-15 ENCOUNTER — Other Ambulatory Visit: Payer: Self-pay | Admitting: Internal Medicine

## 2012-03-22 ENCOUNTER — Telehealth: Payer: Self-pay | Admitting: *Deleted

## 2012-03-22 NOTE — Telephone Encounter (Signed)
Return call to [facility], [facility].  Requesting verbal order for "pt to be seen once a week"  Until she can be seen by a cardiologist for CHF. Verbal order given - if not ok, let me know.

## 2012-03-23 NOTE — Telephone Encounter (Signed)
That sounds fine, thanks  

## 2012-03-24 ENCOUNTER — Ambulatory Visit (INDEPENDENT_AMBULATORY_CARE_PROVIDER_SITE_OTHER): Payer: Medicare Other | Admitting: Pulmonary Disease

## 2012-03-24 ENCOUNTER — Encounter: Payer: Self-pay | Admitting: Pulmonary Disease

## 2012-03-24 VITALS — BP 126/82 | HR 83 | Temp 98.6°F | Ht 64.0 in | Wt 260.4 lb

## 2012-03-24 DIAGNOSIS — G4733 Obstructive sleep apnea (adult) (pediatric): Secondary | ICD-10-CM

## 2012-03-24 NOTE — Assessment & Plan Note (Signed)
The patient's history is very suggestive of sleep disordered breathing.  It is unclear how much of this is related to obstructive sleep apnea versus Cheyne-Stokes respirations associated with her underlying heart disease.  Had a long discussion with her and her family about sleep apnea, including its impact to her quality of life and cardiovascular health.  At this point, I think she needs to have a sleep study done, and the patient is agreeable.

## 2012-03-24 NOTE — Patient Instructions (Signed)
Will set up for a sleep study, and arrange followup with me once the results are available.

## 2012-03-24 NOTE — Progress Notes (Signed)
  Subjective:    Patient ID: [name], female    DOB: August 21, 1936, 76 y.o.   MRN: [id number]  HPI The patient is a 76 year old female who I've been asked to see for possible obstructive sleep apnea.  She has significant underlying cardiac disease, and the question is been raised whether sleep disordered breathing is contributing to her issues.  The patient has been noted to have loud snoring, as well as an abnormal breathing pattern during sleep.  She also feels that her throat closes down on occasions when she lies down.  The patient has frequent awakenings during the night, and is unrested at least 50% of the mornings.  She does not sleepiness during the day if she sits down to watch television or if she becomes inactive.  The patient has near blindness, and therefore does not drive.  She states that her weight is up about 10 pounds over the last 2 years, and her Epworth score is only 3 today.  Sleep Questionnaire: What time do you typically go to bed?( Between what hours) 8:00 to 10:00 How long does it take you to fall asleep? 30 mins How many times during the night do you wake up? 4 What time do you get out of bed to start your day? 0900 Do you drive or operate heavy machinery in your occupation? No How much has your weight changed (up or down) over the past two years? (In pounds) 8 lb (3.629 kg) Have you ever had a sleep study before? No Do you currently use CPAP? No Do you wear oxygen at any time? No    Review of Systems  Constitutional: Positive for unexpected weight change. Negative for fever.  HENT: Negative for ear pain, nosebleeds, congestion, sore throat, rhinorrhea, sneezing, trouble swallowing, dental problem, postnasal drip and sinus pressure.   Eyes: Negative for redness and itching.  Respiratory: Positive for shortness of breath. Negative for cough, chest tightness and wheezing.   Cardiovascular: Positive for leg swelling. Negative for palpitations.  Gastrointestinal:  Negative for nausea and vomiting.  Genitourinary: Negative for dysuria.  Musculoskeletal: Negative for joint swelling.  Skin: Negative for rash.  Neurological: Negative for headaches.  Hematological: Does not bruise/bleed easily.  Psychiatric/Behavioral: Negative for dysphoric mood. The patient is not nervous/anxious.        Objective:   Physical Exam Constitutional:  Obese female, no acute distress  HENT:  Nares patent without discharge, but large turbinates.  Oropharynx without exudate, palate and uvula are thick and elongated.  Eyes:  Right eye with lateral deviation, pupils poorly responsive, no scleral icterus  Neck:  No JVD, no TMG  Cardiovascular:  Normal rate, regular rhythm, no rubs or gallops.  No murmurs        Intact distal pulses  Pulmonary :  Normal breath sounds, no stridor or respiratory distress   No rhonchi, or wheezing, mild basilar crackles.   Abdominal:  Soft, nondistended, bowel sounds present.  No tenderness noted.   Musculoskeletal:  2+ lower extremity edema noted.  Lymph Nodes:  No cervical lymphadenopathy noted  Skin:  No cyanosis noted  Neurologic:  Alert, appropriate, moves all 4 extremities without obvious deficit.         Assessment & Plan:

## 2012-04-01 ENCOUNTER — Telehealth: Payer: Self-pay | Admitting: *Deleted

## 2012-04-01 ENCOUNTER — Ambulatory Visit (HOSPITAL_COMMUNITY)
Admission: RE | Admit: 2012-04-01 | Discharge: 2012-04-01 | Disposition: A | Discharge status: Home / Self Care | Payer: Medicare Other | Source: Ambulatory Visit | Attending: Internal Medicine | Admitting: Internal Medicine

## 2012-04-01 VITALS — BP 104/66 | HR 81 | Wt 264.5 lb

## 2012-04-01 DIAGNOSIS — I5032 Chronic diastolic (congestive) heart failure: Secondary | ICD-10-CM

## 2012-04-01 DIAGNOSIS — I509 Heart failure, unspecified: Secondary | ICD-10-CM

## 2012-04-01 NOTE — Assessment & Plan Note (Addendum)
Have discussed role of HF clinic in full with the patient and her granddaughter.  At this time her fluid status looks good.  Will continue current medications.  Had lengthy discussion regarding low sodium diet (as she does not cook for herself), fluid restrictions and medication compliance.  Encouraged her to continue weighing daily and explained use of sliding scale lasix, she will take an extra 20 mg lasix if weight is 260 or above.  She is going to discuss getting a talking scale from her insurance company again as she has to rely on her cousin to weigh her at this time.  Will follow up in 1 month to reassess.    Patient seen and examined with Ulyess Blossom, PA-C. We discussed all aspects of the encounter. I agree with the assessment and plan as stated above.  She is currently well compensated s/p recent hospitlaztion. She has significant challenges with her care but overall seems to be managing. We discussed the use of sliding scale lasix at length and also discussed challenges with getting her meds right. We will work with her to try and get a talking scale to make her more independent with this. We will follow her in HF clinic until these issues are ironed out.

## 2012-04-01 NOTE — Telephone Encounter (Signed)
[facility] assist program needs order from doctor for bath aide 3 x per week ( assist with bathing ) Staff [facility] can call Marylou Mccoy [phone]. Stanton Kidney Heavenlee Maiorana RN 04/01/12 3:50PM

## 2012-04-01 NOTE — Progress Notes (Addendum)
Referring Physician: Dr. [name] Primary Care: Dr. Dorise Hiss Primary Cardiologist: Dr. [name]   Weight Range: 255-259 Baseline proBNP: 144  HPI: [name] is a 76 y.o. African American female with history of nonischemic cardiomyopathy, EF 30-35% 2009 with repeat EF 50-55% 01/2012, minimal nonobstructive coronary artery disease, hypertension, hyperlipidemia, diabetes, diabetic retinopathy with residual blindness in left eye and loss of most sight in right.  Recently evaluated by Dr. Shelle Iron for sleep apnea, sleep study scheduled 04/07/12  LHC 05/25/06: No renal artery stenosis, proximal LAD 40%, proximal D1 40%  Recently admitted for acute on chronic diastolic dysfunction.  Diuresed with IV lasix. Labs 01/19/12: Potassium 4.5, BUN 22, creatinine 1.30, BNP 860, hemoglobin 12.9. Chest x-ray at that time was negative for edema.   Last echo 2/13: - Left ventricle: The cavity size was mildly dilated. Systolic function was normal. The estimated ejection fraction was in the range of 50% to 55%. Wall motion was normal; there were no regional wall motion abnormalities. - Atrial septum: No defect or patent foramen ovale was identified. - RV normal  She has been referred by Dr. [name] for follow up.  She feels pretty good today.  Her dyspnea has improved.  No orthopnea/PND.  No edema now.  No chest pain.  No dizziness or syncope.  She lives with son and cousin.  Uses walker around home but is blind left eye, Rt eye not good.  She is weighing daily, running 255-259. Compliant with meds.   Review of Systems: [y] = yes, [ ]  = no   General: Weight gain [ ] ; Weight loss Cove.Etienne ]; Anorexia [ ] ; Fatigue [ ] ; Fever [ ] ; Chills [ ] ; Weakness [ ]   Cardiac: Chest pain/pressure [ ] ; Resting SOB [ ] ; Exertional SOB [ ] ; Orthopnea [ ] ; Pedal Edema [ ] ; Palpitations [ ] ; Syncope [ ] ; Presyncope [ ] ; Paroxysmal nocturnal dyspnea[ ]   Pulmonary: Cough [ ] ; Wheezing[ ] ; Hemoptysis[ ] ; Sputum [ ] ; Snoring [ ]   GI:  Vomiting[ ] ; Dysphagia[ ] ; Melena[ ] ; Hematochezia [ ] ; Heartburn[ ] ; Abdominal pain [ ] ; Constipation [ ] ; Diarrhea [ ] ; BRBPR [ ]   GU: Hematuria[ ] ; Dysuria [ ] ; Nocturia[ ]   Vascular: Pain in legs with walking [ ] ; Pain in feet with lying flat [ ] ; Non-healing sores [ ] ; Stroke [ ] ; TIA [ ] ; Slurred speech [ ] ;  Neuro: Headaches[ ] ; Vertigo[ ] ; Seizures[ ] ; Paresthesias[ ] ;Blurred vision [ ] ; Diplopia [ ] ; Vision changes [ ]   Ortho/Skin: Arthritis [ ] ; Joint pain [ ] ; Muscle pain [ ] ; Joint swelling [ ] ; Back Pain [ ] ; Rash [ ]   Psych: Depression[ ] ; Anxiety[ ]   Heme: Bleeding problems [ ] ; Clotting disorders [ ] ; Anemia [ ]   Endocrine: Diabetes Cove.Etienne ]; Thyroid dysfunction[ ]    Past Medical History  Diagnosis Date  . Coronary artery disease     LHC 05/25/06: No renal artery stenosis, proximal LAD 40%, proximal D1 40%  . Diabetes mellitus     a1c 7.3 in 10/2011  . Hypertension   . Chronic systolic heart failure     Echocardiogram 10/09: EF 30-35%, mild LVH, increased LV filling pressures, mild LAE  . Diabetic retinopathy   . NICM (nonischemic cardiomyopathy)   . Blood transfusion without reported diagnosis   . Anxiety   . Arthritis     Current Outpatient Prescriptions  Medication Sig Dispense Refill  . ALPRAZolam (XANAX) 0.5 MG tablet Take 0.5 mg by mouth at bedtime as needed. For  sleep or anxiety      . amLODipine (NORVASC) 10 MG tablet Take 10 mg by mouth daily.        Marland Kitchen aspirin EC 81 MG tablet Take 81 mg by mouth daily.        . carvedilol (COREG) 25 MG tablet Take 25 mg by mouth 2 (two) times daily with a meal.      . CRESTOR 10 MG tablet Take 10 mg by mouth daily.       . furosemide (LASIX) 20 MG tablet Take 1 tablet (20 mg total) by mouth 2 (two) times daily.  30 tablet  5  . glipiZIDE (GLUCOTROL XL) 10 MG 24 hr tablet Take 10 mg by mouth daily.      . metFORMIN (GLUCOPHAGE) 500 MG tablet Take 1 tablet (500 mg total) by mouth 2 (two) times daily with a meal.  60 tablet  6  .  pantoprazole (PROTONIX) 40 MG tablet Take 1 tablet (40 mg total) by mouth daily at 12 noon.  30 tablet  3  . simvastatin (ZOCOR) 20 MG tablet Take 1 tablet (20 mg total) by mouth at bedtime.  30 tablet  6  . amLODipine (NORVASC) 10 MG tablet Take 1 tablet (10 mg total) by mouth daily.  30 tablet  11  . brimonidine (ALPHAGAN) 0.15 % ophthalmic solution Place 1 drop into both eyes as directed.  15 mL  0  . furosemide (LASIX) 20 MG tablet Take 1 tablet (20 mg total) by mouth daily. Increase based on weight by one tablet daily as directed.  60 tablet  11  . haloperidol (HALDOL) 2 MG tablet Take 1 tablet (2 mg total) by mouth 2 (two) times daily.  60 tablet  1  . losartan (COZAAR) 25 MG tablet Take 1 tablet (25 mg total) by mouth daily.  30 tablet  11  . metFORMIN (GLUCOPHAGE) 500 MG tablet Take 1 tablet (500 mg total) by mouth 2 (two) times daily.  180 tablet  0    Allergies  Allergen Reactions  . Lisinopril     REACTION: cough    History   Social History  . Marital Status: Widowed    Spouse Name: N/A    Number of Children: 5  . Years of Education: RN school   Occupational History  . Retired     formerly worked as a Chief Strategy Officer   Social History Main Topics  . Smoking status: Never Smoker   . Smokeless tobacco: Not on file   Comment: does have second hand smoke exposure  . Alcohol Use: No  . Drug Use: No  . Sexually Active: Not on file   Other Topics Concern  . Not on file   Social History Narrative   Lives with son who helps with her medications.    Family History  Problem Relation Age of Onset  . Heart disease Maternal Grandmother   . Prostate cancer Father   . Colon cancer Maternal Aunt     PHYSICAL EXAM: Filed Vitals:   04/01/12 1058  BP: 104/66  Pulse: 81  Weight: 264 lb 8 oz (119.976 kg)  SpO2: 91%    General:  Obese, sitting in WC.  Legally blind.  No respiratory difficulty HEENT: normal except for blindness.  Neck: supple. no JVD. Carotids 2+ bilat;  no bruits. No lymphadenopathy or thryomegaly appreciated. Cor: PMI nondisplaced. Distant regular rate & rhythm. Soft TR. Lungs: clear Abdomen: soft, nontender, nondistended. No hepatosplenomegaly. No bruits or masses.  Good bowel sounds. Extremities: no cyanosis, clubbing, rash, edema Neuro: alert & oriented x 3, cranial nerves grossly intact. moves all 4 extremities w/o difficulty. Affect pleasant.   ASSESSMENT & PLAN:

## 2012-04-01 NOTE — Patient Instructions (Signed)
Continue current medications.  Follow up 1 month.  Keep weight between 255-259 pounds.  Take extra fluid pill if weight goes above 260 pounds.  Do the following things EVERYDAY: 1) Weigh yourself in the morning before breakfast. Write it down and keep it in a log. 2) Take your medicines as prescribed 3) Eat low salt foods--Limit salt (sodium) to 2000mg  per day.  4) Stay as active as you can everyday 5) Keep fluid all day less than 2 liters.

## 2012-04-03 NOTE — Telephone Encounter (Signed)
Please call in for that then. MD order approved.  Thanks,   Dr. Dorise Hiss

## 2012-04-07 ENCOUNTER — Ambulatory Visit (HOSPITAL_BASED_OUTPATIENT_CLINIC_OR_DEPARTMENT_OTHER): Payer: Medicare Other | Attending: Pulmonary Disease

## 2012-04-07 VITALS — Ht 64.0 in | Wt 258.0 lb

## 2012-04-07 DIAGNOSIS — G4733 Obstructive sleep apnea (adult) (pediatric): Secondary | ICD-10-CM | POA: Insufficient documentation

## 2012-04-08 ENCOUNTER — Telehealth: Payer: Self-pay | Admitting: *Deleted

## 2012-04-08 ENCOUNTER — Ambulatory Visit (INDEPENDENT_AMBULATORY_CARE_PROVIDER_SITE_OTHER): Payer: Medicare Other | Admitting: Internal Medicine

## 2012-04-08 ENCOUNTER — Encounter: Payer: Self-pay | Admitting: Internal Medicine

## 2012-04-08 VITALS — BP 127/68 | HR 68 | Temp 97.4°F | Ht 64.0 in | Wt 264.8 lb

## 2012-04-08 DIAGNOSIS — F419 Anxiety disorder, unspecified: Secondary | ICD-10-CM

## 2012-04-08 DIAGNOSIS — E785 Hyperlipidemia, unspecified: Secondary | ICD-10-CM

## 2012-04-08 DIAGNOSIS — I2510 Atherosclerotic heart disease of native coronary artery without angina pectoris: Secondary | ICD-10-CM

## 2012-04-08 DIAGNOSIS — E119 Type 2 diabetes mellitus without complications: Secondary | ICD-10-CM

## 2012-04-08 DIAGNOSIS — H539 Unspecified visual disturbance: Secondary | ICD-10-CM

## 2012-04-08 DIAGNOSIS — I10 Essential (primary) hypertension: Secondary | ICD-10-CM

## 2012-04-08 MED ORDER — AMLODIPINE BESYLATE 10 MG PO TABS: 10.0000 mg | ORAL_TABLET | Freq: Every day | ORAL | Status: DC

## 2012-04-08 MED ORDER — LOSARTAN POTASSIUM 25 MG PO TABS: 25.0000 mg | ORAL_TABLET | Freq: Every day | ORAL | Status: DC

## 2012-04-08 MED ORDER — CARVEDILOL 25 MG PO TABS: 25.0000 mg | ORAL_TABLET | Freq: Two times a day (BID) | ORAL | Status: DC

## 2012-04-08 MED ORDER — HALOPERIDOL 2 MG PO TABS: 2.0000 mg | ORAL_TABLET | Freq: Two times a day (BID) | ORAL | Status: DC

## 2012-04-08 MED ORDER — ALPRAZOLAM 0.5 MG PO TABS: 0.5000 mg | ORAL_TABLET | Freq: Every evening | ORAL | Status: DC | PRN

## 2012-04-08 MED ORDER — GLIPIZIDE ER 10 MG PO TB24: 10.0000 mg | ORAL_TABLET | Freq: Every day | ORAL | Status: DC

## 2012-04-08 MED ORDER — ASPIRIN EC 81 MG PO TBEC: 81.0000 mg | DELAYED_RELEASE_TABLET | Freq: Every day | ORAL | Status: DC

## 2012-04-08 MED ORDER — GLUCOSE BLOOD VI STRP: ORAL_STRIP | Status: DC

## 2012-04-08 MED ORDER — BRIMONIDINE TARTRATE 0.15 % OP SOLN: 1.0000 [drp] | OPHTHALMIC | Status: DC

## 2012-04-08 MED ORDER — ROSUVASTATIN CALCIUM 10 MG PO TABS: 10.0000 mg | ORAL_TABLET | Freq: Every day | ORAL | Status: DC

## 2012-04-08 NOTE — Patient Instructions (Signed)
Please, follow up with an eye doctor appointment. Please, fill in your prescriptions and take them as prescribed. Please, call with any questions and follow up in 3 months or sooner if need.

## 2012-04-08 NOTE — Progress Notes (Signed)
Patient ID: [name], female   DOB: [birth year], 76 y.o.   MRN: [id number] HPI:    1. DM, type 2. Patient reports taking her medications without any side-effects. Reports blurry vision in both eyes without any pain, tunnel vision/vision loss or trauma.   Review of Systems: Negative except per history of present illness  Physical Exam:  Nursing notes and vitals reviewed General:  alert, well-developed, and cooperative to examination.   Lungs:  normal respiratory effort, no accessory muscle use, normal breath sounds, no crackles, and no wheezes. Heart:  normal rate, regular rhythm, no murmurs, no gallop, and no rub.   Abdomen:  soft, non-tender, normal bowel sounds, no distention, no guarding, no rebound tenderness, no hepatomegaly, and no splenomegaly.   Extremities:  No cyanosis, clubbing, edema Neurologic:  alert & oriented X3, nonfocal exam  Meds: Medications Prior to Admission  Medication Sig Dispense Refill  . ALPRAZolam (XANAX) 0.5 MG tablet Take 0.5 mg by mouth at bedtime as needed. For sleep or anxiety      . amLODipine (NORVASC) 10 MG tablet Take 10 mg by mouth daily.        Marland Kitchen amLODipine (NORVASC) 10 MG tablet Take 10 mg by mouth daily.      Marland Kitchen aspirin EC 81 MG tablet Take 81 mg by mouth daily.        . brimonidine (ALPHAGAN) 0.15 % ophthalmic solution Place 1 drop into both eyes as directed.      . carvedilol (COREG) 25 MG tablet Take 25 mg by mouth 2 (two) times daily with a meal.      . CRESTOR 10 MG tablet Take 10 mg by mouth daily.       . furosemide (LASIX) 20 MG tablet Take 1 tablet (20 mg total) by mouth 2 (two) times daily.  30 tablet  5  . furosemide (LASIX) 20 MG tablet Take 20 mg by mouth daily. Increase based on weight by one tablet daily as directed.      Marland Kitchen glipiZIDE (GLUCOTROL XL) 10 MG 24 hr tablet Take 10 mg by mouth daily.      . haloperidol (HALDOL) 2 MG tablet Take 2 mg by mouth 2 (two) times daily.      Marland Kitchen losartan (COZAAR) 25 MG tablet Take 25 mg by  mouth daily.      . metFORMIN (GLUCOPHAGE) 500 MG tablet Take 1 tablet (500 mg total) by mouth 2 (two) times daily with a meal.  60 tablet  6  . metFORMIN (GLUCOPHAGE) 500 MG tablet Take 500 mg by mouth 2 (two) times daily.      . pantoprazole (PROTONIX) 40 MG tablet Take 1 tablet (40 mg total) by mouth daily at 12 noon.  30 tablet  3  . simvastatin (ZOCOR) 20 MG tablet Take 1 tablet (20 mg total) by mouth at bedtime.  30 tablet  6  . DISCONTD: amLODipine (NORVASC) 10 MG tablet Take 1 tablet (10 mg total) by mouth daily.  30 tablet  11  . DISCONTD: brimonidine (ALPHAGAN) 0.15 % ophthalmic solution Place 1 drop into both eyes as directed.  15 mL  0  . DISCONTD: furosemide (LASIX) 20 MG tablet Take 1 tablet (20 mg total) by mouth daily. Increase based on weight by one tablet daily as directed.  60 tablet  11  . DISCONTD: haloperidol (HALDOL) 2 MG tablet Take 1 tablet (2 mg total) by mouth 2 (two) times daily.  60 tablet  1  . DISCONTD:  losartan (COZAAR) 25 MG tablet Take 1 tablet (25 mg total) by mouth daily.  30 tablet  11  . DISCONTD: metFORMIN (GLUCOPHAGE) 500 MG tablet Take 1 tablet (500 mg total) by mouth 2 (two) times daily.  180 tablet  0   No current facility-administered medications on file as of 04/08/2012.    Allergies: Lisinopril Past Medical History  Diagnosis Date  . Coronary artery disease     LHC 05/25/06: No renal artery stenosis, proximal LAD 40%, proximal D1 40%  . Diabetes mellitus     a1c 7.3 in 10/2011  . Hypertension   . Chronic systolic heart failure     Echocardiogram 10/09: EF 30-35%, mild LVH, increased LV filling pressures, mild LAE  . Diabetic retinopathy   . NICM (nonischemic cardiomyopathy)   . Blood transfusion without reported diagnosis   . Anxiety   . Arthritis    Past Surgical History  Procedure Date  . Coronary angioplasty with stent placement   . Eye surgery 2012    both eyes   Family History  Problem Relation Age of Onset  . Heart disease  Maternal Grandmother   . Prostate cancer Father   . Colon cancer Maternal Aunt    History   Social History  . Marital Status: Widowed    Spouse Name: N/A    Number of Children: 5  . Years of Education: RN school   Occupational History  . Retired     formerly worked as a Chief Strategy Officer   Social History Main Topics  . Smoking status: Never Smoker   . Smokeless tobacco: Not on file   Comment: does have second hand smoke exposure  . Alcohol Use: No  . Drug Use: No  . Sexually Active: Not on file   Other Topics Concern  . Not on file   Social History Narrative   Lives with son who helps with her medications.    A/P: 1.DM, type 2 -last HgbA1c 7.7 -continue with current regimen -foot care addressed -consider to reconsider use of metformin in the patient with known cardiomyopathy.  2. HLD -continue wit  Crestor -repeat FLP in 6-12 months  3. Visual disturbance, likely due to DM. -referral for opthalmoglogist

## 2012-04-08 NOTE — Telephone Encounter (Signed)
Aware of order from Dr Dorise Hiss 04/03/12 for assist with bath aid 3 x per week. Took verbal order and to follow with paper work. Stanton Kidney Shanna Strength RN 04/08/12 1:50PM

## 2012-04-11 ENCOUNTER — Encounter: Payer: Self-pay | Admitting: Internal Medicine

## 2012-04-22 DIAGNOSIS — G4733 Obstructive sleep apnea (adult) (pediatric): Secondary | ICD-10-CM

## 2012-04-22 NOTE — Procedures (Signed)
NAMESTARLING, [name]             ACCOUNT NO.:  [id number]  MEDICAL RECORD NO.:  [id number]          PATIENT TYPE:  OUT  LOCATION:  SLEEP CENTER                 FACILITY[facility]  [facility]  PHYSICIAN:  Barbaraann Share, MD,FCCPDATE OF BIRTH:  10[birth year]/1938  DATE OF STUDY:  04/07/2012                           NOCTURNAL POLYSOMNOGRAM  REFERRING PHYSICIAN:  Barbaraann Share, MD,FCCP  LOCATION:  Sleep Lab.  REFERRING PHYSICIANS:  Barbaraann Share, MD, FCCP  INDICATION FOR STUDY:  Hypersomnia with sleep apnea.  EPWORTH SLEEPINESS SCORE:  2.  MEDICATIONS:  SLEEP ARCHITECTURE:  The patient had a total sleep time of 301 minutes with no slow-wave sleep and only 28 minutes of REM.  Sleep onset latency was normal at 39 minutes, and REM onset was prolonged at 161 minutes. Sleep efficiency was poor at 69%.  RESPIRATORY DATA:  The patient was found to have 11 apneas and 134 obstructive hypopneas, giving her an apnea-hypopnea index of 29 events per hour.  The events occurred all in the supine position, and there was loud snoring noted throughout.  There were increased numbers of events and severity during REM.  OXYGEN DATA:  There was O2 desaturation as low as 68% with the patient's obstructive events.  CARDIAC DATA:  Occasional PVCs noted, but no clinically significant arrhythmias were seen.  MOVEMENT-PARASOMNIA:  No significant leg jerks or other abnormal behaviors were noted.  IMPRESSIONS-RECOMMENDATION: 1. Moderate obstructive sleep apnea/hypopnea syndrome with an apnea-     hypopnea index of 29 events per hour and O2 desaturation as low as     68%.  Treatment for this degree of sleep apnea can include a trial     of weight loss     alone, upper airway surgery, dental appliance, and also CPAP.     Clinical correlation is suggested. 2. Occasional premature ventricular contraction noted, but no     clinically significant arrhythmias were seen.     Barbaraann Share, MD,FCCP Diplomate,  American Board of Sleep Medicine    [facility]/MEDQ  D:  04/22/2012 08:45:16  T:  04/22/2012 10:28:06  Job:  [id number]

## 2012-04-25 ENCOUNTER — Encounter: Payer: Self-pay | Admitting: Pulmonary Disease

## 2012-04-25 ENCOUNTER — Ambulatory Visit (INDEPENDENT_AMBULATORY_CARE_PROVIDER_SITE_OTHER): Payer: Medicare Other | Admitting: Pulmonary Disease

## 2012-04-25 VITALS — BP 128/78 | HR 83 | Temp 98.8°F | Ht 64.0 in | Wt 266.0 lb

## 2012-04-25 DIAGNOSIS — G4733 Obstructive sleep apnea (adult) (pediatric): Secondary | ICD-10-CM

## 2012-04-25 NOTE — Assessment & Plan Note (Signed)
The patient has moderate obstructive sleep apnea by her recent sleep study, and would be best served by treatment with CPAP while she is working on weight reduction.  She is agreeable to trying the device. I will set the patient up on cpap at a moderate pressure level to allow for desensitization, and will troubleshoot the device over the next 4-6weeks if needed.  The pt is to call me if having issues with tolerance.  Will then optimize the pressure once patient is able to wear cpap on a consistent basis.

## 2012-04-25 NOTE — Progress Notes (Signed)
  Subjective:    Patient ID: [name], female    DOB: 1936/11/25, 76 y.o.   MRN: [id number]  HPI Patient comes in today for followup of her recent sleep study.  She was found to have moderate obstructive sleep apnea with an AHI of 29 events per hour.  I have reviewed the study with her and her family in detail, and answered all of their questions.   Review of Systems  Constitutional: Negative for fever and unexpected weight change.  HENT: Positive for congestion and postnasal drip. Negative for ear pain, nosebleeds, sore throat, rhinorrhea, sneezing, trouble swallowing, dental problem and sinus pressure.   Eyes: Positive for discharge. Negative for redness and itching.  Respiratory: Positive for cough. Negative for chest tightness, shortness of breath and wheezing.   Cardiovascular: Positive for leg swelling. Negative for palpitations.  Gastrointestinal: Negative for nausea and vomiting.  Genitourinary: Negative for dysuria.  Musculoskeletal: Negative for joint swelling.  Skin: Negative for rash.  Neurological: Negative for headaches.  Hematological: Does not bruise/bleed easily.  Psychiatric/Behavioral: Negative for dysphoric mood. The patient is not nervous/anxious.        Objective:   Physical Exam Obese female in no acute distress Nose without purulence or discharge noted Lower extremities with definite edema, no cyanosis Awake, but does appear mildly sleepy, moves all 4 extremities.       Assessment & Plan:

## 2012-04-25 NOTE — Patient Instructions (Signed)
Will start you on cpap at a moderate pressure level.  Please call if having issues with tolerance. Work on weight loss followup with me in 6 weeks.  

## 2012-05-05 ENCOUNTER — Ambulatory Visit (HOSPITAL_COMMUNITY): Payer: Medicare Other

## 2012-05-26 ENCOUNTER — Ambulatory Visit (HOSPITAL_COMMUNITY): Payer: Medicare Other | Attending: Internal Medicine

## 2012-06-06 ENCOUNTER — Ambulatory Visit: Payer: Medicare Other | Admitting: Pulmonary Disease

## 2012-06-07 ENCOUNTER — Ambulatory Visit: Payer: Medicare Other | Admitting: Cardiovascular Disease

## 2012-08-03 ENCOUNTER — Other Ambulatory Visit: Payer: Self-pay | Admitting: *Deleted

## 2012-08-03 DIAGNOSIS — F419 Anxiety disorder, unspecified: Secondary | ICD-10-CM

## 2012-08-03 MED ORDER — ALPRAZOLAM 0.5 MG PO TABS: 0.5000 mg | ORAL_TABLET | Freq: Every evening | ORAL | Status: DC | PRN

## 2012-08-03 NOTE — Telephone Encounter (Signed)
Called to pharm 

## 2012-09-04 ENCOUNTER — Encounter (HOSPITAL_COMMUNITY): Payer: Self-pay | Admitting: Physical Medicine and Rehabilitation

## 2012-09-04 ENCOUNTER — Emergency Department (HOSPITAL_COMMUNITY)
Admission: EM | Admit: 2012-09-04 | Discharge: 2012-09-04 | Disposition: A | Discharge status: Home / Self Care | Payer: Medicare Other | Attending: Emergency Medicine | Admitting: Emergency Medicine

## 2012-09-04 DIAGNOSIS — Z7982 Long term (current) use of aspirin: Secondary | ICD-10-CM | POA: Insufficient documentation

## 2012-09-04 DIAGNOSIS — Z888 Allergy status to other drugs, medicaments and biological substances status: Secondary | ICD-10-CM | POA: Insufficient documentation

## 2012-09-04 DIAGNOSIS — E1140 Type 2 diabetes mellitus with diabetic neuropathy, unspecified: Secondary | ICD-10-CM

## 2012-09-04 DIAGNOSIS — I2510 Atherosclerotic heart disease of native coronary artery without angina pectoris: Secondary | ICD-10-CM | POA: Insufficient documentation

## 2012-09-04 DIAGNOSIS — F411 Generalized anxiety disorder: Secondary | ICD-10-CM | POA: Insufficient documentation

## 2012-09-04 DIAGNOSIS — Z8042 Family history of malignant neoplasm of prostate: Secondary | ICD-10-CM | POA: Insufficient documentation

## 2012-09-04 DIAGNOSIS — Z800 Family history of malignant neoplasm of digestive organs: Secondary | ICD-10-CM | POA: Insufficient documentation

## 2012-09-04 DIAGNOSIS — Z8249 Family history of ischemic heart disease and other diseases of the circulatory system: Secondary | ICD-10-CM | POA: Insufficient documentation

## 2012-09-04 DIAGNOSIS — E1142 Type 2 diabetes mellitus with diabetic polyneuropathy: Secondary | ICD-10-CM | POA: Insufficient documentation

## 2012-09-04 DIAGNOSIS — I509 Heart failure, unspecified: Secondary | ICD-10-CM | POA: Insufficient documentation

## 2012-09-04 DIAGNOSIS — E1149 Type 2 diabetes mellitus with other diabetic neurological complication: Secondary | ICD-10-CM | POA: Insufficient documentation

## 2012-09-04 DIAGNOSIS — I10 Essential (primary) hypertension: Secondary | ICD-10-CM | POA: Insufficient documentation

## 2012-09-04 MED ORDER — PREGABALIN 100 MG PO CAPS: 100.0000 mg | ORAL_CAPSULE | Freq: Two times a day (BID) | ORAL | Status: DC

## 2012-09-04 NOTE — ED Notes (Signed)
PT reports pain onset one week ago LT foot

## 2012-09-04 NOTE — ED Provider Notes (Signed)
History     CSN: [id number]  Arrival date & time 09/04/12  1503   First MD Initiated Contact with Patient 09/04/12 1852      Chief Complaint  Patient presents with  . Foot Pain    (Consider location/radiation/quality/duration/timing/severity/associated sxs/prior treatment) HPI Comments: Patient is a 76 year old female with a past medical history of diabetes, CAD, CHF, and hypertension who presents with left foot pain for the past week. She reports gradual onset of the pain which she describes as sharp and severe. She says the pain has stayed constantly severe. The pain is made worse by weight bearing and palpation of her foot. She denies any alleviating factors. She reports having the same chronic pain in her right foot that spontaneously resolves sometimes. She denies any acute injury. She denies chest pain, SOB, wheezing, numbness/tingling.   Patient is a 76 y.o. female presenting with lower extremity pain.  Foot Pain Associated symptoms include arthralgias.    Past Medical History  Diagnosis Date  . Coronary artery disease     LHC 05/25/06: No renal artery stenosis, proximal LAD 40%, proximal D1 40%  . Diabetes mellitus     a1c 7.3 in 10/2011  . Hypertension   . Chronic systolic heart failure     Echocardiogram 10/09: EF 30-35%, mild LVH, increased LV filling pressures, mild LAE  . Diabetic retinopathy   . NICM (nonischemic cardiomyopathy)   . Blood transfusion without reported diagnosis   . Anxiety   . Arthritis     Past Surgical History  Procedure Date  . Coronary angioplasty with stent placement   . Eye surgery 2012    both eyes    Family History  Problem Relation Age of Onset  . Heart disease Maternal Grandmother   . Prostate cancer Father   . Colon cancer Maternal Aunt     History  Substance Use Topics  . Smoking status: Never Smoker   . Smokeless tobacco: Not on file   Comment: does have second hand smoke exposure  . Alcohol Use: No    OB History      Grav Para Term Preterm Abortions TAB SAB Ect Mult Living                  Review of Systems  Musculoskeletal: Positive for arthralgias.  All other systems reviewed and are negative.    Allergies  Lisinopril  Home Medications   Current Outpatient Rx  Name Route Sig Dispense Refill  . ALPRAZOLAM 0.5 MG PO TABS Oral Take 1 tablet (0.5 mg total) by mouth at bedtime as needed. For sleep or anxiety 30 tablet 5  . AMLODIPINE BESYLATE 10 MG PO TABS Oral Take 1 tablet (10 mg total) by mouth daily. 30 tablet 11  . ASPIRIN EC 81 MG PO TBEC Oral Take 1 tablet (81 mg total) by mouth daily. 30 tablet 11  . BRIMONIDINE TARTRATE 0.2 % OP SOLN Both Eyes Place 1 drop into both eyes daily.    Marland Kitchen CARVEDILOL 25 MG PO TABS Oral Take 1 tablet (25 mg total) by mouth 2 (two) times daily with a meal. 60 tablet 11  . FUROSEMIDE 20 MG PO TABS Oral Take 1 tablet (20 mg total) by mouth 2 (two) times daily. 30 tablet 5  . GLIPIZIDE ER 10 MG PO TB24 Oral Take 1 tablet (10 mg total) by mouth daily. 30 tablet 11  . GLUCOSE BLOOD VI STRP  Use as instructed 100 each 12  . LOSARTAN  POTASSIUM 25 MG PO TABS Oral Take 1 tablet (25 mg total) by mouth daily. 30 tablet 11  . METFORMIN HCL 500 MG PO TABS Oral Take 1 tablet (500 mg total) by mouth 2 (two) times daily with a meal. 60 tablet 6  . ROSUVASTATIN CALCIUM 10 MG PO TABS Oral Take 1 tablet (10 mg total) by mouth daily. 30 tablet 11  . PREGABALIN 100 MG PO CAPS Oral Take 1 capsule (100 mg total) by mouth 2 (two) times daily. 60 capsule 0    BP 108/73  Pulse 95  Temp 98.9 F (37.2 C) (Oral)  Resp 24  SpO2 94%  Physical Exam  Nursing note and vitals reviewed. Constitutional: She is oriented to person, place, and time. She appears well-developed and well-nourished. No distress.  HENT:  Head: Normocephalic and atraumatic.  Eyes:       Patient's eyes remained closed for the duration of the interview.   Neck: Normal range of motion. Neck supple.   Cardiovascular: Normal rate, regular rhythm and intact distal pulses.  Exam reveals no gallop and no friction rub.   No murmur heard. Pulmonary/Chest: Breath sounds normal. No respiratory distress. She has no wheezes. She has no rales. She exhibits no tenderness.  Abdominal: Soft. There is no tenderness.  Musculoskeletal: Normal range of motion.       Left foot extremely tender to palpation and edematous, most notably on the dorsum of her foot. Right foot mildly tender to palpation and edematous, noted on the dorsum of her foot but less extensive than the left.   Neurological: She is alert and oriented to person, place, and time.  Skin: Skin is warm and dry. No rash noted. She is not diaphoretic.  Psychiatric: She has a normal mood and affect. Her behavior is normal.    ED Course  Procedures (including critical care time)  Labs Reviewed  GLUCOSE, CAPILLARY - Abnormal; Notable for the following:    Glucose-Capillary 177 (*)     All other components within normal limits   No results found.   1. Diabetic neuropathy       MDM  Patient's L foot pain likely caused by diabetic neuropathy, as she endorsed the same pain in her R foot as well that resolves spontaneously on occasion. No imaging was done due to no acute injury of her foot. No bruising or abnormal coloring noted. Edema present but is bilateral and non pitting. Patient advised to follow up with her doctor and will be discharged with Lyrica.         Emilia Beck, PA-C 09/04/12 2258

## 2012-09-05 NOTE — ED Provider Notes (Signed)
Medical screening examination/treatment/procedure(s) were performed by non-physician practitioner and as supervising physician I was immediately available for consultation/collaboration.  Ethelda Chick, MD 09/05/12 [phone]

## 2012-09-06 ENCOUNTER — Encounter (HOSPITAL_COMMUNITY): Payer: Self-pay | Admitting: *Deleted

## 2012-09-06 ENCOUNTER — Emergency Department (HOSPITAL_COMMUNITY): Payer: Medicare Other

## 2012-09-06 ENCOUNTER — Inpatient Hospital Stay (HOSPITAL_COMMUNITY)
Admission: EM | Admit: 2012-09-06 | Discharge: 2012-09-13 | DRG: 314 | Disposition: A | Discharge status: Skilled Nursing Facility | Payer: Medicare Other | Attending: Internal Medicine | Admitting: Internal Medicine

## 2012-09-06 DIAGNOSIS — E11319 Type 2 diabetes mellitus with unspecified diabetic retinopathy without macular edema: Secondary | ICD-10-CM | POA: Diagnosis present

## 2012-09-06 DIAGNOSIS — F411 Generalized anxiety disorder: Secondary | ICD-10-CM | POA: Diagnosis present

## 2012-09-06 DIAGNOSIS — N19 Unspecified kidney failure: Secondary | ICD-10-CM

## 2012-09-06 DIAGNOSIS — I129 Hypertensive chronic kidney disease with stage 1 through stage 4 chronic kidney disease, or unspecified chronic kidney disease: Secondary | ICD-10-CM | POA: Diagnosis present

## 2012-09-06 DIAGNOSIS — N390 Urinary tract infection, site not specified: Secondary | ICD-10-CM | POA: Diagnosis present

## 2012-09-06 DIAGNOSIS — D649 Anemia, unspecified: Secondary | ICD-10-CM | POA: Diagnosis present

## 2012-09-06 DIAGNOSIS — I5022 Chronic systolic (congestive) heart failure: Secondary | ICD-10-CM | POA: Diagnosis present

## 2012-09-06 DIAGNOSIS — I428 Other cardiomyopathies: Secondary | ICD-10-CM | POA: Diagnosis present

## 2012-09-06 DIAGNOSIS — R4182 Altered mental status, unspecified: Secondary | ICD-10-CM

## 2012-09-06 DIAGNOSIS — I959 Hypotension, unspecified: Principal | ICD-10-CM | POA: Diagnosis present

## 2012-09-06 DIAGNOSIS — E1139 Type 2 diabetes mellitus with other diabetic ophthalmic complication: Secondary | ICD-10-CM | POA: Diagnosis present

## 2012-09-06 DIAGNOSIS — E860 Dehydration: Secondary | ICD-10-CM | POA: Diagnosis present

## 2012-09-06 DIAGNOSIS — I2510 Atherosclerotic heart disease of native coronary artery without angina pectoris: Secondary | ICD-10-CM | POA: Diagnosis present

## 2012-09-06 DIAGNOSIS — I5042 Chronic combined systolic (congestive) and diastolic (congestive) heart failure: Secondary | ICD-10-CM

## 2012-09-06 DIAGNOSIS — R5381 Other malaise: Secondary | ICD-10-CM | POA: Diagnosis present

## 2012-09-06 DIAGNOSIS — I509 Heart failure, unspecified: Secondary | ICD-10-CM | POA: Diagnosis present

## 2012-09-06 DIAGNOSIS — E785 Hyperlipidemia, unspecified: Secondary | ICD-10-CM | POA: Diagnosis present

## 2012-09-06 DIAGNOSIS — I5032 Chronic diastolic (congestive) heart failure: Secondary | ICD-10-CM

## 2012-09-06 DIAGNOSIS — N179 Acute kidney failure, unspecified: Secondary | ICD-10-CM | POA: Diagnosis present

## 2012-09-06 DIAGNOSIS — G4733 Obstructive sleep apnea (adult) (pediatric): Secondary | ICD-10-CM | POA: Diagnosis present

## 2012-09-06 DIAGNOSIS — N189 Chronic kidney disease, unspecified: Secondary | ICD-10-CM

## 2012-09-06 DIAGNOSIS — N183 Chronic kidney disease, stage 3 unspecified: Secondary | ICD-10-CM | POA: Diagnosis present

## 2012-09-06 DIAGNOSIS — I429 Cardiomyopathy, unspecified: Secondary | ICD-10-CM

## 2012-09-06 DIAGNOSIS — H409 Unspecified glaucoma: Secondary | ICD-10-CM | POA: Diagnosis present

## 2012-09-06 DIAGNOSIS — I10 Essential (primary) hypertension: Secondary | ICD-10-CM | POA: Diagnosis present

## 2012-09-06 DIAGNOSIS — E113599 Type 2 diabetes mellitus with proliferative diabetic retinopathy without macular edema, unspecified eye: Secondary | ICD-10-CM | POA: Diagnosis present

## 2012-09-06 DIAGNOSIS — K5900 Constipation, unspecified: Secondary | ICD-10-CM | POA: Diagnosis present

## 2012-09-06 DIAGNOSIS — G9341 Metabolic encephalopathy: Secondary | ICD-10-CM | POA: Diagnosis present

## 2012-09-06 DIAGNOSIS — R579 Shock, unspecified: Secondary | ICD-10-CM

## 2012-09-06 DIAGNOSIS — G4700 Insomnia, unspecified: Secondary | ICD-10-CM | POA: Diagnosis present

## 2012-09-06 DIAGNOSIS — R441 Visual hallucinations: Secondary | ICD-10-CM | POA: Diagnosis present

## 2012-09-06 DIAGNOSIS — E119 Type 2 diabetes mellitus without complications: Secondary | ICD-10-CM

## 2012-09-06 LAB — COMPREHENSIVE METABOLIC PANEL
ALT: 16 U/L (ref 0–35)
Albumin: 2.5 g/dL — ABNORMAL LOW (ref 3.5–5.2)
Alkaline Phosphatase: 94 U/L (ref 39–117)
BUN: 41 mg/dL — ABNORMAL HIGH (ref 6–23)
Chloride: 98 mEq/L (ref 96–112)
GFR calc Af Amer: 22 mL/min — ABNORMAL LOW (ref >90)
Glucose, Bld: 68 mg/dL — ABNORMAL LOW (ref 70–99)
Potassium: 4.5 mEq/L (ref 3.5–5.1)
Sodium: 134 mEq/L — ABNORMAL LOW (ref 135–145)
Total Bilirubin: 1.1 mg/dL (ref 0.3–1.2)

## 2012-09-06 LAB — CBC
HCT: 33.7 % — ABNORMAL LOW (ref 36.0–46.0)
Hemoglobin: 11 g/dL — ABNORMAL LOW (ref 12.0–15.0)
WBC: 11 10*3/uL — ABNORMAL HIGH (ref 4.0–10.5)

## 2012-09-06 LAB — POCT I-STAT 3, ART BLOOD GAS (G3+)
Acid-base deficit: 3 mmol/L — ABNORMAL HIGH (ref 0.0–2.0)
Bicarbonate: 24.2 mEq/L — ABNORMAL HIGH (ref 20.0–24.0)
O2 Saturation: 95 %
TCO2: 26 mmol/L (ref 0–100)
pCO2 arterial: 56.4 mmHg — ABNORMAL HIGH (ref 35.0–45.0)
pO2, Arterial: 95 mmHg (ref 80.0–100.0)

## 2012-09-06 LAB — GLUCOSE, CAPILLARY
Glucose-Capillary: 64 mg/dL — ABNORMAL LOW (ref 70–99)
Glucose-Capillary: 137 mg/dL — ABNORMAL HIGH (ref 70–99)
Glucose-Capillary: 71 mg/dL (ref 70–99)
Glucose-Capillary: 152 mg/dL — ABNORMAL HIGH (ref 70–99)
Glucose-Capillary: 154 mg/dL — ABNORMAL HIGH (ref 70–99)

## 2012-09-06 LAB — URINALYSIS, MICROSCOPIC ONLY
Glucose, UA: NEGATIVE mg/dL
Hgb urine dipstick: NEGATIVE
Protein, ur: NEGATIVE mg/dL
pH: 5 (ref 5.0–8.0)

## 2012-09-06 LAB — LACTIC ACID, PLASMA
Lactic Acid, Venous: 0.5 mmol/L (ref 0.5–2.2)
Lactic Acid, Venous: 0.7 mmol/L (ref 0.5–2.2)

## 2012-09-06 LAB — POCT I-STAT 3, VENOUS BLOOD GAS (G3P V)
O2 Saturation: 82 %
TCO2: 28 mmol/L (ref 0–100)
pCO2, Ven: 49.6 mmHg (ref 45.0–50.0)

## 2012-09-06 LAB — PROTIME-INR: Prothrombin Time: 15.8 seconds — ABNORMAL HIGH (ref 11.6–15.2)

## 2012-09-06 LAB — MRSA PCR SCREENING: MRSA by PCR: NEGATIVE

## 2012-09-06 MED ORDER — SODIUM CHLORIDE 0.9 % IV BOLUS (SEPSIS)
2000.0000 mL | Freq: Once | INTRAVENOUS | Status: AC
  Administered 2012-09-06: 2000 mL via INTRAVENOUS

## 2012-09-06 MED ORDER — PANTOPRAZOLE SODIUM 40 MG IV SOLR
40.0000 mg | INTRAVENOUS | Status: DC
  Administered 2012-09-06 – 2012-09-07 (×2): 40 mg via INTRAVENOUS
  Filled 2012-09-06 (×3): qty 40

## 2012-09-06 MED ORDER — SODIUM CHLORIDE 0.9 % IV BOLUS (SEPSIS)
1000.0000 mL | Freq: Once | INTRAVENOUS | Status: AC
  Administered 2012-09-06: 1000 mL via INTRAVENOUS

## 2012-09-06 MED ORDER — ALPRAZOLAM 0.25 MG PO TABS
0.2500 mg | ORAL_TABLET | Freq: Four times a day (QID) | ORAL | Status: DC | PRN
  Administered 2012-09-06 – 2012-09-11 (×4): 0.25 mg via ORAL
  Filled 2012-09-06 (×4): qty 1

## 2012-09-06 MED ORDER — DEXTROSE 50 % IV SOLN
25.0000 mL | Freq: Once | INTRAVENOUS | Status: AC
  Administered 2012-09-06: 25 mL via INTRAVENOUS
  Filled 2012-09-06: qty 50

## 2012-09-06 MED ORDER — INSULIN ASPART 100 UNIT/ML ~~LOC~~ SOLN
0.0000 [IU] | SUBCUTANEOUS | Status: DC
  Administered 2012-09-06 – 2012-09-08 (×4): 2 [IU] via SUBCUTANEOUS
  Administered 2012-09-08: 3 [IU] via SUBCUTANEOUS
  Administered 2012-09-08: 2 [IU] via SUBCUTANEOUS

## 2012-09-06 MED ORDER — INFLUENZA VIRUS VACC SPLIT PF IM SUSP
0.5000 mL | INTRAMUSCULAR | Status: AC
  Administered 2012-09-07: 0.5 mL via INTRAMUSCULAR
  Filled 2012-09-06: qty 0.5

## 2012-09-06 MED ORDER — SODIUM CHLORIDE 0.9 % IV SOLN
INTRAVENOUS | Status: DC
  Administered 2012-09-06 – 2012-09-08 (×3): via INTRAVENOUS

## 2012-09-06 MED ORDER — BIOTENE DRY MOUTH MT LIQD
15.0000 mL | Freq: Two times a day (BID) | OROMUCOSAL | Status: DC
  Administered 2012-09-06 – 2012-09-13 (×13): 15 mL via OROMUCOSAL

## 2012-09-06 MED ORDER — NOREPINEPHRINE BITARTRATE 1 MG/ML IJ SOLN
2.0000 ug/min | INTRAVENOUS | Status: DC
  Administered 2012-09-06: 10 ug/min via INTRAVENOUS
  Administered 2012-09-06: 5 ug/min via INTRAVENOUS
  Filled 2012-09-06 (×2): qty 16

## 2012-09-06 MED ORDER — BRIMONIDINE TARTRATE 0.2 % OP SOLN
1.0000 [drp] | Freq: Every day | OPHTHALMIC | Status: DC
  Administered 2012-09-06 – 2012-09-11 (×3): 1 [drp] via OPHTHALMIC
  Filled 2012-09-06: qty 5

## 2012-09-06 MED ORDER — HEPARIN SODIUM (PORCINE) 5000 UNIT/ML IJ SOLN
5000.0000 [IU] | Freq: Three times a day (TID) | INTRAMUSCULAR | Status: DC
  Administered 2012-09-06 – 2012-09-13 (×21): 5000 [IU] via SUBCUTANEOUS
  Filled 2012-09-06 (×24): qty 1

## 2012-09-06 MED ORDER — DEXTROSE 5 % IV SOLN
2.0000 g | INTRAVENOUS | Status: DC
  Administered 2012-09-06 – 2012-09-13 (×8): 2 g via INTRAVENOUS
  Filled 2012-09-06 (×8): qty 2

## 2012-09-06 MED ORDER — CHLORHEXIDINE GLUCONATE 0.12 % MT SOLN
15.0000 mL | Freq: Two times a day (BID) | OROMUCOSAL | Status: DC
  Administered 2012-09-06 – 2012-09-07 (×3): 15 mL via OROMUCOSAL
  Filled 2012-09-06 (×5): qty 15

## 2012-09-06 NOTE — Care Management Note (Signed)
    Page 1 of 1   09/06/2012     3:55:17 PM   CARE MANAGEMENT NOTE 09/06/2012  Patient:  [name], [name]   Account Number:  [id number]  Date Initiated:  09/06/2012  Documentation initiated by:  Avie Arenas  Subjective/Objective Assessment:   Admitted with hypotension.  Lives with children.     Action/Plan:   Anticipated DC Date:  09/12/2012   Anticipated DC Plan:  HOME W HOME HEALTH SERVICES      DC Planning Services  CM consult      Choice offered to / List presented to:             Status of service:  In process, will continue to follow Medicare Important Message given?   (If response is "NO", the following Medicare IM given date fields will be blank) Date Medicare IM given:   Date Additional Medicare IM given:    Discharge Disposition:    Per UR Regulation:  Reviewed for med. necessity/level of care/duration of stay  If discussed at Long Length of Stay Meetings, dates discussed:    Comments:  Contact:  Hill,Lazelle Son [phone]                 Lenon Ahmadi [phone]                 Moumouni,[name] Daughter [phone]  09-06-12 4pm Avie Arenas, [place] [phone] Talked with Ephraim Hamburger - has been living with him, but has just moved her in with his sister, [name].  Has RW - uses when goes out, has Shower seat and BSC at home.  Case Management will continue to follow for further needs.
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
## 2012-09-06 NOTE — ED Notes (Signed)
Dtr called and Dr. Lavella Lemons spoke with dtr and updated re: pt status.

## 2012-09-06 NOTE — ED Notes (Signed)
BP decreased to 40 -50s. Dr. Lavella Lemons updated and orders for 2000 ml NS bolus received.

## 2012-09-06 NOTE — ED Notes (Addendum)
Pt responding to son when he calls her name. BP improved with IVFs and Levophed drip.

## 2012-09-06 NOTE — ED Notes (Addendum)
Right Arterial Line  placed per RT.

## 2012-09-06 NOTE — H&P (Signed)
Name: [name] MRN: [id number] DOB: [birth year]    LOS: 0  Referring Provider:  EDP Reason for Referral:  Hypotension / acute encephalopathy  PULMONARY / CRITICAL CARE MEDICINE  The patient is encephalopathic and unable to provide history, which was obtained for available medical records.  HPI:  76 yo with OSA recently started on Lyrica brought to [facility] ED with altered mental status, hypotensive.  Past Medical History  Diagnosis Date  . Coronary artery disease     LHC 05/25/06: No renal artery stenosis, proximal LAD 40%, proximal D1 40%  . Diabetes mellitus     a1c 7.3 in 10/2011  . Hypertension   . Chronic systolic heart failure     Echocardiogram 10/09: EF 30-35%, mild LVH, increased LV filling pressures, mild LAE  . Diabetic retinopathy   . NICM (nonischemic cardiomyopathy)   . Blood transfusion without reported diagnosis   . Anxiety   . Arthritis    Past Surgical History  Procedure Date  . Coronary angioplasty with stent placement   . Eye surgery 2012    both eyes   Prior to Admission medications   Medication Sig Start Date End Date Taking? Authorizing Provider  ALPRAZolam Prudy Feeler) 0.5 MG tablet Take 1 tablet (0.5 mg total) by mouth at bedtime as needed. For sleep or anxiety 08/03/12 08/03/16  Judie Bonus, MD  amLODipine (NORVASC) 10 MG tablet Take 1 tablet (10 mg total) by mouth daily. 04/08/12   Denna Haggard, MD  aspirin EC 81 MG tablet Take 1 tablet (81 mg total) by mouth daily. 04/08/12   Denna Haggard, MD  brimonidine (ALPHAGAN) 0.2 % ophthalmic solution Place 1 drop into both eyes daily.    Historical Provider, MD  carvedilol (COREG) 25 MG tablet Take 1 tablet (25 mg total) by mouth 2 (two) times daily with a meal. 04/08/12   Denna Haggard, MD  furosemide (LASIX) 20 MG tablet Take 1 tablet (20 mg total) by mouth 2 (two) times daily. 02/29/12 02/28/13  Neema Davina Poke, MD  glipiZIDE (GLUCOTROL XL) 10 MG 24 hr tablet Take 1 tablet (10 mg total) by  mouth daily. 04/08/12   Denna Haggard, MD  glucose blood (ULTIMA TEST) test strip Use as instructed 04/08/12 04/08/13  Denna Haggard, MD  losartan (COZAAR) 25 MG tablet Take 1 tablet (25 mg total) by mouth daily. 04/08/12 04/23/22  Denna Haggard, MD  metFORMIN (GLUCOPHAGE) 500 MG tablet Take 1 tablet (500 mg total) by mouth 2 (two) times daily with a meal. 03/01/12   Judie Bonus, MD  pregabalin (LYRICA) 100 MG capsule Take 1 capsule (100 mg total) by mouth 2 (two) times daily. 09/04/12 09/04/13  Emilia Beck, PA-C  rosuvastatin (CRESTOR) 10 MG tablet Take 1 tablet (10 mg total) by mouth daily. 04/08/12   Denna Haggard, MD   Allergies Allergies  Allergen Reactions  . Lisinopril     REACTION: cough   Family History Family History  Problem Relation Age of Onset  . Heart disease Maternal Grandmother   . Prostate cancer Father   . Colon cancer Maternal Aunt    Social History  reports that she has never smoked. She does not have any smokeless tobacco history on file. She reports that she does not drink alcohol or use illicit drugs.  Review Of Systems:  Unable to provide  Brief patient description:  76 yo recently started on Lyrica brought to [facility] ED with altered mental status, hypotensive.  Events Since Admission: 9/17 >>> Brought to [facility] ED with altered mental status, hypotensive.  Current Status:  Vital Signs: Pulse Rate:  [77-85] 77  (09/17 0530) Resp:  [12-23] 13  (09/17 0530) BP: (43-110)/(18-90) 75/19 mmHg (09/17 0530) SpO2:  [92 %-98 %] 97 % (09/17 0530)  Physical Examination: General:  No distress, obese Neuro:  Somnolent but arouses to deep stimulation, decreased cough and gag HEENT:  Sluggish pupils, dry mucous membranes Neck:  Cannot assess JVD Cardiovascular:  RRR, no m/r/g Lungs:  Bilateral diminished air entry, no w/r/r Abdomen:  Obese, soft, nontender, nondistended, bowel sounds present Musculoskeletal:  Bilateral pitting pedal edema Skin:  No  rash  Active Problems:  DIABETES MELLITUS, TYPE II  HYPERLIPIDEMIA  HYPERTENSION  CORONARY ARTERY DISEASE  CARDIOMYOPATHY, SECONDARY  RENAL INSUFFICIENCY, CHRONIC  OSA (obstructive sleep apnea)  ASSESSMENT AND PLAN  PULMONARY  Lab 09/06/12 0427  PHART --  PCO2ART --  PO2ART --  HCO3 26.6*  O2SAT 82.0   Ventilator Settings:    CXR:  9/17 >>> Small volumes, cardiomegaly, no overt infiltrates / edema ETT:  NA  A:  History of OSA.  Possible OHS.  Protects airway at this time. P:   Goal SpO2 > 92 Supplemental oxygen ABG  CARDIOVASCULAR  Lab 09/06/12 0445 09/06/12 0412  TROPONINI -- <0.30  LATICACIDVEN 0.9 --  PROBNP -- --   ECG:  Pending Lines: R rad A-line 9/17 >>>  A: Hypotension in setting of severe dehydration, multiple antihypertensives and diuretics. P:  Goal MAP > 60 Levophed gtt May need CVL Trend lactic acid Trend troponin 12-lead ECG Hold preadmission Norvasc, Cozaar, Crestor, ASA  RENAL  Lab 09/06/12 0411  NA 134*  K 4.5  CL 98  CO2 25  BUN 41*  CREATININE 2.40*  CALCIUM 8.8  MG --  PHOS --   Intake/Output    None    Foley:  9/17 >>>  A:  AKI.  Acute on chronic renal failure. P:   Hold home Lasix Goal CVP 10 to12 when CVL placed NS 100 mL/h NS 1000 x 3 in ED, give one more, then PRN for CVP goal when line placed Trend BMP  GASTROINTESTINAL  Lab 09/06/12 0411  AST 29  ALT 16  ALKPHOS 94  BILITOT 1.1  PROT 7.2  ALBUMIN 2.5*    A:  No active issues. P:   NPO as intubated  HEMATOLOGIC  Lab 09/06/12 0411  HGB 11.0*  HCT 33.7*  PLT 202  INR 1.23  APTT --   A:  Mild anemia. P:  Trend CBC  INFECTIOUS  Lab 09/06/12 0411  WBC 11.0*  PROCALCITON --   Cultures: 9/17  Blood >>> 9/17  Urine >>>  Antibiotics: 9/17  Ceftriaxone >>>  A:  No clear source of infection. P:   Empirical ABx / Cx as above PCT  ENDOCRINE  Lab 09/06/12 0440 09/06/12 0402 09/04/12 1856  GLUCAP 137* 64* 177*   A:  DM.   Hyperglycemia.  Hypoglycemia. P:   Goal CBG 120 to 180 SSI Hold home Metformin / Glipizide  NEUROLOGIC  Head CT:  9/17 >>> nad  A:  Acute encephalopathy. Lyrica? P:   Hold Lyrica / Xanax  BEST PRACTICE / DISPOSITION Level of Care:  ICU Primary Service:  PCCM Consultants:  None Code Status:  Full Diet:  NPO DVT Px:  Protonix GI Px:  Heparin Skin Integrity:  Intact Social / Family:  Family updated at bedside  The patient is critically  ill with multiple organ systems failure and requires high complexity decision making for assessment and support, frequent evaluation and titration of therapies, application of advanced monitoring technologies and extensive interpretation of multiple databases. Critical Care Time devoted to patient care services described in this note is 45 minutes.  Lonia Farber, M.D. Pulmonary and Critical Care Medicine [facility] Pager: [phone]  09/06/2012, 6:12 AM

## 2012-09-06 NOTE — ED Notes (Signed)
RT at bedside - placing an aline. CC MD at bedside also.

## 2012-09-06 NOTE — ED Provider Notes (Signed)
History     CSN: [id number]  Arrival date & time 09/06/12  0356   First MD Initiated Contact with Patient 09/06/12 0408      Chief Complaint  Patient presents with  . Altered Mental Status    (Consider location/radiation/quality/duration/timing/severity/associated sxs/prior treatment) HPI  Please note that this is a late entry. This patient was seen by me immediately upon her arrival to the emergency department via each mass. The patient is a 76 year old diabetic woman with multiple chronic medical problems including cardiomyopathy, hypertension, hyperlipidemia, chronic renal insufficiency and gait instability.  According to paramedics, her daughter awoke at 3 AM and decided to check on the patient. She found the patient unresponsive to loud verbal and tactile stimuli and this prompted her call to 911.   I was able to make contact with the daughter by phone approximately 60-90 minutes after the patient presented to the emergency department. I was unable to get this history. The patient was seen and evaluated in the emergency department 2 days ago with complaints of bilateral foot pain she was diagnosed with diabetic neuropathy and started on Lyrica at a dose of 100 mg twice a day. She took her first dose yesterday morning and so most of the day in bed. She seemed more tired than usual. The daughter last interacted with her at 4 PM. At that time, she is alert, conversant and appropriate.  The daughter says that patient had her p.m. medications at around 8:00 last night. The daughter's husband, the patient's son-in-law, came home from work at 9:30 PM. He checked on the patient's and says that at that time she was mumbling and could not be understood. She was difficult to arouse. Neither person had any interaction with the patient's after that until the episode which prompted call to 911.  Per dtr, the patient has not had any recent fever. Her po intake was somewhat diminished yesterday but she  had no complaints of N/V, pain, sob etc..  Only new medication is Lyrica. Patient has no history of stroke or dementia. She ambulates with a walker. She does not drink alcohol or use illicit drugs. She is not prescribed opiates. She is prescribed prn Xanax but has not had any of this medication in some time.   Again, I am unable to obtain any hx or ROS from the patient due to AMS.  EMS states the patient's accucheck in the field was 77 mg/dL. She did not receive any medications PTA.   Past Medical History  Diagnosis Date  . Coronary artery disease     LHC 05/25/06: No renal artery stenosis, proximal LAD 40%, proximal D1 40%  . Diabetes mellitus     a1c 7.3 in 10/2011  . Hypertension   . Chronic systolic heart failure     Echocardiogram 10/09: EF 30-35%, mild LVH, increased LV filling pressures, mild LAE  . Diabetic retinopathy   . NICM (nonischemic cardiomyopathy)   . Blood transfusion without reported diagnosis   . Anxiety   . Arthritis     Past Surgical History  Procedure Date  . Coronary angioplasty with stent placement   . Eye surgery 2012    both eyes    Family History  Problem Relation Age of Onset  . Heart disease Maternal Grandmother   . Prostate cancer Father   . Colon cancer Maternal Aunt     History  Substance Use Topics  . Smoking status: Never Smoker   . Smokeless tobacco: Not on file  Comment: does have second hand smoke exposure  . Alcohol Use: No    OB History    Grav Para Term Preterm Abortions TAB SAB Ect Mult Living                  Review of Systems - unable to obtain from the patient due to acute AMS  Allergies  Lisinopril  Home Medications   Current Outpatient Rx  Name Route Sig Dispense Refill  . ALPRAZOLAM 0.5 MG PO TABS Oral Take 1 tablet (0.5 mg total) by mouth at bedtime as needed. For sleep or anxiety 30 tablet 5  . AMLODIPINE BESYLATE 10 MG PO TABS Oral Take 1 tablet (10 mg total) by mouth daily. 30 tablet 11  . ASPIRIN EC 81  MG PO TBEC Oral Take 1 tablet (81 mg total) by mouth daily. 30 tablet 11  . BRIMONIDINE TARTRATE 0.2 % OP SOLN Both Eyes Place 1 drop into both eyes daily.    Marland Kitchen CARVEDILOL 25 MG PO TABS Oral Take 1 tablet (25 mg total) by mouth 2 (two) times daily with a meal. 60 tablet 11  . FUROSEMIDE 20 MG PO TABS Oral Take 1 tablet (20 mg total) by mouth 2 (two) times daily. 30 tablet 5  . GLIPIZIDE ER 10 MG PO TB24 Oral Take 1 tablet (10 mg total) by mouth daily. 30 tablet 11  . GLUCOSE BLOOD VI STRP  Use as instructed 100 each 12  . LOSARTAN POTASSIUM 25 MG PO TABS Oral Take 1 tablet (25 mg total) by mouth daily. 30 tablet 11  . METFORMIN HCL 500 MG PO TABS Oral Take 1 tablet (500 mg total) by mouth 2 (two) times daily with a meal. 60 tablet 6  . PREGABALIN 100 MG PO CAPS Oral Take 1 capsule (100 mg total) by mouth 2 (two) times daily. 60 capsule 0  . ROSUVASTATIN CALCIUM 10 MG PO TABS Oral Take 1 tablet (10 mg total) by mouth daily. 30 tablet 11    BP 69/24  Pulse 77  Resp 12  SpO2 94%  Physical Exam Gen: obese, elderly appearing woman brought in on backboard by EMS.  Unresponsive to verbal and tactile stimuli. Head: NCAT, no signs of trauma Eyes: cataracts bilaterally (left > right), surgical pupils bilaterally - thus unable to evaluate for reactivity. Unable to evaluate EOMs Nose: no epistaixis or rhinorrhea Mouth/throat: oral mucosa dehydrated appearing, gag reflex is present but diminished.  Neck: neck is stiff - I have difficulty manually rotating it in an attempt to assess for JVD and possible EJ placement, no stridor, no jvd,  Lungs: CTA B although diminished at both bases, no wheezing, rhonchi or rales, RR 24/min but shallow. Abd: morbidly obese, cannot illicit tenderness, no distension GU: grossly normal  Back: no lesions identified Skin: no rash, wnl Neuro:  GCS (eye opening none - 1), verbal response moans - 2), motor response none - 1.  GCS = 4 on arrival.  Psyche; normal affect,   calm and cooperative.   ED Course  Procedures (including critical care time)  CRITICAL CARE Performed by: Brandt Loosen   Total critical care time: 90 minutes spent in delivery of critical care, exclusive of any procedures. I provided 1:1 care for this patient for most of this time, accompanied her to CT, discussed her case with ICU attending, treated shock and evaluated for AMS.   Critical care time was exclusive of separately billable procedures and treating other patients.  Critical care was  necessary to treat or prevent imminent or life-threatening deterioration.  Critical care was time spent personally by me on the following activities: development of treatment plan with patient and/or surrogate as well as nursing, discussions with consultants, evaluation of patient's response to treatment, examination of patient, obtaining history from patient or surrogate, ordering and performing treatments and interventions, ordering and review of laboratory studies, ordering and review of radiographic studies, pulse oximetry and re-evaluation of patient's condition.   Results for orders placed during the hospital encounter of 09/06/12 (from the past 24 hour(s))  GLUCOSE, CAPILLARY     Status: Abnormal   Collection Time   09/06/12  4:02 AM      Component Value Range   Glucose-Capillary 64 (*) 70 - 99 mg/dL  CBC     Status: Abnormal   Collection Time   09/06/12  4:11 AM      Component Value Range   WBC 11.0 (*) 4.0 - 10.5 K/uL   RBC 3.64 (*) 3.87 - 5.11 MIL/uL   Hemoglobin 11.0 (*) 12.0 - 15.0 g/dL   HCT 14.7 (*) 82.9 - 56.2 %   MCV 92.6  78.0 - 100.0 fL   MCH 30.2  26.0 - 34.0 pg   MCHC 32.6  30.0 - 36.0 g/dL   RDW 13.0  86.5 - 78.4 %   Platelets 202  150 - 400 K/uL  COMPREHENSIVE METABOLIC PANEL     Status: Abnormal   Collection Time   09/06/12  4:11 AM      Component Value Range   Sodium 134 (*) 135 - 145 mEq/L   Potassium 4.5  3.5 - 5.1 mEq/L   Chloride 98  96 - 112 mEq/L   CO2 25   19 - 32 mEq/L   Glucose, Bld 68 (*) 70 - 99 mg/dL   BUN 41 (*) 6 - 23 mg/dL   Creatinine, Ser 6.96 (*) 0.50 - 1.10 mg/dL   Calcium 8.8  8.4 - 29.5 mg/dL   Total Protein 7.2  6.0 - 8.3 g/dL   Albumin 2.5 (*) 3.5 - 5.2 g/dL   AST 29  0 - 37 U/L   ALT 16  0 - 35 U/L   Alkaline Phosphatase 94  39 - 117 U/L   Total Bilirubin 1.1  0.3 - 1.2 mg/dL   GFR calc non Af Amer 19 (*) >90 mL/min   GFR calc Af Amer 22 (*) >90 mL/min  PROTIME-INR     Status: Abnormal   Collection Time   09/06/12  4:11 AM      Component Value Range   Prothrombin Time 15.8 (*) 11.6 - 15.2 seconds   INR 1.23  0.00 - 1.49  TROPONIN I     Status: Normal   Collection Time   09/06/12  4:12 AM      Component Value Range   Troponin I <0.30  <0.30 ng/mL  POCT I-STAT 3, BLOOD GAS (G3P V)     Status: Abnormal   Collection Time   09/06/12  4:27 AM      Component Value Range   pH, Ven 7.337 (*) 7.250 - 7.300   pCO2, Ven 49.6  45.0 - 50.0 mmHg   pO2, Ven 50.0 (*) 30.0 - 45.0 mmHg   Bicarbonate 26.6 (*) 20.0 - 24.0 mEq/L   TCO2 28  0 - 100 mmol/L   O2 Saturation 82.0     Sample type VENOUS    GLUCOSE, CAPILLARY     Status: Abnormal  Collection Time   09/06/12  4:40 AM      Component Value Range   Glucose-Capillary 137 (*) 70 - 99 mg/dL  LACTIC ACID, PLASMA     Status: Normal   Collection Time   09/06/12  4:45 AM      Component Value Range   Lactic Acid, Venous 0.9  0.5 - 2.2 mmol/L  URINALYSIS, WITH MICROSCOPIC     Status: Abnormal   Collection Time   09/06/12  5:01 AM      Component Value Range   Color, Urine ORANGE (*) YELLOW   APPearance CLOUDY (*) CLEAR   Specific Gravity, Urine 1.020  1.005 - 1.030   pH 5.0  5.0 - 8.0   Glucose, UA NEGATIVE  NEGATIVE mg/dL   Hgb urine dipstick NEGATIVE  NEGATIVE   Bilirubin Urine MODERATE (*) NEGATIVE   Ketones, ur 15 (*) NEGATIVE mg/dL   Protein, ur NEGATIVE  NEGATIVE mg/dL   Urobilinogen, UA 4.0 (*) 0.0 - 1.0 mg/dL   Nitrite NEGATIVE  NEGATIVE   Leukocytes, UA TRACE (*)  NEGATIVE   WBC, UA 0-2  <3 WBC/hpf   RBC / HPF 0-2  <3 RBC/hpf   Bacteria, UA FEW (*) RARE   Squamous Epithelial / LPF RARE  RARE   Casts HYALINE CASTS (*) NEGATIVE   Urine-Other MUCOUS PRESENT    GLUCOSE, CAPILLARY     Status: Normal   Collection Time   09/06/12  6:18 AM      Component Value Range   Glucose-Capillary 71  70 - 99 mg/dL  POCT I-STAT 3, BLOOD GAS (G3+)     Status: Abnormal   Collection Time   09/06/12  6:33 AM      Component Value Range   pH, Arterial 7.244 (*) 7.350 - 7.450   pCO2 arterial 56.4 (*) 35.0 - 45.0 mmHg   pO2, Arterial 95.0  80.0 - 100.0 mmHg   Bicarbonate 24.2 (*) 20.0 - 24.0 mEq/L   TCO2 26  0 - 100 mmol/L   O2 Saturation 95.0     Acid-base deficit 3.0 (*) 0.0 - 2.0 mmol/L   Patient temperature 100.1 F     Collection site ARTERIAL LINE     Drawn by RT     Sample type ARTERIAL      EKG: nsr, no acute ischemic changes, normal intervals, normal qrs  CXR: cardiomegaly, mild pulmonary edema. Reviewed by me and read by Rads.  Head CT: no acute intracranial process identified. Reviewed by  Me and read by Rads.    MDM  DDX: stroke, drug overdose or toxicity, metabolic disturbance, encephalopathy, CO2 narcosis, sepsis, uremia, endocrinopathy.   The patient initially had normal BP. However, I was called to her bedside approximately 20 minutes after I first first left her room. I was called to the bedside for hypotension. The patient had a blood pressure in the 70s over 40s on the monitor. This was repeated and her blood pressure was 60s over 40s. I checked the patient's manual BP and it was 58 systolic. We initiated tx with crystalloid boluses without adequate response or really any improvement in BP.   In light of the patient's history of severe cardiomyopathy, I elected to start norepinephrine early and continue fluid resuscitation. However, there was a significant delay between the order for norepinephrine and initiation of this medication. In total, the  patient had just over 2 L of crystalloid prior to the infusion of norepinephrine. Norepinephrine was started at 10 mcg  per minute and the patient's blood pressure improved to the 120s over 70s. An arterial line was placed for her more accurate for pressure recording.  Overall, the patient's emergency department workup has been nondiagnostic. Her initial venous blood gas showed a very slightly elevated CO2 level of around 50 and normal pH. Repeat ABG done at 6:30 AM. This showed a pH of 7.24 and a climbing PCO2 of 56. The patient was found to have chronic stable renal failure and anemia. At one point, her blood sugar was 67. This was treated successfully with one half amp of D50. The patient received IV Narcan 1 mg without any improvement in her mental status. No significant electrolyte abnormalities were found which would explain the patient's presentation. CXR and CT brain non-diagnostic.   ICU consulted. I was preparing to intubate the patient for 1) AMS with GCS 5 and 2) anticipation of pulmonary edema from aggressive fluid resuscitation. Dr. Herma Carson requested that we hold off. The patient's gag reflex remained intact. As her BP improved, her MS did as well. She responded with a loud moan to noxious physical stimulus.  At one point, her son arrived and spoke loudly to the patient and she seemed to acknowledge him but did not open her eyes or given an intelligible verbal response.   At this point, the patient has been stabilized and care transferred to Dr. Herma Carson, ICU attending. Blood and urine cultures drawn and pending.   Etiology of MS and shock unclear at time of disposition to the ICU.        Brandt Loosen, MD 09/06/12 0730

## 2012-09-06 NOTE — ED Notes (Signed)
Returned from ct 

## 2012-09-06 NOTE — ED Notes (Addendum)
Brought to ED for altered level of consciousness. Family went to check on her about 0300 and had a change in her breathing and called EMS. Upon arrival of EMS arrival pt was unresponsive with shallow respirations; placed on NRB with O2 Sat of 100%. Minimal response to painful stimuli. Some response to painful stimuli. Resp even unlabored. Airway patent. Pt started on Lyrica 100mg  BID yesterday for leg pain. Took 2 doses yesterday.

## 2012-09-06 NOTE — ED Notes (Signed)
Son here & pt more responsive; responded when son told her he was there. Gag reflex stronger. Pt grimaces when mouth suctioned.

## 2012-09-06 NOTE — ED Notes (Signed)
RT showed Dr. Marin Shutter ABG results. Pt arouses more and will hold on intubation for now and continue to monitor pt.

## 2012-09-07 ENCOUNTER — Ambulatory Visit: Payer: Medicare Other | Admitting: Internal Medicine

## 2012-09-07 DIAGNOSIS — I2510 Atherosclerotic heart disease of native coronary artery without angina pectoris: Secondary | ICD-10-CM

## 2012-09-07 DIAGNOSIS — G4723 Circadian rhythm sleep disorder, irregular sleep wake type: Secondary | ICD-10-CM

## 2012-09-07 DIAGNOSIS — I5042 Chronic combined systolic (congestive) and diastolic (congestive) heart failure: Secondary | ICD-10-CM

## 2012-09-07 DIAGNOSIS — N189 Chronic kidney disease, unspecified: Secondary | ICD-10-CM

## 2012-09-07 LAB — CBC
HCT: 34.3 % — ABNORMAL LOW (ref 36.0–46.0)
Hemoglobin: 11.2 g/dL — ABNORMAL LOW (ref 12.0–15.0)
MCH: 30 pg (ref 26.0–34.0)
MCHC: 32.7 g/dL (ref 30.0–36.0)
RDW: 12.7 % (ref 11.5–15.5)

## 2012-09-07 LAB — URINE CULTURE

## 2012-09-07 LAB — CBC WITH DIFFERENTIAL/PLATELET
Basophils Absolute: 0 10*3/uL (ref 0.0–0.1)
Eosinophils Relative: 2 % (ref 0–5)
Lymphocytes Relative: 10 % — ABNORMAL LOW (ref 12–46)
Lymphs Abs: 1 10*3/uL (ref 0.7–4.0)
MCV: 91.4 fL (ref 78.0–100.0)
Neutro Abs: 7.7 10*3/uL (ref 1.7–7.7)
Neutrophils Relative %: 80 % — ABNORMAL HIGH (ref 43–77)
Platelets: 231 10*3/uL (ref 150–400)
RBC: 3.83 MIL/uL — ABNORMAL LOW (ref 3.87–5.11)
WBC: 9.6 10*3/uL (ref 4.0–10.5)

## 2012-09-07 LAB — GLUCOSE, CAPILLARY
Glucose-Capillary: 150 mg/dL — ABNORMAL HIGH (ref 70–99)
Glucose-Capillary: 171 mg/dL — ABNORMAL HIGH (ref 70–99)
Glucose-Capillary: 76 mg/dL (ref 70–99)
Glucose-Capillary: 83 mg/dL (ref 70–99)

## 2012-09-07 LAB — BASIC METABOLIC PANEL
BUN: 30 mg/dL — ABNORMAL HIGH (ref 6–23)
Calcium: 9 mg/dL (ref 8.4–10.5)
GFR calc Af Amer: 44 mL/min — ABNORMAL LOW (ref >90)
GFR calc non Af Amer: 38 mL/min — ABNORMAL LOW (ref >90)
Glucose, Bld: 86 mg/dL (ref 70–99)

## 2012-09-07 LAB — TROPONIN I: Troponin I: <0.3 ng/mL (ref <0.30)

## 2012-09-07 MED ORDER — BISACODYL 5 MG PO TBEC
10.0000 mg | DELAYED_RELEASE_TABLET | Freq: Once | ORAL | Status: AC
  Administered 2012-09-07: 10 mg via ORAL
  Filled 2012-09-07: qty 2

## 2012-09-07 NOTE — Progress Notes (Signed)
Patient has arrived to unit, report received.  Patient is requesting laxative, MD notified and orders given.  Lorretta Harp RN

## 2012-09-07 NOTE — Progress Notes (Signed)
Nutrition Brief Note  Patient identified on the Malnutrition Screening Tool (MST) report for weight loss and poor appetite, generating a score of 5.   From discussion with patient and her daughter, patient was supposed to be weighing herself every morning to make sure she wasn't gaining fluid weight.  When under the care of her son, she was not doing this, so the physician's office took the scale away from her.  Patient has recently moved in with her daughter who reports that patient eats well. No reported weight loss.  Body mass index is 43.38 kg/(m^2). Pt meets criteria for extreme obesity, class III based on current BMI.   Current diet order is clear liquids. Labs and medications reviewed.   No nutrition interventions warranted at this time. If nutrition issues arise, please consult RD.   Joaquin Courts, RD, LDN, CNSC Pager# [phone] After Hours Pager# [phone]

## 2012-09-07 NOTE — Progress Notes (Signed)
Name: [name] MRN: [id number] DOB: 1936-02-18    LOS: 1  Referring Provider:  EDP Reason for Referral:  Hypotension / acute encephalopathy  PULMONARY / CRITICAL CARE MEDICINE   Brief Summary:  76 yo with OSA recently started on Lyrica brought to [facility] ED with altered mental status, hypotensive.  Lines: A line 9/18>>>9/19 Micro: Blood culture x 2 9/17 >>> NGTD UCx 9/17 >>> NGTD  Events since Admission: 09/06/12: off pressors, neuro improved  Vital Signs: Temp:  [96.9 F (36.1 C)-98 F (36.7 C)] 98 F (36.7 C) (09/18 1200) Pulse Rate:  [66-80] 77  (09/18 1300) Resp:  [15-22] 19  (09/18 1300) BP: (102-122)/(46-50) 102/48 mmHg (09/17 1900) SpO2:  [98 %-100 %] 99 % (09/18 1300) Weight:  [121.8 kg (268 lb 8.3 oz)-121.9 kg (268 lb 11.9 oz)] 121.9 kg (268 lb 11.9 oz) (09/18 0700)  Physical Examination: General:  No distress, obese Neuro:  AAOx3, resolved HEENT:  Sluggish pupils, dry mucous membranes Cardiovascular:  RRR, no m/r/g Lungs:  Bilateral diminished air entry, no w/r/r Abdomen:  Obese, soft, nontender, nondistended, bowel sounds present Musculoskeletal:  Bilateral pitting pedal edema Skin:  No rash  Active Problems:  DIABETES MELLITUS, TYPE II  HYPERLIPIDEMIA  HYPERTENSION  CORONARY ARTERY DISEASE  CARDIOMYOPATHY, SECONDARY  RENAL INSUFFICIENCY, CHRONIC  OSA (obstructive sleep apnea)  ASSESSMENT AND PLAN  PULMONARY  Lab 09/06/12 0633 09/06/12 0427  PHART 7.244* --  PCO2ART 56.4* --  PO2ART 95.0 --  HCO3 24.2* 26.6*  O2SAT 95.0 82.0   CXR:  9/17 >>> Small volumes, cardiomegaly, no overt infiltrates / edema  A: History of OSA.  Possible OHS.  Protects airway at this time. P:   Goal SpO2 > 92 Supplemental oxygen Does not seem overloaded  CARDIOVASCULAR  Lab 09/07/12 1210 09/07/12 0605 09/07/12 0008 09/06/12 1750 09/06/12 1749 09/06/12 1231 09/06/12 0648 09/06/12 0445  TROPONINI <0.30 <0.30 <0.30 -- <0.30 <0.30 -- --  LATICACIDVEN -- --  -- 1.0 -- 0.7 0.5 0.9  PROBNP -- -- -- -- -- -- -- --   ECG:  Movement on tracing, no sig st changes 17th Lines: R rad A-line 9/17 >>> A line removed 9/18  A:  Hypotension in setting of severe dehydration, multiple antihypertensives and diuretics. Troponins negative x 3 Lactic Acid 1.0 BP cuff is inaccurate P:  Goal MAP > 60, but will need to realize major increase in sys on aline Dc aline Hold preadmission Norvasc, Cozaar, Crestor, ASA will call Dr [name], prim cards to eval when to restart home beta blockers, lasix and dose  RENAL  Lab 09/07/12 0605 09/06/12 0411  NA 136 134*  K 5.0 4.5  CL 107 98  CO2 20 25  BUN 30* 41*  CREATININE 1.34* 2.40*  CALCIUM 9.0 8.8  MG 1.9 --  PHOS 3.3 --   Intake/Output      09/17 0701 - 09/18 0700 09/18 0701 - 09/19 0700   P.O.  120   I.V. (mL/kg) 1228.1 (10.1) 70 (0.6)   IV Piggyback 50 100   Total Intake(mL/kg) 1278.1 (10.5) 290 (2.4)   Urine (mL/kg/hr) 2770 (0.9) 550 (0.6)   Total Output 2770 550   Net -1491.9 -260         Foley:  9/17 >>>  A:   AKI.  Acute on chronic renal failure, pre renal P:   Hold home Lasix, adjust per cards in future NS 100 mL/h, reduce Trend BMP  GASTROINTESTINAL  Lab 09/06/12 0411  AST 29  ALT 16  ALKPHOS 94  BILITOT 1.1  PROT 7.2  ALBUMIN 2.5*    A:  No active issues. P:   NPO as intubated, advance carb mod ppi  HEMATOLOGIC  Lab 09/07/12 0605 09/06/12 0411  HGB 11.2* 11.0*  HCT 34.3* 33.7*  PLT 206 202  INR -- 1.23  APTT -- --   A:   Mild anemia. Stable P:  Trend CBC with pos balance Sub q   INFECTIOUS  Lab 09/07/12 0605 09/06/12 0641 09/06/12 0411  WBC 9.7 -- 11.0*  PROCALCITON -- 0.48 --   Cultures: 9/17  Blood >>> NGTD 9/17  Urine >>> NGTD  Antibiotics: 9/17  Ceftriaxone >>>  A:   No clear source of infection. If patient remains afebrile and culture negative, D/C in 24 hours. P:   Empirical ABx / Cx as above Pct neg in am , dc all abx , assume clinical  status good  ENDOCRINE  Lab 09/07/12 1214 09/07/12 0806 09/07/12 0424 09/07/12 0042 09/06/12 2006  GLUCAP 150* 85 76 83 89   A:   DM.  Hyperglycemia.  Hypoglycemia. P:   Goal CBG 120 to 180 SSI Hold home Metformin / Glipizide Diet - Diabetic   NEUROLOGIC  Head CT:  9/17 >>> nad  A:   Acute encephalopathy. Resolved P:   Hold Lyrica / Xanax Map wnl  BEST PRACTICE / DISPOSITION Level of Care:  ICU to tele Primary Service:  PCCM to traid, d/w Revonda Standard Consultants:  None Code Status:  Full Diet:  Diabetic  DVT Px:  Protonix GI Px:  Heparin Skin Integrity:  Intact Social / Family:  Not at bedside during patient interview. Concern is not being taken care of well at home, social workl  Disposition - D/C from ICU to floor   Verlin Grills [facility] 09/07/2012, 2:40 PM  I have fully examined this patient and agree with above findings.    And edited in full  Mcarthur Rossetti. Tyson Alias, MD, FACP Pgr: [phone] [place]

## 2012-09-07 NOTE — Progress Notes (Signed)
Pt transferred to [id number] via bed and accepted by RN.

## 2012-09-08 ENCOUNTER — Inpatient Hospital Stay (HOSPITAL_COMMUNITY): Payer: Medicare Other

## 2012-09-08 ENCOUNTER — Ambulatory Visit (HOSPITAL_COMMUNITY): Payer: Medicare Other

## 2012-09-08 DIAGNOSIS — R4182 Altered mental status, unspecified: Secondary | ICD-10-CM

## 2012-09-08 LAB — COMPREHENSIVE METABOLIC PANEL
ALT: 15 U/L (ref 0–35)
AST: 25 U/L (ref 0–37)
Albumin: 2.4 g/dL — ABNORMAL LOW (ref 3.5–5.2)
Alkaline Phosphatase: 113 U/L (ref 39–117)
CO2: 20 mEq/L (ref 19–32)
Chloride: 106 mEq/L (ref 96–112)
GFR calc non Af Amer: 48 mL/min — ABNORMAL LOW (ref >90)
Potassium: 4.8 mEq/L (ref 3.5–5.1)
Sodium: 139 mEq/L (ref 135–145)
Total Bilirubin: 0.6 mg/dL (ref 0.3–1.2)

## 2012-09-08 LAB — GLUCOSE, CAPILLARY
Glucose-Capillary: 119 mg/dL — ABNORMAL HIGH (ref 70–99)
Glucose-Capillary: 123 mg/dL — ABNORMAL HIGH (ref 70–99)
Glucose-Capillary: 129 mg/dL — ABNORMAL HIGH (ref 70–99)

## 2012-09-08 MED ORDER — PANTOPRAZOLE SODIUM 40 MG PO TBEC
40.0000 mg | DELAYED_RELEASE_TABLET | Freq: Every day | ORAL | Status: DC
  Administered 2012-09-08 – 2012-09-13 (×6): 40 mg via ORAL
  Filled 2012-09-08 (×4): qty 1

## 2012-09-08 MED ORDER — INSULIN ASPART 100 UNIT/ML ~~LOC~~ SOLN
0.0000 [IU] | Freq: Three times a day (TID) | SUBCUTANEOUS | Status: DC
  Administered 2012-09-08 – 2012-09-10 (×5): 2 [IU] via SUBCUTANEOUS
  Administered 2012-09-10: 5 [IU] via SUBCUTANEOUS
  Administered 2012-09-11: 3 [IU] via SUBCUTANEOUS
  Administered 2012-09-12 (×3): 2 [IU] via SUBCUTANEOUS
  Administered 2012-09-12: 3 [IU] via SUBCUTANEOUS
  Administered 2012-09-13 (×2): 2 [IU] via SUBCUTANEOUS

## 2012-09-08 MED ORDER — CARVEDILOL 3.125 MG PO TABS
3.1250 mg | ORAL_TABLET | Freq: Two times a day (BID) | ORAL | Status: DC
  Administered 2012-09-08 – 2012-09-10 (×5): 3.125 mg via ORAL
  Filled 2012-09-08 (×6): qty 1

## 2012-09-08 MED ORDER — ASPIRIN 81 MG PO CHEW
CHEWABLE_TABLET | ORAL | Status: AC
  Filled 2012-09-08: qty 1

## 2012-09-08 MED ORDER — DOCUSATE SODIUM 100 MG PO CAPS
100.0000 mg | ORAL_CAPSULE | Freq: Two times a day (BID) | ORAL | Status: DC
  Administered 2012-09-08 – 2012-09-13 (×9): 100 mg via ORAL
  Filled 2012-09-08 (×12): qty 1

## 2012-09-08 MED ORDER — ACETAMINOPHEN 325 MG PO TABS
650.0000 mg | ORAL_TABLET | Freq: Four times a day (QID) | ORAL | Status: DC | PRN
  Administered 2012-09-08 – 2012-09-10 (×2): 650 mg via ORAL
  Filled 2012-09-08 (×3): qty 2

## 2012-09-08 MED ORDER — ASPIRIN EC 81 MG PO TBEC
81.0000 mg | DELAYED_RELEASE_TABLET | Freq: Every day | ORAL | Status: DC
  Administered 2012-09-08 – 2012-09-13 (×6): 81 mg via ORAL
  Filled 2012-09-08 (×7): qty 1

## 2012-09-08 MED ORDER — SENNA 8.6 MG PO TABS
1.0000 | ORAL_TABLET | Freq: Two times a day (BID) | ORAL | Status: DC
  Administered 2012-09-08 – 2012-09-13 (×9): 8.6 mg via ORAL
  Filled 2012-09-08 (×12): qty 1

## 2012-09-08 NOTE — Progress Notes (Signed)
Subjective: Patient doing better today, but still hallucinating per daughters.  Was seeing people in the room.  Had a fever this morning.  Oriented x3.  Objective: Vital signs in last 24 hours: Filed Vitals:   09/07/12 2217 09/08/12 0513 09/08/12 0759 09/08/12 1100  BP: 158/82 102/81 159/80   Pulse: 96 92 98   Temp: 99.1 F (37.3 C) 98.8 F (37.1 C) 101.7 F (38.7 C) 100.3 F (37.9 C)  TempSrc: Oral Oral Oral Oral  Resp: 20 18 18    Height:      Weight:  116.7 kg (257 lb 4.4 oz)    SpO2: 90% 96% 94%    Weight change: -3.5 kg (-7 lb 11.5 oz)  Intake/Output Summary (Last 24 hours) at 09/08/12 1121 Last data filed at 09/08/12 0900  Gross per 24 hour  Intake   1130 ml  Output    400 ml  Net    730 ml    Physical Exam: General: Awake, Oriented x3, No acute distress. HEENT: EOMI. Neck: Supple CV: S1 and S2 Lungs: Clear to ascultation bilaterally Abdomen: Soft, Nontender, Nondistended, +bowel sounds. Ext: Good pulses. Trace edema.  Lab Results: Basic Metabolic Panel:  Lab 09/08/12 1610 09/07/12 0605 09/06/12 0411  NA 139 136 134*  K 4.8 5.0 4.5  CL 106 107 98  CO2 20 20 25   GLUCOSE 124* 86 68*  BUN 20 30* 41*  CREATININE 1.12* 1.34* 2.40*  CALCIUM 9.2 9.0 8.8  MG -- 1.9 --  PHOS -- 3.3 --   Liver Function Tests:  Lab 09/08/12 0530 09/06/12 0411  AST 25 29  ALT 15 16  ALKPHOS 113 94  BILITOT 0.6 1.1  PROT 7.2 7.2  ALBUMIN 2.4* 2.5*   No results found for this basename: LIPASE:5,AMYLASE:5 in the last 168 hours No results found for this basename: AMMONIA:5 in the last 168 hours CBC:  Lab 09/07/12 1551 09/07/12 0605 09/06/12 0411  WBC 9.6 9.7 11.0*  NEUTROABS 7.7 -- --  HGB 11.7* 11.2* 11.0*  HCT 35.0* 34.3* 33.7*  MCV 91.4 92.0 92.6  PLT 231 206 202   Cardiac Enzymes:  Lab 09/07/12 1818 09/07/12 1210 09/07/12 0605 09/07/12 0008 09/06/12 1749  CKTOTAL -- -- -- -- --  CKMB -- -- -- -- --  CKMBINDEX -- -- -- -- --  TROPONINI <0.30 <0.30 <0.30 <0.30  <0.30   BNP (last 3 results)  Basename 03/10/12 1620 03/04/12 1039 02/26/12 1931  PROBNP 617.2* 144.0* 1041.0*   CBG:  Lab 09/08/12 0707 09/08/12 0445 09/08/12 0017 09/07/12 2139 09/07/12 1648  GLUCAP 123* 119* 133* 113* 171*   No results found for this basename: HGBA1C:5 in the last 72 hours Other Labs: No components found with this basename: POCBNP:3 No results found for this basename: DDIMER:2 in the last 168 hours No results found for this basename: CHOL:2,HDL:2,LDLCALC:2,TRIG:2,CHOLHDL:2,LDLDIRECT:2 in the last 168 hours No results found for this basename: TSH,T4TOTAL,FREET3,T3FREE,FREET4,THYROIDAB in the last 168 hours No results found for this basename: VITAMINB12:2,FOLATE:2,FERRITIN:2,TIBC:2,IRON:2,RETICCTPCT:2 in the last 168 hours  Micro Results: Recent Results (from the past 240 hour(s))  URINE CULTURE     Status: Normal   Collection Time   09/06/12  5:01 AM      Component Value Range Status Comment   Specimen Description URINE, RANDOM   Final    Special Requests ADDED 0626   Final    Culture  Setup Time 09/06/2012 06:28   Final    Colony Count NO GROWTH   Final  Culture NO GROWTH   Final    Report Status 09/07/2012 FINAL   Final   CULTURE, BLOOD (ROUTINE X 2)     Status: Normal (Preliminary result)   Collection Time   09/06/12  6:45 AM      Component Value Range Status Comment   Specimen Description BLOOD LEFT ARM   Final    Special Requests BOTTLES DRAWN AEROBIC ONLY 5CC   Final    Culture  Setup Time 09/06/2012 11:24   Final    Culture     Final    Value:        BLOOD CULTURE RECEIVED NO GROWTH TO DATE CULTURE WILL BE HELD FOR 5 DAYS BEFORE ISSUING A FINAL NEGATIVE REPORT   Report Status PENDING   Incomplete   CULTURE, BLOOD (ROUTINE X 2)     Status: Normal (Preliminary result)   Collection Time   09/06/12  6:55 AM      Component Value Range Status Comment   Specimen Description BLOOD RIGHT A-LINE   Final    Special Requests BOTTLES DRAWN AEROBIC ONLY 10CC    Final    Culture  Setup Time 09/06/2012 11:24   Final    Culture     Final    Value:        BLOOD CULTURE RECEIVED NO GROWTH TO DATE CULTURE WILL BE HELD FOR 5 DAYS BEFORE ISSUING A FINAL NEGATIVE REPORT   Report Status PENDING   Incomplete   MRSA PCR SCREENING     Status: Normal   Collection Time   09/06/12 10:39 AM      Component Value Range Status Comment   MRSA by PCR NEGATIVE  NEGATIVE Final     Studies/Results: No results found.  Medications: I have reviewed the patient's current medications. Scheduled Meds:   . antiseptic oral rinse  15 mL Mouth Rinse q12n4p  . bisacodyl  10 mg Oral Once  . brimonidine  1 drop Both Eyes Daily  . cefTRIAXone (ROCEPHIN)  IV  2 g Intravenous Q24H  . heparin subcutaneous  5,000 Units Subcutaneous Q8H  . insulin aspart  0-15 Units Subcutaneous Q4H  . pantoprazole (PROTONIX) IV  40 mg Intravenous Q24H  . DISCONTD: chlorhexidine  15 mL Mouth Rinse BID   Continuous Infusions:   . sodium chloride 50 mL/hr at 09/08/12 0056  . DISCONTD: norepinephrine (LEVOPHED) Adult infusion 3 mcg/min (09/06/12 1700)   PRN Meds:.acetaminophen, ALPRAZolam  Assessment/Plan: Acute delirium/altered mental status/acute encephalopathy Etiology unclear.  Head CT on 09/06/2012 showed no acute findings.  Uncertain if patient had adverse affect due to Lyrica which was recently started on 09/05/2012.  Lyrica has been discontinued.  Will get an MRI of the brain, and EEG for further evaluation, to evaluate for any CVA or seizure.  Chest x-ray on 09/06/2012 showed cardiomegaly without acute disease.  Urine analysis on 09/06/2012 was suggestive of a urinary tract infection however urine culture showed no growth to date.  Patient on empiric ceftriaxone, started on 09/06/2012.  Continue antibiotic for now.  Fever of 101.7 on 09/08/2012 Etiology unclear.  Infectious workup, blood cultures x2 and urine culture in 09/06/2012 showed no growth.  Send for repeat blood cultures on  09/08/2012.  Acute renal failure on chronic kidney disease stage III Creatinine improved to baseline of 1.24 - 1.4.  Likely due to dehydration, initial creatinine on admission was 2.4.  Continue to hold diuretics.  Discontinue IV hydration.  Dehydration Resolved with IV hydration.  Saline lock fluids.  Diabetes type II uncontrolled with complication Continue to hold metformin and glipizide.  Depending on patient's clinical course consider restarting glipizide.  Continue sliding scale insulin.  Obstructive sleep apnea Stable.  Continue CPAP at each bedtime as per respiratory if the patient uses CPAP at home.  Hypertension Improved.  Patient's antihypertensive medications have been held.  Restart carvedilol at a lower dose 3.125 mg PO BID. Continue to hold amlodipine and losartan for now.  Reintroduce antihypertensive medications depending on patient's clinical course.  Titrate carvedilol depending on patient's clinical course.  Hyperlipidemia Statin held for now.  Glaucoma/Cataracts/Blindness Continue Brimonidine.  Coronary artery disease Continue aspirin.  Restarted on beta blocker.  Generalized weakness PT/OT consult.  Prophylaxis SCDs  Disposition Patient is a internal medicine teaching service patient (PCP is Dr. Dorise Hiss). Internal medicine teaching service has accepted patient's care and will assume patient's care on 09/09/2012   LOS: 2 days  Judianne Seiple A, MD 09/08/2012, 11:21 AM

## 2012-09-08 NOTE — Progress Notes (Signed)
Text page to Dr Betti Cruz to notify of temp of 101.7 and other vs. Orders received.
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
## 2012-09-08 NOTE — Progress Notes (Signed)
Clinical Social Worker received phone call from pt's dtr-[name].  Per [name], pt will forfeit her section 8 housing voucher and continue to reside with pt's dtr.  Dtr is interested in securing HCPOA to assist ongoing medical needs.  Dtr thanked CSW for intervention.  Unit CSW to continue to follow.    Angelia Mould, MSW, [place] [phone]
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
## 2012-09-09 ENCOUNTER — Inpatient Hospital Stay (HOSPITAL_COMMUNITY): Payer: Medicare Other

## 2012-09-09 DIAGNOSIS — K5900 Constipation, unspecified: Secondary | ICD-10-CM

## 2012-09-09 DIAGNOSIS — N289 Disorder of kidney and ureter, unspecified: Secondary | ICD-10-CM

## 2012-09-09 LAB — URINALYSIS, ROUTINE W REFLEX MICROSCOPIC
Bilirubin Urine: NEGATIVE
Glucose, UA: NEGATIVE mg/dL
Ketones, ur: NEGATIVE mg/dL
Specific Gravity, Urine: 1.016 (ref 1.005–1.030)
pH: 6 (ref 5.0–8.0)

## 2012-09-09 LAB — URINE MICROSCOPIC-ADD ON

## 2012-09-09 LAB — GLUCOSE, CAPILLARY
Glucose-Capillary: 96 mg/dL (ref 70–99)
Glucose-Capillary: 112 mg/dL — ABNORMAL HIGH (ref 70–99)

## 2012-09-09 MED ORDER — ATORVASTATIN CALCIUM 20 MG PO TABS
20.0000 mg | ORAL_TABLET | Freq: Every day | ORAL | Status: DC
  Administered 2012-09-09 – 2012-09-12 (×4): 20 mg via ORAL
  Filled 2012-09-09 (×6): qty 1

## 2012-09-09 NOTE — Progress Notes (Signed)
Utilization Review Completed.  

## 2012-09-09 NOTE — Progress Notes (Signed)
Routine EEG completed.  

## 2012-09-09 NOTE — Progress Notes (Signed)
Call to MD  Dr Troy Sine aware of pt's post void residual of 520 cc. Order to place a foley catheter.
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
## 2012-09-09 NOTE — Evaluation (Signed)
Physical Therapy Evaluation Patient Details Name: [name] MRN: [id number] DOB: Jun 07, 1936 Today's Date: 09/09/2012 Time: 4098-1191 PT Time Calculation (min): 23 min  PT Assessment / Plan / Recommendation Clinical Impression  Patient is a 76 yo female admitted with AMS, hypotension, and dehydration.  Patient requiring +2 total assist for bed mobility.  Blindness also impacts mobility - patient fearful of falling.  Recommended staff use lift equipment to move patient out of bed.  Patient requiring much more assist now than pta.  Recommend SNF at discharge for continued therapy.  Patient will benefit from acute PT for mobility training and education.    PT Assessment  Patient needs continued PT services    Follow Up Recommendations  Skilled nursing facility    Barriers to Discharge        Equipment Recommendations  Defer to next venue    Recommendations for Other Services     Frequency Min 3X/week    Precautions / Restrictions Precautions Precautions: Fall Restrictions Weight Bearing Restrictions: No         Mobility  Bed Mobility Bed Mobility: Rolling Right;Rolling Left;Right Sidelying to Sit;Sitting - Scoot to [place] of Bed;Sit to Sidelying Right Rolling Right: 1: +2 Total assist;With rail Rolling Right: Patient Percentage: 30% Rolling Left: 1: +2 Total assist;With rail Rolling Left: Patient Percentage: 30% Right Sidelying to Sit: 1: +2 Total assist;With rails;HOB flat Right Sidelying to Sit: Patient Percentage: 20% Sitting - Scoot to Edge of Bed: 1: +2 Total assist Sitting - Scoot to Edge of Bed: Patient Percentage: 20% Sit to Sidelying Right: 1: +2 Total assist Sit to Sidelying Right: Patient Percentage: 20% Details for Bed Mobility Assistance: Verbal and tactile cues for technique.  Patient fearful of falling initially with sitting.   Transfers Transfers: Not assessed    Exercises     PT Diagnosis: Difficulty walking;Generalized weakness;Altered mental  status  PT Problem List: Decreased strength;Decreased activity tolerance;Decreased balance;Decreased mobility;Decreased cognition;Obesity PT Treatment Interventions: DME instruction;Gait training;Functional mobility training;Therapeutic activities;Therapeutic exercise;Balance training;Cognitive remediation;Patient/family education   PT Goals Acute Rehab PT Goals PT Goal Formulation: With patient Time For Goal Achievement: 09/23/12 Potential to Achieve Goals: Good Pt will Roll Supine to Right Side: with modified independence;with rail PT Goal: Rolling Supine to Right Side - Progress: Goal set today Pt will Roll Supine to Left Side: with modified independence;with rail PT Goal: Rolling Supine to Left Side - Progress: Goal set today Pt will go Supine/Side to Sit: with min assist;with HOB 0 degrees PT Goal: Supine/Side to Sit - Progress: Goal set today Pt will Sit at Edge of Bed: with supervision;6-10 min;with unilateral upper extremity support PT Goal: Sit at [place] Of Bed - Progress: Goal set today Pt will go Sit to Supine/Side: with mod assist;with HOB 0 degrees PT Goal: Sit to Supine/Side - Progress: Goal set today Pt will go Sit to Stand: with mod assist;with upper extremity assist PT Goal: Sit to Stand - Progress: Goal set today Pt will go Stand to Sit: with mod assist;with upper extremity assist PT Goal: Stand to Sit - Progress: Goal set today Pt will Transfer Bed to Chair/Chair to Bed: with mod assist PT Transfer Goal: Bed to Chair/Chair to Bed - Progress: Goal set today  Visit Information  Last PT Received On: 09/09/12 Assistance Needed: +2 PT/OT Co-Evaluation/Treatment: Yes    Subjective Data  Subjective: Rambling speech.   Patient Stated Goal: To get stronger   Prior Functioning  Home Living Lives With: Daughter Available Help at Discharge: Skilled  Nursing Facility Type of Home: House Bathroom Shower/Tub: Engineer, manufacturing systems: [employer] [facility] [employer]  Equipment: Walker - rolling;Tub transfer bench;Bedside commode/3-in-1 Additional Comments: No family present to assist with home living information.  Prior Function Level of Independence: Needs assistance Needs Assistance: Bathing;Dressing;Meal Prep;Light Housekeeping;Transfers Bath: Moderate Dressing: Moderate Meal Prep: Total Light Housekeeping: Total Transfer Assistance: Assist to transfer bed to w/c Driving: No Comments: Pt unable to provide detailed description of PLOF, but describes herself as walking around the house with RW and sitting on seat in bathroom for showering up until a few weeks ago.   Communication Communication: No difficulties (Patient blind)    Cognition  Overall Cognitive Status: No family/caregiver present to determine baseline cognitive functioning Arousal/Alertness: Awake/alert Orientation Level: Appears intact for tasks assessed Behavior During Session: [facility] for tasks performed Cognition - Other Comments: Pt very talkative throughout session but required cueing to redirect to answering therapist questions.  Increased time to process questions/commands.  Pt does report she has been seeing tall angels standing in her room.    Extremity/Trunk Assessment Right Upper Extremity Assessment RUE ROM/Strength/Tone: WFL for tasks assessed Left Upper Extremity Assessment LUE ROM/Strength/Tone: WFL for tasks assessed Right Lower Extremity Assessment RLE ROM/Strength/Tone: Deficits RLE ROM/Strength/Tone Deficits: Decreased strength 3/5 Left Lower Extremity Assessment LLE ROM/Strength/Tone: Deficits LLE ROM/Strength/Tone Deficits: Decreased strength 3/5   Balance Balance Balance Assessed: Yes Static Sitting Balance Static Sitting - Balance Support: Bilateral upper extremity supported;Feet unsupported Static Sitting - Level of Assistance: 3: Mod assist;5: Stand by assistance Static Sitting - Comment/# of Minutes: Pt sat EOB for 10 min.  Initially pt required  intermittent mod assist due to right lateral lean.  Pt able to obtain midline position with mod tactile/manual cueing to left elbow. Towards end of sitting balance task, pt able to maintain midline position and balance with stand by assist.  End of Session PT - End of Session Activity Tolerance: Patient limited by fatigue Patient left: in bed;with call bell/phone within reach Nurse Communication: Mobility status;Need for lift equipment  GP     Vena Austria 09/09/2012, 6:52 PM Durenda Hurt. Renaldo Fiddler, [facility] Acute Rehab Services Pager [phone]

## 2012-09-09 NOTE — Progress Notes (Signed)
Occupational Therapy Evaluation Patient Details Name: [name] MRN: [id number] DOB: Jan 18, 1936 Today's Date: 09/09/2012 Time: 4098-1191 OT Time Calculation (min): 22 min  OT Assessment / Plan / Recommendation Clinical Impression  Pt admitted with AMS. Pt has been living at home with family but level of assist is unclear (no family present to provide information).  Pt requiring significant level of assist during eval session. Will benefit from acute OT to address below problem list. Recommending SNF for discharge planning.     OT Assessment  Patient needs continued OT Services    Follow Up Recommendations  Supervision/Assistance - 24 hour;Skilled nursing facility    Barriers to Discharge   unsure of level of assist available  Equipment Recommendations  Defer to next venue    Recommendations for Other Services    Frequency  Min 2X/week    Precautions / Restrictions Precautions Precautions: Fall   Pertinent Vitals/Pain See vitals    ADL  Upper Body Bathing: Simulated;Moderate assistance Where Assessed - Upper Body Bathing: Supported sitting Lower Body Bathing: Simulated;+1 Total assistance Where Assessed - Lower Body Bathing: Supported sitting Upper Body Dressing: Simulated;Moderate assistance Where Assessed - Upper Body Dressing: Unsupported sitting Lower Body Dressing: Simulated;+1 Total assistance Where Assessed - Lower Body Dressing: Unsupported sitting Transfers/Ambulation Related to ADLs: OOB transfer not attempted this session ADL Comments: Requires significant assist with ADLs.    OT Diagnosis:    OT Problem List: Decreased strength;Decreased activity tolerance;Impaired balance (sitting and/or standing);Decreased cognition;Obesity OT Treatment Interventions: Self-care/ADL training;Therapeutic activities;Patient/family education;Balance training   OT Goals Acute Rehab OT Goals OT Goal Formulation: With patient Time For Goal Achievement:  09/16/12 Potential to Achieve Goals: Good ADL Goals Pt Will Perform Grooming: with set-up;Sitting, chair;Sitting, edge of bed ADL Goal: Grooming - Progress: Goal set today Miscellaneous OT Goals Miscellaneous OT Goal #1: Pt will perform dynamic sitting balance task EOB with supervision >10 min in prep for EOB ADLs. OT Goal: Miscellaneous Goal #1 - Progress: Goal set today Miscellaneous OT Goal #2: Pt will perform bed mobility with max assist in prep for EOB ADLs. OT Goal: Miscellaneous Goal #2 - Progress: Goal set today Miscellaneous OT Goal #3: Pt will perform basic sit<>stand transfer with +2 assist pt 50% in prep for functional toilet transfer. OT Goal: Miscellaneous Goal #3 - Progress: Goal set today  Visit Information  Last OT Received On: 09/09/12 Assistance Needed: +2 PT/OT Co-Evaluation/Treatment: Yes    Subjective Data      Prior Functioning  Vision/Perception  Home Living Lives With: Family Available Help at Discharge: Family Type of Home: House Bathroom Shower/Tub: Nurse, adult Toilet: Standard Home Adaptive Equipment: Walker - rolling;Tub transfer bench;Bedside commode/3-in-1 Additional Comments: No family present to assist with home living information.  Prior Function Level of Independence: Needs assistance Needs Assistance: Bathing;Dressing Bath: Moderate Dressing: Moderate Comments: Pt unable to provide detailed description of PLOF, but describes herself as walking around the house with RW and sitting on seat in bathroom for showering up until a few weeks ago.   Communication Communication: No difficulties   Vision - Assessment Additional Comments: Possibly able to see difference in light and dark. Unable to determine due to cognitive deficts (pt reports she is able to see but when tested, does not demonstrate ability to see movement or distinct shapes)  Cognition  Overall Cognitive Status: No family/caregiver present to determine baseline  cognitive functioning Arousal/Alertness: Awake/alert Orientation Level: Appears intact for tasks assessed Behavior During Session: [facility] for tasks performed Cognition -  Other Comments: Pt very talkative throughout session but required cueing to redirect to answering therapist questions.  Increased time to process questions/commands.  Pt does report she has been seeing tall angels standing in her room.    Extremity/Trunk Assessment Right Upper Extremity Assessment RUE ROM/Strength/Tone: [facility] for tasks assessed Left Upper Extremity Assessment LUE ROM/Strength/Tone: WFL for tasks assessed   Mobility  Shoulder Instructions  Bed Mobility Bed Mobility: Rolling Right;Rolling Left;Right Sidelying to Sit;Sitting - Scoot to [place] of Bed;Sit to Sidelying Right Rolling Right: 1: +2 Total assist;With rail Rolling Right: Patient Percentage: 30% Rolling Left: 1: +2 Total assist;With rail Rolling Left: Patient Percentage: 30% Right Sidelying to Sit: 1: +2 Total assist;With rails;HOB flat Right Sidelying to Sit: Patient Percentage: 20% Details for Bed Mobility Assistance: +2 assist to support trunk and LEs in/out of bed. Hand over hand as well as verbal cueing for hand placement and sequencing. Transfers Transfers: Not assessed       Exercise     Balance Balance Balance Assessed: Yes Static Sitting Balance Static Sitting - Balance Support: Left upper extremity supported Static Sitting - Level of Assistance: 3: Mod assist;5: Stand by assistance Static Sitting - Comment/# of Minutes: Pt sat EOB for 10 min.  Initially pt required intermittent mod assist due to right lateral lean.  Pt able to obtain midline position with mod tactile/manual cueing to left elbow. Towards end of sitting balance task, pt able to maintain midline position and balance with stand by assist.   End of Session OT - End of Session Activity Tolerance: Patient tolerated treatment well Patient left: in bed;with call bell/phone within  reach Nurse Communication: Mobility status;Need for lift equipment  GO   09/09/2012 Cipriano Mile OTR/L Pager [phone] Office [phone]  Cipriano Mile 09/09/2012, 4:33 PM

## 2012-09-09 NOTE — Progress Notes (Signed)
Subjective: Patient feeling well this morning. Reported to have visual hallucinations yesterday evening, but it sounds as if she might have been confused about who was in her room secondary to her poor eyesight.   No interval events. Patient afebrile in interval, with Tmax = 100.1 (this AM).  Objective: Vital signs in last 24 hours: Filed Vitals:   09/08/12 2038 09/08/12 2357 09/09/12 0623 09/09/12 1037  BP: 91/60  110/60 102/58  Pulse: 98 87 88 90  Temp: 98.9 F (37.2 C)  98.7 F (37.1 C) 100.1 F (37.8 C)  TempSrc: Oral  Oral Oral  Resp: 18 16 20 20   Height:      Weight:      SpO2: 90% 98% 93% 94%   Weight change:   Intake/Output Summary (Last 24 hours) at 09/09/12 1134 Last data filed at 09/09/12 0900  Gross per 24 hour  Intake   1390 ml  Output    140 ml  Net   1250 ml   Physical Exam: General: Awake and alert; NAD CV: Regular rate and rhythm Resp: Clear to auscultation bilaterally GI/Abd: Soft, non-tender, non-distended; normoactive bowel sounds Ext: 1+ pitting edema bilaterally; no clubbing or cyanosis Skin: Warm, dry, intact Neuro: Oriented x 3.  Lab Results: Basic Metabolic Panel:  Lab 09/08/12 9604 09/07/12 0605  NA 139 136  K 4.8 5.0  CL 106 107  CO2 20 20  GLUCOSE 124* 86  BUN 20 30*  CREATININE 1.12* 1.34*  CALCIUM 9.2 9.0  MG -- 1.9  PHOS -- 3.3   Liver Function Tests:  Lab 09/08/12 0530 09/06/12 0411  AST 25 29  ALT 15 16  ALKPHOS 113 94  BILITOT 0.6 1.1  PROT 7.2 7.2  ALBUMIN 2.4* 2.5*   CBC:  Lab 09/07/12 1551 09/07/12 0605  WBC 9.6 9.7  NEUTROABS 7.7 --  HGB 11.7* 11.2*  HCT 35.0* 34.3*  MCV 91.4 92.0  PLT 231 206   Cardiac Enzymes:  Lab 09/07/12 1818 09/07/12 1210 09/07/12 0605  CKTOTAL -- -- --  CKMB -- -- --  CKMBINDEX -- -- --  TROPONINI <0.30 <0.30 <0.30   BNP: No results found for this basename: PROBNP:3 in the last 168 hours D-Dimer: No results found for this basename: DDIMER:2 in the last 168  hours CBG:  Lab 09/09/12 1052 09/09/12 0630 09/08/12 2106 09/08/12 2002 09/08/12 1707 09/08/12 1212  GLUCAP 134* 96 129* 129* 138* 175*   Coagulation:  Lab 09/06/12 0411  LABPROT 15.8*  INR 1.23   Urinalysis:  Lab 09/06/12 0501  COLORURINE ORANGE*  LABSPEC 1.020  PHURINE 5.0  GLUCOSEU NEGATIVE  HGBUR NEGATIVE  BILIRUBINUR MODERATE*  KETONESUR 15*  PROTEINUR NEGATIVE  UROBILINOGEN 4.0*  NITRITE NEGATIVE  LEUKOCYTESUR TRACE*   Studies/Results: Mr Brain Wo Contrast  09/08/2012  *RADIOLOGY REPORT*  Clinical Data: Diabetic hypertensive with acute encephalopathy/altered mental status.  MRI HEAD WITHOUT CONTRAST  Technique:  Multiplanar, multiecho pulse sequences of the brain and surrounding structures were obtained according to standard protocol without intravenous contrast.  Comparison: 09/06/2012 CT.  No comparison MR.  Findings: Motion degraded exam.  The patient had difficulty holding still.  No acute infarct.  Global atrophy without hydrocephalus.  Moderate small vessel disease type changes.  No intracranial mass lesion detected on this unenhanced exam.  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  Postsurgical changes left globe.  Slightly heterogeneous appearance of bone marrow of clivus probably normal.  If the patient develops any symptoms referable to the clivus then this  can be evaluated on follow-up.  Cervical spondylotic changes and spinal stenosis suspected although incompletely evaluated.  Major intracranial vascular structures appear to be patent.  Minimal paranasal sinus mucosal thickening.  IMPRESSION: Motion degraded exam.  No acute infarct.  Small vessel disease type changes.  Atrophy.   Original Report Authenticated By: Fuller Canada, M.D.    Medications: I have reviewed the patient's current medications. Scheduled Meds:   . antiseptic oral rinse  15 mL Mouth Rinse q12n4p  . aspirin EC  81 mg Oral Daily  . brimonidine  1 drop Both Eyes Daily  . carvedilol  3.125 mg Oral BID WC  .  cefTRIAXone (ROCEPHIN)  IV  2 g Intravenous Q24H  . docusate sodium  100 mg Oral BID  . heparin subcutaneous  5,000 Units Subcutaneous Q8H  . insulin aspart  0-15 Units Subcutaneous TID AC & HS  . pantoprazole  40 mg Oral Q1200  . senna  1 tablet Oral BID  . DISCONTD: insulin aspart  0-15 Units Subcutaneous Q4H  . DISCONTD: pantoprazole (PROTONIX) IV  40 mg Intravenous Q24H   Continuous Infusions:   . DISCONTD: sodium chloride 50 mL/hr at 09/08/12 0056   PRN Meds:.acetaminophen, ALPRAZolam Assessment/Plan:   Pt is a 76 y.o. F who presented with AMS and hypotension, initially requiring ICU admission. The patient improved within 48 hours, and patient was transferred to the floor. Etiology still unclear; infectious workup negative thus far.  AMS - seems to be improved when comparing with previous notes and nursing reports. Etiology still undetermined. Blood cultures x 4 NGTD. Ur cx no growth. Mild fever yesterday to 101.7, but afebrile since.  - continue to monitor - check TSH   AKI on CKD III - Resolved. Patient had decreased PO intake prior to admission, with apparent continued usage of home lasix.  Type 2 DM - Continue to hold metformin and glipizide. Depending on patient's clinical course consider restarting glipizide. Continue sliding scale insulin.   Obstructive sleep apnea - Stable. Continue CPAP at each bedtime as per respiratory if the patient uses CPAP at home.   Hypertension - Presented with HYPOtension. Cautiously re-introducing home anti-HTN regimen. BPs low-normal this AM.  Hyperlipidemia - Patient on crestor 10mg  as outpatient. Starting statin equivalent (atorvastatin 20mg ). Will resume home med at time of discharge.   Glaucoma/Cataracts/Blindness - continue Brimonidine.   Coronary artery disease continue aspirin, b-blocker.  Generalized weakness - PT/OT consult.   Prophylaxis - heparin  Dispo - patient admits she has great difficulty with mobility secondary to  pain in her feet and also following her recent illness. Would likely be more appropriate for discharge to SNF for rehabilitation rather than home with family. SW on board.     LOS: 3 days   Jeri Rawlins R 09/09/2012, 11:34 AM

## 2012-09-10 DIAGNOSIS — N390 Urinary tract infection, site not specified: Secondary | ICD-10-CM | POA: Diagnosis present

## 2012-09-10 LAB — GLUCOSE, CAPILLARY: Glucose-Capillary: 142 mg/dL — ABNORMAL HIGH (ref 70–99)

## 2012-09-10 MED ORDER — CARVEDILOL 6.25 MG PO TABS
6.2500 mg | ORAL_TABLET | Freq: Two times a day (BID) | ORAL | Status: DC
  Administered 2012-09-11 – 2012-09-12 (×3): 6.25 mg via ORAL
  Filled 2012-09-10 (×5): qty 1

## 2012-09-10 NOTE — Procedures (Signed)
EEG NUMBER:  [id number].  REFERRING PHYSICIAN:  Dr. Betti Cruz.  INDICATION FOR STUDY:  Acute mental status change with delirium and confusion.  Study is being performed to assess severity of encephalopathy as well as to rule out possible focal seizure activity.  DESCRIPTION:  This is a routine EEG recording performed during wakefulness.  Predominant background activity consists of low amplitude, diffuse 1-2 Hz symmetrical continuous delta activity with superimposed 7 Hz theta activity, which was recorded diffusely but most prominent centrally.  Photic stimulation produced a minimal bilateral occipital driving response.  Hyperventilation was not performed.  There were no areas of disproportionate focal slowing of cerebral activity.  No seizure discharges were recorded.  INTERPRETATION:  This EEG shows mild nonspecific continuous generalized slowing of cerebral activity.  This pattern of slowing can be seen with degenerative and metabolic encephalopathy as well as with medications. No evidence of an epileptic disorder was demonstrated.     [name] Christmas, MD    [id number] D:  09/09/2012 10:24:06  T:  09/10/2012 06:46:09  Job #:  [id number]

## 2012-09-10 NOTE — Progress Notes (Addendum)
Subjective: Still mildly febrile, daughter [name] at bedside  Objective: Vital signs in last 24 hours: Filed Vitals:   09/09/12 2159 09/10/12 0431 09/10/12 0615 09/10/12 0700  BP: 148/63 109/74    Pulse: 96 98    Temp: 99.5 F (37.5 C) 101 F (38.3 C) 100.5 F (38.1 C) 99.5 F (37.5 C)  TempSrc: Oral Oral Oral Oral  Resp: 20 21    Height:      Weight:  259 lb 4.2 oz (117.6 kg)    SpO2: 94% 96%     Weight change:   Intake/Output Summary (Last 24 hours) at 09/10/12 1610 Last data filed at 09/10/12 0435  Gross per 24 hour  Intake    650 ml  Output   3195 ml  Net  -2545 ml   Physical Exam: General: Awake and alert; NAD CV: Regular rate and rhythm Resp: Clear to auscultation bilaterally GI/Abd: Soft, non-tender, non-distended; normoactive bowel sounds Ext: 1+ pitting edema bilaterally; no clubbing or cyanosis Skin: Warm, dry, intact Neuro: Oriented x 3.  Lab Results: Basic Metabolic Panel:  Lab 09/08/12 9604 09/07/12 0605  NA 139 136  K 4.8 5.0  CL 106 107  CO2 20 20  GLUCOSE 124* 86  BUN 20 30*  CREATININE 1.12* 1.34*  CALCIUM 9.2 9.0  MG -- 1.9  PHOS -- 3.3   Liver Function Tests:  Lab 09/08/12 0530 09/06/12 0411  AST 25 29  ALT 15 16  ALKPHOS 113 94  BILITOT 0.6 1.1  PROT 7.2 7.2  ALBUMIN 2.4* 2.5*   CBC:  Lab 09/07/12 1551 09/07/12 0605  WBC 9.6 9.7  NEUTROABS 7.7 --  HGB 11.7* 11.2*  HCT 35.0* 34.3*  MCV 91.4 92.0  PLT 231 206   Cardiac Enzymes:  Lab 09/07/12 1818 09/07/12 1210 09/07/12 0605  CKTOTAL -- -- --  CKMB -- -- --  CKMBINDEX -- -- --  TROPONINI <0.30 <0.30 <0.30   CBG:  Lab 09/10/12 0601 09/09/12 2027 09/09/12 1631 09/09/12 1052 09/09/12 0630 09/08/12 2106  GLUCAP 142* 112* 146* 134* 96 129*   Coagulation:  Lab 09/06/12 0411  LABPROT 15.8*  INR 1.23   Urinalysis:  Lab 09/09/12 1741 09/06/12 0501  COLORURINE YELLOW ORANGE*  LABSPEC 1.016 1.020  PHURINE 6.0 5.0  GLUCOSEU NEGATIVE NEGATIVE  HGBUR TRACE* NEGATIVE    BILIRUBINUR NEGATIVE MODERATE*  KETONESUR NEGATIVE 15*  PROTEINUR NEGATIVE NEGATIVE  UROBILINOGEN 1.0 4.0*  NITRITE NEGATIVE NEGATIVE  LEUKOCYTESUR NEGATIVE TRACE*   Studies/Results: Mr Brain Wo Contrast  09/08/2012  *RADIOLOGY REPORT*  Clinical Data: Diabetic hypertensive with acute encephalopathy/altered mental status.  MRI HEAD WITHOUT CONTRAST  Technique:  Multiplanar, multiecho pulse sequences of the brain and surrounding structures were obtained according to standard protocol without intravenous contrast.  Comparison: 09/06/2012 CT.  No comparison MR.  Findings: Motion degraded exam.  The patient had difficulty holding still.  No acute infarct.  Global atrophy without hydrocephalus.  Moderate small vessel disease type changes.  No intracranial mass lesion detected on this unenhanced exam.  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  Postsurgical changes left globe.  Slightly heterogeneous appearance of bone marrow of clivus probably normal.  If the patient develops any symptoms referable to the clivus then this can be evaluated on follow-up.  Cervical spondylotic changes and spinal stenosis suspected although incompletely evaluated.  Major intracranial vascular structures appear to be patent.  Minimal paranasal sinus mucosal thickening.  IMPRESSION: Motion degraded exam.  No acute infarct.  Small vessel disease type changes.  Atrophy.   Original Report Authenticated By: [name], M.D.    Medications: I have reviewed the patient's current medications. Scheduled Meds:    . antiseptic oral rinse  15 mL Mouth Rinse q12n4p  . aspirin EC  81 mg Oral Daily  . atorvastatin  20 mg Oral q1800  . brimonidine  1 drop Both Eyes Daily  . carvedilol  3.125 mg Oral BID WC  . cefTRIAXone (ROCEPHIN)  IV  2 g Intravenous Q24H  . docusate sodium  100 mg Oral BID  . heparin subcutaneous  5,000 Units Subcutaneous Q8H  . insulin aspart  0-15 Units Subcutaneous TID AC & HS  . pantoprazole  40 mg Oral Q1200  . senna   1 tablet Oral BID   Continuous Infusions:  PRN Meds:.acetaminophen, ALPRAZolam  Assessment/Plan: HD#4 for 76 y.o. F who presented with AMS and hypotension, initially requiring ICU admission. The patient improved within 48 hours, and patient was transferred to the floor. Etiology still unclear; infectious workup negative thus far.  Altered Mental Status -  Etiology unclear, improved. Blood cultures x 4 NGTD. UCx ngtd. Continues to be mildly febrile, TSH wnl, EEG w/o evidence of epileptic d/o, mild nonspecific continuous generalized slowing of cerebral activity c/w degenerative and metabolic encephalopathy as well as with medications. - continue to monitor  Acute Kidney Insufficiency - Resolved. Likely prerenal given decreased po intake in setting of lasix use  Type 2 Diabetes Mellitus -  Depending on patient's clinical course consider restarting glipizide.  -Continue sliding scale insulin.  -Continue to hold metformin and glipizide.  Obstructive sleep apnea - Stable.  -Continue CPAP at each bedtime as per respiratory if the patient uses CPAP at home.   Hypertension - Presented with HYPOtension. Cautiously re-introducing home anti-HTN regimen. At goal this morning bp 109/74 pulse 98 bpm on carvedilol 3.125 bid  Hyperlipidemia - on crestor 10mg  as outpatient.  -continue statin equivalent (atorvastatin 20mg )  Glaucoma/Cataracts/Blindness - continue Brimonidine. Hold other eye drops for now as pt daughter reports hallucinations  Coronary artery disease continue aspirin, b-blocker.  Generalized weakness - needs cont PT/OT in SNF,  pt states that she will participate with Physical Therapy and will "give it a try" later today -cont PT/OT  Prophylaxis - heparin  Dispo - awaiting SNF placement     LOS: 4 days   [name] 09/10/2012, 8:26 AM

## 2012-09-11 LAB — GLUCOSE, CAPILLARY
Glucose-Capillary: 147 mg/dL — ABNORMAL HIGH (ref 70–99)
Glucose-Capillary: 162 mg/dL — ABNORMAL HIGH (ref 70–99)

## 2012-09-11 NOTE — Progress Notes (Signed)
Subjective: Still mildly febrile, family members visiting (son and granddaughter?)  Objective: Vital signs in last 24 hours: Filed Vitals:   09/10/12 0700 09/10/12 1419 09/10/12 2204 09/11/12 0514  BP:  178/75 146/60 119/53  Pulse:  98 90 82  Temp: 99.5 F (37.5 C) 97.1 F (36.2 C) 98.4 F (36.9 C) 99.5 F (37.5 C)  TempSrc: Oral Oral Oral Oral  Resp:  20 18 18   Height:      Weight:    258 lb 2.5 oz (117.1 kg)  SpO2:  94% 96% 92%   Weight change: -1 lb 1.6 oz (-0.5 kg)  Intake/Output Summary (Last 24 hours) at 09/11/12 1132 Last data filed at 09/11/12 1020  Gross per 24 hour  Intake   1220 ml  Output    900 ml  Net    320 ml   Physical Exam: General: Awake and alert; NAD CV: Regular rate and rhythm Resp: Clear to auscultation bilaterally GI/Abd: Soft, non-tender, non-distended; normoactive bowel sounds Ext: 1+ pitting edema bilaterally; no clubbing or cyanosis Skin: Warm, dry, intact Neuro: Oriented x 3.  Lab Results: Basic Metabolic Panel:  Lab 09/08/12 1914 09/07/12 0605  NA 139 136  K 4.8 5.0  CL 106 107  CO2 20 20  GLUCOSE 124* 86  BUN 20 30*  CREATININE 1.12* 1.34*  CALCIUM 9.2 9.0  MG -- 1.9  PHOS -- 3.3   Liver Function Tests:  Lab 09/08/12 0530 09/06/12 0411  AST 25 29  ALT 15 16  ALKPHOS 113 94  BILITOT 0.6 1.1  PROT 7.2 7.2  ALBUMIN 2.4* 2.5*   CBC:  Lab 09/07/12 1551 09/07/12 0605  WBC 9.6 9.7  NEUTROABS 7.7 --  HGB 11.7* 11.2*  HCT 35.0* 34.3*  MCV 91.4 92.0  PLT 231 206   Cardiac Enzymes:  Lab 09/07/12 1818 09/07/12 1210 09/07/12 0605  CKTOTAL -- -- --  CKMB -- -- --  CKMBINDEX -- -- --  TROPONINI <0.30 <0.30 <0.30   CBG:  Lab 09/11/12 1127 09/11/12 0631 09/10/12 2143 09/10/12 1603 09/10/12 1112 09/10/12 0601  GLUCAP 147* 118* 152* 211* 148* 142*   Coagulation:  Lab 09/06/12 0411  LABPROT 15.8*  INR 1.23   Urinalysis:  Lab 09/09/12 1741 09/06/12 0501  COLORURINE YELLOW ORANGE*  LABSPEC 1.016 1.020  PHURINE  6.0 5.0  GLUCOSEU NEGATIVE NEGATIVE  HGBUR TRACE* NEGATIVE  BILIRUBINUR NEGATIVE MODERATE*  KETONESUR NEGATIVE 15*  PROTEINUR NEGATIVE NEGATIVE  UROBILINOGEN 1.0 4.0*  NITRITE NEGATIVE NEGATIVE  LEUKOCYTESUR NEGATIVE TRACE*   Studies/Results: No results found. Medications: I have reviewed the patient's current medications. Scheduled Meds:    . antiseptic oral rinse  15 mL Mouth Rinse q12n4p  . aspirin EC  81 mg Oral Daily  . atorvastatin  20 mg Oral q1800  . brimonidine  1 drop Both Eyes Daily  . carvedilol  6.25 mg Oral BID WC  . cefTRIAXone (ROCEPHIN)  IV  2 g Intravenous Q24H  . docusate sodium  100 mg Oral BID  . heparin subcutaneous  5,000 Units Subcutaneous Q8H  . insulin aspart  0-15 Units Subcutaneous TID AC & HS  . pantoprazole  40 mg Oral Q1200  . senna  1 tablet Oral BID  . DISCONTD: carvedilol  3.125 mg Oral BID WC   Continuous Infusions:  PRN Meds:.acetaminophen, ALPRAZolam  Assessment/Plan: HD#5 for 76 y.o. F who presented with AMS and hypotension, initially requiring ICU admission. The patient improved within 48 hours, and patient was transferred to the  floor. Etiology still unclear; infectious workup negative thus far.  Altered Mental Status -  Etiology unclear, improved. Blood cultures x 4 NGTD. UCx ngtd. Continues to be mildly febrile at , TSH wnl, EEG w/o evidence of epileptic d/o, mild nonspecific continuous generalized slowing of cerebral activity c/w degenerative and metabolic encephalopathy as well as with medications. - continue to monitor  Acute Kidney Insufficiency - Resolved. Likely prerenal given decreased po intake in setting of lasix use  Type 2 Diabetes Mellitus -  Depending on patient's clinical course consider restarting glipizide.  -Continue sliding scale insulin.  -Continue to hold metformin and glipizide.  Obstructive sleep apnea - Stable.  -Continue CPAP at each bedtime as per respiratory if the patient uses CPAP at home.    Hypertension - Presented with HYPOtension. Cautiously re-introducing home anti-HTN regimen. At goal this morning bp 109/74 pulse 98 bpm on carvedilol 3.125 bid  Hyperlipidemia - on crestor 10mg  as outpatient.  -continue statin equivalent (atorvastatin 20mg )  Glaucoma/Cataracts/Blindness - continue Brimonidine. Hold other eye drops for now as pt daughter reports hallucinations  Coronary artery disease continue aspirin, b-blocker.  Generalized weakness - needs cont PT/OT in SNF,  pt states that she will participate with Physical Therapy and will "give it a try" later today -cont PT/OT  Prophylaxis - heparin  Dispo - awaiting SNF placement     LOS: 5 days   [name] 09/11/2012, 11:32 AM

## 2012-09-11 NOTE — Progress Notes (Signed)
Internal Medicine Attending  Date: 09/11/2012  Patient name: [name] Medical record number: [id number] Date of birth: [birth year] Age: 76 y.o. Gender: female  I saw and evaluated the patient. I reviewed the resident's note by Dr. Bosie Clos and I agree with the resident's findings and plans as documented in her note.  I am the on call attending.  [name] was transferred out of the ICU to the medicine team over the weekend.   Signed  Criselda Peaches, Jeanmarie Mccowen

## 2012-09-12 ENCOUNTER — Inpatient Hospital Stay (HOSPITAL_COMMUNITY): Payer: Medicare Other

## 2012-09-12 LAB — CULTURE, BLOOD (ROUTINE X 2): Culture: NO GROWTH

## 2012-09-12 LAB — BASIC METABOLIC PANEL
CO2: 26 mEq/L (ref 19–32)
Calcium: 9.2 mg/dL (ref 8.4–10.5)
Creatinine, Ser: 1.06 mg/dL (ref 0.50–1.10)
GFR calc non Af Amer: 51 mL/min — ABNORMAL LOW (ref >90)

## 2012-09-12 LAB — GLUCOSE, CAPILLARY
Glucose-Capillary: 108 mg/dL — ABNORMAL HIGH (ref 70–99)
Glucose-Capillary: 131 mg/dL — ABNORMAL HIGH (ref 70–99)
Glucose-Capillary: 136 mg/dL — ABNORMAL HIGH (ref 70–99)

## 2012-09-12 LAB — CBC
MCH: 30.4 pg (ref 26.0–34.0)
MCHC: 32.9 g/dL (ref 30.0–36.0)
MCV: 92.1 fL (ref 78.0–100.0)
Platelets: 315 10*3/uL (ref 150–400)
RBC: 3.69 MIL/uL — ABNORMAL LOW (ref 3.87–5.11)

## 2012-09-12 MED ORDER — POLYETHYLENE GLYCOL 3350 17 G PO PACK
17.0000 g | PACK | Freq: Every day | ORAL | Status: DC
  Administered 2012-09-12 – 2012-09-13 (×2): 17 g via ORAL
  Filled 2012-09-12 (×3): qty 1

## 2012-09-12 MED ORDER — CARVEDILOL 12.5 MG PO TABS
12.5000 mg | ORAL_TABLET | Freq: Two times a day (BID) | ORAL | Status: DC
  Administered 2012-09-12 – 2012-09-13 (×2): 12.5 mg via ORAL
  Filled 2012-09-12 (×4): qty 1

## 2012-09-12 NOTE — Progress Notes (Signed)
Asked to assist with patient with decreased LOC.  On arrival patient supine in bed.  Warm and dry.  Resp regular and unlabored.  Bil BS = clear.  Bp 155/78 SR 81 Sats 100% on RA.  CBG 136.  Sternal rub -patient no response.  Eyes not deviated.  Muscle tone strong all 4's - resists movement.  Held arm over face - moves to side before falling.  Son reports was alert earlier but after conversation with family member patient became somulent. Has had recent similar episodes with spont clearing.  Resident at bedside.  NAD.  Call as needed.

## 2012-09-12 NOTE — Progress Notes (Signed)
Pt. Refused cpap. 

## 2012-09-12 NOTE — Progress Notes (Signed)
Internal Medicine Teaching Service Attending Note Date: 09/12/2012  Patient name: [name]  Medical record number: [id number]  Date of birth: [birth year]    This patient has been seen and discussed with the house staff. Please see their note for complete details. I concur with their findings with the following additions/corrections: [name] is currently complains of constipation and is severely deconditioned. We would discontinue Foley catheter today. Put her on bowel stimulants and try to work with physical therapy. Patient is awaiting placement in a skilled nursing facility at this time. We will continue ceftriaxone for 2 more days to complete treatment for urinary tract infection.  Lars Mage 09/12/2012, 11:26 AM

## 2012-09-12 NOTE — Progress Notes (Signed)
Pt son reports pt being in deep sleep.  Nurse arrived in room to assess.  Upon arrival noted that pt appeared to be resting comfortably in bed.  Attempted to awaken pt and was unable to arouse.  Pt vitals taken and were BP155/78, pulse 81, 02 sat 100% on RA.  Rapid response RN and MD have been made aware.  Vitals rechecked in 5 mins and pt BP 156/99, pulse 82.  CBG 136.  Sternal rub performed with minimal response.  Assessed upper and lower extremities for control, pt provides some resistance against movement.  Pt slightly flinches eyes and feet in response to external stimuli. Vitals at this time BP 157/87, pulse 82.  MD currently at the bedside for further assessment.  Will continue to monitor.

## 2012-09-12 NOTE — Progress Notes (Signed)
Occupational Therapy Treatment Patient Details Name: [name] MRN: [id number] DOB: 1936/07/19 Today's Date: 09/12/2012 Time: 3664-4034 OT Time Calculation (min): 24 min  OT Assessment / Plan / Recommendation Comments on Treatment Session This 76 yo female not really making progress since eval due to decreased abillity to follow commands, fear of falling with all movements, and inability to reason with pt.    Follow Up Recommendations  Skilled nursing facility;Supervision/Assistance - 24 hour       Equipment Recommendations  Defer to next venue       Frequency Min 2X/week   Plan Discharge plan remains appropriate    Precautions / Restrictions Precautions Precautions: Fall Precaution Comments: When trying to assist pt she will almost always resist you.       ADL  Grooming: Performed;Wash/dry face;Maximal assistance (with atempt of hand over hand pt resisted) Where Assessed - Grooming: Supine, head of bed up     OT Goals ADL Goals ADL Goal: Grooming - Progress: Progressing toward goals (very slowly) Miscellaneous OT Goals OT Goal: Miscellaneous Goal #2 - Progress: Not progressing  Visit Information  Last OT Received On: 09/12/12 Assistance Needed: +2    Subjective Data  Subjective: Is there anything on this that will get in my eyes (wet washcloth that I gave her and asked her wipe her face with). Are there soldiers here, I saw soldiers (I re-oriented her and her son did also)      Cognition  Overall Cognitive Status: Impaired Area of Impairment: Following commands;Awareness of deficits;Problem solving Arousal/Alertness: Awake/alert Orientation Level: Disoriented to Behavior During Session: Anxious Following Commands: Follows one step commands inconsistently Problem Solving: Unable to do this to help with bed mobility Cognition - Other Comments: Fearful that every movement will make her fall    Mobility   Bed Mobility Bed Mobility: Rolling Right;Rolling  Left;Supine to Sit Rolling Right: 1: +2 Total assist Rolling Right: Patient Percentage: 0% (pt resisting) Rolling Left: 1: +2 Total assist Rolling Left: Patient Percentage: 0% (pt resisting)             End of Session OT - End of Session Activity Tolerance:  (limited by fear of falling and decreased cognition) Patient left: in bed;with family/visitor present (youngest son)    Evette Georges [phone] 09/12/2012, 1:17 PM

## 2012-09-12 NOTE — Progress Notes (Signed)
Physical Therapy Treatment Patient Details Name: [name] MRN: [id number] DOB: [birth year] Today's Date: 09/12/2012 Time: 0902-0926 PT Time Calculation (min): 24 min  PT Assessment / Plan / Recommendation Comments on Treatment Session  Pt requires step by step cueing for all components of mobility.  Increased anxiety & fear with mobility.   Pt's bed was positoned in chair position at end of session.     Follow Up Recommendations  Skilled nursing facility    Barriers to Discharge        Equipment Recommendations  Defer to next venue    Recommendations for Other Services    Frequency Min 3X/week   Plan Discharge plan remains appropriate    Precautions / Restrictions Precautions Precautions: Fall Restrictions Weight Bearing Restrictions: No       Mobility  Bed Mobility Bed Mobility: Rolling Right;Rolling Left;Right Sidelying to Sit;Sit to Sidelying Right;Scooting to [facility] Rolling Right: 1: +2 Total assist;With rail Rolling Right: Patient Percentage: 20% Rolling Left: 1: +2 Total assist;With rail Rolling Left: Patient Percentage: 10% Right Sidelying to Sit: 1: +2 Total assist;With rails Right Sidelying to Sit: Patient Percentage: 10% Sit to Sidelying Right: 1: +2 Total assist;HOB flat;With rail Sit to Sidelying Right: Patient Percentage: 0% Scooting to HOB: 1: +2 Total assist Scooting to [facility]: Patient Percentage: 0% Details for Bed Mobility Assistance: max directional cues & step by step cues for all components of transition.  Pt very resistive & fearful of movement.  Transfers Transfers: Not assessed      PT Goals Acute Rehab PT Goals Time For Goal Achievement: 09/23/12 Potential to Achieve Goals: Good PT Goal: Rolling Supine to Right Side - Progress: Not progressing PT Goal: Rolling Supine to Left Side - Progress: Not progressing PT Goal: Supine/Side to Sit - Progress: Not progressing PT Goal: Sit at Edge Of Bed - Progress: Progressing toward goal PT Goal:  Sit to Supine/Side - Progress: Not progressing  Visit Information  Last PT Received On: 09/12/12 Assistance Needed: +2    Subjective Data      Cognition  Overall Cognitive Status: No family/caregiver present to determine baseline cognitive functioning Arousal/Alertness: Awake/alert Orientation Level: Appears intact for tasks assessed Behavior During Session: [facility] for tasks performed Cognition - Other Comments:  (Slow to respond & follow cues)    Balance  Balance Balance Assessed: Yes Static Sitting Balance Static Sitting - Balance Support: Bilateral upper extremity supported;Feet supported Static Sitting - Level of Assistance: 5: Stand by assistance Static Sitting - Comment/# of Minutes: Pt sat EOB for ~12 minutes with SBA.  did well with sitting midline without cues.    End of Session PT - End of Session Patient left: in bed;with call bell/phone within reach;with bed alarm set Nurse Communication: Mobility status;Need for lift equipment     Verdell Face, [place] [phone] 09/12/2012

## 2012-09-12 NOTE — Progress Notes (Signed)
Subjective: Feeling well this AM. Complaining of some constipation. No other complaints.  No interval events.  Objective: Vital signs in last 24 hours: Filed Vitals:   09/11/12 0514 09/11/12 1514 09/11/12 2145 09/12/12 0438  BP: 119/53 128/66 126/85 153/47  Pulse: 82 86 88 83  Temp: 99.5 F (37.5 C) 99.1 F (37.3 C) 100.1 F (37.8 C) 97.9 F (36.6 C)  TempSrc: Oral Oral Oral Oral  Resp: 18 19 18 18   Height:      Weight: 258 lb 2.5 oz (117.1 kg)   256 lb 9.9 oz (116.4 kg)  SpO2: 92% 91% 94% 96%   Weight change: -1 lb 8.7 oz (-0.7 kg)  Intake/Output Summary (Last 24 hours) at 09/12/12 1209 Last data filed at 09/12/12 0900  Gross per 24 hour  Intake   1080 ml  Output   1675 ml  Net   -595 ml   Physical Exam:  General: Awake and alert; NAD  CV: Regular rate and rhythm  Resp: Clear to auscultation bilaterally  GI/Abd: Soft, non-tender, non-distended; normoactive bowel sounds  Ext: trace pretibial edema bilaterally; no clubbing or cyanosis  Skin: Warm, dry, intact  Neuro: Oriented x 3. Slowed mentation.   Lab Results: Basic Metabolic Panel:  Lab 09/08/12 4098 09/07/12 0605  NA 139 136  K 4.8 5.0  CL 106 107  CO2 20 20  GLUCOSE 124* 86  BUN 20 30*  CREATININE 1.12* 1.34*  CALCIUM 9.2 9.0  MG -- 1.9  PHOS -- 3.3   Liver Function Tests:  Lab 09/08/12 0530 09/06/12 0411  AST 25 29  ALT 15 16  ALKPHOS 113 94  BILITOT 0.6 1.1  PROT 7.2 7.2  ALBUMIN 2.4* 2.5*   CBC:  Lab 09/07/12 1551 09/07/12 0605  WBC 9.6 9.7  NEUTROABS 7.7 --  HGB 11.7* 11.2*  HCT 35.0* 34.3*  MCV 91.4 92.0  PLT 231 206   Cardiac Enzymes:  Lab 09/07/12 1818 09/07/12 1210 09/07/12 0605  CKTOTAL -- -- --  CKMB -- -- --  CKMBINDEX -- -- --  TROPONINI <0.30 <0.30 <0.30   CBG:  Lab 09/12/12 1127 09/12/12 0615 09/11/12 2044 09/11/12 1557 09/11/12 1127 09/11/12 0631  GLUCAP 155* 131* 108* 162* 147* 118*   Thyroid Function Tests:  Lab 09/09/12 1408  TSH 0.922  T4TOTAL --    FREET4 --  T3FREE --  THYROIDAB --   Coagulation:  Lab 09/06/12 0411  LABPROT 15.8*  INR 1.23   Urinalysis:  Lab 09/09/12 1741 09/06/12 0501  COLORURINE YELLOW ORANGE*  LABSPEC 1.016 1.020  PHURINE 6.0 5.0  GLUCOSEU NEGATIVE NEGATIVE  HGBUR TRACE* NEGATIVE  BILIRUBINUR NEGATIVE MODERATE*  KETONESUR NEGATIVE 15*  PROTEINUR NEGATIVE NEGATIVE  UROBILINOGEN 1.0 4.0*  NITRITE NEGATIVE NEGATIVE  LEUKOCYTESUR NEGATIVE TRACE*   Medications: I have reviewed the patient's current medications. Scheduled Meds:   . antiseptic oral rinse  15 mL Mouth Rinse q12n4p  . aspirin EC  81 mg Oral Daily  . atorvastatin  20 mg Oral q1800  . brimonidine  1 drop Both Eyes Daily  . carvedilol  6.25 mg Oral BID WC  . cefTRIAXone (ROCEPHIN)  IV  2 g Intravenous Q24H  . docusate sodium  100 mg Oral BID  . heparin subcutaneous  5,000 Units Subcutaneous Q8H  . insulin aspart  0-15 Units Subcutaneous TID AC & HS  . pantoprazole  40 mg Oral Q1200  . polyethylene glycol  17 g Oral Daily  . senna  1 tablet Oral  BID   Continuous Infusions:  PRN Meds:.acetaminophen, ALPRAZolam Assessment/Plan: Patient is a 76 y.o. F who presented with AMS and hypotension, initially requiring ICU admission. The patient improved within 48 hours, and patient was transferred to the floor. Etiology still unclear; infectious workup negative thus far.  Altered Mental Status - Etiology unclear. Patient more somnolent with slower mentation than on previous exams. Blood cultures x 4 NGTD. UCx ngtd. No fevers. TSH wnl, EEG w/o evidence of epileptic d/o, mild nonspecific continuous generalized slowing of cerebral activity c/w degenerative and metabolic encephalopathy as well as with medications.  - continue to monitor  - check CBC  Acute Kidney Insufficiency - Resolved. Likely prerenal given decreased po intake in setting of lasix use  - check BMET  Constipation - patient complaining of constipation this AM - start miralax -  cont colace/senna - milk, prune juice per patient request  Type 2 Diabetes Mellitus - Good glycemic control. -Continue sliding scale insulin.  -Continue to hold metformin and glipizide.   Obstructive sleep apnea - Stable.  -Continue CPAP at each bedtime as per respiratory  Hypertension - Presented with HYPOtension. Mild HTN this AM.  - inc coreg from 6.25mg  bid -> 12.5mg  bid  Hyperlipidemia - on crestor 10mg  as outpatient.  -continue statin equivalent (atorvastatin 20mg )   Glaucoma/Cataracts/Blindness - continue Brimonidine. Hold other eye drops for now as pt daughter reports hallucinations   Coronary artery disease continue aspirin, b-blocker.   Generalized weakness - PT recommending SNF with PT at that venue.  Prophylaxis - heparin   Dispo - awaiting SNF placement   LOS: 6 days   [name] R 09/12/2012, 12:09 PM

## 2012-09-12 NOTE — Progress Notes (Signed)
Clinical Social Work-CSW sent additional/updated pt/ot notes to [facility] for review and will follow for d/c planning- Jodean Lima- [phone]

## 2012-09-12 NOTE — Progress Notes (Signed)
Clinical Social Work Department CLINICAL SOCIAL WORK PLACEMENT NOTE 09/12/2012  Patient:  [name], [name]  Account Number:  [id number] Admit date:  09/06/2012  Clinical Social Worker:  Bonnye Fava, LCSW  Date/time:  09/12/2012 11:00 AM  Clinical Social Work is seeking post-discharge placement for this patient at the following level of care:   SKILLED NURSING   (*CSW will update this form in Epic as items are completed)     Patient/family provided with [facility] Department of Clinical Social Work's list of facilities offering this level of care within the geographic area requested by the patient (or if unable, by the patient's family).    Patient/family informed of their freedom to choose among providers that offer the needed level of care, that participate in Medicare, Medicaid or managed care program needed by the patient, have an available bed and are willing to accept the patient.    Patient/family informed of [facility]' ownership interest in [facility], as well as of the fact that they are under no obligation to receive care at this facility.  PASARR submitted to EDS on 09/12/2012 PASARR number received from EDS on   FL2 transmitted to all facilities in geographic area requested by pt/family on   FL2 transmitted to all facilities within larger geographic area on   Patient informed that his/her managed care company has contracts with or will negotiate with  certain facilities, including the following:     Patient/family informed of bed offers received:   Patient chooses bed at  Physician recommends and patient chooses bed at    Patient to be transferred to  on   Patient to be transferred to facility by   The following physician request were entered in Epic:   Additional Comments: CSW initiated approval with [employer]/Currently unable to fax out as TLC system down-FL2 completed and ready for bed search  Jodean Lima, [phone]

## 2012-09-13 LAB — GLUCOSE, CAPILLARY: Glucose-Capillary: 139 mg/dL — ABNORMAL HIGH (ref 70–99)

## 2012-09-13 MED ORDER — SENNA 8.6 MG PO TABS: 1.0000 | ORAL_TABLET | Freq: Two times a day (BID) | ORAL | Status: DC

## 2012-09-13 MED ORDER — DSS 100 MG PO CAPS: 100.0000 mg | ORAL_CAPSULE | Freq: Two times a day (BID) | ORAL | Status: DC

## 2012-09-13 MED ORDER — CARVEDILOL 25 MG PO TABS
25.0000 mg | ORAL_TABLET | Freq: Two times a day (BID) | ORAL | Status: DC
  Filled 2012-09-13 (×2): qty 1
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
## 2012-09-13 NOTE — Progress Notes (Signed)
Clinical Social Work-CSW facilitated pt d/c to [facility] with PTAR/Chart Copy and family at facility completing paperwork-No further needs at this time- Jodean Lima, [phone]

## 2012-09-13 NOTE — Progress Notes (Signed)
Clinical Social Work-CSW provided bed offers to pt dtr and acquired Renie Ora for pt to d/c to [facility] today- Treatment team notified-CSW will facilitate d/c as soon as d/c packet complete with summary and AVS- Jodean Lima, [phone]
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
## 2012-09-13 NOTE — Progress Notes (Signed)
IV d/c'd.  Tele d/c'd.  Pt d/c'd to SNF.  Home meds and d/c instructions have been discussed with pt and pt denies any questions or concerns at this time.  Report has been called to nurse at receiving facility.  Nurse also denies any questions or concerns at this time.  Pt leaving unit via ambulance transport and appears in no acute distress. Nino Glow RN
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
## 2012-09-14 LAB — CULTURE, BLOOD (ROUTINE X 2): Culture: NO GROWTH

## 2012-09-14 NOTE — Discharge Summary (Signed)
Patient was discharged to a SNF and will follow up with PCP after discharge from SNF or else on an as needed basis.

## 2012-09-30 IMAGING — CR DG CHEST 2V
2 series · 2 of 2 positions shown · non-contrast
Comparison: Chest 04/18/2011.

CLINICAL DATA: Diffuse body swelling.

CHEST - 2 VIEW

[w chest lat]
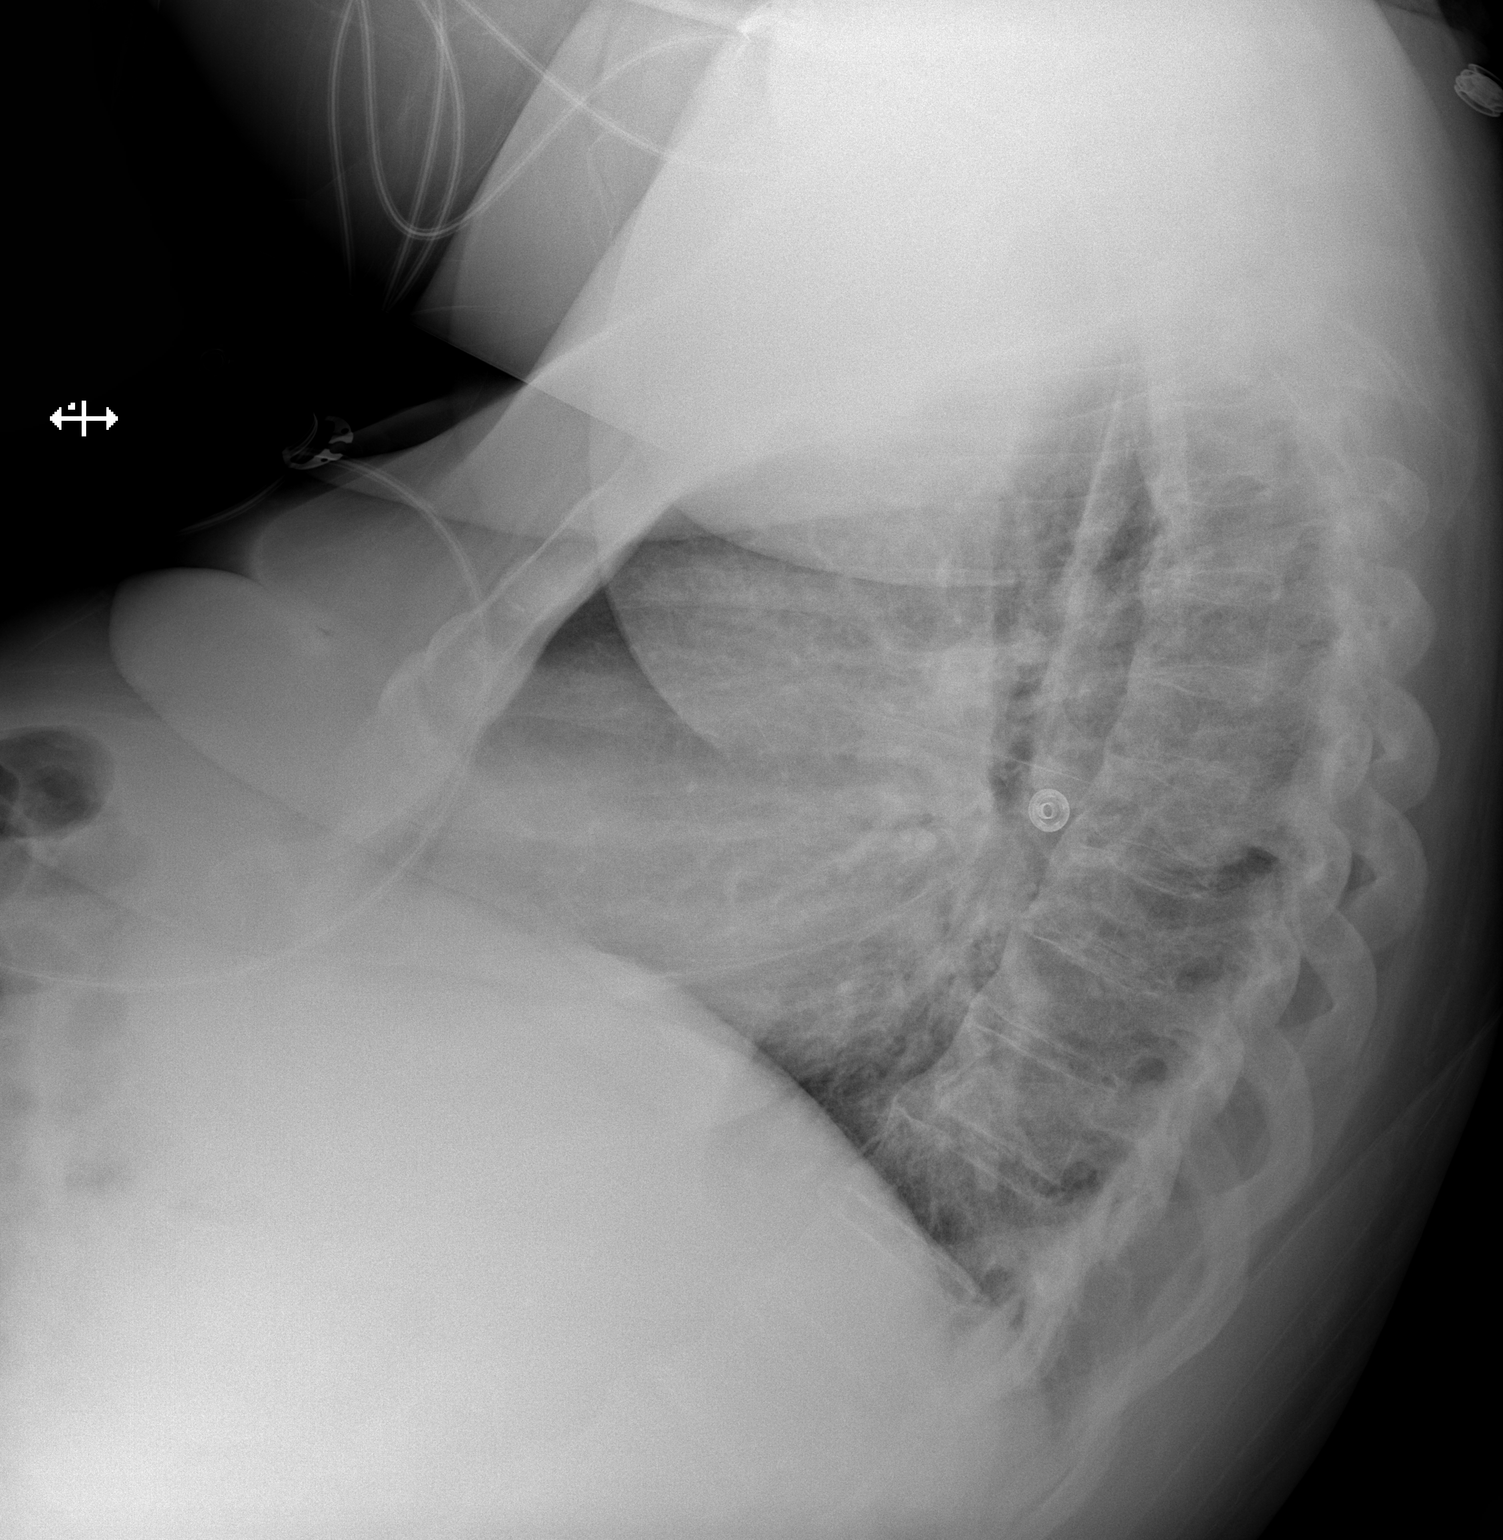

[x chest ap]
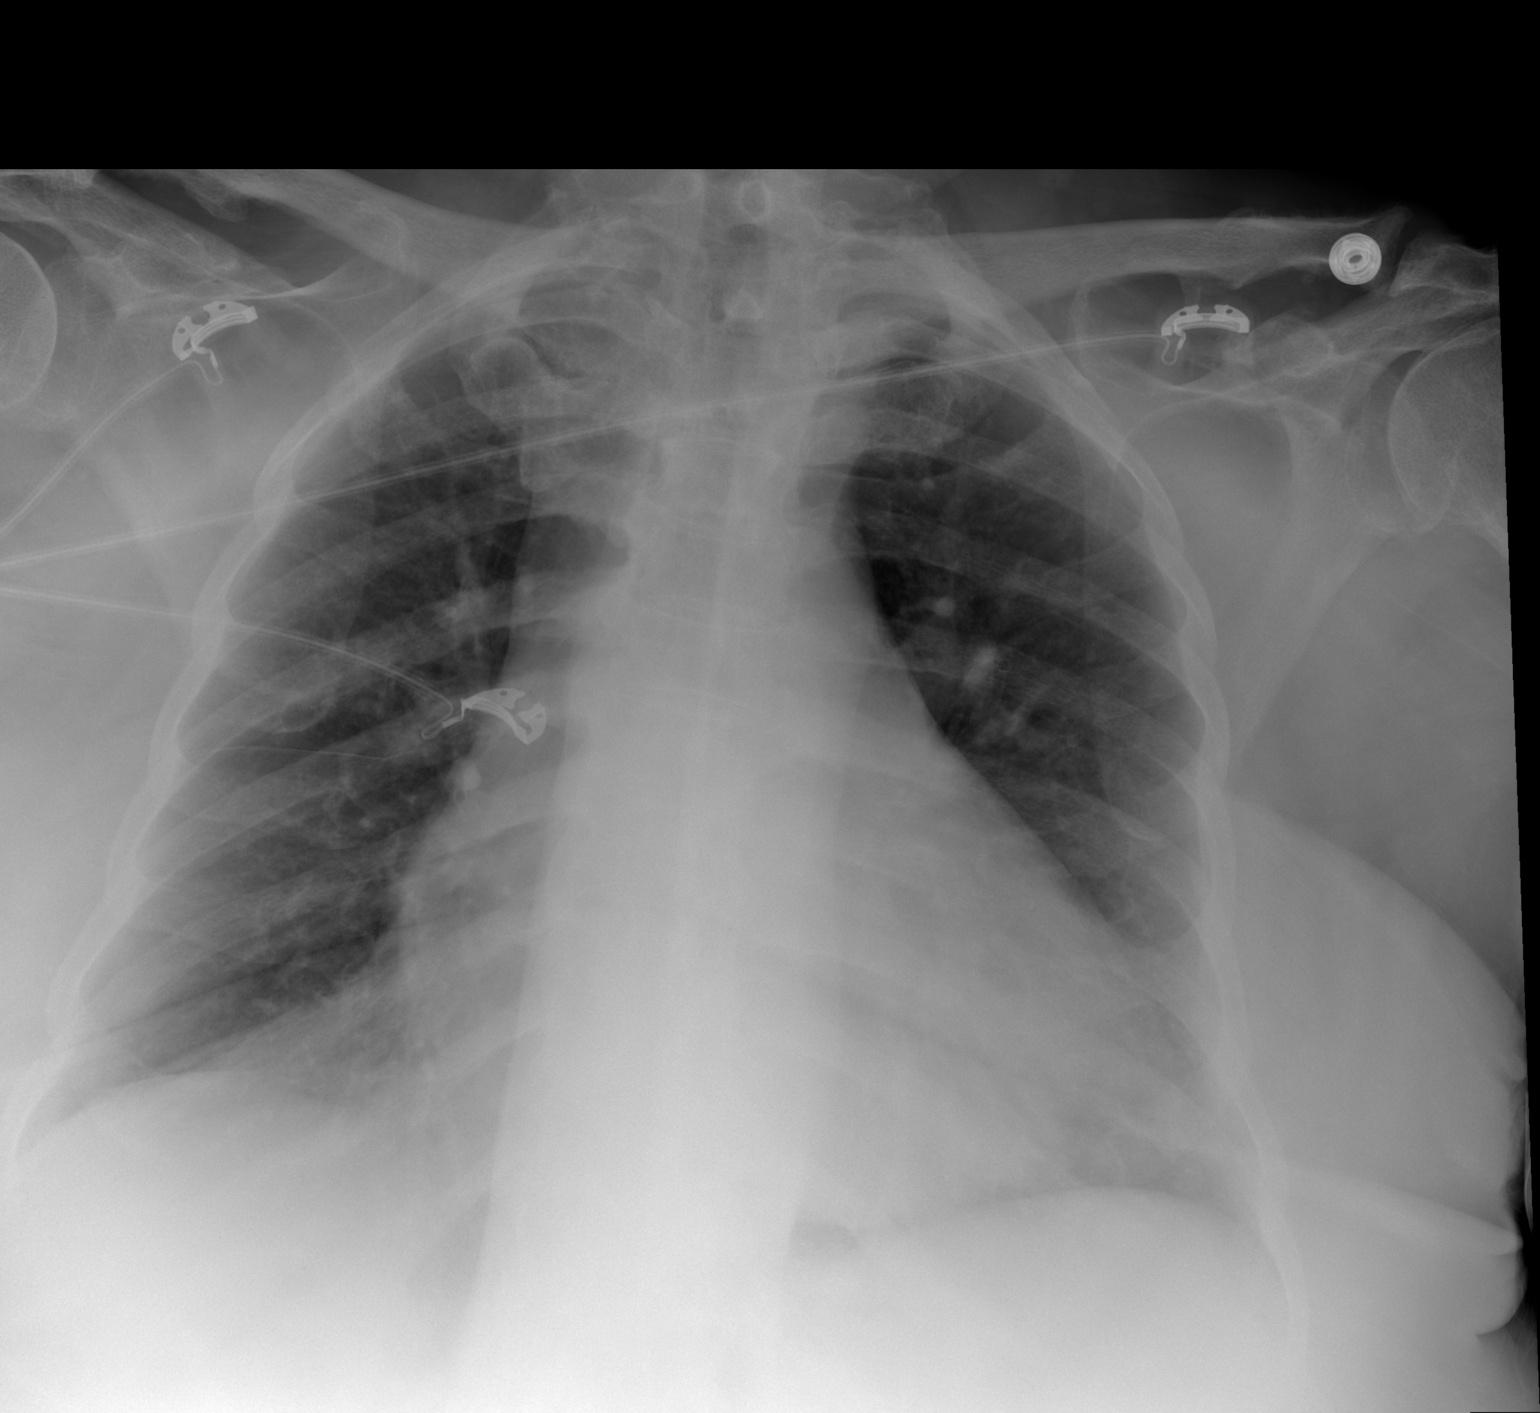

[2 of 2 positions shown; findings below may reference images not displayed]

FINDINGS: There is cardiomegaly but no pulmonary edema.  No focal
airspace disease or pleural effusion.
IMPRESSION: Cardiomegaly without acute disease.

## 2012-12-27 ENCOUNTER — Encounter (HOSPITAL_COMMUNITY): Payer: Self-pay | Admitting: Nurse Practitioner

## 2012-12-27 ENCOUNTER — Emergency Department (HOSPITAL_COMMUNITY): Payer: Medicare Other

## 2012-12-27 ENCOUNTER — Observation Stay (HOSPITAL_COMMUNITY)
Admission: EM | Admit: 2012-12-27 | Discharge: 2012-12-30 | DRG: 914 | Disposition: A | Discharge status: Skilled Nursing Facility | Payer: Medicare Other | Attending: Internal Medicine | Admitting: Internal Medicine

## 2012-12-27 DIAGNOSIS — E669 Obesity, unspecified: Secondary | ICD-10-CM | POA: Insufficient documentation

## 2012-12-27 DIAGNOSIS — I5032 Chronic diastolic (congestive) heart failure: Secondary | ICD-10-CM

## 2012-12-27 DIAGNOSIS — Y92009 Unspecified place in unspecified non-institutional (private) residence as the place of occurrence of the external cause: Secondary | ICD-10-CM | POA: Insufficient documentation

## 2012-12-27 DIAGNOSIS — I509 Heart failure, unspecified: Secondary | ICD-10-CM | POA: Insufficient documentation

## 2012-12-27 DIAGNOSIS — I428 Other cardiomyopathies: Secondary | ICD-10-CM | POA: Insufficient documentation

## 2012-12-27 DIAGNOSIS — R300 Dysuria: Secondary | ICD-10-CM | POA: Diagnosis present

## 2012-12-27 DIAGNOSIS — E785 Hyperlipidemia, unspecified: Secondary | ICD-10-CM | POA: Insufficient documentation

## 2012-12-27 DIAGNOSIS — R600 Localized edema: Secondary | ICD-10-CM | POA: Diagnosis present

## 2012-12-27 DIAGNOSIS — Z9861 Coronary angioplasty status: Secondary | ICD-10-CM | POA: Insufficient documentation

## 2012-12-27 DIAGNOSIS — E11319 Type 2 diabetes mellitus with unspecified diabetic retinopathy without macular edema: Secondary | ICD-10-CM | POA: Insufficient documentation

## 2012-12-27 DIAGNOSIS — N179 Acute kidney failure, unspecified: Secondary | ICD-10-CM | POA: Insufficient documentation

## 2012-12-27 DIAGNOSIS — G4733 Obstructive sleep apnea (adult) (pediatric): Secondary | ICD-10-CM | POA: Insufficient documentation

## 2012-12-27 DIAGNOSIS — E1139 Type 2 diabetes mellitus with other diabetic ophthalmic complication: Secondary | ICD-10-CM | POA: Insufficient documentation

## 2012-12-27 DIAGNOSIS — E119 Type 2 diabetes mellitus without complications: Secondary | ICD-10-CM

## 2012-12-27 DIAGNOSIS — W19XXXA Unspecified fall, initial encounter: Secondary | ICD-10-CM | POA: Insufficient documentation

## 2012-12-27 DIAGNOSIS — I10 Essential (primary) hypertension: Secondary | ICD-10-CM | POA: Diagnosis present

## 2012-12-27 DIAGNOSIS — Z79899 Other long term (current) drug therapy: Secondary | ICD-10-CM | POA: Insufficient documentation

## 2012-12-27 DIAGNOSIS — N183 Chronic kidney disease, stage 3 unspecified: Secondary | ICD-10-CM | POA: Insufficient documentation

## 2012-12-27 DIAGNOSIS — E113599 Type 2 diabetes mellitus with proliferative diabetic retinopathy without macular edema, unspecified eye: Secondary | ICD-10-CM | POA: Diagnosis present

## 2012-12-27 DIAGNOSIS — I2510 Atherosclerotic heart disease of native coronary artery without angina pectoris: Secondary | ICD-10-CM | POA: Insufficient documentation

## 2012-12-27 DIAGNOSIS — M7989 Other specified soft tissue disorders: Secondary | ICD-10-CM | POA: Insufficient documentation

## 2012-12-27 DIAGNOSIS — R609 Edema, unspecified: Secondary | ICD-10-CM

## 2012-12-27 DIAGNOSIS — N390 Urinary tract infection, site not specified: Secondary | ICD-10-CM | POA: Insufficient documentation

## 2012-12-27 DIAGNOSIS — Z6841 Body Mass Index (BMI) 40.0 and over, adult: Secondary | ICD-10-CM | POA: Insufficient documentation

## 2012-12-27 DIAGNOSIS — S93401A Sprain of unspecified ligament of right ankle, initial encounter: Secondary | ICD-10-CM | POA: Diagnosis present

## 2012-12-27 DIAGNOSIS — I5022 Chronic systolic (congestive) heart failure: Secondary | ICD-10-CM | POA: Insufficient documentation

## 2012-12-27 DIAGNOSIS — I129 Hypertensive chronic kidney disease with stage 1 through stage 4 chronic kidney disease, or unspecified chronic kidney disease: Secondary | ICD-10-CM | POA: Insufficient documentation

## 2012-12-27 DIAGNOSIS — S8990XA Unspecified injury of unspecified lower leg, initial encounter: Principal | ICD-10-CM | POA: Insufficient documentation

## 2012-12-27 HISTORY — DX: Gastro-esophageal reflux disease without esophagitis: K21.9

## 2012-12-27 HISTORY — DX: Visual hallucinations: R44.1

## 2012-12-27 HISTORY — DX: Type 2 diabetes mellitus with proliferative diabetic retinopathy without macular edema: E11.359

## 2012-12-27 HISTORY — DX: Obstructive sleep apnea (adult) (pediatric): G47.33

## 2012-12-27 HISTORY — DX: Chronic diastolic (congestive) heart failure: I50.32

## 2012-12-27 HISTORY — DX: Hyperlipidemia, unspecified: E78.5

## 2012-12-27 HISTORY — DX: Unspecified systolic (congestive) heart failure: I50.20

## 2012-12-27 HISTORY — DX: Chronic kidney disease, stage 3 (moderate): N18.3

## 2012-12-27 HISTORY — DX: Unspecified glaucoma: H40.9

## 2012-12-27 LAB — COMPREHENSIVE METABOLIC PANEL
BUN: 50 mg/dL — ABNORMAL HIGH (ref 6–23)
CO2: 25 mEq/L (ref 19–32)
Chloride: 99 mEq/L (ref 96–112)
Creatinine, Ser: 2.7 mg/dL — ABNORMAL HIGH (ref 0.50–1.10)
GFR calc non Af Amer: 16 mL/min — ABNORMAL LOW (ref >90)
Glucose, Bld: 138 mg/dL — ABNORMAL HIGH (ref 70–99)
Total Bilirubin: 0.2 mg/dL — ABNORMAL LOW (ref 0.3–1.2)

## 2012-12-27 LAB — CBC WITH DIFFERENTIAL/PLATELET
HCT: 29.6 % — ABNORMAL LOW (ref 36.0–46.0)
Hemoglobin: 9.5 g/dL — ABNORMAL LOW (ref 12.0–15.0)
Lymphocytes Relative: 20 % (ref 12–46)
Monocytes Absolute: 0.9 10*3/uL (ref 0.1–1.0)
Monocytes Relative: 9 % (ref 3–12)
Neutro Abs: 7.5 10*3/uL (ref 1.7–7.7)
WBC: 10.8 10*3/uL — ABNORMAL HIGH (ref 4.0–10.5)

## 2012-12-27 LAB — TROPONIN I: Troponin I: <0.3 ng/mL (ref <0.30)

## 2012-12-27 LAB — LACTIC ACID, PLASMA: Lactic Acid, Venous: 1.8 mmol/L (ref 0.5–2.2)

## 2012-12-27 MED ORDER — CARVEDILOL 25 MG PO TABS
25.0000 mg | ORAL_TABLET | Freq: Two times a day (BID) | ORAL | Status: DC
  Administered 2012-12-28 – 2012-12-30 (×5): 25 mg via ORAL
  Filled 2012-12-27 (×7): qty 1

## 2012-12-27 MED ORDER — HEPARIN BOLUS VIA INFUSION
4500.0000 [IU] | Freq: Once | INTRAVENOUS | Status: AC
  Administered 2012-12-28: 4500 [IU] via INTRAVENOUS
  Filled 2012-12-27: qty 4500

## 2012-12-27 MED ORDER — INSULIN GLARGINE 100 UNIT/ML ~~LOC~~ SOLN
10.0000 [IU] | Freq: Every day | SUBCUTANEOUS | Status: DC
  Administered 2012-12-28 – 2012-12-30 (×3): 10 [IU] via SUBCUTANEOUS

## 2012-12-27 MED ORDER — HEPARIN SODIUM (PORCINE) 5000 UNIT/ML IJ SOLN
5000.0000 [IU] | Freq: Three times a day (TID) | INTRAMUSCULAR | Status: DC
  Filled 2012-12-27 (×2): qty 1

## 2012-12-27 MED ORDER — ONDANSETRON HCL 4 MG PO TABS: 4.0000 mg | ORAL_TABLET | Freq: Four times a day (QID) | ORAL | Status: DC | PRN

## 2012-12-27 MED ORDER — SODIUM CHLORIDE 0.9 % IJ SOLN
3.0000 mL | Freq: Two times a day (BID) | INTRAMUSCULAR | Status: DC
  Administered 2012-12-27: 3 mL via INTRAVENOUS

## 2012-12-27 MED ORDER — INSULIN ASPART 100 UNIT/ML ~~LOC~~ SOLN
0.0000 [IU] | Freq: Three times a day (TID) | SUBCUTANEOUS | Status: DC
  Administered 2012-12-28: 2 [IU] via SUBCUTANEOUS
  Administered 2012-12-28 – 2012-12-29 (×2): 1 [IU] via SUBCUTANEOUS
  Administered 2012-12-29: 3 [IU] via SUBCUTANEOUS
  Administered 2012-12-30: 2 [IU] via SUBCUTANEOUS

## 2012-12-27 MED ORDER — ATORVASTATIN CALCIUM 20 MG PO TABS
20.0000 mg | ORAL_TABLET | Freq: Every day | ORAL | Status: DC
  Administered 2012-12-28 – 2012-12-29 (×2): 20 mg via ORAL
  Filled 2012-12-27 (×3): qty 1

## 2012-12-27 MED ORDER — ACETAMINOPHEN 650 MG RE SUPP: 650.0000 mg | Freq: Four times a day (QID) | RECTAL | Status: DC | PRN

## 2012-12-27 MED ORDER — INSULIN ASPART 100 UNIT/ML ~~LOC~~ SOLN: 0.0000 [IU] | Freq: Every day | SUBCUTANEOUS | Status: DC

## 2012-12-27 MED ORDER — SODIUM CHLORIDE 0.9 % IJ SOLN
3.0000 mL | Freq: Two times a day (BID) | INTRAMUSCULAR | Status: DC
  Administered 2012-12-28 – 2012-12-30 (×4): 3 mL via INTRAVENOUS

## 2012-12-27 MED ORDER — ONDANSETRON HCL 4 MG/2ML IJ SOLN: 4.0000 mg | Freq: Four times a day (QID) | INTRAMUSCULAR | Status: DC | PRN

## 2012-12-27 MED ORDER — HEPARIN (PORCINE) IN NACL 100-0.45 UNIT/ML-% IJ SOLN
1500.0000 [IU]/h | INTRAMUSCULAR | Status: DC
  Administered 2012-12-28: 1500 [IU]/h via INTRAVENOUS
  Filled 2012-12-27 (×3): qty 250

## 2012-12-27 MED ORDER — SODIUM CHLORIDE 0.9 % IJ SOLN: 3.0000 mL | INTRAMUSCULAR | Status: DC | PRN

## 2012-12-27 MED ORDER — ACETAMINOPHEN 325 MG PO TABS
650.0000 mg | ORAL_TABLET | Freq: Four times a day (QID) | ORAL | Status: DC | PRN
  Administered 2012-12-28 – 2012-12-29 (×2): 650 mg via ORAL
  Filled 2012-12-27 (×2): qty 2

## 2012-12-27 MED ORDER — ASPIRIN EC 81 MG PO TBEC
81.0000 mg | DELAYED_RELEASE_TABLET | Freq: Every day | ORAL | Status: DC
  Administered 2012-12-28: 81 mg via ORAL
  Filled 2012-12-27: qty 1

## 2012-12-27 MED ORDER — SODIUM CHLORIDE 0.9 % IV SOLN
INTRAVENOUS | Status: DC
  Administered 2012-12-27 – 2012-12-28 (×2): via INTRAVENOUS

## 2012-12-27 MED ORDER — BRIMONIDINE TARTRATE 0.2 % OP SOLN
1.0000 [drp] | Freq: Every day | OPHTHALMIC | Status: DC
  Administered 2012-12-28 – 2012-12-30 (×3): 1 [drp] via OPHTHALMIC
  Filled 2012-12-27: qty 5

## 2012-12-27 MED ORDER — SODIUM CHLORIDE 0.9 % IV SOLN: 250.0000 mL | INTRAVENOUS | Status: DC | PRN

## 2012-12-27 NOTE — Progress Notes (Signed)
ANTICOAGULATION CONSULT NOTE - Initial Consult  Pharmacy Consult for heparin Indication: R/o DVT  Allergies  Allergen Reactions  . Lisinopril     REACTION: cough    Patient Measurements:   Heparin Dosing Weight: 86 kg   Vital Signs: Temp: 98.4 F (36.9 C) (01/07 1746) Temp src: Oral (01/07 1746) BP: 131/44 mmHg (01/07 2145) Pulse Rate: 84  (01/07 2145)  Labs:  Basename 12/27/12 1750  HGB 9.5*  HCT 29.6*  PLT 233  APTT --  LABPROT 14.5  INR 1.15  HEPARINUNFRC --  CREATININE 2.70*  CKTOTAL --  CKMB --  TROPONINI <0.30    The CrCl is unknown because both a height and weight (above a minimum accepted value) are required for this calculation.   Medical History: Past Medical History  Diagnosis Date  . Coronary artery disease     LHC 05/25/06: No renal artery stenosis, proximal LAD 40%, proximal D1 40%  . Diabetes mellitus     a1c 7.3 in 10/2011  . Hypertension   . Systolic heart failure     Echo 10/09: EF 30-35%. Echo 2/13: EF 50-55%.  Marland Kitchen NICM (nonischemic cardiomyopathy)   . Blood transfusion without reported diagnosis   . Anxiety   . Arthritis   . Chronic diastolic CHF (congestive heart failure)   . GLAUCOMA 12/06/2008    Vision Loss right eye   . 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    . Visual hallucinations 07/15/2011  . Stage III chronic kidney disease 01/10/2007  . OSA (obstructive sleep apnea) 03/04/2012  . HYPERLIPIDEMIA 12/27/2006    Medications:  Scheduled:    . aspirin EC  81 mg Oral Daily  . atorvastatin  20 mg Oral q1800  . brimonidine  1 drop Both Eyes Daily  . carvedilol  25 mg Oral BID WC  . insulin aspart  0-5 Units Subcutaneous QHS  . insulin aspart  0-9 Units Subcutaneous TID WC  . insulin glargine  10 Units Subcutaneous Daily  . sodium chloride  3 mL Intravenous Q12H  . [DISCONTINUED] heparin  5,000 Units Subcutaneous Q8H  . [DISCONTINUED] sodium chloride  3 mL Intravenous  Q12H    Assessment: 77 yo female presented with possible DVT. Pharmacy to manage IV heparin. Baseline Hgb 9.5 g/dL slightly lower from Hgb of September admit (Hgb ~ 11 g/dL) and INR 1.61.   Goal of Therapy:  Heparin level 0.3-0.7 units/ml Monitor platelets by anticoagulation protocol: Yes   Plan:  1. Heparin IV bolus 4500 units x 1, then IV infusion of 1500 units/hr.  2. Heparin level in 8 hours.  3. Daily CBC, heparin level.   Emeline Gins 12/27/2012,10:41 PM

## 2012-12-27 NOTE — ED Notes (Signed)
[name]- daughter- requesting updates. [phone]

## 2012-12-27 NOTE — ED Notes (Signed)
Per EMS: Pt from home c/o bilateral lower extremity pain x  1 wk. Edema noted to bilateral legs. Lung sounds clear. AO x 4. Hx: Diabetes , CHF. 130/60. 82. 18 RR. BG 124. 99% RA.

## 2012-12-27 NOTE — H&P (Signed)
Hospital Admission Note Date: 12/27/2012  Patient name: [name] Medical record number: [id number] Date of birth: 05-Apr-1936 Age: 77 y.o. Gender: female PCP: Genella Mech, MD   Service:  Internal Medicine Teaching Service   Attending Physician:  Dr. Debe Coder    Chief Complaint:  Ankle swelling and pain     History of Present Illness:  This is a 77 year old woman with diabetes, chronic diastolic congestive heart failure, nonischemic cardiomyopathy, hypertension, and nonobstructive coronary artery disease; presenting to the emergency department with ankle swelling and pain (right greater than left). She was recently discharged home from the rehabilitation center 5 days ago. Four days ago, she began experiencing swelling in her ankles and pain. This has progressed over the last 4 days, prompting her visit today to the emergency department. She is almost completely bed ridden; she gets out of bed into a chair for short time each day with a great deal of assistance. Walking on her feet is prohibited by this pain in her ankles. She has been taking her furosemide, 20 mg twice a day; and denies dyspnea, orthopnea, paroxysmal nocturnal dyspnea, and chest pain. She does report some dizziness when she is rolled a certain way in bed but denies orthostatic dizziness. On review of systems, she denies headaches, cold symptoms, fevers, chills, productive cough, abdominal pain, diarrhea, constipation, hematochezia, and hematuria. She does report some dysuria today.   As we were leaving the room, I asked her if she had any questions. She said that she better not say too much otherwise "they will kill me."  I asked her what she meant, and she asked me if I believed in "the aristocrats" which her family member corrected to "witchcraft".    Review of Systems:   Constitutional: Positive for malaise/fatigue. Negative for fever and chills.  HENT: Negative.   Eyes: Negative.  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   Cardiovascular: Positive for leg swelling. Negative for chest pain, palpitations, orthopnea and PND.  Gastrointestinal: Negative.  Negative for nausea, vomiting, abdominal pain, diarrhea, constipation and blood in stool.  Genitourinary: Positive for dysuria. Negative for hematuria.  Musculoskeletal: Positive for myalgias (calfs) and joint pain (Ankles).  Skin: Negative.   Neurological: Positive for dizziness. Negative for headaches.     Medical History: Past Medical History  Diagnosis Date  . Coronary artery disease     LHC 05/25/06: No renal artery stenosis, proximal LAD 40%, proximal D1 40%  . Diabetes mellitus     a1c 7.3 in 10/2011  . Hypertension   . Systolic heart failure     Echo 10/09: EF 30-35%. Echo 2/13: EF 50-55%.  Marland Kitchen NICM (nonischemic cardiomyopathy)   . Blood transfusion without reported diagnosis   . Anxiety   . Arthritis   . Chronic diastolic CHF (congestive heart failure)   . GLAUCOMA 12/06/2008    Vision Loss right eye   . 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    . Visual hallucinations 07/15/2011  . Stage III chronic kidney disease 01/10/2007  . OSA (obstructive sleep apnea) 03/04/2012  . HYPERLIPIDEMIA 12/27/2006    Surgical History: Past Surgical History  Procedure Date  . Coronary angioplasty with stent placement   . Eye surgery 2012    both eyes    Home Medications: Current Outpatient Rx  Name  Route  Sig  Dispense  Refill  . TYLENOL PO   Oral   Take by mouth.         . ASPIRIN EC 81 MG PO TBEC   Oral   Take 1 tablet (81 mg total) by mouth daily.   30 tablet   11   . BRIMONIDINE TARTRATE 0.2 % OP SOLN   Both Eyes   Place 1 drop into both eyes daily.         Marland Kitchen CARVEDILOL 25 MG PO TABS   Oral   Take 1  tablet (25 mg total) by mouth 2 (two) times daily with a meal.   60 tablet   11   . DSS 100 MG PO CAPS   Oral   Take 100 mg by mouth 2 (two) times daily.   10 capsule   0   . FUROSEMIDE 20 MG PO TABS   Oral   Take 20-40 mg by mouth 2 (two) times daily.          Marland Kitchen GLUCOSE BLOOD VI STRP   Other   1 each by Other route 2 (two) times daily.         Marland Kitchen LORAZEPAM 0.5 MG PO TABS   Oral   Take 0.25 mg by mouth every 6 (six) hours as needed. For anxiety and/or exercise.         Marland Kitchen LOSARTAN POTASSIUM 25 MG PO TABS   Oral   Take 1 tablet (25 mg total) by mouth daily.   30 tablet   11   . METFORMIN HCL PO   Oral   Take 1 tablet by mouth 2 (two) times daily.         Marland Kitchen ROSUVASTATIN CALCIUM 10 MG PO TABS   Oral   Take 1 tablet (10 mg total) by mouth daily.   30 tablet   11     Allergies: Allergies as of 12/27/2012 - Review Complete 12/27/2012  Allergen Reaction Noted  . Lisinopril  02/08/2008    Family History: Family History  Problem Relation Age of Onset  . Heart disease Maternal Grandmother   . Prostate cancer Father   . Colon cancer Maternal Aunt     Social History: Social History  . Marital Status: Widowed    Spouse Name: N/A    Number of Children: 5  . Years of Education: RN school   Occupational History  . Retired     formerly worked as a Chief Strategy Officer   Social History Main Topics  . Smoking status: Never Smoker   . Smokeless tobacco: Not on file     Comment: does have second hand smoke exposure  . Alcohol Use: No  . Drug Use: No  . Sexually Active: Not on file   Social History Narrative   Lives with son who helps with her medications.    Physical exam: Filed Vitals:   12/27/12 2015  BP: 117/61  Pulse: 83  Temp: 98.4   Resp: 21    GENERAL: obese; no acute distress HEAD: atraumatic, normocephalic EYES: pupils equal, round and reactive; sclera anicteric; normal conjunctiva EARS: canals patent and TMs normal  bilaterally NOSE/THROAT: oropharynx clear, moist mucous membranes, pink gums, dentures in place NECK: supple, no carotid bruits, thyroid normal in size and without palpable nodules, no JVD or hepatojugular reflux LYMPH: no cervical or supraclavicular lymphadenopathy LUNGS: clear to auscultation bilaterally, normal work of breathing HEART: normal rate and regular rhythm; normal S1 and S2 without S3  or S4; no murmurs, rubs, or clicks PULSES: radial and posterior tibial 2+ and symmetric ABDOMEN: soft, non-tender, normal bowel sounds, no masses or organomegaly MSK: Range of motion of left ankle is unrestricted, edema and pain restricted range of motion of right ankle, passive dorsiflexion of the right ankle elicits pain in the right calf; no venous cords palpated in the calf sore thighs; no tenderness with palpation of the caps SENSATION: Intact in the feet bilaterally SKIN: warm, dry, intact, normal turgor, no rashes EXTREMITIES: 2+ pitting edema in the right foot, 1+ pitting edema in the left foot, no edema extending above the ankles bilaterally; no clubbing or cyanosis PSYCH: patient is alert and oriented to person, place, time, and situation; mood and affect are normal and congruent; thought content may include some delusions (refers to people wanting to kill her and witchcraft) but thought process seems linear; speech is normal and non-pressured; behavior is normal     Lab results: Basic Metabolic Panel:  Basename 12/27/12 1750  NA 139  K 4.6  CL 99  CO2 25  GLUCOSE 138*  BUN 50*  CREATININE 2.70*  CALCIUM 9.9  MG --  PHOS --    Liver Function Tests:  Basename 12/27/12 1750  AST 24  ALT 22  ALKPHOS 85  BILITOT 0.2*  PROT 8.2  ALBUMIN 2.9*    CBC:  Basename 12/27/12 1750  WBC 10.8*  NEUTROABS 7.5  HGB 9.5*  HCT 29.6*  MCV 93.7  PLT 233    Cardiac Enzymes:  Basename 12/27/12 1750  CKTOTAL --  CKMB --  CKMBINDEX --  TROPONINI <0.30    BNP:  Basename  12/27/12 1750  PROBNP 3027.0*    Coagulation:  Basename 12/27/12 1750  LABPROT 14.5  INR 1.15    Imaging results: Dg Chest 2 View 12/27/2012   FINDINGS: Cardiomegaly.  No active infiltrates or failure.  No effusion or pneumothorax.  Degenerative change both shoulders. IMPRESSION: Stable exam.  Cardiomegaly without failure or infiltrates.  Lower extremity venous duplex  12/27/2012 FINDINGS: No evidence of DVT or Baker's cyst. Cannot clearly visualize femoral veins bilaterally. PRELIMINARY IMPRESSION: No evidence of DVT or Baker's cyst but cannot rule out DVT in the femoral veins.    Assessment and Plan:  1.   Foot/ankle edema and pain:  Asymmetric edema (right greater than left); calf pain with dorsiflexion of the right ankle; and absence of JVD, orthopnea, and dyspnea raise my concern for deep vein thrombosis, particularly of the right calf. Preliminary results from a venous duplex study did not demonstrate a DVT, but they could not completely visualize the femoral veins. Still, my clinical suspicion for DVT remains high. This patient has a history of heart failure, but I'm not convinced this is to blame for her symptoms. Pain, edema only of the feet, and absence of JVD and dyspnea just do not fit with an exacerbation of heart failure. Supporting this is the chest x-ray findings that demonstrate little to no pulmonary edema and no effusion. In contrast, her BNP is elevated at 3027. It was 144 and 617 in March after the resolution of acute CHF. Once on the floor, her weight was measured at 238.6 pounds; her baseline weight is around 260 pounds. This is reassuring that she is not in acute decompensated CHF. We will begin empiric treatment for deep vein thrombosis. - Daily weights and strict I&Os - Full dose heparin per pharmacy - Repeat lower extremity venous duplex  2.   Acute kidney injury  and stage III CKD:  Baseline creatinine seems to be around 1.15. Today it is up to 2.7. Her baseline  GFR is calculated to be around 50-55; this is down to 19 today. This is likely to blame for her elevated BNP. I believe this is prerenal in nature, secondary to over diuresis with furosemide at home.  We will check a fractional excretion of urea and begin IV fluids at 50 mL/hr. - FEUrea - Normal saline 50 mL/hr  3.   Dysuria:  Concerning for cystitis. We will obtain a urinalysis and then treat accordingly.  - Urinalysis   4.   Diabetes mellitus, type II:  At home, her only diabetes medicine is metformin.  Hemoglobin A1c from March 2013 was 7.7. While in the hospital, we will discontinue metformin and cover with 10 units of insulin glargine and correctional insulin aspart.  - Sensitive scale correctional insulin aspart with meals and at bedtime  - Insulin glargine, 10 units daily  - Hold metformin   5.   Obstructive sleep apnea:  CPAP at night.   6.   Hyperlipidemia:  At home, she takes rosuvastatin 10 mg daily. This represents moderate intensity statin therapy; she meets criteria for high intensity statin therapy with coronary artery disease. While in the hospital, we will treat with atorvastatin 20 mg daily.  - Atorvastatin 20 mg daily for home rosuvastatin 10 mg   7.   Hypertension:  At home, she takes carvedilol 25 mg twice a day, furosemide 20 mg twice a day, and losartan 25 mg daily. In the setting of acute kidney injury, we will discontinue her furosemide and losartan. If it turns out she is an acute decompensated CHF, we will resume treatment with furosemide.  - Continue carvedilol 25 mg twice a day  - Hold lisinopril and furosemide for now   8.   CAD and nonischemic cardiomyopathy:  Heart catheterization in 2007 demonstrated a 40% proximal LAD lesion and a 40% proximal D1 lesion. Echocardiogram from February 2013 demonstrated an ejection fraction of 50-55%, mildly dilated left ventricle with normal wall motion, normal right ventricular function, and normal pulmonary artery pressures. She  is on appropriate pharmacotherapy with aspirin, carvedilol, losartan, and rosuvastatin. We will continue treatment with aspirin, carvedilol, and a statin; but we will hold her losartan in the setting of AKI.   9.   Obesity:  Patient's BMI is 41.0 (238.6 pounds and 64 inches).  10. Prophylaxis: Full therapeutic heparin dosing  11. Disposition:  Patient's PCP is Dr. Dorise Hiss, and she will require [facility] followup. Expected length of stay is greater than 2 days. We will have physical therapy to see her; she may require another stay at rehabilitation.     Signed by:  Dorthula Rue. Earlene Plater, MD PGY-I, Internal Medicine  12/27/2012, 9:20 PM

## 2012-12-27 NOTE — Progress Notes (Signed)
*  PRELIMINARY RESULTS* Vascular Ultrasound Lower extremity venous duplex has been completed.  Preliminary findings: Bilateral:  No evidence of DVT or Baker's Cyst. Cannot clearly visualize femoral veins bilaterally, therefore cannot rule out DVT in these areas.    Farrel Demark, RDMS, RVT  12/27/2012, 6:34 PM

## 2012-12-27 NOTE — ED Notes (Signed)
MD Lockwood at bedside.  

## 2012-12-27 NOTE — ED Provider Notes (Signed)
History     CSN: [id number]  Arrival date & time 12/27/12  1727   First MD Initiated Contact with Patient 12/27/12 1727      Chief Complaint  Patient presents with  . Leg Swelling    (Consider location/radiation/quality/duration/timing/severity/associated sxs/prior treatment) HPI The patient presents with concerns of ongoing dyspnea, new lower extremity edema.  Symptoms began several days ago, insidiously.  Since onset she has noticed progressive enlargement of both lower extremities, on the right greater than the left.  There is mild associated diffuse pain.  She notes that symptoms began without clear precipitant, and since onset there been no clear alleviating or exacerbating factors. She denies new chest pain, abdominal pain, nausea, vomiting. She states that she is compliant with all medications.  Past Medical History  Diagnosis Date  . Coronary artery disease     LHC 05/25/06: No renal artery stenosis, proximal LAD 40%, proximal D1 40%  . Diabetes mellitus     a1c 7.3 in 10/2011  . Hypertension   . Chronic systolic heart failure     Echocardiogram 10/09: EF 30-35%, mild LVH, increased LV filling pressures, mild LAE  . Diabetic retinopathy(362.0)   . NICM (nonischemic cardiomyopathy)   . Blood transfusion without reported diagnosis   . Anxiety   . Arthritis     Past Surgical History  Procedure Date  . Coronary angioplasty with stent placement   . Eye surgery 2012    both eyes    Family History  Problem Relation Age of Onset  . Heart disease Maternal Grandmother   . Prostate cancer Father   . Colon cancer Maternal Aunt     History  Substance Use Topics  . Smoking status: Never Smoker   . Smokeless tobacco: Not on file     Comment: does have second hand smoke exposure  . Alcohol Use: No    OB History    Grav Para Term Preterm Abortions TAB SAB Ect Mult Living                  Review of Systems  Constitutional:       Per HPI, otherwise negative    HENT:       Per HPI, otherwise negative  Eyes: Negative.   Respiratory:       Per HPI, otherwise negative  Cardiovascular:       Per HPI, otherwise negative  Gastrointestinal: Negative for vomiting.  Genitourinary: Negative.   Musculoskeletal:       Per HPI, otherwise negative  Skin: Negative.   Neurological: Negative for syncope.    Allergies  Lisinopril  Home Medications   Current Outpatient Rx  Name  Route  Sig  Dispense  Refill  . TYLENOL PO   Oral   Take by mouth.         . ASPIRIN EC 81 MG PO TBEC   Oral   Take 1 tablet (81 mg total) by mouth daily.   30 tablet   11   . BRIMONIDINE TARTRATE 0.2 % OP SOLN   Both Eyes   Place 1 drop into both eyes daily.         Marland Kitchen CARVEDILOL 25 MG PO TABS   Oral   Take 1 tablet (25 mg total) by mouth 2 (two) times daily with a meal.   60 tablet   11   . DSS 100 MG PO CAPS   Oral   Take 100 mg by mouth 2 (two) times daily.  10 capsule   0   . FUROSEMIDE 20 MG PO TABS   Oral   Take 40 mg by mouth 2 (two) times daily.         Marland Kitchen GLUCOSE BLOOD VI STRP      Use as instructed   100 each   12   . LORAZEPAM 0.5 MG PO TABS   Oral   Take 0.25 mg by mouth every 6 (six) hours as needed. For anxiety         . LOSARTAN POTASSIUM 25 MG PO TABS   Oral   Take 1 tablet (25 mg total) by mouth daily.   30 tablet   11   . METFORMIN HCL PO   Oral   Take 1 tablet by mouth 2 (two) times daily.         Marland Kitchen ROSUVASTATIN CALCIUM 10 MG PO TABS   Oral   Take 1 tablet (10 mg total) by mouth daily.   30 tablet   11     BP 126/49  Pulse 84  Temp 98.4 F (36.9 C) (Oral)  Resp 21  SpO2 99%  Physical Exam  Nursing note and vitals reviewed. Constitutional: She has a sickly appearance.  Cardiovascular: Regular rhythm.   Murmur heard. Pulmonary/Chest: She has decreased breath sounds. She has no wheezes.  Abdominal: Soft. Normal appearance.  Musculoskeletal:       Pulse ox ovaries are grossly edematous, greater on  the right than the left.  There is no appreciable acute superficial changes.  There is no tenderness to palpation about either leg.  Distal pulses are appropriate  Psychiatric: She has a normal mood and affect. Her speech is normal and behavior is normal. Thought content normal.    ED Course  Procedures (including critical care time)   Labs Reviewed  CBC WITH DIFFERENTIAL  COMPREHENSIVE METABOLIC PANEL  TROPONIN I  URINALYSIS, ROUTINE W REFLEX MICROSCOPIC  PRO B NATRIURETIC PEPTIDE  PROTIME-INR  LACTIC ACID, PLASMA   No results found.   No diagnosis found.  Cardiac 85 sinus rhythm normal Pulse ox 99% room air normal    Date: 12/27/2012  Rate: 86  Rhythm: normal sinus rhythm  QRS Axis: normal  Intervals: normal  ST/T Wave abnormalities: nonspecific T wave changes  Conduction Disutrbances:none  Narrative Interpretation:   Old EKG Reviewed: unchanged ABNR  MDM  The patient presents with concerns of ongoing dyspnea, new lower extremity edema.  Given the asymmetric nature, the dyspnea there suspicion of DVT.  On exam the patient was medically stable.  Given her comorbidities, labs were performed.  These notable for demonstration of new renal failure, elevated BNP suggesting acute on chronic diastolic heart failure, anemia.  With the complexity of her presentation she was admitted to the teaching service for further evaluation and management.        Gerhard Munch, MD 12/27/12 2000

## 2012-12-27 NOTE — ED Notes (Signed)
Daughter at bedside expressing concern over taking care of pt. Daughter made aware of plan of care. Information provided. Pt denies any complaints at this time. Resting comfortably. Made aware of need of urine sample.

## 2012-12-27 NOTE — ED Notes (Signed)
Admitting MD at bedside.

## 2012-12-28 ENCOUNTER — Inpatient Hospital Stay (HOSPITAL_COMMUNITY): Payer: Medicare Other

## 2012-12-28 ENCOUNTER — Encounter (HOSPITAL_COMMUNITY): Payer: Self-pay | Admitting: General Practice

## 2012-12-28 DIAGNOSIS — R600 Localized edema: Secondary | ICD-10-CM | POA: Diagnosis present

## 2012-12-28 LAB — URINALYSIS, ROUTINE W REFLEX MICROSCOPIC
Glucose, UA: NEGATIVE mg/dL
Ketones, ur: NEGATIVE mg/dL
Protein, ur: 30 mg/dL — AB

## 2012-12-28 LAB — BASIC METABOLIC PANEL
BUN: 50 mg/dL — ABNORMAL HIGH (ref 6–23)
CO2: 24 mEq/L (ref 19–32)
Calcium: 9.2 mg/dL (ref 8.4–10.5)
Glucose, Bld: 95 mg/dL (ref 70–99)
Sodium: 141 mEq/L (ref 135–145)

## 2012-12-28 LAB — URINE MICROSCOPIC-ADD ON

## 2012-12-28 LAB — HEPARIN LEVEL (UNFRACTIONATED): Heparin Unfractionated: 0.36 IU/mL (ref 0.30–0.70)

## 2012-12-28 LAB — GLUCOSE, CAPILLARY
Glucose-Capillary: 97 mg/dL (ref 70–99)
Glucose-Capillary: 131 mg/dL — ABNORMAL HIGH (ref 70–99)
Glucose-Capillary: 150 mg/dL — ABNORMAL HIGH (ref 70–99)

## 2012-12-28 LAB — CBC
Hemoglobin: 8.6 g/dL — ABNORMAL LOW (ref 12.0–15.0)
MCH: 30.6 pg (ref 26.0–34.0)
MCHC: 32.8 g/dL (ref 30.0–36.0)
MCV: 93.2 fL (ref 78.0–100.0)
Platelets: 244 10*3/uL (ref 150–400)
RBC: 2.81 MIL/uL — ABNORMAL LOW (ref 3.87–5.11)

## 2012-12-28 MED ORDER — ASPIRIN EC 325 MG PO TBEC
325.0000 mg | DELAYED_RELEASE_TABLET | Freq: Every day | ORAL | Status: DC
  Filled 2012-12-28: qty 1

## 2012-12-28 MED ORDER — HEPARIN SODIUM (PORCINE) 5000 UNIT/ML IJ SOLN
5000.0000 [IU] | Freq: Three times a day (TID) | INTRAMUSCULAR | Status: DC
  Administered 2012-12-28 – 2012-12-30 (×5): 5000 [IU] via SUBCUTANEOUS
  Filled 2012-12-28 (×8): qty 1

## 2012-12-28 NOTE — Progress Notes (Addendum)
Subjective: Patient sitting in bed and eating breakfast. Still says some pain in bilateral feet, R>L.  Denies CP, SOB, N/V, dizziness, abdominal pain, diarrhea. Spoke with RN who informed me that patient's daughter has concerns about ability to care for mother at home.   Objective: Vital signs in last 24 hours: Filed Vitals:   12/27/12 2145 12/27/12 2241 12/28/12 0210 12/28/12 0547  BP: 131/44 105/67 112/69 118/73  Pulse: 84 83 79 74  Temp:  97.6 F (36.4 C) 97.8 F (36.6 C) 98.1 F (36.7 C)  TempSrc:  Oral Oral Oral  Resp:  20 20 20   Height:  5\' 4"  (1.626 m)    Weight:  238 lb 9.6 oz (108.228 kg)  237 lb 3.4 oz (107.6 kg)  SpO2: 93% 97% 95% 96%   Weight change:   Intake/Output Summary (Last 24 hours) at 12/28/12 0923 Last data filed at 12/28/12 0600  Gross per 24 hour  Intake 326.25 ml  Output      0 ml  Net 326.25 ml   Vitals reviewed. General: resting in bed, NAD HEENT: PERRL, EOMI, no scleral icterus Cardiac: RRR, no rubs, murmurs or gallops Pulm: clear to auscultation bilaterally, no wheezes, rales, or rhonchi Abd: soft, nontender, nondistended, BS present Ext: 2+ pitting edema of bilateral feet, R>L. Passive dorsiflexion of R ankle elicits pain in R ankle and toes, pt denies calf pain this morning. No cords. Edematous hands.   Neuro: alert and oriented X3, cranial nerves II-XII grossly intact  Lab Results: Basic Metabolic Panel:  Lab 12/28/12 9604 12/27/12 1750  NA 141 139  K 4.0 4.6  CL 104 99  CO2 24 25  GLUCOSE 95 138*  BUN 50* 50*  CREATININE 2.51* 2.70*  CALCIUM 9.2 9.9  MG -- --  PHOS -- --   Liver Function Tests:  Lab 12/27/12 1750  AST 24  ALT 22  ALKPHOS 85  BILITOT 0.2*  PROT 8.2  ALBUMIN 2.9*   CBC:  Lab 12/28/12 0500 12/27/12 1750  WBC 11.1* 10.8*  NEUTROABS -- 7.5  HGB 8.6* 9.5*  HCT 26.2* 29.6*  MCV 93.2 93.7  PLT 244 233   Cardiac Enzymes:  Lab 12/27/12 1750  CKTOTAL --  CKMB --  CKMBINDEX --  TROPONINI <0.30    BNP:  Lab 12/27/12 1750  PROBNP 3027.0*   D-Dimer: No results found for this basename: DDIMER:2 in the last 168 hours CBG:  Lab 12/28/12 0606 12/27/12 2236  GLUCAP 97 121*   Coagulation:  Lab 12/27/12 1750  LABPROT 14.5  INR 1.15   Studies/Results: Dg Chest 2 View  12/27/2012  *RADIOLOGY REPORT*  Clinical Data: Discomfort in chest, weakness.  Hypertension. Diabetes mellitus, renal insufficiency.  CHEST - 2 VIEW  Comparison: 09/06/2012  Findings: Cardiomegaly.  No active infiltrates or failure.  No effusion or pneumothorax.  Degenerative change both shoulders.  IMPRESSION: Stable exam.  Cardiomegaly without failure or infiltrates.   Original Report Authenticated By: Davonna Belling, M.D.    Medications: I have reviewed the patient's current medications. Scheduled Meds:   . aspirin EC  81 mg Oral Daily  . atorvastatin  20 mg Oral q1800  . brimonidine  1 drop Both Eyes Daily  . carvedilol  25 mg Oral BID WC  . insulin aspart  0-5 Units Subcutaneous QHS  . insulin aspart  0-9 Units Subcutaneous TID WC  . insulin glargine  10 Units Subcutaneous Daily  . sodium chloride  3 mL Intravenous Q12H   Continuous Infusions:   .  sodium chloride 50 mL/hr at 12/27/12 2348  . heparin 1,500 Units/hr (12/28/12 0015)   PRN Meds:.sodium chloride, acetaminophen, acetaminophen, ondansetron (ZOFRAN) IV, ondansetron, sodium chloride  Assessment/Plan: 1. Foot/ankle edema and pain: On admission, concern for possible R LE DVT given asymmetric edema (right greater than left). This morning when I saw patient, she did not have calf pain with dorsiflexion as on admission, but had significant foot and ankle pain. Also noted to have edematous hands. Dopplers from last night did not demonstrate DVT, femoral veins unable to be visualized 2/2 body habitus. Wells score low (1 for bedridden), low probability for DVT. No SOB, hypoxemia to suggest PE.  Possible stress injury responsible for R ankle/foot  pain Exacerbation of CHF is possibility, but no JVD, dyspnea, rales on exam and no pulmonary vascular congestion on CXR. She is about 22lbs down from baseline weight. Interval elevation of BNP may reflect renal failure.  - Daily weights and strict I&Os  - She received heparin drip overnight. Stopped drip this morning as suspicion for DVT lower.  - Xray bilateral feet and ankles - Increase ASA to 235    2. Acute kidney injury and stage III CKD:  Baseline Cr 1.15. Today it is up to 2.7. Her baseline GFR is calculated to be around 50-55; this is down to 19 today. This is likely to blame for her elevated BNP. Likely prerenal due to overuse and diuresis with furosemide, this is supported by elevated BUN:Cr as well as improvement in Cr from 2.7-->2.5 with gentle fluid hydration - UA is pending - FEUrea  - Normal saline 50 mL/hr   3. Dysuria:  Concerning for cystitis. We will obtain a urinalysis and then treat accordingly.  - Urinalysis   4. Diabetes mellitus, type II:  At home, her only diabetes medicine is metformin. Hemoglobin A1c from March 2013 was 7.7.  - Sensitive scale correctional insulin aspart with meals and at bedtime  - Insulin glargine, 10 units daily  - Hold metformin   5. Obstructive sleep apnea:  CPAP at night.   6. Hyperlipidemia:  On rosuvastatin 10 mg at home. Will treat with high potency statin while inpatient. - Atorvastatin 20 mg daily  7. Hypertension:  At home, she takes carvedilol 25 mg twice a day, furosemide 20 mg twice a day, and losartan 25 mg daily. In the setting of acute kidney injury, we will discontinue her furosemide and losartan. If evidence of an acute decompensated CHF, we will resume treatment with furosemide.  - Continue carvedilol 25 mg twice a day  - Hold lisinopril and furosemide for now   8. CAD and nonischemic cardiomyopathy:  Heart catheterization in 2007 demonstrated a 40% proximal LAD lesion and a 40% proximal D1 lesion. Echocardiogram  from February 2013 demonstrated an ejection fraction of 50-55%, mildly dilated left ventricle with normal wall motion, normal right ventricular function, and normal pulmonary artery pressures. She is on appropriate pharmacotherapy with aspirin, carvedilol, losartan, and rosuvastatin.   - Continue treatment with aspirin, carvedilol, and a statin; holding losartan in the setting of AKI.   9. Obesity:  Patient's BMI is 41.0 (238.6 pounds and 64 inches).   10. Prophylaxis: Full therapeutic heparin dosing   Dispo: Patient's daughter expressed to RN that she is having difficulty caring for mother in home, interested in SNF options. Will have PT/OT evaluate patient, discuss w CSW.  Disposition is deferred at this time, awaiting improvement of current medical problems.  Anticipated discharge in approximately 2 day(s).  The patient does have a current PCP Dorise Hiss, ELIZABETH, MD), therefore will be requiring OPC follow-up after discharge.   The patient does not have transportation limitations that hinder transportation to clinic appointments.  .Services Needed at time of discharge: Y = Yes, Blank = No PT:   OT:   RN:   Equipment:   Other:     LOS: 1 day   Bronson Curb 12/28/2012, 9:23 AM

## 2012-12-28 NOTE — Progress Notes (Signed)
PT Cancellation Note  Patient Details Name: [name] MRN: [id number] DOB: August 03, 1936   Cancelled Treatment:    Reason Eval/Treat Not Completed: Medical issues which prohibited therapy. PT guidelines require pt to have had heparin on board for 24 hours prior to mobility given DVT. Will f/u tomorrow.   [facility] HELEN 12/28/2012, 9:10 AM Pager: [phone]

## 2012-12-28 NOTE — Progress Notes (Signed)
ANTICOAGULATION CONSULT NOTE - Follow Up Consult  Pharmacy Consult for Heparin Indication: DVT (suspected)   Allergies  Allergen Reactions  . Lisinopril     REACTION: cough    Patient Measurements: Height: 5\' 4"  (162.6 cm) Weight: 237 lb 3.4 oz (107.6 kg) IBW/kg (Calculated) : 54.7  Heparin Dosing Weight: 80kg   Vital Signs: Temp: 98.1 F (36.7 C) (01/08 0547) Temp src: Oral (01/08 0547) BP: 118/73 mmHg (01/08 0547) Pulse Rate: 74  (01/08 0547)  Labs:  Basename 12/28/12 0859 12/28/12 0500 12/27/12 1750  HGB -- 8.6* 9.5*  HCT -- 26.2* 29.6*  PLT -- 244 233  APTT -- -- --  LABPROT -- -- 14.5  INR -- -- 1.15  HEPARINUNFRC 0.36 -- --  CREATININE -- 2.51* 2.70*  CKTOTAL -- -- --  CKMB -- -- --  TROPONINI -- -- <0.30    Estimated Creatinine Clearance: 23.6 ml/min (by C-G formula based on Cr of 2.51).   Medications:  Scheduled:    . aspirin EC  81 mg Oral Daily  . atorvastatin  20 mg Oral q1800  . brimonidine  1 drop Both Eyes Daily  . carvedilol  25 mg Oral BID WC  . [COMPLETED] heparin  4,500 Units Intravenous Once  . insulin aspart  0-5 Units Subcutaneous QHS  . insulin aspart  0-9 Units Subcutaneous TID WC  . insulin glargine  10 Units Subcutaneous Daily  . sodium chloride  3 mL Intravenous Q12H  . [DISCONTINUED] heparin  5,000 Units Subcutaneous Q8H  . [DISCONTINUED] sodium chloride  3 mL Intravenous Q12H    Assessment: 77 yo female presented with pain and edema in her lower extremities, R > L. Doppler on 1/7 showed no evidence of DVT; however, femoral artery was not clearly visualized. This AM, patient no longer had calf pain with dorsiflexion and also had some edema in her hand. The MD now has low suspicion for DVT and heparin drip was stopped.  Patient is in acute on chronic renal dysfunction. SCr 2.51 with CrCl < 52ml/min. Hemoglobin is 8.6 and platelets are 244. No overt bleeding noted.   Plan:  OFF heparin drip per MD  Micheline Chapman 12/28/2012,11:11 AM   The above patient was reviewed and discussed with Lurena Joiner, pt is now off heparin drip after low suspicion for DVT  Abran Duke, PharmD Clinical Pharmacist Phone: [phone] Pager: [phone] 12/28/2012 12:26 PM

## 2012-12-28 NOTE — H&P (Signed)
Internal Medicine Teaching Service Attending Note Date: 12/28/2012  Patient name: [name]  Medical record number: [id number]  Date of birth: 1936-12-13   I have seen and evaluated Reita Cliche and discussed their care with the Residency Team.    Ms. Valera is a 77yo woman who presented to the [facility] ED complaining of ankle swelling and pain.  She has a complicated PMH and was recently discharged from a nursing facility for rehab.  She reports to me that the pain in her ankle began about a day ago, and the swelling about 4 days ago.  She was previously active and "on her feet" at the Rehab center, but has been basically bedridden at home.  She reports some dizziness with rolling in bed, but denies chest pain, dyspnea, orthopnea, PND, chest pain, abdominal pain, diarrhea, recent illness, change in urinary habits.  She has had new dysuria today.   For further history, meds, allergies, please see resident note.   Physical Exam: Blood pressure 121/70, pulse 72, temperature 97.7 F (36.5 C), temperature source Axillary, resp. rate 20, height 5\' 4"  (1.626 m), weight 237 lb 3.4 oz (107.6 kg), SpO2 96.00%. General appearance: alert, cooperative, appears stated age and no distress Head: Normocephalic, without obvious abnormality, atraumatic Lungs: clear to auscultation bilaterally and no wheeze Heart: normal rate and rhythm, no murmur noted Extremities: + pain on dorsiflexion and inversion of right ankle.  Also some tenderness with plantarflexion and eversion, though not to the same extent.  Swelling to bilateral ankles, calves non tender and swelling does not go past ankle.  She also has some tenderness to movement of the left ankle, but it is worse on teh right.  Pulses: 2+ and symmetric Abdomen: +BS, soft, nontender  Lab results: Results for orders placed during the hospital encounter of 12/27/12 (from the past 24 hour(s))  CBC WITH DIFFERENTIAL     Status: Abnormal   Collection Time   12/27/12  5:50 PM      Component Value Range   WBC 10.8 (*) 4.0 - 10.5 K/uL   RBC 3.16 (*) 3.87 - 5.11 MIL/uL   Hemoglobin 9.5 (*) 12.0 - 15.0 g/dL   HCT 30.8 (*) 65.7 - 84.6 %   MCV 93.7  78.0 - 100.0 fL   MCH 30.1  26.0 - 34.0 pg   MCHC 32.1  30.0 - 36.0 g/dL   RDW 96.2  95.2 - 84.1 %   Platelets 233  150 - 400 K/uL   Neutrophils Relative 69  43 - 77 %   Neutro Abs 7.5  1.7 - 7.7 K/uL   Lymphocytes Relative 20  12 - 46 %   Lymphs Abs 2.2  0.7 - 4.0 K/uL   Monocytes Relative 9  3 - 12 %   Monocytes Absolute 0.9  0.1 - 1.0 K/uL   Eosinophils Relative 2  0 - 5 %   Eosinophils Absolute 0.2  0.0 - 0.7 K/uL   Basophils Relative 0  0 - 1 %   Basophils Absolute 0.0  0.0 - 0.1 K/uL  COMPREHENSIVE METABOLIC PANEL     Status: Abnormal   Collection Time   12/27/12  5:50 PM      Component Value Range   Sodium 139  135 - 145 mEq/L   Potassium 4.6  3.5 - 5.1 mEq/L   Chloride 99  96 - 112 mEq/L   CO2 25  19 - 32 mEq/L   Glucose, Bld 138 (*) 70 - 99  mg/dL   BUN 50 (*) 6 - 23 mg/dL   Creatinine, Ser 1.61 (*) 0.50 - 1.10 mg/dL   Calcium 9.9  8.4 - 09.6 mg/dL   Total Protein 8.2  6.0 - 8.3 g/dL   Albumin 2.9 (*) 3.5 - 5.2 g/dL   AST 24  0 - 37 U/L   ALT 22  0 - 35 U/L   Alkaline Phosphatase 85  39 - 117 U/L   Total Bilirubin 0.2 (*) 0.3 - 1.2 mg/dL   GFR calc non Af Amer 16 (*) >90 mL/min   GFR calc Af Amer 19 (*) >90 mL/min  TROPONIN I     Status: Normal   Collection Time   12/27/12  5:50 PM      Component Value Range   Troponin I <0.30  <0.30 ng/mL  PRO B NATRIURETIC PEPTIDE     Status: Abnormal   Collection Time   12/27/12  5:50 PM      Component Value Range   Pro B Natriuretic peptide (BNP) 3027.0 (*) 0 - 125 pg/mL  PROTIME-INR     Status: Normal   Collection Time   12/27/12  5:50 PM      Component Value Range   Prothrombin Time 14.5  11.6 - 15.2 seconds   INR 1.15  0.00 - 1.49  LACTIC ACID, PLASMA     Status: Normal   Collection Time   12/27/12  5:51 PM      Component Value Range     Lactic Acid, Venous 1.8  0.5 - 2.2 mmol/L  GLUCOSE, CAPILLARY     Status: Abnormal   Collection Time   12/27/12 10:36 PM      Component Value Range   Glucose-Capillary 121 (*) 70 - 99 mg/dL  BASIC METABOLIC PANEL     Status: Abnormal   Collection Time   12/28/12  5:00 AM      Component Value Range   Sodium 141  135 - 145 mEq/L   Potassium 4.0  3.5 - 5.1 mEq/L   Chloride 104  96 - 112 mEq/L   CO2 24  19 - 32 mEq/L   Glucose, Bld 95  70 - 99 mg/dL   BUN 50 (*) 6 - 23 mg/dL   Creatinine, Ser 0.45 (*) 0.50 - 1.10 mg/dL   Calcium 9.2  8.4 - 40.9 mg/dL   GFR calc non Af Amer 18 (*) >90 mL/min   GFR calc Af Amer 21 (*) >90 mL/min  CBC     Status: Abnormal   Collection Time   12/28/12  5:00 AM      Component Value Range   WBC 11.1 (*) 4.0 - 10.5 K/uL   RBC 2.81 (*) 3.87 - 5.11 MIL/uL   Hemoglobin 8.6 (*) 12.0 - 15.0 g/dL   HCT 81.1 (*) 91.4 - 78.2 %   MCV 93.2  78.0 - 100.0 fL   MCH 30.6  26.0 - 34.0 pg   MCHC 32.8  30.0 - 36.0 g/dL   RDW 95.6  21.3 - 08.6 %   Platelets 244  150 - 400 K/uL  GLUCOSE, CAPILLARY     Status: Normal   Collection Time   12/28/12  6:06 AM      Component Value Range   Glucose-Capillary 97  70 - 99 mg/dL  HEPARIN LEVEL (UNFRACTIONATED)     Status: Normal   Collection Time   12/28/12  8:59 AM      Component Value Range  Heparin Unfractionated 0.36  0.30 - 0.70 IU/mL  GLUCOSE, CAPILLARY     Status: Abnormal   Collection Time   12/28/12 11:01 AM      Component Value Range   Glucose-Capillary 161 (*) 70 - 99 mg/dL   Comment 1 Notify RN      Imaging results:  Dg Chest 2 View  12/27/2012  *RADIOLOGY REPORT*  Clinical Data: Discomfort in chest, weakness.  Hypertension. Diabetes mellitus, renal insufficiency.  CHEST - 2 VIEW  Comparison: 09/06/2012  Findings: Cardiomegaly.  No active infiltrates or failure.  No effusion or pneumothorax.  Degenerative change both shoulders.  IMPRESSION: Stable exam.  Cardiomegaly without failure or infiltrates.   Original Report  Authenticated By: Davonna Belling, M.D.    Dg Ankle Complete Left  12/28/2012  *RADIOLOGY REPORT*  Clinical Data: Ankle pain and swelling.  LEFT ANKLE COMPLETE - 3+ VIEW  Comparison: None.  Findings: No fracture or dislocation is noted.  Joint spaces are intact.  Soft tissue swelling is seen medially and laterally.  IMPRESSION: No fracture or dislocation seen.   Original Report Authenticated By: Lupita Raider.,  M.D.    Dg Ankle Complete Right  12/28/2012  *RADIOLOGY REPORT*  Clinical Data: Ankle pain and swelling.  RIGHT ANKLE - COMPLETE 3+ VIEW  Comparison: None.  Findings: No fracture or dislocation is noted.  Joint spaces are intact.  Soft tissue swelling is seen medially and laterally.  IMPRESSION: No fracture or dislocation seen.   Original Report Authenticated By: Lupita Raider.,  M.D.    Dg Foot Complete Left  12/28/2012  *RADIOLOGY REPORT*  Clinical Data: Pain and swelling.  LEFT FOOT - COMPLETE 3+ VIEW  Comparison: None.  Findings: No fracture or dislocation is noted.  Joint spaces are intact.  Mild spurring of posterior calcaneus is noted.  IMPRESSION: No fracture or dislocation is seen.   Original Report Authenticated By: Lupita Raider.,  M.D.    Dg Foot Complete Right  12/28/2012  *RADIOLOGY REPORT*  Clinical Data: Pain and swelling.  RIGHT FOOT COMPLETE - 3+ VIEW  Comparison: None.  Findings: No fracture or dislocation is noted.  Spurring of posterior calcaneus is noted.  Joint spaces are intact.  IMPRESSION: No fracture or dislocation seen.   Original Report Authenticated By: Lupita Raider.,  M.D.     Assessment and Plan: I agree with the formulated Assessment and Plan with the following changes:   1. Ankle pain/swelling - She was initially started on heparin full dose for presumed DVT.  On re-examination of legs, this looks to be contained to the ankle with pain on dorsiflexion and inversion the worst, but also on eversion and plantarflexion and in the bilateral ankles R worse than L.    - Ankle/foot xrays - PT/OT for further evaluation - She received heparin for 12 hours, on discussion with ultrasound tech, they could not visualize femoral vasculature because of body habitus - will d/c heparin now.  - Will evaluate with physical exam daily - Does not appear swelling is related to heart failure as her weight is down - Daily I/O, daily weights.   2. AKI - Small amount of fluids, eval for improvement - Hold renally excreted medications.  - BMP daily  3. Dysuria - Urine studies pending  Other issues as per resident note.   On discussion with daughter, Ms. Wool would probably benefit from SNF placement, SW/CM consult placed.    Inez Catalina, MD 1/8/20143:38 PM

## 2012-12-29 DIAGNOSIS — R609 Edema, unspecified: Secondary | ICD-10-CM

## 2012-12-29 DIAGNOSIS — E785 Hyperlipidemia, unspecified: Secondary | ICD-10-CM

## 2012-12-29 LAB — GLUCOSE, CAPILLARY: Glucose-Capillary: 122 mg/dL — ABNORMAL HIGH (ref 70–99)

## 2012-12-29 LAB — CBC WITH DIFFERENTIAL/PLATELET
Basophils Absolute: 0 10*3/uL (ref 0.0–0.1)
Basophils Relative: 0 % (ref 0–1)
HCT: 26.7 % — ABNORMAL LOW (ref 36.0–46.0)
Hemoglobin: 8.6 g/dL — ABNORMAL LOW (ref 12.0–15.0)
Lymphocytes Relative: 27 % (ref 12–46)
MCHC: 32.2 g/dL (ref 30.0–36.0)
Monocytes Absolute: 0.9 10*3/uL (ref 0.1–1.0)
Monocytes Relative: 10 % (ref 3–12)
Neutro Abs: 5.5 10*3/uL (ref 1.7–7.7)
Neutrophils Relative %: 60 % (ref 43–77)
RDW: 13.8 % (ref 11.5–15.5)
WBC: 9.2 10*3/uL (ref 4.0–10.5)

## 2012-12-29 LAB — URINALYSIS, ROUTINE W REFLEX MICROSCOPIC
Nitrite: POSITIVE — AB
Specific Gravity, Urine: 1.016 (ref 1.005–1.030)
Urobilinogen, UA: 1 mg/dL (ref 0.0–1.0)
pH: 5.5 (ref 5.0–8.0)

## 2012-12-29 LAB — BASIC METABOLIC PANEL
CO2: 23 mEq/L (ref 19–32)
Chloride: 107 mEq/L (ref 96–112)
GFR calc Af Amer: 26 mL/min — ABNORMAL LOW (ref >90)
Potassium: 4.1 mEq/L (ref 3.5–5.1)

## 2012-12-29 LAB — URINE MICROSCOPIC-ADD ON

## 2012-12-29 MED ORDER — ASPIRIN EC 81 MG PO TBEC
81.0000 mg | DELAYED_RELEASE_TABLET | Freq: Every day | ORAL | Status: DC
  Administered 2012-12-30: 81 mg via ORAL
  Filled 2012-12-29: qty 1

## 2012-12-29 NOTE — Progress Notes (Signed)
1200 voided freely to clean commode after pericare  Was done . Back to bed . Bladder scan done  0 ml  Residual   Clean catch urine sent to lab for urinalysis . Dr Heloise Beecham aware

## 2012-12-29 NOTE — Evaluation (Signed)
Occupational Therapy Evaluation Patient Details Name: [name] MRN: [id number] DOB: June 14, 1936 Today's Date: 12/29/2012 Time: 4098-1191 OT Time Calculation (min): 36 min  OT Assessment / Plan / Recommendation Clinical Impression  This 77 yo female admitted  with ankle swelling and pain presents to acute OT with problems below. Will benefit from acute OT with follow up at SNF.    OT Assessment  Patient needs continued OT Services    Follow Up Recommendations  SNF    Barriers to Discharge Decreased caregiver support    Equipment Recommendations  None recommended by OT       Frequency  Min 2X/week    Precautions / Restrictions Precautions Precautions: Fall Precaution Comments: limited vision Restrictions Weight Bearing Restrictions: No   Pertinent Vitals/Pain Right foot and right second finger    ADL  Eating/Feeding: Simulated;Minimal assistance Where Assessed - Eating/Feeding: Chair Grooming: Simulated;Minimal assistance Where Assessed - Grooming: Supported sitting Upper Body Bathing: Simulated;Minimal assistance Where Assessed - Upper Body Bathing: Supported sitting Lower Body Bathing: Simulated;+1 Total assistance Where Assessed - Lower Body Bathing: Supine, head of bed up;Supine, head of bed flat Upper Body Dressing: Simulated;Maximal assistance Where Assessed - Upper Body Dressing: Supported sitting Lower Body Dressing: Simulated;+1 Total assistance Where Assessed - Lower Body Dressing: Supine, head of bed up;Supine, head of bed flat Toilet Transfer: Performed;+2 Total assistance (Using Bariatric Sara Plus) Toilet Transfer: Patient Percentage: 50% Toilet Transfer Method: Sit to Barista: Bedside commode Toileting - Clothing Manipulation and Hygiene: Performed;+1 Total assistance (standing with Geralyn Corwin) Where Assessed - Engineer, mining and Hygiene: Standing Equipment Used: Gait belt (Bari  Stedy) Transfers/Ambulation Related to ADLs: Total A +2 pt=50% with Launa Flight for sit to stand and stand to sit    OT Diagnosis: Generalized weakness;Blindness and low vision  OT Problem List: Decreased strength;Impaired balance (sitting and/or standing);Impaired vision/perception;Pain OT Treatment Interventions: Self-care/ADL training;DME and/or AE instruction;Patient/family education;Balance training   OT Goals Acute Rehab OT Goals OT Goal Formulation: With patient Time For Goal Achievement: 01/05/13 Potential to Achieve Goals: Good ADL Goals Pt Will Perform Grooming: with set-up;with supervision;Unsupported;Sitting, edge of bed ADL Goal: Grooming - Progress: Goal set today Pt Will Perform Upper Body Bathing: with set-up;with supervision;Unsupported;Sitting, edge of bed ADL Goal: Upper Body Bathing - Progress: Goal set today Pt Will Transfer to Toilet: with mod assist;Squat pivot transfer;Stand pivot transfer;3-in-1 ADL Goal: Toilet Transfer - Progress: Goal set today Miscellaneous OT Goals Miscellaneous OT Goal #1: Pt will be able to stand long enough Geralyn Corwin for someone to help her wash her back peri area and back of upper thighs before needing to sit down (>1 minute) OT Goal: Miscellaneous Goal #1 - Progress: Goal set today  Visit Information  Last OT Received On: 12/29/12 Assistance Needed: +2 PT/OT Co-Evaluation/Treatment: Yes    Subjective Data  Patient Stated Goal: Family---" for pt to go SNF"   Prior Functioning     Home Living Lives With: Daughter Available Help at Discharge: Family;Available 24 hours/day (can no longer can manage pt at home) Type of Home: House Prior Function Level of Independence: Needs assistance Needs Assistance: Bathing;Dressing;Feeding;Grooming;Toileting;Meal Prep;Light Housekeeping;Gait;Transfers Bath: Maximal Dressing: Total Feeding: Minimal Grooming: Moderate Toileting: Total Meal Prep: Total Light Housekeeping: Total Gait  Assistance: Unable recently Transfer Assistance: Total A Able to Take Stairs?: No Driving: No Vocation: Retired            IT consultant  Overall Cognitive Status: Appears within functional limits for tasks assessed/performed Arousal/Alertness: Awake/alert  Orientation Level: Appears intact for tasks assessed Behavior During Session: [facility] for tasks performed Cognition - Other Comments: Pt able to state that she would call for help in a fire but unable to accurately state "911"    Extremity/Trunk Assessment Right Upper Extremity Assessment RUE ROM/Strength/Tone: Deficits RUE ROM/Strength/Tone Deficits: Right 2nd digit swollen and sore, rest 3/5 Left Upper Extremity Assessment LUE ROM/Strength/Tone: Deficits LUE ROM/Strength/Tone Deficits: grossly 3/5 Right Lower Extremity Assessment RLE ROM/Strength/Tone: Deficits RLE ROM/Strength/Tone Deficits: hip flexion 3/5, knee extension 3+/5 Left Lower Extremity Assessment LLE ROM/Strength/Tone: Deficits LLE ROM/Strength/Tone Deficits: hip flexion and knee extension 3+/5     Mobility Bed Mobility Bed Mobility: Supine to Sit;Sitting - Scoot to Edge of Bed Supine to Sit: 4: Min assist;With rails;HOB elevated (HOB 15 degrees) Sitting - Scoot to Edge of Bed: 5: Supervision Details for Bed Mobility Assistance: cueing for sequence with min assist to reach for rail to pull up and fully elevate trunk with increased time for scooting to EOB with cueing to perform Transfers Sit to Stand: 1: +2 Total assist;From bed Sit to Stand: Patient Percentage: 50% Stand to Sit: To bed;To chair/3-in-1 Transfer via Lift Equipment: Stedy Details for Transfer Assistance: Attempted standing x 2 from bed with 2 person assist and able to achiever anterior translation and buttock mostly off bed but pt would not fully extend trunk. With use of Stedy pt able to achieve standing and clear buttock to lower seat pads on stedy for pivot bed to [facility] and for standing for  pericare as well as transfer BSC to recliner. Pt stood 1 min with bil UE assist for pericare. Max cueing for sequence with assist to translate pelvis and hand over hand placement for hand position due to lack of vision           Balance Static Sitting Balance Static Sitting - Balance Support: Feet supported Static Sitting - Level of Assistance: 5: Stand by assistance Static Sitting - Comment/# of Minutes: 3 Static Standing Balance Static Standing - Balance Support: Bilateral upper extremity supported Static Standing - Level of Assistance: 5: Stand by assistance Static Standing - Comment/# of Minutes: 1   End of Session OT - End of Session Equipment Utilized During Treatment: Gait belt Activity Tolerance: Patient tolerated treatment well Patient left: in chair;with call bell/phone within reach;with family/visitor present Nurse Communication: Mobility status (Use Geralyn Corwin to get pt back to bed from recliner)       Evette Georges [phone] 12/29/2012, 10:34 AM

## 2012-12-29 NOTE — Progress Notes (Signed)
Internal Medicine Teaching Service Attending Note Date: 12/29/2012  Patient name: [name]  Medical record number: [id number]  Date of birth: 09-17-1936    This patient has been seen and discussed with the house staff. Please see their note for complete details. I concur with their findings and plan.   We discussed patients case at length with her Daughter Marylene Land this morning.  It appears she may have fallen at home and incurred an injury at that time.  Her pain is greatly improved this morning.  Her family and patient have expressed wish to explore SNF placement for advanced care.  SW/CM is consulted.   Harrol Novello 12/29/2012, 2:36 PM

## 2012-12-29 NOTE — Progress Notes (Signed)
Subjective: Interval improvement in pain and swelling of R foot. Daughter mentions that mother fell at home and brusing appeared on R foot several days ago. She may have twisted her ankle.  Spoke w pt and daughter at length about need for more help with caring for mother, especially assistance at nighttime when she needs to get out of bed to use the bathroom. Family is interested in SNF options.  Denies CP, SOB, N/V, dizziness, abdominal pain, diarrhea.    Objective: Vital signs in last 24 hours: Filed Vitals:   12/28/12 1736 12/28/12 2027 12/28/12 2318 12/29/12 0549  BP: 155/69 124/72  112/67  Pulse: 89 90 91 87  Temp:  97.6 F (36.4 C)  97.9 F (36.6 C)  TempSrc:  Oral  Oral  Resp:  20 18 20   Height:      Weight:    235 lb 7.2 oz (106.8 kg)  SpO2:  96% 96% 96%   Weight change: -3 lb 2.4 oz (-1.428 kg)  Intake/Output Summary (Last 24 hours) at 12/29/12 1008 Last data filed at 12/29/12 4403  Gross per 24 hour  Intake 2175.08 ml  Output    325 ml  Net 1850.08 ml   Vitals reviewed. General: resting in bed, NAD HEENT: PERRL, EOMI, no scleral icterus Cardiac: RRR, no rubs, murmurs or gallops Pulm: clear to auscultation bilaterally, no wheezes, rales, or rhonchi Abd: soft, nontender, nondistended, BS present Ext: 2+ pitting edema of bilateral feet, R>L. Interval improvement in R foot redness and edema. Minimal pain of foot with dorsiflexion and inversion. Neuro: alert and oriented X3, cranial nerves II-XII grossly intact  Lab Results: Basic Metabolic Panel:  Lab 12/29/12 4742 12/28/12 0500  NA 143 141  K 4.1 4.0  CL 107 104  CO2 23 24  GLUCOSE 114* 95  BUN 39* 50*  CREATININE 2.07* 2.51*  CALCIUM 9.0 9.2  MG -- --  PHOS -- --   Liver Function Tests:  Lab 12/27/12 1750  AST 24  ALT 22  ALKPHOS 85  BILITOT 0.2*  PROT 8.2  ALBUMIN 2.9*   CBC:  Lab 12/29/12 0540 12/28/12 0500 12/27/12 1750  WBC 9.2 11.1* --  NEUTROABS 5.5 -- 7.5  HGB 8.6* 8.6* --  HCT 26.7*  26.2* --  MCV 93.0 93.2 --  PLT 241 244 --   Cardiac Enzymes:  Lab 12/27/12 1750  CKTOTAL --  CKMB --  CKMBINDEX --  TROPONINI <0.30   BNP:  Lab 12/27/12 1750  PROBNP 3027.0*   CBG:  Lab 12/29/12 0624 12/28/12 2135 12/28/12 1538 12/28/12 1101 12/28/12 0606 12/27/12 2236  GLUCAP 122* 150* 131* 161* 97 121*   Coagulation:  Lab 12/27/12 1750  LABPROT 14.5  INR 1.15   Studies/Results: Dg Chest 2 View  12/27/2012  *RADIOLOGY REPORT*  Clinical Data: Discomfort in chest, weakness.  Hypertension. Diabetes mellitus, renal insufficiency.  CHEST - 2 VIEW  Comparison: 09/06/2012  Findings: Cardiomegaly.  No active infiltrates or failure.  No effusion or pneumothorax.  Degenerative change both shoulders.  IMPRESSION: Stable exam.  Cardiomegaly without failure or infiltrates.   Original Report Authenticated By: Davonna Belling, M.D.    Dg Ankle Complete Left  12/28/2012  *RADIOLOGY REPORT*  Clinical Data: Ankle pain and swelling.  LEFT ANKLE COMPLETE - 3+ VIEW  Comparison: None.  Findings: No fracture or dislocation is noted.  Joint spaces are intact.  Soft tissue swelling is seen medially and laterally.  IMPRESSION: No fracture or dislocation seen.   Original Report  Authenticated By: Lupita Raider.,  M.D.    Dg Ankle Complete Right  12/28/2012  *RADIOLOGY REPORT*  Clinical Data: Ankle pain and swelling.  RIGHT ANKLE - COMPLETE 3+ VIEW  Comparison: None.  Findings: No fracture or dislocation is noted.  Joint spaces are intact.  Soft tissue swelling is seen medially and laterally.  IMPRESSION: No fracture or dislocation seen.   Original Report Authenticated By: Lupita Raider.,  M.D.    Dg Foot Complete Left  12/28/2012  *RADIOLOGY REPORT*  Clinical Data: Pain and swelling.  LEFT FOOT - COMPLETE 3+ VIEW  Comparison: None.  Findings: No fracture or dislocation is noted.  Joint spaces are intact.  Mild spurring of posterior calcaneus is noted.  IMPRESSION: No fracture or dislocation is seen.    Original Report Authenticated By: Lupita Raider.,  M.D.    Dg Foot Complete Right  12/28/2012  *RADIOLOGY REPORT*  Clinical Data: Pain and swelling.  RIGHT FOOT COMPLETE - 3+ VIEW  Comparison: None.  Findings: No fracture or dislocation is noted.  Spurring of posterior calcaneus is noted.  Joint spaces are intact.  IMPRESSION: No fracture or dislocation seen.   Original Report Authenticated By: Lupita Raider.,  M.D.    Medications: I have reviewed the patient's current medications. Scheduled Meds:    . aspirin EC  325 mg Oral Daily  . atorvastatin  20 mg Oral q1800  . brimonidine  1 drop Both Eyes Daily  . carvedilol  25 mg Oral BID WC  . heparin  5,000 Units Subcutaneous Q8H  . insulin aspart  0-5 Units Subcutaneous QHS  . insulin aspart  0-9 Units Subcutaneous TID WC  . insulin glargine  10 Units Subcutaneous Daily  . sodium chloride  3 mL Intravenous Q12H   Continuous Infusions:    . sodium chloride 50 mL/hr at 12/28/12 2358   PRN Meds:.sodium chloride, acetaminophen, acetaminophen, ondansetron (ZOFRAN) IV, ondansetron, sodium chloride  Assessment/Plan: 1. Foot/ankle edema and pain: On admission, concern for possible R LE DVT given asymmetric edema (right greater than left). Dopplers negative for DVT, Wells score low (1 for bedridden), low probability for DVT. No SOB, hypoxemia to suggest PE.  Negative ankle and foot xrays bilaterally. Suspect soft tissue injury responsible for R ankle/foot pain as patient w recent fall at home onto her foot, as well as history of bruising to that foot.  Exacerbation of CHF is possibility, but no JVD, dyspnea, rales on exam and no pulmonary vascular congestion on CXR. She is about 22lbs down from baseline weight. Interval elevation of BNP may reflect renal failure.  - Improving -PT to assess strength and needs. Family interested in SNF.     2. Acute kidney injury and stage III CKD:  Baseline Cr 1.15. Her baseline GFR is calculated to be around  50-55. Cr on admission was 2.7, likely prerenal due to overuse and diuresis with furosemide, this is supported by elevated BUN:Cr as well as improvement in Cr from 2.7-->2.0 today with gentle fluid hydration. FeUrea does not suggest intrarenal pathology.  - Normal saline 50 mL/hr   3. Dysuria:  Concerning for cystitis. First UA contaminated.  -Cath specimen ordered  4. Diabetes mellitus, type II:  At home, her only diabetes medicine is metformin. Hemoglobin A1c from March 2013 was 7.7.  - Sensitive scale correctional insulin aspart with meals and at bedtime  - Insulin glargine, 10 units daily  - Hold metformin   5. Obstructive sleep apnea:  CPAP at  night.   6. Hyperlipidemia:  On rosuvastatin 10 mg at home. Will treat with high potency statin while inpatient. - Atorvastatin 20 mg daily  7. Hypertension:  At home, she takes carvedilol 25 mg twice a day, furosemide 20 mg twice a day, and losartan 25 mg daily. In the setting of acute kidney injury, we will discontinue her furosemide and losartan. If evidence of an acute decompensated CHF, we will resume treatment with furosemide.  - Continue carvedilol 25 mg twice a day  - Hold lisinopril and furosemide for now   8. CAD and nonischemic cardiomyopathy:  Heart catheterization in 2007 demonstrated a 40% proximal LAD lesion and a 40% proximal D1 lesion. Echocardiogram from February 2013 demonstrated an ejection fraction of 50-55%, mildly dilated left ventricle with normal wall motion, normal right ventricular function, and normal pulmonary artery pressures. She is on appropriate pharmacotherapy with aspirin, carvedilol, losartan, and rosuvastatin.   - Continue treatment with aspirin, carvedilol, and a statin; holding losartan in the setting of AKI.   9. Obesity:  Patient's BMI is 41.0 (238.6 pounds and 64 inches).   10. Prophylaxis: Full therapeutic heparin dosing   Dispo: Patient's daughter expressed to RN that she is having difficulty  caring for mother in home, interested in SNF options. Will have PT/OT evaluate patient, discuss w CSW.  Ready to go from a medical standpoint, awaiting placement optiosn.   The patient does have a current PCP Dorise Hiss, ELIZABETH, MD), therefore will be requiring OPC follow-up after discharge.   The patient does not have transportation limitations that hinder transportation to clinic appointments.  .Services Needed at time of discharge: Y = Yes, Blank = No PT:   OT:   RN:   Equipment:   Other:     LOS: 2 days   Bronson Curb 12/29/2012, 10:08 AM

## 2012-12-29 NOTE — Evaluation (Signed)
Physical Therapy Evaluation Patient Details Name: [name] MRN: [id number] DOB: Dec 19, 1936 Today's Date: 12/29/2012 Time: 4098-1191 PT Time Calculation (min): 30 min  PT Assessment / Plan / Recommendation Clinical Impression  Pt admitted with R>L ankle edema with DVT r/o on doppler but not fully visualized and CHF. Pt was at [facility] from [place] and states she was able to walk in the house at that time but has had increased assist at home and unable to stand recently. Pt will benefit from acute therapy to maximize mobility, strength, function and safety prior to discharge to decrease burden of care    PT Assessment  Patient needs continued PT services    Follow Up Recommendations  SNF    Does the patient have the potential to tolerate intense rehabilitation      Barriers to Discharge Decreased caregiver support      Equipment Recommendations  Hospital bed    Recommendations for Other Services     Frequency Min 3X/week    Precautions / Restrictions Precautions Precautions: Fall Precaution Comments: limited vision Restrictions Weight Bearing Restrictions: No   Pertinent Vitals/Pain 2/10 right ankle pain      Mobility  Bed Mobility Bed Mobility: Supine to Sit;Sitting - Scoot to Edge of Bed Supine to Sit: 4: Min assist;With rails;HOB elevated (HOB 15 degrees) Sitting - Scoot to Edge of Bed: 5: Supervision Details for Bed Mobility Assistance: cueing for sequence with min assist to reach for rail to pull up and fully elevate trunk with increased time for scooting to EOB with cueing to perform Transfers Transfers: Sit to Stand;Stand to Sit;Stand Pivot Transfers Sit to Stand: 1: +2 Total assist;From bed Sit to Stand: Patient Percentage: 50% Stand to Sit: To bed;To chair/3-in-1 Stand Pivot Transfers: 1: +2 Total assist Stand Pivot Transfers: Patient Percentage: 50% Transfer via Lift Equipment: Stedy Details for Transfer Assistance: Attempted standing x 2 from  bed with 2 person assist and able to achiever anterior translation and buttock mostly off bed but pt would not fully extend trunk. With use of Stedy pt able to achieve standing and clear buttock to lower seat pads on stedy for pivot bed to [facility] and for standing for pericare as well as transfer BSC to recliner. Pt stood 1 min with bil UE assist for pericare. Max cueing for sequence with assist to translate pelvis and hand over hand placement for hand position due to lack of vision Ambulation/Gait Ambulation/Gait Assistance: Not tested (comment)    Shoulder Instructions     Exercises     PT Diagnosis: Generalized weakness  PT Problem List: Decreased strength;Decreased activity tolerance;Decreased mobility;Decreased knowledge of use of DME;Decreased safety awareness;Obesity PT Treatment Interventions: DME instruction;Functional mobility training;Therapeutic activities;Therapeutic exercise;Patient/family education   PT Goals Acute Rehab PT Goals PT Goal Formulation: With patient/family Time For Goal Achievement: 01/12/13 Potential to Achieve Goals: Fair Pt will go Supine/Side to Sit: with supervision;with HOB 0 degrees PT Goal: Supine/Side to Sit - Progress: Goal set today Pt will go Sit to Supine/Side: with min assist;with rail PT Goal: Sit to Supine/Side - Progress: Goal set today Pt will go Sit to Stand: with +2 total assist (pt=75%) PT Goal: Sit to Stand - Progress: Goal set today Pt will go Stand to Sit: with +2 total assist (pt=75%) PT Goal: Stand to Sit - Progress: Goal set today Pt will Transfer Bed to Chair/Chair to Bed: with +2 total assist (pt=60%) PT Transfer Goal: Bed to Chair/Chair to Bed - Progress: Goal set today  Visit Information  Last PT Received On: 12/29/12 Assistance Needed: +2 PT/OT Co-Evaluation/Treatment: Yes    Subjective Data  Subjective: I have to have something to pull up on Patient Stated Goal: be more mobile and able to walk   Prior Functioning  Home  Living Lives With: Daughter Available Help at Discharge: Family;Available 24 hours/day (can no longer can manage pt at home) Type of Home: House Prior Function Level of Independence: Needs assistance Needs Assistance: Bathing;Dressing;Feeding;Grooming;Toileting;Meal Prep;Light Housekeeping;Gait;Transfers Bath: Maximal Dressing: Total Feeding: Minimal Grooming: Moderate Toileting: Total Meal Prep: Total Light Housekeeping: Total Gait Assistance: Unable recently Transfer Assistance: Total A Able to Take Stairs?: No Driving: No Vocation: Retired    IT consultant  Overall Cognitive Status: Appears within functional limits for tasks assessed/performed Arousal/Alertness: Awake/alert Orientation Level: Appears intact for tasks assessed Behavior During Session: [facility] for tasks performed Cognition - Other Comments: Pt able to state that she would call for help in a fire but unable to accurately state "911"    Extremity/Trunk Assessment Right Upper Extremity Assessment RUE ROM/Strength/Tone: Deficits RUE ROM/Strength/Tone Deficits: Right 2nd digit swollen and sore, rest 3/5 Left Upper Extremity Assessment LUE ROM/Strength/Tone: Deficits LUE ROM/Strength/Tone Deficits: grossly 3/5 Right Lower Extremity Assessment RLE ROM/Strength/Tone: Deficits RLE ROM/Strength/Tone Deficits: hip flexion 3/5, knee extension 3+/5 Left Lower Extremity Assessment LLE ROM/Strength/Tone: Deficits LLE ROM/Strength/Tone Deficits: hip flexion and knee extension 3+/5   Balance Static Sitting Balance Static Sitting - Balance Support: Feet supported Static Sitting - Level of Assistance: 5: Stand by assistance Static Sitting - Comment/# of Minutes: 3 Static Standing Balance Static Standing - Balance Support: Bilateral upper extremity supported Static Standing - Level of Assistance: 5: Stand by assistance Static Standing - Comment/# of Minutes: 1  End of Session PT - End of Session Equipment Utilized During  Treatment: Gait belt Activity Tolerance: Patient tolerated treatment well Patient left: in chair;with call bell/phone within reach;with family/visitor present Nurse Communication: Mobility status  GP     Delorse Lek 12/29/2012, 10:36 AM  Delaney Meigs, PT [phone]

## 2012-12-30 DIAGNOSIS — M25579 Pain in unspecified ankle and joints of unspecified foot: Secondary | ICD-10-CM

## 2012-12-30 DIAGNOSIS — I129 Hypertensive chronic kidney disease with stage 1 through stage 4 chronic kidney disease, or unspecified chronic kidney disease: Secondary | ICD-10-CM

## 2012-12-30 DIAGNOSIS — S93401A Sprain of unspecified ligament of right ankle, initial encounter: Secondary | ICD-10-CM | POA: Diagnosis present

## 2012-12-30 DIAGNOSIS — W19XXXA Unspecified fall, initial encounter: Secondary | ICD-10-CM

## 2012-12-30 LAB — GLUCOSE, CAPILLARY: Glucose-Capillary: 95 mg/dL (ref 70–99)

## 2012-12-30 MED ORDER — ATORVASTATIN CALCIUM 20 MG PO TABS: 20.0000 mg | ORAL_TABLET | Freq: Every day | ORAL | Status: DC

## 2012-12-30 MED ORDER — DEXTROSE 5 % IV SOLN
1.0000 g | Freq: Once | INTRAVENOUS | Status: AC
  Administered 2012-12-30: 1 g via INTRAVENOUS
  Filled 2012-12-30 (×2): qty 10

## 2012-12-30 MED ORDER — INSULIN GLARGINE 100 UNIT/ML ~~LOC~~ SOLN: 10.0000 [IU] | Freq: Every day | SUBCUTANEOUS | Status: DC

## 2012-12-30 MED ORDER — CEPHALEXIN 500 MG PO CAPS: 500.0000 mg | ORAL_CAPSULE | Freq: Two times a day (BID) | ORAL | Status: DC

## 2012-12-30 NOTE — Discharge Summary (Signed)
Internal Medicine Teaching [facility] Discharge Note  Name: [name] MRN: [id number] DOB: 04/08/1936 77 y.o.  Date of Admission: 12/27/2012  5:27 PM Date of Discharge: 12/30/2012 Attending Physician: Inez Catalina, MD  Discharge Diagnosis: 1. R Foot/ankle edema and pain secondary to fall and soft tissue injury 2. Acute kidney injury and stage III CKD  3. Urinary tract infection 4. Diabetes mellitus, type II  5. Obstructive sleep apnea  6. Hyperlipidemia  7. Hypertension  8. CAD and nonischemic cardiomyopathy  9. Obesity  Discharge Medications:   Medication List     As of 12/30/2012 11:48 AM    STOP taking these medications         furosemide 20 MG tablet   Commonly known as: LASIX      losartan 25 MG tablet   Commonly known as: COZAAR      METFORMIN HCL PO      rosuvastatin 10 MG tablet   Commonly known as: CRESTOR      TAKE these medications         aspirin EC 81 MG tablet   Take 1 tablet (81 mg total) by mouth daily.      atorvastatin 20 MG tablet   Commonly known as: LIPITOR   Take 1 tablet (20 mg total) by mouth daily.      brimonidine 0.2 % ophthalmic solution   Commonly known as: ALPHAGAN   Place 1 drop into both eyes daily.      carvedilol 25 MG tablet   Commonly known as: COREG   Take 1 tablet (25 mg total) by mouth 2 (two) times daily with a meal.      cephALEXin 500 MG capsule   Commonly known as: KEFLEX   Take 1 capsule (500 mg total) by mouth every 12 (twelve) hours.   Start taking on: 12/31/2012      DSS 100 MG Caps   Take 100 mg by mouth 2 (two) times daily.      glucose blood test strip   1 each by Other route 2 (two) times daily.      insulin glargine 100 UNIT/ML injection   Commonly known as: LANTUS   Inject 10 Units into the skin daily.      LORazepam 0.5 MG tablet   Commonly known as: ATIVAN   Take 0.25 mg by mouth every 6 (six) hours as needed. For anxiety and/or exercise.      TYLENOL PO   Take by mouth.           Disposition and follow-up:   [name] was discharged from [facility] in fair condition.  At the hospital follow up visit please address  1) Acute renal failure We held her furosemide and losartan in setting of AKI, likely due to overdiuresis. Please check BMP 01/02/13 and restart furosemide and losartan if creatinine appropriately decreased.  2) R foot swelling after fall, likely soft tissue injury Continue PT and OT as recommended by inpatient assessments.  3) Resolution of UTI symptoms  Discharged on Keflex as prescribed for UTI. 4) DM Patient previously on metformin. Due to labile kidney function, this was d/c'd and lantus 10U at night was substituted. Please check HbA1c and follow up on blood glucose control.     Follow-up Appointments: Follow-up Information    Follow up with Genella Mech, MD. On 02/03/2013. (2:45)    Contact information:   [facility] [address] [place]  [address] [phone]        Discharge Orders    Future Appointments: Provider: Department: Dept Phone: Center:   02/03/2013 2:45 PM Judie Bonus, MD [facility] [phone] [facility]     Future Orders Please Complete By Expires   Diet - low sodium heart healthy      Increase activity slowly      Discharge instructions      Comments:   1. Please HOLD furosemide and losartan in the setting of acute renal failure. Please draw a basic metabolic panel on 01/02/13, if Cr is acceptably decreasing, please restart medications. Please restart earlier if any evidence of acute decompensated heart failure.  2. Patient is to STOP TAKING metformin and start taking lantus, 10U at bedtime to be followed and adjusted by PCP> 3. Please finish 5 day course of cephalexin 500mg  BID for UTI. 4. Please continue with physical therapy and occupational therapy. 5. Continue CPAP with home machine.   Call MD for:  temperature  >100.4      Call MD for:  persistant nausea and vomiting      Call MD for:  redness, tenderness, or signs of infection (pain, swelling, redness, odor or green/yellow discharge around incision site)      Call MD for:  difficulty breathing, headache or visual disturbances      Call MD for:  persistant dizziness or light-headedness      Call MD for:  extreme fatigue      (HEART FAILURE PATIENTS) Call MD:  Anytime you have any of the following symptoms: 1) 3 pound weight gain in 24 hours or 5 pounds in 1 week 2) shortness of breath, with or without a dry hacking cough 3) swelling in the hands, feet or stomach 4) if you have to sleep on extra pillows at night in order to breathe.         Consultations:  none   Procedures Performed:  Dg Chest 2 View  12/27/2012  *RADIOLOGY REPORT*  Clinical Data: Discomfort in chest, weakness.  Hypertension. Diabetes mellitus, renal insufficiency.  CHEST - 2 VIEW  Comparison: 09/06/2012  Findings: Cardiomegaly.  No active infiltrates or failure.  No effusion or pneumothorax.  Degenerative change both shoulders.  IMPRESSION: Stable exam.  Cardiomegaly without failure or infiltrates.   Original Report Authenticated By: Davonna Belling, M.D.    Lower Extremity Dopplers  12/27/12 Lower extremity venous duplex has been completed. Preliminary findings: Bilateral: No evidence of DVT or Baker's Cyst. Cannot clearly visualize femoral veins bilaterally, therefore cannot rule out DVT in these areas.   Dg Ankle Complete Left  12/28/2012  *RADIOLOGY REPORT*  Clinical Data: Ankle pain and swelling.  LEFT ANKLE COMPLETE - 3+ VIEW  Comparison: None.  Findings: No fracture or dislocation is noted.  Joint spaces are intact.  Soft tissue swelling is seen medially and laterally.  IMPRESSION: No fracture or dislocation seen.   Original Report Authenticated By: Lupita Raider.,  M.D.    Dg Ankle Complete Right  12/28/2012  *RADIOLOGY REPORT*  Clinical Data: Ankle pain and swelling.  RIGHT ANKLE  - COMPLETE 3+ VIEW  Comparison: None.  Findings: No fracture or dislocation is noted.  Joint spaces are intact.  Soft tissue swelling is seen medially and laterally.  IMPRESSION: No fracture or dislocation seen.   Original Report Authenticated By: Lupita Raider.,  M.D.    Dg Foot Complete Left  12/28/2012  *RADIOLOGY REPORT*  Clinical Data: Pain and swelling.  LEFT FOOT - COMPLETE 3+ VIEW  Comparison: None.  Findings: No fracture or dislocation is noted.  Joint spaces are intact.  Mild spurring of posterior calcaneus is noted.  IMPRESSION: No fracture or dislocation is seen.   Original Report Authenticated By: Lupita Raider.,  M.D.    Dg Foot Complete Right  12/28/2012  *RADIOLOGY REPORT*  Clinical Data: Pain and swelling.  RIGHT FOOT COMPLETE - 3+ VIEW  Comparison: None.  Findings: No fracture or dislocation is noted.  Spurring of posterior calcaneus is noted.  Joint spaces are intact.  IMPRESSION: No fracture or dislocation seen.   Original Report Authenticated By: Lupita Raider.,  M.D.     Admission HPI:  This is a 77 year old woman with diabetes, chronic diastolic congestive heart failure, nonischemic cardiomyopathy, hypertension, and nonobstructive coronary artery disease; presenting to the emergency department with ankle swelling and pain (right greater than left). She was recently discharged home from the rehabilitation center 5 days ago. Four days ago, she began experiencing swelling in her ankles and pain. This has progressed over the last 4 days, prompting her visit today to the emergency department. She is almost completely bed ridden; she gets out of bed into a chair for short time each day with a great deal of assistance. Walking on her feet is prohibited by this pain in her ankles. She has been taking her furosemide, 20 mg twice a day; and denies dyspnea, orthopnea, paroxysmal nocturnal dyspnea, and chest pain. She does report some dizziness when she is rolled a certain way in bed but denies  orthostatic dizziness. On review of systems, she denies headaches, cold symptoms, fevers, chills, productive cough, abdominal pain, diarrhea, constipation, hematochezia, and hematuria. She does report some dysuria today.  As we were leaving the room, I asked her if she had any questions. She said that she better not say too much otherwise "they will kill me." I asked her what she meant, and she asked me if I believed in "the aristocrats" which her family member corrected to "witchcraft".    Hospital Course by problem list: 1. R Foot/ankle edema and pain secondary to fall and soft tissue injury On admission, concern for possible R LE DVT given asymmetric edema (right greater than left). Dopplers negative for DVT, Wells score low (1 for bedridden), low probability for DVT. On exam, she had foot pain with dorsiflexion and inversion, but no calf pain,swelling or asymmetry. No SOB, hypoxemia to suggest PE. Exacerbation of CHF is possibility, but no JVD, dyspnea, rales on exam and no pulmonary vascular congestion on CXR. She was also about 22lbs down from baseline weight, making CHF exacerbation less likely. BNP was increased 617-->3027, but this interval increase may reflect renal failure(see problem 2). Negative ankle and foot xrays bilaterally. Patient's daughter revealed that pt had a fall at home a couple of days prior to admission and they had noted bruising over the right toes at that time. Ultimately suspect that soft tissue injury in setting of recent fall was etiology of R ankle/foot pain. During admission, pain, swelling and ROM of R ankle improved. She was evaluated by PT/OT who recommended SNF.   2. Acute kidney injury and stage III CKD:  Baseline Cr 1.15. Her baseline GFR is calculated to be around 50-55. Cr on admission was 2.7, likely prerenal due to overuse and diuresis with furosemide, this is supported by elevated BUN:Cr as well as improvement in Cr from 2.7-->2.0 with gentle fluid hydration.  FeUrea does not suggest  intrarenal pathology. To restart furosemide and losartan when Cr improves.   3. UTI Pt complained of dysuria. UA consistent with UTI. Rocephin 1 dose IV given, do be d/c'd on keflex.   4. Diabetes mellitus, type II:  At home, her only diabetes medicine is metformin. Hemoglobin A1c from March 2013 was 7.7. In setting of ARF, held metformin. Provided lantus 10U and SSI with meals. She has been requiring minimal mealtime coverage (about 1-2 U w each meal) and CBGs 90s-150. In setting of labile creatinine, will d/c metformin on discharge and keep patient on lantus, to be titrated in the outpatient setting for better control of diabetes.    5. Obstructive sleep apnea:  CPAP at night.   6. Hyperlipidemia:  On rosuvastatin 10 mg at home. Will change to high potency statin atorvastatin 20 mg daily.   7. Hypertension:  At home, she takes carvedilol 25 mg twice a day, furosemide 20 mg twice a day, and losartan 25 mg daily. In the setting of acute kidney injury, we will discontinued her furosemide and losartan. No evidence of an acute decompensated CHF. We continued carvedilol and held losartan and furosemide in setting of ARF.   8. CAD and nonischemic cardiomyopathy:  Heart catheterization in 2007 demonstrated a 40% proximal LAD lesion and a 40% proximal D1 lesion. Echocardiogram from February 2013 demonstrated an ejection fraction of 50-55%, mildly dilated left ventricle with normal wall motion, normal right ventricular function, and normal pulmonary artery pressures. She wass on appropriate pharmacotherapy with aspirin, carvedilol, losartan, and rosuvastatin. Continued treatment with aspirin, carvedilol, and a statin; held losartan in the setting of AKI.   9. Obesity:  Patient's BMI is 41.0 (238.6 pounds and 64 inches).   Discharge Vitals:  BP 142/92  Pulse 75  Temp 98.1 F (36.7 C) (Oral)  Resp 18  Ht 5\' 4"  (1.626 m)  Wt 235 lb 8 oz (106.822 kg)  BMI 40.42 kg/m2  SpO2  98%  Discharge Labs:  Results for orders placed during the hospital encounter of 12/27/12 (from the past 24 hour(s))  URINALYSIS, ROUTINE W REFLEX MICROSCOPIC     Status: Abnormal   Collection Time   12/29/12 12:09 PM      Component Value Range   Color, Urine YELLOW  YELLOW   APPearance CLOUDY (*) CLEAR   Specific Gravity, Urine 1.016  1.005 - 1.030   pH 5.5  5.0 - 8.0   Glucose, UA NEGATIVE  NEGATIVE mg/dL   Hgb urine dipstick MODERATE (*) NEGATIVE   Bilirubin Urine NEGATIVE  NEGATIVE   Ketones, ur NEGATIVE  NEGATIVE mg/dL   Protein, ur 30 (*) NEGATIVE mg/dL   Urobilinogen, UA 1.0  0.0 - 1.0 mg/dL   Nitrite POSITIVE (*) NEGATIVE   Leukocytes, UA LARGE (*) NEGATIVE  URINE MICROSCOPIC-ADD ON     Status: Abnormal   Collection Time   12/29/12 12:09 PM      Component Value Range   Squamous Epithelial / LPF MANY (*) RARE   WBC, UA TOO NUMEROUS TO COUNT  <3 WBC/hpf   RBC / HPF 3-6  <3 RBC/hpf   Bacteria, UA MANY (*) RARE   Casts GRANULAR CAST (*) NEGATIVE  GLUCOSE, CAPILLARY     Status: Abnormal   Collection Time   12/29/12  3:58 PM      Component Value Range   Glucose-Capillary 206 (*) 70 - 99 mg/dL   Comment 1 Notify RN    GLUCOSE, CAPILLARY     Status: Abnormal  Collection Time   12/29/12  9:11 PM      Component Value Range   Glucose-Capillary 135 (*) 70 - 99 mg/dL  GLUCOSE, CAPILLARY     Status: Normal   Collection Time   12/30/12  5:41 AM      Component Value Range   Glucose-Capillary 95  70 - 99 mg/dL  GLUCOSE, CAPILLARY     Status: Abnormal   Collection Time   12/30/12 11:03 AM      Component Value Range   Glucose-Capillary 172 (*) 70 - 99 mg/dL   Comment 1 Notify RN      Signed: Bronson Curb 12/30/2012, 11:48 AM   Time Spent on Discharge: 35 min Services Ordered on Discharge: SNF w PT/OT Equipment Ordered on Discharge: none

## 2012-12-30 NOTE — Progress Notes (Signed)
[id number]  To [place] via [employer] research officer, government . Daughter  Aware  As spoken  in the phone

## 2012-12-30 NOTE — Progress Notes (Signed)
Placed pt. On  CPAP via full face mask, auto titrate settings (max 16.0, min 5.0) Pt. Tolerating well at this time.

## 2012-12-30 NOTE — Progress Notes (Signed)
1020 telephone report given to  Rolly Pancake , nurse from [facility]

## 2012-12-30 NOTE — Progress Notes (Signed)
Subjective: Continued improvement of pain, edema, and mobility of R ankle and foot. Patient says she is moving around foot in bed to keep it flexible. Ready to go to SNF. No other complaints Denies CP, SOB, N/V, dizziness, abdominal pain, diarrhea.    Objective: Vital signs in last 24 hours: Filed Vitals:   12/29/12 1416 12/30/12 0100 12/30/12 0551 12/30/12 1035  BP: 147/53  125/57 142/92  Pulse: 72 82 78 75  Temp: 98 F (36.7 C)  98.1 F (36.7 C)   TempSrc: Oral  Oral   Resp: 20 20 20 18   Height:      Weight:   235 lb 8 oz (106.822 kg)   SpO2: 99% 96% 96% 98%   Weight change: 0.8 oz (0.022 kg)  Intake/Output Summary (Last 24 hours) at 12/30/12 1039 Last data filed at 12/30/12 0956  Gross per 24 hour  Intake   1720 ml  Output    425 ml  Net   1295 ml   Vitals reviewed. General: resting in bed, NAD HEENT: PERRL, EOMI, no scleral icterus Cardiac: RRR, no rubs, murmurs or gallops Pulm: clear to auscultation bilaterally, no wheezes, rales, or rhonchi Abd: soft, nontender, nondistended, BS present Ext: 1+ pitting edema of bilateral feet, R>L. Continued improvement today of R foot edema and mobility. No erythema. Minimal pain of foot with dorsiflexion and inversion. Neuro: alert and oriented X3, cranial nerves II-XII grossly intact  Lab Results: Basic Metabolic Panel:  Lab 12/29/12 1610 12/28/12 0500  NA 143 141  K 4.1 4.0  CL 107 104  CO2 23 24  GLUCOSE 114* 95  BUN 39* 50*  CREATININE 2.07* 2.51*  CALCIUM 9.0 9.2  MG -- --  PHOS -- --   Liver Function Tests:  Lab 12/27/12 1750  AST 24  ALT 22  ALKPHOS 85  BILITOT 0.2*  PROT 8.2  ALBUMIN 2.9*   CBC:  Lab 12/29/12 0540 12/28/12 0500 12/27/12 1750  WBC 9.2 11.1* --  NEUTROABS 5.5 -- 7.5  HGB 8.6* 8.6* --  HCT 26.7* 26.2* --  MCV 93.0 93.2 --  PLT 241 244 --   Cardiac Enzymes:  Lab 12/27/12 1750  CKTOTAL --  CKMB --  CKMBINDEX --  TROPONINI <0.30   BNP:  Lab 12/27/12 1750  PROBNP 3027.0*    CBG:  Lab 12/30/12 0541 12/29/12 2111 12/29/12 1558 12/29/12 1129 12/29/12 0624 12/28/12 2135  GLUCAP 95 135* 206* 212* 122* 150*   Coagulation:  Lab 12/27/12 1750  LABPROT 14.5  INR 1.15   Studies/Results: Dg Ankle Complete Left  12/28/2012  *RADIOLOGY REPORT*  Clinical Data: Ankle pain and swelling.  LEFT ANKLE COMPLETE - 3+ VIEW  Comparison: None.  Findings: No fracture or dislocation is noted.  Joint spaces are intact.  Soft tissue swelling is seen medially and laterally.  IMPRESSION: No fracture or dislocation seen.   Original Report Authenticated By: Lupita Raider.,  M.D.    Dg Ankle Complete Right  12/28/2012  *RADIOLOGY REPORT*  Clinical Data: Ankle pain and swelling.  RIGHT ANKLE - COMPLETE 3+ VIEW  Comparison: None.  Findings: No fracture or dislocation is noted.  Joint spaces are intact.  Soft tissue swelling is seen medially and laterally.  IMPRESSION: No fracture or dislocation seen.   Original Report Authenticated By: Lupita Raider.,  M.D.    Dg Foot Complete Left  12/28/2012  *RADIOLOGY REPORT*  Clinical Data: Pain and swelling.  LEFT FOOT - COMPLETE 3+ VIEW  Comparison: None.  Findings: No fracture or dislocation is noted.  Joint spaces are intact.  Mild spurring of posterior calcaneus is noted.  IMPRESSION: No fracture or dislocation is seen.   Original Report Authenticated By: Lupita Raider.,  M.D.    Dg Foot Complete Right  12/28/2012  *RADIOLOGY REPORT*  Clinical Data: Pain and swelling.  RIGHT FOOT COMPLETE - 3+ VIEW  Comparison: None.  Findings: No fracture or dislocation is noted.  Spurring of posterior calcaneus is noted.  Joint spaces are intact.  IMPRESSION: No fracture or dislocation seen.   Original Report Authenticated By: Lupita Raider.,  M.D.    Medications: I have reviewed the patient's current medications. Scheduled Meds:    . aspirin EC  81 mg Oral Daily  . atorvastatin  20 mg Oral q1800  . brimonidine  1 drop Both Eyes Daily  . carvedilol  25 mg  Oral BID WC  . cefTRIAXone (ROCEPHIN)  IV  1 g Intravenous Once  . cephALEXin  500 mg Oral Q12H  . heparin  5,000 Units Subcutaneous Q8H  . insulin aspart  0-5 Units Subcutaneous QHS  . insulin aspart  0-9 Units Subcutaneous TID WC  . insulin glargine  10 Units Subcutaneous Daily  . sodium chloride  3 mL Intravenous Q12H   Continuous Infusions:    . sodium chloride 50 mL/hr at 12/28/12 2358   PRN Meds:.sodium chloride, acetaminophen, acetaminophen, ondansetron (ZOFRAN) IV, ondansetron, sodium chloride  Assessment/Plan: 1. R Foot/ankle edema and pain 2/2 fall with soft tissue injury: On admission, concern for possible R LE DVT given asymmetric edema (right greater than left). Dopplers negative for DVT, Wells score low (1 for bedridden), low probability for DVT. No SOB, hypoxemia to suggest PE.  Negative ankle and foot xrays bilaterally. Suspect soft tissue injury responsible for R ankle/foot pain as patient w recent fall at home onto her foot, as well as history of bruising to that foot.  - Improving - SNF today, as recommended by PT/OT   2. Acute kidney injury and stage III CKD:  Baseline Cr 1.15. Her baseline GFR is calculated to be around 50-55. Cr on admission was 2.7, likely prerenal due to overuse and diuresis with furosemide, this is supported by elevated BUN:Cr as well as improvement in Cr from 2.7-->2.0 with gentle fluid hydration. FeUrea does not suggest intrarenal pathology.  - Normal saline 50 mL/hr  - Holding ARB, furosemide in setting of AKI  3. UTI UA demonstrates UTI Cx pending.  -Ceftriaxone IVx1, will d/c on keflex BID  4. Diabetes mellitus, type II:  At home, her only diabetes medicine is metformin. Hemoglobin A1c from March 2013 was 7.7. In setting of labile function, will discharge her on lantus 10U daily and no metformin.   5. Obstructive sleep apnea:  CPAP at night.   6. Hyperlipidemia:  On rosuvastatin 10 mg at home. Will treat with high potency statin  while inpatient. - Atorvastatin 20 mg daily  7. Hypertension:  At home, she takes carvedilol 25 mg twice a day, furosemide 20 mg twice a day, and losartan 25 mg daily. In the setting of acute kidney injury, we will discontinue her furosemide and losartan.   - Continue carvedilol 25 mg twice a day  - Hold losartan and furosemide for now   8. CAD and nonischemic cardiomyopathy:  Heart catheterization in 2007 demonstrated a 40% proximal LAD lesion and a 40% proximal D1 lesion. Echocardiogram from February 2013 demonstrated an ejection fraction of 50-55%, mildly dilated left  ventricle with normal wall motion, normal right ventricular function, and normal pulmonary artery pressures. She is on appropriate pharmacotherapy with aspirin, carvedilol, losartan, and rosuvastatin.   - Continue treatment with aspirin, carvedilol, and a statin; holding losartan in the setting of AKI.   9. Obesity:  Patient's BMI is 41.0 (238.6 pounds and 64 inches).   10. Prophylaxis: heparin TID   Dispo: SNF TODAY.   The patient does have a current PCP Dorise Hiss, ELIZABETH, MD), therefore will be requiring OPC follow-up after discharge.   The patient does not have transportation limitations that hinder transportation to clinic appointments.  .Services Needed at time of discharge: Y = Yes, Blank = No PT: Y  OT: Y  RN:   Equipment:   Other: SNF    LOS: 3 days   Bronson Curb 12/30/2012, 10:39 AM

## 2012-12-31 LAB — URINE CULTURE

## 2013-01-01 LAB — URINE CULTURE

## 2013-01-02 ENCOUNTER — Telehealth: Payer: Self-pay | Admitting: Dietician

## 2013-01-03 NOTE — Clinical Social Work Placement (Addendum)
    Clinical Social Work Department CLINICAL SOCIAL WORK PLACEMENT NOTE 01/03/2013  Patient:  [name], [name]  Account Number:  [id number] Admit date:  12/27/2012  Clinical Social Worker:  Lupita Leash Adalena Abdulla, LCSWA  Date/time:  12/29/2012 05:50 PM  Clinical Social Work is seeking post-discharge placement for this patient at the following level of care:   ASSISTED LIVING/REST HOME   (*CSW will update this form in Epic as items are completed)   12/29/2012  Patient/family provided with [facility] Department of Clinical Social Work's list of facilities offering this level of care within the geographic area requested by the patient (or if unable, by the patient's family).  12/29/2012  Patient/family informed of their freedom to choose among providers that offer the needed level of care, that participate in Medicare, Medicaid or managed care program needed by the patient, have an available bed and are willing to accept the patient.  12/29/2012  Patient/family informed of [facility]' ownership interest in [facility], as well as of the fact that they are under no obligation to receive care at this facility.  PASARR submitted to EDS on  PASARR number received from EDS on   FL2 transmitted to all facilities in geographic area requested by pt/family on   FL2 transmitted to all facilities within larger geographic area on   Patient informed that his/her managed care company has contracts with or will negotiate with  certain facilities, including the following:   Has exhisting PAASRR number    Marijean Bravo- has exhausted her 100 days of coverage     Patient/family informed of bed offers received:  12/30/12 Patient chooses bed at  [facility] Physician recommends and patient chooses bed at    Patient to be transferred to  [facility] on   12/30/12 Patient to be transferred to facility by ambulance Sharin Mons) The following physician request were entered in  Epic:   Additional Comments:DC to [facility] 12/30/12. Patient's daughter was given bed offers under Medicaid Pending Letter of Guarantee - bed offers received from [place] and [place].  Patient's daughter spoke with Dir of Nursing at [facility] and now feels that she wants her mother to return there. She states that her mother's confusion is what makes her not want to return there and she feels that the family is familiar with this facility.  Discussed with patient who agreed to return to [facility].  Letter of Guarantee will be provided to facility after discussion with York Ram, Chiropodist of CSW Dept.  No further CSW needs identified. CSW signing off.

## 2013-01-03 NOTE — Progress Notes (Signed)
Utilization Review Completed.   Takeila Thayne, RN, BSN Nurse Case Manager  [phone]  

## 2013-01-03 NOTE — Clinical Social Work Psychosocial (Addendum)
    Clinical Social Work Department BRIEF PSYCHOSOCIAL ASSESSMENT 01/03/2013  Patient:  [name], [name]     Account Number:  [id number]     Admit date:  12/27/2012  Clinical Social Worker:  Tiburcio Pea  Date/Time:  12/29/2012 06:00 PM  Referred by:  Physician  Date Referred:  12/29/2012 Referred for  SNF Placement   Other Referral:   Interview type:  Other - See comment Other interview type:   Patient and her daughter    PSYCHOSOCIAL DATA Living Status:  FAMILY Admitted from facility:   Level of care:   Primary support name:  [name]  [phone] Primary support relationship to patient:  CHILD, ADULT Degree of support available:   Strong support    CURRENT CONCERNS Current Concerns  Post-Acute Placement   Other Concerns:    SOCIAL WORK ASSESSMENT / PLAN Patient was recently a resident of [facility] and [address].  She returned home with daughter last tuesday after she exhausted her 100 covered of by her insurance. Daughter states that she was doing well at the time of d/c but once home- her condition rapidly deteriorated and she can no longer manage patient at home. Family requested SNF search and patient does not really want to return to [facility]. SNF search initated;  Fl2 placed on chart for MD's signature.   Assessment/plan status:  Psychosocial Support/Ongoing Assessment of Needs Other assessment/ plan:   Information/referral to community resources:   SNF bed list provided to patient and daugher  Aftercare needs for HH/DME also discussed with patient and daughter    PATIENT'S/FAMILY'S RESPONSE TO PLAN OF CARE: Patient is agreeable to short term SNF but stated to CSW that she did not want to go back to [facility]. She stated "they didn't want me to leave there and kept making me sick".  Patient went on to state "they put snakes, dogs and cats in my bed to make me sick."  Patient is aware of person but confused most of the time for place, time and  reality.  Daughter [place] bed [place] county but states family was not unhappy with [facility];  they acknowledge that their mother was unhappy there however.

## 2013-01-10 NOTE — Telephone Encounter (Signed)
Tried to call pt to assist with tranisition of care. Phone number listed is disconnected.

## 2013-02-03 ENCOUNTER — Encounter: Payer: Medicare Other | Admitting: Internal Medicine

## 2013-03-24 ENCOUNTER — Non-Acute Institutional Stay (SKILLED_NURSING_FACILITY): Payer: Medicare Other | Admitting: Internal Medicine

## 2013-03-24 DIAGNOSIS — E1129 Type 2 diabetes mellitus with other diabetic kidney complication: Secondary | ICD-10-CM

## 2013-03-24 DIAGNOSIS — D631 Anemia in chronic kidney disease: Secondary | ICD-10-CM

## 2013-03-24 DIAGNOSIS — N039 Chronic nephritic syndrome with unspecified morphologic changes: Secondary | ICD-10-CM

## 2013-03-24 DIAGNOSIS — I509 Heart failure, unspecified: Secondary | ICD-10-CM

## 2013-03-24 DIAGNOSIS — I150 Renovascular hypertension: Secondary | ICD-10-CM

## 2013-04-05 NOTE — Progress Notes (Signed)
Patient ID: [name], female   DOB: 05-26-1936, 77 y.o.   MRN: [id number]        PROGRESS NOTE  DATE:   03/24/2013    FACILITY:  [facility]   LEVEL OF CARE: SNF  Routine Visit  CHIEF COMPLAINT:  Manage CHF, hypertension and diabetes mellitus.   HISTORY OF PRESENT ILLNESS:  REASSESSMENT OF ONGOING PROBLEM(S):  CHF: Patient complains of  chronic lower extremity swelling.  The patient does not relate significant weight changes, denies sob, DOE, orthopnea, PNDs, pedal edema, palpitations or chest pain.  CHF remains stable.  No complications form the medications being used.   HTN: Patient complains of  chronic lower extremity swelling.  Pt 's HTN remains stable.  Denies CP, sob, DOE, pedal edema, headaches, dizziness or visual disturbances.  No complications from the medications currently being used.  Last BP : 150/90, 144/62.  DM:pt's DM remains stable.  Pt denies polyuria, polydipsia, polyphagia, changes in vision or hypoglycemic episodes.  No complications noted from the medication presently being used.  Last hemoglobin A1c is:  6.7 in 12/2012.    PAST MEDICAL HISTORY : Reviewed.  No changes.  CURRENT MEDICATIONS: Reviewed per [facility]  REVIEW OF SYSTEMS:  GENERAL: no change in appetite, no fatigue, no weight changes, no fever, chills or weakness RESPIRATORY: no cough, SOB, DOE, wheezing, hemoptysis CARDIAC: no chest pain, palpitations, complains of  chronic lower extremity swelling GI: no abdominal pain, diarrhea, constipation, heart burn, nausea or vomiting  PHYSICAL EXAMINATION  VS:  T 98.8      P 76     RR 20      BP 150/90     POX %     WT (Lb) 240  GENERAL: no acute distress, morbidly obese body habitus EYES: conjunctivae normal, sclerae normal, normal eye lids NECK: supple, trachea midline, no neck masses, no thyroid tenderness, no thyromegaly LYMPHATICS: no LAN in the neck, no supraclavicular LAN RESPIRATORY: breathing is even & unlabored, BS CTAB CARDIAC: RRR,  no murmur,no extra heart sounds EDEMA/VARICOSITIES:  +2 bilateral lower extremity edema  ARTERIAL:  pedal pulses nonpalpable  GI: abdomen soft, normal BS, no masses, no tenderness, no hepatomegaly, no splenomegaly PSYCHIATRIC: the patient is alert & oriented to person, affect & behavior appropriate  LABS/RADIOLOGY: 01/2013:  Fasting lipid panel normal.  12/2012:  Hemoglobin 9.5, MCV 90, otherwise CBC normal.  Glucose 134, creatinine 1.48, albumin 2.9, otherwise CMP normal.   ASSESSMENT/PLAN:  Diabetes mellitus.  Well controlled.   Hyperlipidemia.  Well controlled.   Anemia of chronic kidney disease.  Stable.  Renovascular hypertension.  Uncontrolled.  Add hydralazine 15 mg  t.i.d.   CHF.  Adequately compensated.   Chronic kidney disease stage III.  Stable.   Coronary artery disease.   Stable.   I was requested by the facility to assess the patient for her capacity to manage finances.  In my assessment, she has multiple medical problems and is total care dependent and, therefore, is not capable of managing her finances or directing others to do so.     CPT CODE: 52841.

## 2013-04-20 ENCOUNTER — Non-Acute Institutional Stay (SKILLED_NURSING_FACILITY): Payer: Medicare Other | Admitting: Adult Health

## 2013-04-20 ENCOUNTER — Encounter: Payer: Self-pay | Admitting: Adult Health

## 2013-04-20 DIAGNOSIS — F29 Unspecified psychosis not due to a substance or known physiological condition: Secondary | ICD-10-CM

## 2013-04-20 DIAGNOSIS — K5900 Constipation, unspecified: Secondary | ICD-10-CM

## 2013-04-20 DIAGNOSIS — I5022 Chronic systolic (congestive) heart failure: Secondary | ICD-10-CM

## 2013-04-20 DIAGNOSIS — E785 Hyperlipidemia, unspecified: Secondary | ICD-10-CM

## 2013-04-20 DIAGNOSIS — I2510 Atherosclerotic heart disease of native coronary artery without angina pectoris: Secondary | ICD-10-CM

## 2013-04-20 DIAGNOSIS — E119 Type 2 diabetes mellitus without complications: Secondary | ICD-10-CM

## 2013-04-20 DIAGNOSIS — H409 Unspecified glaucoma: Secondary | ICD-10-CM

## 2013-04-20 NOTE — Assessment & Plan Note (Signed)
Is stable will continue asa 81 mg daily  

## 2013-04-20 NOTE — Assessment & Plan Note (Signed)
Is stable will continue lipitor 20 mg daily  

## 2013-04-20 NOTE — Assessment & Plan Note (Signed)
Is stable will continue colace twice daily 

## 2013-04-20 NOTE — Assessment & Plan Note (Signed)
She is stable will continue her seroquel 25 mg twice daily with ativan 0.25 mg every 6 hours as needed for anxiety will continue to monitor her status

## 2013-04-20 NOTE — Progress Notes (Signed)
Subjective:    Patient ID: [name], female    DOB: 1936/10/29, 77 y.o.   MRN: [id number] Chief Complaint  Patient presents with  . Medical Managment of Chronic Issues    HPI  She is doing well there is no significant change in her status there are no reports of any behavioral issues she tolerating her medications without any difficulty. Her lab work remains stable. She is being seen today for management of her chronic illnesses.    Past Medical History  Diagnosis Date  . Coronary artery disease     LHC 05/25/06: No renal artery stenosis, proximal LAD 40%, proximal D1 40%  . Diabetes mellitus     a1c 7.3 in 10/2011  . Hypertension   . Systolic heart failure     Echo 10/09: EF 30-35%. Echo 2/13: EF 50-55%.  Marland Kitchen NICM (nonischemic cardiomyopathy)   . Blood transfusion without reported diagnosis   . Anxiety   . Arthritis   . Chronic diastolic CHF (congestive heart failure)   . GLAUCOMA 12/06/2008    Vision Loss right eye   . 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    . Visual hallucinations 07/15/2011  . Stage III chronic kidney disease 01/10/2007  . OSA (obstructive sleep apnea) 03/04/2012  . HYPERLIPIDEMIA 12/27/2006  . GERD (gastroesophageal reflux disease)    Past Surgical History  Procedure Laterality Date  . Coronary angioplasty with stent placement    . Eye surgery  2012    both eyes    Review of Systems  Constitutional: Negative for appetite change.  Respiratory: Negative for cough and shortness of breath.   Cardiovascular: Positive for leg swelling. Negative for chest pain.  Gastrointestinal: Negative for abdominal pain and constipation.  Musculoskeletal: Negative for myalgias and arthralgias.  Skin: Negative.   Psychiatric/Behavioral: Negative.    Filed Vitals:   04/20/13 1521  BP: 132/79  Pulse: 70  Height: 5\' 4"  (1.626 m)  Weight: 243 lb (110.224 kg)       Objective:   Physical Exam   Constitutional:  Obese   Neck: Neck supple.  Cardiovascular: Normal rate, regular rhythm and intact distal pulses.   Pulmonary/Chest: Effort normal and breath sounds normal.  Abdominal: Soft. Bowel sounds are normal.  Musculoskeletal:  Is able to move extremities has 1-2+ peripheral edema  Neurological: She is alert.  Skin: Skin is warm and dry.  Psychiatric: She has a normal mood and affect.    LAB REVIEWED: 03-07-13: wbc 5.3; hgb 9.5; hct 29.4; 93; plt 307; glucose 134; bun 17; creat 1.48; k+ 4.7;  Na++ 141; liver normal albumin 2.9; hgb a1c 6.7 chol 135; ldl 90; trig 61         Assessment & Plan:    CORONARY ARTERY DISEASE Is stable will continue asa 81 mg daily   HYPERLIPIDEMIA Is stable will continue lipitor 20 mg daily   Chronic systolic heart failure Is stable will continue lasix 20 mg twice daily and will continue to monitor her status   GLAUCOMA Is without change will continue alphagan to both eyes daily   Unspecified constipation Is stable will continue colace twice daily   Psychosis She is stable will continue her seroquel 25 mg twice daily with ativan 0.25 mg every 6 hours as needed for anxiety will continue to monitor her status   DIABETES MELLITUS, TYPE II Without change in her status will continue her lantus at 10 units daily  Time spent with patient 45 minutes

## 2013-04-20 NOTE — Assessment & Plan Note (Signed)
Is stable will continue lasix 20 mg twice daily and will continue to monitor her status

## 2013-04-20 NOTE — Assessment & Plan Note (Signed)
Without change in her status will continue her lantus at 10 units daily

## 2013-04-20 NOTE — Assessment & Plan Note (Signed)
Is without change will continue alphagan to both eyes daily

## 2013-05-31 ENCOUNTER — Non-Acute Institutional Stay (SKILLED_NURSING_FACILITY): Payer: Medicare Other | Admitting: Internal Medicine

## 2013-05-31 DIAGNOSIS — I509 Heart failure, unspecified: Secondary | ICD-10-CM

## 2013-05-31 DIAGNOSIS — E785 Hyperlipidemia, unspecified: Secondary | ICD-10-CM

## 2013-05-31 DIAGNOSIS — I5032 Chronic diastolic (congestive) heart failure: Secondary | ICD-10-CM

## 2013-05-31 DIAGNOSIS — I150 Renovascular hypertension: Secondary | ICD-10-CM

## 2013-05-31 DIAGNOSIS — E1129 Type 2 diabetes mellitus with other diabetic kidney complication: Secondary | ICD-10-CM

## 2013-06-02 DIAGNOSIS — I150 Renovascular hypertension: Secondary | ICD-10-CM | POA: Insufficient documentation

## 2013-06-02 DIAGNOSIS — E1129 Type 2 diabetes mellitus with other diabetic kidney complication: Secondary | ICD-10-CM | POA: Insufficient documentation

## 2013-06-02 NOTE — Progress Notes (Signed)
Patient ID: [name], female   DOB: 02-22-1936, 77 y.o.   MRN: [id number]        PROGRESS NOTE  DATE:   05/31/2013    FACILITY:  [facility]   LEVEL OF CARE: SNF  Routine Visit  CHIEF COMPLAINT:  Manage CHF, hypertension and diabetes mellitus.   HISTORY OF PRESENT ILLNESS:  REASSESSMENT OF ONGOING PROBLEM(S):  CHF: Patient complains of  chronic lower extremity swelling.  The patient does not relate significant weight changes, denies sob, DOE, orthopnea, PNDs, palpitations or chest pain.  CHF remains stable.  No complications form the medications being used. Has chronic lower extremity swelling.  HTN: Patient complains of  chronic lower extremity swelling.  Pt 's HTN remains stable.  Denies CP, sob, DOE, pedal edema, headaches, dizziness or visual disturbances.  No complications from the medications currently being used.  Last BP : 140/80, 150/90, 144/62.  DM:pt's DM remains stable.  Pt denies polyuria, polydipsia, polyphagia, changes in vision or hypoglycemic episodes.  No complications noted from the medication presently being used.  Last hemoglobin A1c is:  6.7 in 12/2012.    PAST MEDICAL HISTORY : Reviewed.  No changes.  CURRENT MEDICATIONS: Reviewed per [facility]  REVIEW OF SYSTEMS:  GENERAL: no change in appetite, no fatigue, no weight changes, no fever, chills or weakness RESPIRATORY: no cough, SOB, DOE, wheezing, hemoptysis CARDIAC: no chest pain, palpitations, complains of  chronic lower extremity swelling GI: no abdominal pain, diarrhea, constipation, heart burn, nausea or vomiting  PHYSICAL EXAMINATION  VS:  T 97.2     P 72     RR 20      BP 140/80     POX %     WT (Lb) 238  GENERAL: no acute distress, morbidly obese body habitus NECK: supple, trachea midline, no neck masses, no thyroid tenderness, no thyromegaly RESPIRATORY: breathing is even & unlabored, BS CTAB CARDIAC: RRR, no murmur,no extra heart sounds EDEMA/VARICOSITIES:  +2 bilateral lower extremity  edema  ARTERIAL:  pedal pulses nonpalpable  GI: abdomen soft, normal BS, no masses, no tenderness, no hepatomegaly, no splenomegaly PSYCHIATRIC: the patient is alert & oriented to person, affect & behavior appropriate  LABS/RADIOLOGY: 01/2013:  Fasting lipid panel normal.  12/2012:  Hemoglobin 9.5, MCV 90, otherwise CBC normal.  Glucose 134, creatinine 1.48, albumin 2.9, otherwise CMP normal.   ASSESSMENT/PLAN:  Diabetes mellitus.  Well controlled.   Hyperlipidemia.  Well controlled.   Anemia of chronic kidney disease.  Stable.  Renovascular hypertension. BP improved.  CHF.  Adequately compensated.   Chronic kidney disease stage III.  Stable.   Coronary artery disease.   Stable.    CPT CODE: 40981.

## 2013-06-12 ENCOUNTER — Non-Acute Institutional Stay (SKILLED_NURSING_FACILITY): Payer: Medicare Other | Admitting: Internal Medicine

## 2013-06-12 DIAGNOSIS — R609 Edema, unspecified: Secondary | ICD-10-CM

## 2013-06-12 DIAGNOSIS — Z79899 Other long term (current) drug therapy: Secondary | ICD-10-CM

## 2013-06-28 ENCOUNTER — Non-Acute Institutional Stay (SKILLED_NURSING_FACILITY): Payer: Medicare Other | Admitting: Internal Medicine

## 2013-06-28 DIAGNOSIS — I150 Renovascular hypertension: Secondary | ICD-10-CM

## 2013-06-28 DIAGNOSIS — I5022 Chronic systolic (congestive) heart failure: Secondary | ICD-10-CM

## 2013-06-28 DIAGNOSIS — E785 Hyperlipidemia, unspecified: Secondary | ICD-10-CM

## 2013-06-28 DIAGNOSIS — E1129 Type 2 diabetes mellitus with other diabetic kidney complication: Secondary | ICD-10-CM

## 2013-06-29 ENCOUNTER — Other Ambulatory Visit: Payer: Self-pay

## 2013-06-29 NOTE — Progress Notes (Addendum)
Patient ID: [name], female   DOB: 06/23/1936, 77 y.o.   MRN: [id number]        PROGRESS NOTE  DATE:   06/28/2013    FACILITY:  [facility]   LEVEL OF CARE: SNF  Routine Visit  CHIEF COMPLAINT:  Manage CHF, hypertension and diabetes mellitus.   HISTORY OF PRESENT ILLNESS:  REASSESSMENT OF ONGOING PROBLEM(S):  CHF: Patient complains of  chronic lower extremity swelling.  The patient does not relate significant weight changes, denies sob, DOE, orthopnea, PNDs, palpitations or chest pain.  CHF remains stable.  No complications form the medications being used.   HTN: Patient complains of  chronic lower extremity swelling.  Pt 's HTN remains stable.  Denies CP, sob, DOE, pedal edema, headaches, dizziness or visual disturbances.  No complications from the medications currently being used.  Last BP :142/80, 140/80, 150/90, 144/62.  DM:pt's DM remains stable.  Pt denies polyuria, polydipsia, polyphagia, changes in vision or hypoglycemic episodes.  No complications noted from the medication presently being used.  Last hemoglobin A1c is:  6.7 in 12/2012.    PAST MEDICAL HISTORY : Reviewed.  No changes.  CURRENT MEDICATIONS: Reviewed per [facility]  REVIEW OF SYSTEMS:  GENERAL: no change in appetite, no fatigue, no weight changes, no fever, chills or weakness RESPIRATORY: no cough, SOB, DOE, wheezing, hemoptysis CARDIAC: no chest pain, palpitations, complains of  chronic lower extremity swelling GI: no abdominal pain, diarrhea, constipation, heart burn, nausea or vomiting  PHYSICAL EXAMINATION  VS:  T 98     P 68    RR 23     BP 142/80     POX %     WT (Lb) 239  GENERAL: no acute distress, morbidly obese body habitus EYES: normal sclerae, no conjunctival erythema, no discharge NECK: supple, trachea midline, no neck masses, no thyroid tenderness, no thyromegaly LYMPHATICS: no cervical LAN, no supraclavicular LAN RESPIRATORY: breathing is even & unlabored, BS CTAB CARDIAC: RRR, no  murmur,no extra heart sounds EDEMA/VARICOSITIES:  +2 bilateral lower extremity edema  ARTERIAL:  pedal pulses nonpalpable  GI: abdomen soft, normal BS, no masses, no tenderness, no hepatomegaly, no splenomegaly PSYCHIATRIC: the patient is alert & oriented to person, affect & behavior appropriate  LABS/RADIOLOGY:  6/14 glc 108, bun 33, cr 1.46 ow bmp nl, TSH 1.17 01/2013:  Fasting lipid panel normal.  12/2012:  Hemoglobin 9.5, MCV 90, otherwise CBC normal.  Glucose 134, creatinine 1.48, albumin 2.9, otherwise CMP normal.   ASSESSMENT/PLAN:  Diabetes mellitus.  Well controlled. Check HbA1c.  Hyperlipidemia.  Well controlled.   Anemia of chronic kidney disease.  Stable.  Recheck Hb.  Renovascular hypertension. Uncontrolled.  Increase hydralazine to 25mg  tid.  CHF.  Adequately compensated.   Chronic kidney disease stage III.  Renal fcns improved.  Coronary artery disease.   Stable.  Check cbc & liver profile.    CPT CODE: 29528.

## 2013-07-05 ENCOUNTER — Non-Acute Institutional Stay (SKILLED_NURSING_FACILITY): Payer: Medicare Other | Admitting: Internal Medicine

## 2013-07-05 DIAGNOSIS — D631 Anemia in chronic kidney disease: Secondary | ICD-10-CM

## 2013-07-05 DIAGNOSIS — N039 Chronic nephritic syndrome with unspecified morphologic changes: Secondary | ICD-10-CM

## 2013-07-05 NOTE — Progress Notes (Signed)
Patient ID: [name], female   DOB: 02/25/1936, 77 y.o.   MRN: [id number]        PROGRESS NOTE  DATE: 06/12/2013  FACILITY:  [facility]  LEVEL OF CARE: SNF (31)  Acute Visit  CHIEF COMPLAINT:  Manage lower extremity swelling.  HISTORY OF PRESENT ILLNESS: I was requested by the staff to assess the patient regarding above problem(s):  EDEMA: The patient's edema is unstable. Staff report that patient has increased lower extremity swelling today and difficulty standing.  Patient is complaining of bilateral foot pain.  Weight was 239 on 05/24/2013 and 238 on 05/18/2013.   Patient denies chest pain or shortness of breath. No complications reported from the medications currently being used. She is currently on Lasix 20 mg b.i.d.  Patient has a history of CHF, nonischemic cardiomyopathy, and chronic kidney disease stage III, as well.   EF was 50-55% in January 2013.    PAST MEDICAL HISTORY : Reviewed.  No changes.  CURRENT MEDICATIONS: Reviewed per [facility]  REVIEW OF SYSTEMS:  GENERAL: no change in appetite, no fatigue, no weight changes, no fever, chills or weakness RESPIRATORY: no cough, SOB, DOE,, wheezing, hemoptysis CARDIAC: no chest pain or palpitations, complains of increased lower extremity swelling GI: no abdominal pain, diarrhea, constipation, heart burn, nausea or vomiting  PHYSICAL EXAMINATION  VS:  T 99.6       P 82      RR 18      BP 130/70    POX 96% room air       WT (Lb)  GENERAL: no acute distress, moderately obese body habitus NECK: supple, trachea midline, no neck masses, no thyroid tenderness, no thyromegaly RESPIRATORY: breathing is even & unlabored, BS CTAB CARDIAC: RRR, no murmur,no extra heart sounds EDEMA/VARICOSITIES:  +3 bilateral lower extremity edema  ARTERIAL:  pedal pulses nonpalpable   GI: abdomen soft, normal BS, no masses, no tenderness, no hepatomegaly, no splenomegaly PSYCHIATRIC: the patient is alert & oriented to person,  affect & behavior appropriate MUSCULOSKELETAL:   EXTREMITIES:  BILATERAL LOWER EXTREMITIES:  Bilateral feet tender to palpation  LABS/RADIOLOGY: 12/2012:  BUN 70, creatinine 1.48.  ASSESSMENT/PLAN:  Lower extremity edema.  Unstable problem.  The edema is worse.  Increase Lasix to 40 mg b.i.d. for four days, then decrease to 20 mg b.i.d.  Check TSH.  Also start KCl 10 mEq q.d. for four days only.    V58.69.  Check BMP on 06/19/2013.    CPT CODE: 40981

## 2013-07-05 NOTE — Progress Notes (Signed)
PROGRESS NOTE  DATE: 07/05/2013  FACILITY:  [facility]  LEVEL OF CARE: SNF (31)  Acute Visit  CHIEF COMPLAINT:  Manage anemia of chronic kidney disease  HISTORY OF PRESENT ILLNESS: I was requested by the staff to assess the patient regarding above problem(s):  ANEMIA: The anemia has been stable. The patient denies fatigue, melena or hematochezia. No complications from the medications currently being used. On 7/10 Hb 9.8, MCV 92. On 7/2 hemoglobin 9.7.  PAST MEDICAL HISTORY : Reviewed.  No changes.  CURRENT MEDICATIONS: Reviewed per [facility]  PHYSICAL EXAMINATION  GENERAL: no acute distress, normal body habitus RESPIRATORY: breathing is even & unlabored, BS CTAB CARDIAC: RRR, no murmur,no extra heart sounds, +2 bilateral lower extremity edema  LABS/RADIOLOGY: See history of present illness  ASSESSMENT/PLAN:  Anemia of chronic kidney disease-stable.  CPT CODE: 30865

## 2013-07-06 DIAGNOSIS — R609 Edema, unspecified: Secondary | ICD-10-CM | POA: Insufficient documentation

## 2013-07-06 DIAGNOSIS — Z79899 Other long term (current) drug therapy: Secondary | ICD-10-CM | POA: Insufficient documentation

## 2013-07-26 ENCOUNTER — Non-Acute Institutional Stay (SKILLED_NURSING_FACILITY): Payer: Medicare Other | Admitting: Internal Medicine

## 2013-07-26 DIAGNOSIS — H5316 Psychophysical visual disturbances: Secondary | ICD-10-CM

## 2013-07-26 DIAGNOSIS — R441 Visual hallucinations: Secondary | ICD-10-CM

## 2013-08-03 ENCOUNTER — Non-Acute Institutional Stay (SKILLED_NURSING_FACILITY): Payer: Medicare Other | Admitting: Internal Medicine

## 2013-08-03 DIAGNOSIS — D631 Anemia in chronic kidney disease: Secondary | ICD-10-CM

## 2013-08-03 DIAGNOSIS — E785 Hyperlipidemia, unspecified: Secondary | ICD-10-CM

## 2013-08-03 DIAGNOSIS — N039 Chronic nephritic syndrome with unspecified morphologic changes: Secondary | ICD-10-CM

## 2013-08-03 DIAGNOSIS — I150 Renovascular hypertension: Secondary | ICD-10-CM

## 2013-08-03 DIAGNOSIS — E1129 Type 2 diabetes mellitus with other diabetic kidney complication: Secondary | ICD-10-CM

## 2013-08-04 NOTE — Progress Notes (Signed)
Patient ID: [name], female   DOB: 10/19/1936, 77 y.o.   MRN: [id number]        PROGRESS NOTE  DATE:   08/03/2013    FACILITY:  [facility]   LEVEL OF CARE: SNF  Routine Visit  CHIEF COMPLAINT:  Manage CHF, hypertension and diabetes mellitus.   HISTORY OF PRESENT ILLNESS:  REASSESSMENT OF ONGOING PROBLEM(S):  CHF: Patient complains of  chronic lower extremity swelling.  The patient does not relate significant weight changes, denies sob, DOE, orthopnea, PNDs, palpitations or chest pain.  CHF remains stable.  No complications form the medications being used.   HTN: Patient complains of  chronic lower extremity swelling.  Pt 's HTN remains stable.  Denies CP, sob, DOE, pedal edema, headaches, dizziness or visual disturbances.  No complications from the medications currently being used.  Last BP :142/80, 140/80, 150/90, 144/62, 144/80.  DM:pt's DM remains stable.  Pt denies polyuria, polydipsia, polyphagia, changes in vision or hypoglycemic episodes.  No complications noted from the medication presently being used.  Last hemoglobin A1c is:  6.7 in 12/2012, in 7/14 hemoglobin A1c 6.6    PAST MEDICAL HISTORY : Reviewed.  No changes.  CURRENT MEDICATIONS: Reviewed per [facility]  REVIEW OF SYSTEMS:  GENERAL: no change in appetite, no fatigue, no weight changes, no fever, chills or weakness RESPIRATORY: no cough, SOB, DOE, wheezing, hemoptysis CARDIAC: no chest pain, palpitations, complains of  chronic lower extremity swelling GI: no abdominal pain, diarrhea, constipation, heart burn, nausea or vomiting  PHYSICAL EXAMINATION  VS:  T 98.4     P 72    RR 20     BP 144/80     POX %     WT (Lb) 242  GENERAL: no acute distress, morbidly obese body habitus EYES: normal sclerae, no conjunctival erythema, no discharge NECK: supple, trachea midline, no neck masses, no thyroid tenderness, no thyromegaly LYMPHATICS: no cervical LAN, no supraclavicular LAN RESPIRATORY: breathing is even &  unlabored, BS CTAB CARDIAC: RRR, no murmur,no extra heart sounds EDEMA/VARICOSITIES:  +2 bilateral lower extremity edema  ARTERIAL:  pedal pulses nonpalpable  GI: abdomen soft, normal BS, no masses, no tenderness, no hepatomegaly, no splenomegaly PSYCHIATRIC: the patient is alert & oriented to person, affect & behavior appropriate  LABS/RADIOLOGY:  7-14 hemoglobin 9.8, MCV 92 otherwise CBC normal, albumin 3.2 otherwise liver profile normal  6/14 glc 108, bun 33, cr 1.46 ow bmp nl, TSH 1.17 01/2013:  Fasting lipid panel normal.  12/2012:  Hemoglobin 9.5, MCV 90, otherwise CBC normal.  Glucose 134, creatinine 1.48, albumin 2.9, otherwise CMP normal.   ASSESSMENT/PLAN:  Diabetes mellitus.  Well controlled.  Hyperlipidemia.  Well controlled. Check fasting lipid panel  Anemia of chronic kidney disease.  Stable.  Renovascular hypertension. Uncontrolled.  Increase hydralazine to 50mg  tid.  CHF.  Adequately compensated.   Chronic kidney disease stage III.  Renal fcns improved.  Coronary artery disease.   Stable.    CPT CODE: 86578.

## 2013-08-07 ENCOUNTER — Non-Acute Institutional Stay (SKILLED_NURSING_FACILITY): Payer: Medicare Other | Admitting: Internal Medicine

## 2013-08-07 DIAGNOSIS — N390 Urinary tract infection, site not specified: Secondary | ICD-10-CM

## 2013-08-12 ENCOUNTER — Non-Acute Institutional Stay (SKILLED_NURSING_FACILITY): Payer: Medicare Other | Admitting: Internal Medicine

## 2013-08-12 DIAGNOSIS — M109 Gout, unspecified: Secondary | ICD-10-CM

## 2013-08-15 NOTE — Progress Notes (Signed)
Patient ID: [name], female   DOB: April 11, 1936, 77 y.o.   MRN: [id number]           PROGRESS NOTE  DATE:  08/11/2013  FACILITY: [facility]   LEVEL OF CARE:   SNF   Acute Visit   CHIEF COMPLAINT:  Lower extremity pain.    HISTORY OF PRESENT ILLNESS:  This patient tells me that she has had pain in her bilateral feet, making it difficult for her to weightbear.  This just started over the last few days.  The patient thinks she has gout.  However, I do not see this on her problem list.  She tells me she may have had gouty attacks in the remote past.    PHYSICAL EXAMINATION:   MUSCULOSKELETAL:   EXTREMITIES:   BILATERAL LOWER EXTREMITIES:  Bilateral feet and ankles:  Her peripheral pulses are difficult to feel due to mild swelling.  However, this does not look to be an ischemic situation.  She has no evidence of a DVT.  She appears to have significant tenderness in her bilateral metatarsophalangeals.  There may be also some tenderness, warmth and swelling in her bilateral ankles.     ASSESSMENT/PLAN:  Question acute/subacute gout.  I am going to give this lady colchicine.  There does not appear to be enough fluid in any of her joints to consider a tap.  Although I have no doubt she might have peripheral vascular disease, I am really doubtful that this is the cause of her pain and swelling.    CPT CODE: 78295

## 2013-08-16 ENCOUNTER — Non-Acute Institutional Stay (SKILLED_NURSING_FACILITY): Payer: Medicare Other | Admitting: Adult Health

## 2013-08-16 ENCOUNTER — Encounter: Payer: Self-pay | Admitting: Adult Health

## 2013-08-16 DIAGNOSIS — M109 Gout, unspecified: Secondary | ICD-10-CM

## 2013-08-16 MED ORDER — COLCHICINE 0.6 MG PO TABS: 0.6000 mg | ORAL_TABLET | Freq: Two times a day (BID) | ORAL | Status: DC

## 2013-08-16 NOTE — Progress Notes (Signed)
Patient ID: [name], female   DOB: 1936-12-12, 77 y.o.   MRN: [id number]        PROGRESS NOTE  DATE: 07/26/2013  FACILITY:  [facility]  LEVEL OF CARE: SNF (31)  Acute Visit  CHIEF COMPLAINT:  Manage hallucinations.    HISTORY OF PRESENT ILLNESS: I was requested by the staff to assess the patient regarding above problem(s):  Staff report that patient is noted to be having increasing hallucinations and irritability for several days.  Patient denies any acute problems.  Staff cannot identify precipitating or alleviating factors.  There is no temporal relationship.  There are no other associated signs and symptoms.    PAST MEDICAL HISTORY : Reviewed.  No changes.  CURRENT MEDICATIONS: Reviewed per [facility]  REVIEW OF SYSTEMS:  GENERAL: no change in appetite, no fatigue, no weight changes, no fever, chills or weakness RESPIRATORY: no cough, SOB, DOE,, wheezing, hemoptysis CARDIAC: no chest pain or palpitations;  chronic lower extremity swelling   GI: no abdominal pain, diarrhea, constipation, heart burn, nausea or vomiting  PHYSICAL EXAMINATION  GENERAL: no acute distress, moderately obese body habitus NECK: supple, trachea midline, no neck masses, no thyroid tenderness, no thyromegaly RESPIRATORY: breathing is even & unlabored, BS CTAB CARDIAC: RRR, no murmur,no extra heart sounds EDEMA/VARICOSITIES:  +2 bilateral lower extremity edema  ARTERIAL:  pedal pulses nonpalpable   GI: abdomen soft, normal BS, no masses, no tenderness, no hepatomegaly, no splenomegaly PSYCHIATRIC: the patient is alert & oriented to person, affect & behavior appropriate  LABS/RADIOLOGY: 06/2013:  Hemoglobin 9.8, MCV 92, otherwise CBC normal.    Albumin 3.2, otherwise liver profile normal.    05/2013:  Glucose 108, BUN 33, creatinine 1.__, otherwise BMP normal.    TSH 1.17.    ASSESSMENT/PLAN:  Hallucinations.  New problem.  Check urine culture and sensitivities.    CPT  CODE: 40981

## 2013-08-16 NOTE — Progress Notes (Signed)
Patient ID: BATOUL Bowman, female   DOB: October 05, 1936, 77 y.o.   MRN: [id number]  [facility]  Allergies  Allergen Reactions  . Lisinopril     REACTION: cough     Chief Complaint  Patient presents with  . Acute Visit    gout    HPI:  Pt is followed for medical management for chronic illnesses.  Pt reports acute tenderness to bilat great toe last week.  Pt endorses associated swelling to great toes and BLE.  Pt reports consuming a diet rich in pork , refined sweets, juice and fried foods prior to onset.  Pt reports rest, relaxation, and colchicine helped alleviate symptoms.  Past Medical History  Diagnosis Date  . Coronary artery disease     LHC 05/25/06: No renal artery stenosis, proximal LAD 40%, proximal D1 40%  . Diabetes mellitus     a1c 7.3 in 10/2011  . Hypertension   . Systolic heart failure     Echo 10/09: EF 30-35%. Echo 2/13: EF 50-55%.  Marland Kitchen NICM (nonischemic cardiomyopathy)   . Blood transfusion without reported diagnosis   . Anxiety   . Arthritis   . Chronic diastolic CHF (congestive heart failure)   . GLAUCOMA 12/06/2008    Vision Loss right eye   . 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    . Visual hallucinations 07/15/2011  . Stage III chronic kidney disease 01/10/2007  . OSA (obstructive sleep apnea) 03/04/2012  . HYPERLIPIDEMIA 12/27/2006  . GERD (gastroesophageal reflux disease)     Past Surgical History  Procedure Laterality Date  . Coronary angioplasty with stent placement    . Eye surgery  2012    both eyes    VITAL SIGNS BP 124/70  Pulse 70  Temp(Src) 97.5 F (36.4 C)  Resp 20  Wt 245 lb (111.131 kg)  BMI 42.03 kg/m2   Patient's Medications  New Prescriptions   No medications on file  Previous Medications   ASPIRIN EC 81 MG TABLET    Take 1 tablet (81 mg total) by mouth daily.   ATORVASTATIN (LIPITOR) 20 MG TABLET    Take 1 tablet (20 mg total) by mouth daily.   BRIMONIDINE  (ALPHAGAN) 0.2 % OPHTHALMIC SOLUTION    Place 1 drop into both eyes daily.   CARVEDILOL (COREG) 25 MG TABLET    Take 1 tablet (25 mg total) by mouth 2 (two) times daily with a meal.   DOCUSATE SODIUM 100 MG CAPS    Take 100 mg by mouth 2 (two) times daily.   FUROSEMIDE (LASIX) 20 MG TABLET    Take 20 mg by mouth 2 (two) times daily.   GLUCOSE BLOOD TEST STRIP    1 each by Other route 2 (two) times daily.   HYDRALAZINE (APRESOLINE) 10 MG TABLET    Take 50 mg by mouth 3 (three) times daily.    INSULIN GLARGINE (LANTUS) 100 UNIT/ML INJECTION    Inject 10 Units into the skin daily.   LORAZEPAM (ATIVAN) 0.5 MG TABLET    Take 0.25 mg by mouth every 6 (six) hours as needed. For anxiety and/or exercise.   QUETIAPINE (SEROQUEL) 25 MG TABLET    Take 25 mg by mouth 2 (two) times daily.  Modified Medications   No medications on file  Discontinued Medications   No medications on file    SIGNIFICANT DIAGNOSTIC EXAMS     LAB REVIEWED: 03-07-13: wbc 5.3; hgb 9.5; hct 29.4; 93;  plt 307; glucose 134; bun 17; creat 1.48; k+ 4.7;  Na++ 141; liver normal albumin 2.9; hgb a1c 6.7 chol 135; ldl 90; trig 61 08-14-13: uric acid 11.7mg /dL   Review of Systems  Constitutional: Negative.   HENT: Negative.   Eyes:       History of glaucoma/ diabetic retinopathy and vision loss  Respiratory: Positive for shortness of breath.        With exertion  Cardiovascular: Positive for leg swelling.  Gastrointestinal: Negative.   Genitourinary: Negative.   Musculoskeletal: Positive for myalgias and joint pain.       H/o Tenderness upon palpation of bilat great toes one week ago. Acute discomfort resolved per patient  Skin: Negative.   Neurological: Negative.   Endo/Heme/Allergies:       H/o DM  Psychiatric/Behavioral: Negative.    Physical Exam  Constitutional: She is oriented to person, place, and time. She appears well-nourished.  HENT:  Head: Normocephalic.  Neck: Normal range of motion.  Cardiovascular:  Normal rate and regular rhythm.   Respiratory: Effort normal and breath sounds normal.  GI: Soft.  Musculoskeletal:  No tenderness noted upon palpation of bilat great toe, patella, elbow or shoulder  Neurological: She is alert and oriented to person, place, and time.  Skin: Skin is warm and dry.  No tophi noted to skin; Feet are dry, ashen and intact      ASSESSMENT/ PLAN: Gout Colcichine 0.6 mg twice a day for 2 weeks Recheck Uric acid and BMP in 2 weeks Limit dietary intake of fried foods, sweets, red meats, and seafood due to elevated uric acid. Will continue to monitor patient and collaborate with health care team to evaluate effectiveness of treatment plan.

## 2013-08-31 DIAGNOSIS — N390 Urinary tract infection, site not specified: Secondary | ICD-10-CM | POA: Insufficient documentation

## 2013-08-31 NOTE — Progress Notes (Signed)
Patient ID: [name], female   DOB: 02/26/1936, 77 y.o.   MRN: [id number]        PROGRESS NOTE  DATE: 08/07/2013  FACILITY:  [facility]  LEVEL OF CARE: SNF (31)  Acute Visit  CHIEF COMPLAINT:  Manage UTI.    HISTORY OF PRESENT ILLNESS: I was requested by the staff to assess the patient regarding above problem(s):  On 08/02/2013, patient's showed trace WBC esterase, WBCs 0-5, and RBCs 0-2.  Patient's urine culture grows E.coli significantly.  Patient denies suprapubic pain, flank pain, or dysuria.    PAST MEDICAL HISTORY : Reviewed.  No changes.  CURRENT MEDICATIONS: Reviewed per [facility]  REVIEW OF SYSTEMS:  GENERAL: no change in appetite, no fatigue, no weight changes, no fever, chills or weakness RESPIRATORY: no cough, SOB, DOE,, wheezing, hemoptysis CARDIAC: no chest pain or palpitations;  chronic lower extremity swelling   GI: no abdominal pain, diarrhea, constipation, heart burn, nausea or vomiting  PHYSICAL EXAMINATION  GENERAL: no acute distress, morbidly obese body habitus NECK: supple, trachea midline, no neck masses, no thyroid tenderness, no thyromegaly RESPIRATORY: breathing is even & unlabored, BS CTAB CARDIAC: RRR, no murmur,no extra heart sounds   EDEMA/VARICOSITIES:  +2 bilateral lower extremity edema  ARTERIAL:  pedal pulses nonpalpable   GI: abdomen soft, normal BS, no masses, no tenderness, no hepatomegaly, no splenomegaly PSYCHIATRIC: the patient is alert & oriented to person, affect & behavior appropriate  ASSESSMENT/PLAN:  UTI.  New problem.  Start Bactrim 1 tablet b.i.d. for 7 days and probiotics b.i.d. for 10 days.    CPT CODE: 95621

## 2013-09-01 ENCOUNTER — Non-Acute Institutional Stay (SKILLED_NURSING_FACILITY): Payer: Medicare Other | Admitting: Internal Medicine

## 2013-09-01 DIAGNOSIS — I150 Renovascular hypertension: Secondary | ICD-10-CM

## 2013-09-01 DIAGNOSIS — I509 Heart failure, unspecified: Secondary | ICD-10-CM

## 2013-09-01 DIAGNOSIS — E1129 Type 2 diabetes mellitus with other diabetic kidney complication: Secondary | ICD-10-CM

## 2013-09-01 DIAGNOSIS — I5032 Chronic diastolic (congestive) heart failure: Secondary | ICD-10-CM

## 2013-09-02 NOTE — Progress Notes (Signed)
Patient ID: [name], female   DOB: August 04, 1936, 77 y.o.   MRN: [id number]        PROGRESS NOTE  DATE:   09/01/2013    FACILITY:  [facility]   LEVEL OF CARE: SNF  Routine Visit  CHIEF COMPLAINT:  Manage chronic kidney disease stage III, CHF, hypertension and diabetes mellitus.   HISTORY OF PRESENT ILLNESS:  REASSESSMENT OF ONGOING PROBLEM(S):  CHRONIC KIDNEY DISEASE: The patient's chronic kidney disease is unstable.  Patient denies increasing lower extremity swelling or confusion. Last BUN and creatinine are: 43-1.76 on 08-30-13. In  6-14 BUN 33/creatinine 1.46.  CHF: Patient complains of  chronic lower extremity swelling.  The patient does not relate significant weight changes, denies sob, DOE, orthopnea, PNDs, palpitations or chest pain.  CHF remains stable.  No complications form the medications being used.   HTN: Patient complains of  chronic lower extremity swelling.  Pt 's HTN remains stable.  Denies CP, sob, DOE, pedal edema, headaches, dizziness or visual disturbances.  No complications from the medications currently being used.  Last BP :142/80, 140/80, 150/90, 144/62, 144/80, 136/80.  DM:pt's DM remains stable.  Pt denies polyuria, polydipsia, polyphagia, changes in vision or hypoglycemic episodes.  No complications noted from the medication presently being used.  Last hemoglobin A1c is:  6.7 in 12/2012, in 7/14 hemoglobin A1c 6.6    PAST MEDICAL HISTORY : Reviewed.  No changes.  CURRENT MEDICATIONS: Reviewed per [facility]  REVIEW OF SYSTEMS:  GENERAL: no change in appetite, no fatigue, no weight changes, no fever, chills or weakness RESPIRATORY: no cough, SOB, DOE, wheezing, hemoptysis CARDIAC: no chest pain, palpitations, complains of  chronic lower extremity swelling GI: no abdominal pain, diarrhea, constipation, heart burn, nausea or vomiting  PHYSICAL EXAMINATION  VS:  T 96.6    P 66    RR 22     BP 136/80     POX %     WT (Lb) 245  GENERAL: no acute distress,  morbidly obese body habitus EYES: normal sclerae, no conjunctival erythema, no discharge NECK: supple, trachea midline, no neck masses, no thyroid tenderness, no thyromegaly LYMPHATICS: no cervical LAN, no supraclavicular LAN RESPIRATORY: breathing is even & unlabored, BS CTAB CARDIAC: RRR, no murmur,no extra heart sounds EDEMA/VARICOSITIES:  +2 bilateral lower extremity edema  ARTERIAL:  pedal pulses nonpalpable  GI: abdomen soft, normal BS, no masses, no tenderness, no hepatomegaly, no splenomegaly PSYCHIATRIC: the patient is alert & oriented to person, affect & behavior appropriate  LABS/RADIOLOGY:  9-14 BUN 43, creatinine 1.76 otherwise BMP normal, HDL 39 otherwise Fasting lipid panel normal, uric acid 15.1  7-14 hemoglobin 9.8, MCV 92 otherwise CBC normal, albumin 3.2 otherwise liver profile normal  6/14 glc 108, bun 33, cr 1.46 ow bmp nl, TSH 1.17 01/2013:  Fasting lipid panel normal.  12/2012:  Hemoglobin 9.5, MCV 90, otherwise CBC normal.  Glucose 134, creatinine 1.48, albumin 2.9, otherwise CMP normal.   ASSESSMENT/PLAN:  Diabetes mellitus.  Well controlled.  Hyperlipidemia.  Well controlled.   Anemia of chronic kidney disease.  Stable.  Renovascular hypertension. Improved  CHF.  Adequately compensated.   Chronic kidney disease stage III.  Renal fcns increased. Patient is on Lasix. Recheck  Coronary artery disease.   Stable.   Gout-colchicine was started. Uric acid level is increased. Recheck.   CPT CODE: 78295.

## 2013-09-07 ENCOUNTER — Non-Acute Institutional Stay (SKILLED_NURSING_FACILITY): Payer: Medicare Other | Admitting: Internal Medicine

## 2013-09-07 DIAGNOSIS — M109 Gout, unspecified: Secondary | ICD-10-CM

## 2013-09-14 ENCOUNTER — Non-Acute Institutional Stay (SKILLED_NURSING_FACILITY): Payer: Medicare Other | Admitting: Internal Medicine

## 2013-09-14 DIAGNOSIS — M109 Gout, unspecified: Secondary | ICD-10-CM | POA: Insufficient documentation

## 2013-09-14 NOTE — Progress Notes (Signed)
PROGRESS NOTE  DATE: 09/14/2013  FACILITY:  [facility]  LEVEL OF CARE: SNF (31)  Acute Visit  CHIEF COMPLAINT:  Manage bilateral feet pain  HISTORY OF PRESENT ILLNESS: I was requested by the staff to assess the patient regarding above problem(s):  Patient was having bilateral feet pain especially toes.  The pain became very CVF that she was unable to evaluate and participate in physical therapy. On 08-31-13 uric acid level was 15.1 and on 09-12-13 uric acid level was 14.6. Initially she was treated with colchicine without any relief of her pain. Therefore she was placed on a prednisone taper which she just completed. Today she denies feet pain.  PAST MEDICAL HISTORY : Reviewed.  No changes.  CURRENT MEDICATIONS: Reviewed per [facility]  REVIEW OF SYSTEMS:  GENERAL: no change in appetite, no fatigue, no weight changes, no fever, chills or weakness RESPIRATORY: no cough, SOB, DOE,, wheezing, hemoptysis CARDIAC: no chest pain, or palpitations. chronic lower extremity swelling present GI: no abdominal pain, diarrhea, constipation, heart burn, nausea or vomiting  PHYSICAL EXAMINATION  GENERAL: no acute distress, normal body habitus RESPIRATORY: breathing is even & unlabored, BS CTAB CARDIAC: RRR, no murmur,no extra heart sounds, +2 bilateral lower extremity edema GI: abdomen soft, normal BS, no masses, no tenderness, no hepatomegaly, no splenomegaly PSYCHIATRIC: the patient is alert & oriented to person, affect & behavior appropriate MUSCULOSKELETAL: Bilateral feet nontender to palpation  LABS/RADIOLOGY:  09-11-13 bilateral feet x-ray did not show any acute findings, positive degenerative joint disease 08-31-13 BUN 43, creatinine 1.76  ASSESSMENT/PLAN:  Gout-it appears that her acute attack is resolved. But uric acid level is still elevated.  Therefore start Allopurinol 200 mg daily.  CPT CODE: 40981

## 2013-10-05 ENCOUNTER — Non-Acute Institutional Stay (SKILLED_NURSING_FACILITY): Payer: Medicare Other | Admitting: Internal Medicine

## 2013-10-05 DIAGNOSIS — E1129 Type 2 diabetes mellitus with other diabetic kidney complication: Secondary | ICD-10-CM

## 2013-10-05 NOTE — Progress Notes (Signed)
Patient ID: [name], female   DOB: 25-Feb-1936, 77 y.o.   MRN: [id number]        PROGRESS NOTE  DATE: 09/07/2013  FACILITY:  [facility]  LEVEL OF CARE: SNF (31)  Acute Visit  CHIEF COMPLAINT:  Manage bilateral feet pain.    HISTORY OF PRESENT ILLNESS: I was requested by the staff to assess the patient regarding above problem(s):  Staff report that patient continues to have exquisite tenderness in her feet, especially big toes.   A uric acid level was 15.1 on 08/30/2013 and in August a uric acid level was 11.7.   The patient is on colchicine, but no response to treatment.  Patient does complain of pain in bilateral feet.  She cannot identify precipitating or alleviating factors.  There is no temporal relationship.  There are no other associated signs and symptoms.    PAST MEDICAL HISTORY : Reviewed.  No changes.  CURRENT MEDICATIONS: Reviewed per [facility]  REVIEW OF SYSTEMS:  GENERAL: no change in appetite, no fatigue, no weight changes, no fever, chills or weakness RESPIRATORY: no cough, SOB, DOE,, wheezing, hemoptysis CARDIAC: no chest pain or palpitations;  chronic lower extremity swelling   GI: no abdominal pain, diarrhea, constipation, heart burn, nausea or vomiting  PHYSICAL EXAMINATION  GENERAL: no acute distress, morbidly obese body habitus NECK: supple, trachea midline, no neck masses, no thyroid tenderness, no thyromegaly RESPIRATORY: breathing is even & unlabored, BS CTAB CARDIAC: RRR, no murmur,no extra heart sounds EDEMA/VARICOSITIES:  +2 bilateral edema  ARTERIAL:  pedal pulses +1   GI: abdomen soft, normal BS, no masses, no tenderness, no hepatomegaly, no splenomegaly PSYCHIATRIC: the patient is alert & oriented to person, affect & behavior appropriate MUSCULOSKELETAL:   EXTREMITIES:   BILATERAL LOWER EXTREMITIES: bilateral feet, especially great toes, tender to palpation; but there is no erythema, edema, or warmth     ASSESSMENT/PLAN:  Gout in bilateral feet.  Nonresponsive to colchicine.  Unstable problem.   Start prednisone 30 mg q.d. for three days, then 20 mg q.d. for three days, then 10 mg q.d. for three days, then discontinue.    CPT CODE: 30865

## 2013-10-12 ENCOUNTER — Non-Acute Institutional Stay (SKILLED_NURSING_FACILITY): Payer: Medicare Other | Admitting: Internal Medicine

## 2013-10-12 DIAGNOSIS — D631 Anemia in chronic kidney disease: Secondary | ICD-10-CM

## 2013-10-12 DIAGNOSIS — E1129 Type 2 diabetes mellitus with other diabetic kidney complication: Secondary | ICD-10-CM

## 2013-10-12 DIAGNOSIS — E785 Hyperlipidemia, unspecified: Secondary | ICD-10-CM

## 2013-10-12 DIAGNOSIS — I150 Renovascular hypertension: Secondary | ICD-10-CM

## 2013-10-13 DIAGNOSIS — E1129 Type 2 diabetes mellitus with other diabetic kidney complication: Secondary | ICD-10-CM | POA: Insufficient documentation

## 2013-10-13 NOTE — Progress Notes (Signed)
Patient ID: [name], female   DOB: 1936-02-28, 77 y.o.   MRN: [id number]        PROGRESS NOTE  DATE:   10/12/2013    FACILITY:  [facility]   LEVEL OF CARE: SNF  Routine Visit  CHIEF COMPLAINT:  Manage chronic kidney disease stage III, CHF, hypertension and diabetes mellitus.   HISTORY OF PRESENT ILLNESS:  REASSESSMENT OF ONGOING PROBLEM(S):  CHRONIC KIDNEY DISEASE: The patient's chronic kidney disease is unstable.  Patient denies increasing lower extremity swelling or confusion. Last BUN and creatinine are: 43-1.76 on 08-30-13. In  6-14 BUN 33/creatinine 1.46.  CHF: Patient complains of  chronic lower extremity swelling.  The patient does not relate significant weight changes, denies sob, DOE, orthopnea, PNDs, palpitations or chest pain.  CHF remains stable.  No complications form the medications being used.   HTN: Patient complains of  chronic lower extremity swelling.  Pt 's HTN remains stable.  Denies CP, sob, DOE, pedal edema, headaches, dizziness or visual disturbances.  No complications from the medications currently being used.  Last BP :142/80, 140/80, 150/90, 144/62, 144/80, 136/80, 140/80.  DM:pt's DM remains stable.  Pt denies polyuria, polydipsia, polyphagia, changes in vision or hypoglycemic episodes.  No complications noted from the medication presently being used.  Last hemoglobin A1c is:  6.7 in 12/2012, in 7/14 hemoglobin A1c 6.6, in 10-14 HbA1c 7.5  PAST MEDICAL HISTORY : Reviewed.  No changes.  CURRENT MEDICATIONS: Reviewed per [facility]  REVIEW OF SYSTEMS:  GENERAL: no change in appetite, no fatigue, no weight changes, no fever, chills or weakness RESPIRATORY: no cough, SOB, DOE, wheezing, hemoptysis CARDIAC: no chest pain, palpitations, complains of  chronic lower extremity swelling GI: no abdominal pain, diarrhea, constipation, heart burn, nausea or vomiting  PHYSICAL EXAMINATION  VS:  T 98.9    P 80    RR 22     BP 140/80     POX %     WT (Lb)  245  GENERAL: no acute distress, morbidly obese body habitus EYES: normal sclerae, no conjunctival erythema, no discharge NECK: supple, trachea midline, no neck masses, no thyroid tenderness, no thyromegaly LYMPHATICS: no cervical LAN, no supraclavicular LAN RESPIRATORY: breathing is even & unlabored, BS CTAB CARDIAC: RRR, no murmur,no extra heart sounds EDEMA/VARICOSITIES:  +2 bilateral lower extremity edema  ARTERIAL:  pedal pulses nonpalpable  GI: abdomen soft, normal BS, no masses, no tenderness, no hepatomegaly, no splenomegaly PSYCHIATRIC: the patient is alert & oriented to person, affect & behavior appropriate  LABS/RADIOLOGY:  9-14 BUN 43, creatinine 1.76 otherwise BMP normal, HDL 39 otherwise Fasting lipid panel normal, uric acid 15.1  7-14 hemoglobin 9.8, MCV 92 otherwise CBC normal, albumin 3.2 otherwise liver profile normal  6/14 glc 108, bun 33, cr 1.46 ow bmp nl, TSH 1.17 01/2013:  Fasting lipid panel normal.  12/2012:  Hemoglobin 9.5, MCV 90, otherwise CBC normal.  Glucose 134, creatinine 1.48, albumin 2.9, otherwise CMP normal.   ASSESSMENT/PLAN:   Diabetes mellitus.  Uncontrolled.  Lantus was increased.  Hyperlipidemia.  Well controlled.   Anemia of chronic kidney disease.  Stable.  Renovascular hypertension. BP borderline.  CHF.  Adequately compensated.   Chronic kidney disease stage III.  Renal fcns increased. Patient is on Lasix. Recheck  Coronary artery disease.   Stable.   Gout-allopurinol was started.   CPT CODE: 78295.

## 2013-10-27 ENCOUNTER — Other Ambulatory Visit: Payer: Self-pay | Admitting: *Deleted

## 2013-10-27 MED ORDER — LORAZEPAM 0.5 MG PO TABS: ORAL_TABLET | ORAL | Status: DC

## 2013-10-29 NOTE — Progress Notes (Signed)
Patient ID: [name], female   DOB: [birth year], 77 y.o.   MRN: [id number]        PROGRESS NOTE  DATE: 10/05/2013  FACILITY:  [facility]  LEVEL OF CARE: SNF (31)  Acute Visit  CHIEF COMPLAINT:  Manage diabetes mellitus.    HISTORY OF PRESENT ILLNESS: I was requested by the staff to assess the patient regarding above problem(s):  DM:pt's DM is unstable.  Pt denies polyuria, polydipsia, polyphagia, changes in vision or hypoglycemic episodes.  No complications noted from the medication presently being used.  Last hemoglobin A1c is:  7.5 on 09/28/2013.    PAST MEDICAL HISTORY : Reviewed.  No changes.  CURRENT MEDICATIONS: Reviewed per [facility]  REVIEW OF SYSTEMS:  GENERAL: no change in appetite, no fatigue, no weight changes, no fever, chills or weakness RESPIRATORY: no cough, SOB, DOE,, wheezing, hemoptysis CARDIAC: no chest pain or palpitations;  chronic lower extremity swelling   GI: no abdominal pain, diarrhea, constipation, heart burn, nausea or vomiting  PHYSICAL EXAMINATION  GENERAL: no acute distress, morbidly obese body habitus NECK: supple, trachea midline, no neck masses, no thyroid tenderness, no thyromegaly RESPIRATORY: breathing is even & unlabored, BS CTAB CARDIAC: RRR, no murmur,no extra heart sounds EDEMA/VARICOSITIES:  +2 bilateral lower extremity edema  ARTERIAL:  pedal pulses nonpalpable   GI: abdomen soft, normal BS, no masses, no tenderness, no hepatomegaly, no splenomegaly PSYCHIATRIC: the patient is alert & oriented to person, affect & behavior appropriate  ASSESSMENT/PLAN:  Diabetes mellitus with renal complications.  Uncontrolled problem.  Increase Lantus to 15 U q.h.s.    CPT CODE: 40981

## 2013-11-09 ENCOUNTER — Encounter: Payer: Self-pay | Admitting: Internal Medicine

## 2013-11-09 ENCOUNTER — Non-Acute Institutional Stay (SKILLED_NURSING_FACILITY): Payer: Medicare Other | Admitting: Internal Medicine

## 2013-11-09 DIAGNOSIS — I5032 Chronic diastolic (congestive) heart failure: Secondary | ICD-10-CM

## 2013-11-09 DIAGNOSIS — I509 Heart failure, unspecified: Secondary | ICD-10-CM

## 2013-11-09 DIAGNOSIS — I150 Renovascular hypertension: Secondary | ICD-10-CM

## 2013-11-09 DIAGNOSIS — E1129 Type 2 diabetes mellitus with other diabetic kidney complication: Secondary | ICD-10-CM

## 2013-11-09 NOTE — Progress Notes (Signed)
Patient ID: [name], female   DOB: Mar 24, 1936, 77 y.o.   MRN: [id number]        PROGRESS NOTE  DATE:   11/09/2013    FACILITY:  [facility]   LEVEL OF CARE: SNF  Routine Visit  CHIEF COMPLAINT:  Manage chronic kidney disease stage III, CHF, hypertension and diabetes mellitus.   HISTORY OF PRESENT ILLNESS:  REASSESSMENT OF ONGOING PROBLEM(S):  CHRONIC KIDNEY DISEASE: The patient's chronic kidney disease is unstable.  Patient denies increasing lower extremity swelling or confusion. Last BUN and creatinine are: 43-1.76 on 08-30-13. In  6-14 BUN 33/creatinine 1.46.  CHF: Patient complains of  chronic lower extremity swelling.  The patient does not relate significant weight changes, denies sob, DOE, orthopnea, PNDs, palpitations or chest pain.  CHF remains stable.  No complications form the medications being used.   HTN: Patient complains of  chronic lower extremity swelling.  Pt 's HTN remains stable.  Denies CP, sob, DOE, pedal edema, headaches, dizziness or visual disturbances.  No complications from the medications currently being used.  Last BP :142/80, 140/80, 150/90, 144/62, 144/80, 136/80, 140/80.  DM:pt's DM remains stable.  Pt denies polyuria, polydipsia, polyphagia, changes in vision or hypoglycemic episodes.  No complications noted from the medication presently being used.  Last hemoglobin A1c is:  6.7 in 12/2012, in 7/14 hemoglobin A1c 6.6, in 10-14 HbA1c 7.5  PAST MEDICAL HISTORY : Reviewed.  No changes.  CURRENT MEDICATIONS: Reviewed per [facility]  REVIEW OF SYSTEMS:  GENERAL: no change in appetite, no fatigue, no weight changes, no fever, chills or weakness RESPIRATORY: no cough, SOB, DOE, wheezing, hemoptysis CARDIAC: no chest pain, palpitations, complains of  chronic lower extremity swelling GI: no abdominal pain, diarrhea, constipation, heart burn, nausea or vomiting  PHYSICAL EXAMINATION  VS:  T 98.8    P 7 and a 0    RR 22     BP 140/80     POX %     WT (Lb)  245  GENERAL: no acute distress, morbidly obese body habitus EYES: normal sclerae, no conjunctival erythema, no discharge NECK: supple, trachea midline, no neck masses, no thyroid tenderness, no thyromegaly LYMPHATICS: no cervical LAN, no supraclavicular LAN RESPIRATORY: breathing is even & unlabored, BS CTAB CARDIAC: RRR, no murmur,no extra heart sounds EDEMA/VARICOSITIES:  +2 bilateral lower extremity edema  ARTERIAL:  pedal pulses nonpalpable  GI: abdomen soft, normal BS, no masses, no tenderness, no hepatomegaly, no splenomegaly PSYCHIATRIC: the patient is alert & oriented to person, affect & behavior appropriate  LABS/RADIOLOGY:  9-14 BUN 43, creatinine 1.76 otherwise BMP normal, HDL 39 otherwise Fasting lipid panel normal, uric acid 15.1  7-14 hemoglobin 9.8, MCV 92 otherwise CBC normal, albumin 3.2 otherwise liver profile normal  6/14 glc 108, bun 33, cr 1.46 ow bmp nl, TSH 1.17 01/2013:  Fasting lipid panel normal.  12/2012:  Hemoglobin 9.5, MCV 90, otherwise CBC normal.  Glucose 134, creatinine 1.48, albumin 2.9, otherwise CMP normal.   ASSESSMENT/PLAN:   Diabetes mellitus.  Uncontrolled.  Lantus was increased.  Hyperlipidemia.  Well controlled.   Anemia of chronic kidney disease.  Stable.  Renovascular hypertension. BP unstable. Start Norvasc 5 mg daily  CHF.  Adequately compensated.   Chronic kidney disease stage III.  Renal fcns increased. Patient is on Lasix. Recheck  Coronary artery disease.   Stable.   Gout-on allopurinol.   CPT CODE: 40981.

## 2013-11-30 ENCOUNTER — Non-Acute Institutional Stay (SKILLED_NURSING_FACILITY): Payer: Medicare Other | Admitting: Internal Medicine

## 2013-11-30 DIAGNOSIS — I509 Heart failure, unspecified: Secondary | ICD-10-CM

## 2013-11-30 DIAGNOSIS — E1129 Type 2 diabetes mellitus with other diabetic kidney complication: Secondary | ICD-10-CM

## 2013-11-30 DIAGNOSIS — I5032 Chronic diastolic (congestive) heart failure: Secondary | ICD-10-CM

## 2013-11-30 DIAGNOSIS — I150 Renovascular hypertension: Secondary | ICD-10-CM

## 2013-12-02 ENCOUNTER — Encounter: Payer: Self-pay | Admitting: Internal Medicine

## 2013-12-02 NOTE — Progress Notes (Signed)
Patient ID: [name], female   DOB: [birth year], 77 y.o.   MRN: [id number]        PROGRESS NOTE  DATE:   11/30/2013    FACILITY:  [facility]   LEVEL OF CARE: SNF  Routine Visit  CHIEF COMPLAINT:  Manage chronic kidney disease stage III, CHF, hypertension and diabetes mellitus.   HISTORY OF PRESENT ILLNESS:  REASSESSMENT OF ONGOING PROBLEM(S):  CHRONIC KIDNEY DISEASE: The patient's chronic kidney disease is unstable.  Patient denies increasing lower extremity swelling or confusion. Last BUN and creatinine are: 43-1.76 on 08-30-13. In  6-14 BUN 33/creatinine 1.46, in 9-14 BUN 43, creatinine 1.76.  CHF: Patient complains of  chronic lower extremity swelling.  The patient does not relate significant weight changes, denies sob, DOE, orthopnea, PNDs, palpitations or chest pain.  CHF remains stable.  No complications form the medications being used.   HTN: Patient complains of  chronic lower extremity swelling.  Pt 's HTN remains stable.  Denies CP, sob, DOE, pedal edema, headaches, dizziness or visual disturbances.  No complications from the medications currently being used.  Last BP :142/80, 140/80, 150/90, 144/62, 144/80, 136/80, 140/80, 130/90.  DM:pt's DM remains stable.  Pt denies polyuria, polydipsia, polyphagia, changes in vision or hypoglycemic episodes.  No complications noted from the medication presently being used.  Last hemoglobin A1c is:  6.7 in 12/2012, in 7/14 hemoglobin A1c 6.6, in 10-14 HbA1c 7.5  PAST MEDICAL HISTORY : Reviewed.  No changes.  CURRENT MEDICATIONS: Reviewed per [facility]  REVIEW OF SYSTEMS:  GENERAL: no change in appetite, no fatigue, no weight changes, no fever, chills or weakness RESPIRATORY: no cough, SOB, DOE, wheezing, hemoptysis CARDIAC: no chest pain, palpitations, complains of  chronic lower extremity swelling GI: no abdominal pain, diarrhea, constipation, heart burn, nausea or vomiting  PHYSICAL EXAMINATION  VS:  T 97.7    P 68    RR 20      BP 130/90     POX %     WT (Lb) 247  GENERAL: no acute distress, morbidly obese body habitus NECK: supple, trachea midline, no neck masses, no thyroid tenderness, no thyromegaly RESPIRATORY: breathing is even & unlabored, BS CTAB CARDIAC: RRR, no murmur,no extra heart sounds EDEMA/VARICOSITIES:  +2 bilateral lower extremity edema  ARTERIAL:  pedal pulses nonpalpable  GI: abdomen soft, normal BS, no masses, no tenderness, no hepatomegaly, no splenomegaly PSYCHIATRIC: the patient is alert & oriented to person, affect & behavior appropriate  LABS/RADIOLOGY:  9-14 BUN 43, creatinine 1.76 otherwise BMP normal, HDL 39 otherwise Fasting lipid panel normal, uric acid 15.1  7-14 hemoglobin 9.8, MCV 92 otherwise CBC normal, albumin 3.2 otherwise liver profile normal  6/14 glc 108, bun 33, cr 1.46 ow bmp nl, TSH 1.17 01/2013:  Fasting lipid panel normal.  12/2012:  Hemoglobin 9.5, MCV 90, otherwise CBC normal.  Glucose 134, creatinine 1.48, albumin 2.9, otherwise CMP normal.   ASSESSMENT/PLAN:   Diabetes mellitus.  Uncontrolled.  Lantus was increased.  Hyperlipidemia.  Well controlled.   Anemia of chronic kidney disease.  Stable.  Renovascular hypertension. BP improved.  CHF.  Adequately compensated.   Chronic kidney disease stage III.  Renal fcns increased. Patient is on Lasix. Recheck  Coronary artery disease.   Stable.   Gout-on allopurinol.   CPT CODE: 30865.

## 2013-12-07 ENCOUNTER — Non-Acute Institutional Stay (SKILLED_NURSING_FACILITY): Payer: Medicare Other | Admitting: Internal Medicine

## 2013-12-09 ENCOUNTER — Encounter: Payer: Self-pay | Admitting: Internal Medicine

## 2013-12-09 NOTE — Progress Notes (Signed)
PROGRESS NOTE  DATE: 12/07/2013  FACILITY:  [facility]  LEVEL OF CARE: SNF (31)  Acute Visit  CHIEF COMPLAINT:  Manage renal insufficiency  HISTORY OF PRESENT ILLNESS: I was requested by the staff to assess the patient regarding above problem(s):  CHRONIC KIDNEY DISEASE: The patient's chronic kidney disease remains stable.  Patient denies increasing lower extremity swelling or confusion. Last BUN and creatinine are: 36, 1.53 on 12-04-13.  In 9-14 BUN 43, creatinine 1.76.  PAST MEDICAL HISTORY : Reviewed.  No changes.  CURRENT MEDICATIONS: Reviewed per [facility]  PHYSICAL EXAMINATION  GENERAL: no acute distress, morbidly obese body habitus RESPIRATORY: breathing is even & unlabored, BS CTAB CARDIAC: RRR, no murmur,no extra heart sounds, +3 bilateral lower extremity edema  LABS/RADIOLOGY: See history of present illness  ASSESSMENT/PLAN:  Chronic kidney disease-renal functions improved  CPT CODE: 04540

## 2014-01-01 ENCOUNTER — Encounter: Payer: Self-pay | Admitting: Family

## 2014-01-01 ENCOUNTER — Non-Acute Institutional Stay (SKILLED_NURSING_FACILITY): Payer: Medicare Other | Admitting: Family

## 2014-01-01 DIAGNOSIS — E1165 Type 2 diabetes mellitus with hyperglycemia: Secondary | ICD-10-CM

## 2014-01-01 DIAGNOSIS — F411 Generalized anxiety disorder: Secondary | ICD-10-CM

## 2014-01-01 DIAGNOSIS — E1129 Type 2 diabetes mellitus with other diabetic kidney complication: Secondary | ICD-10-CM

## 2014-01-01 DIAGNOSIS — R609 Edema, unspecified: Secondary | ICD-10-CM

## 2014-01-01 DIAGNOSIS — I10 Essential (primary) hypertension: Secondary | ICD-10-CM

## 2014-01-01 DIAGNOSIS — K5900 Constipation, unspecified: Secondary | ICD-10-CM

## 2014-01-01 NOTE — Progress Notes (Signed)
Patient ID: [name], female   DOB: 11/12/1936, 78 y.o.   MRN: [id number]  Date: 01/01/14 Facility: [facility]  Code Status:  Full  Chief Complaint  Patient presents with  . Medical Managment of Chronic Issues    routine visit    HPI: Pt is being followed for the medical management of chronic illnesses. Pt and health care team denies issues/concerns at present.       Allergies  Allergen Reactions  . Lisinopril     REACTION: cough     Medication List       This list is accurate as of: 01/01/14  1:02 PM.  Always use your most recent med list.               aspirin EC 81 MG tablet  Take 1 tablet (81 mg total) by mouth daily.     atorvastatin 20 MG tablet  Commonly known as:  LIPITOR  Take 1 tablet (20 mg total) by mouth daily.     brimonidine 0.2 % ophthalmic solution  Commonly known as:  ALPHAGAN  Place 1 drop into both eyes daily.     carvedilol 25 MG tablet  Commonly known as:  COREG  Take 1 tablet (25 mg total) by mouth 2 (two) times daily with a meal.     DSS 100 MG Caps  Take 100 mg by mouth 2 (two) times daily.     furosemide 20 MG tablet  Commonly known as:  LASIX  Take 20 mg by mouth 2 (two) times daily.     glucose blood test strip  1 each by Other route 2 (two) times daily.     hydrALAZINE 10 MG tablet  Commonly known as:  APRESOLINE  Take 50 mg by mouth 3 (three) times daily.     insulin glargine 100 UNIT/ML injection  Commonly known as:  LANTUS  Inject 15 Units into the skin daily.     LORazepam 0.5 MG tablet  Commonly known as:  ATIVAN  0.25 mg. Take 1/2 tablet by mouth every 6 hours as needed for anxiety or exercise.     QUEtiapine 25 MG tablet  Commonly known as:  SEROQUEL  Take 25 mg by mouth 2 (two) times daily.         DATA REVIEWED    Laboratory Studies: 12/04/13: BUN 36, Creatinine 1.53, 09/28/13-Hemoglobin A1C 7.5      Past Medical History  Diagnosis Date  . Coronary artery disease     LHC  05/25/06: No renal artery stenosis, proximal LAD 40%, proximal D1 40%  . Diabetes mellitus     a1c 7.3 in 10/2011  . Hypertension   . Systolic heart failure     Echo 10/09: EF 30-35%. Echo 2/13: EF 50-55%.  Marland Kitchen NICM (nonischemic cardiomyopathy)   . Blood transfusion without reported diagnosis   . Anxiety   . Arthritis   . Chronic diastolic CHF (congestive heart failure)   . GLAUCOMA 12/06/2008    Vision Loss right eye   . 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    . Visual hallucinations 07/15/2011  . Stage III chronic kidney disease 01/10/2007  . OSA (obstructive sleep apnea) 03/04/2012  . HYPERLIPIDEMIA 12/27/2006  . GERD (gastroesophageal reflux disease)     Past Surgical History  Procedure Laterality Date  . Coronary angioplasty with stent placement    . Eye surgery  2012    both eyes  Review of Systems  Constitutional: Negative.   Eyes:       Blind  Respiratory: Negative.   Cardiovascular: Positive for chest pain.       CP with exertion  Gastrointestinal: Positive for abdominal pain.  Genitourinary:       Incontinent Urine  Musculoskeletal: Positive for joint pain.       Joint Stiffness  Skin: Negative.   Neurological: Negative.   Endo/Heme/Allergies:       DM type 2  Psychiatric/Behavioral: Negative.      Physical Exam Filed Vitals:   01/01/14 1247  BP: 110/80  Pulse: 80  Temp: 97.1 F (36.2 C)  Resp: 18   There is no weight on file to calculate BMI. Physical Exam  Constitutional: She is oriented to person, place, and time.  Pt in supine position in facility's bed  Neck: Thyromegaly present.  Cardiovascular: Normal rate and regular rhythm.   2+ bialteral UE/LE edema  Pulmonary/Chest: Effort normal and breath sounds normal.  Abdominal: Soft. Bowel sounds are normal.  Neurological: She is alert and oriented to person, place, and time.  Skin: Skin is warm and dry.  Feet dry with calluses noted to  bilateral heels  Psychiatric: She has a normal mood and affect. Her speech is normal and behavior is normal. Cognition and memory are normal.    ASSESSMENT/PLAN Hypertension-pt is on Apresoline/Norvasc/ and Coreg; will obtain a BMP to assess if it's appropriate to restart pt on an ARB-ARB was d/c when pt was in ARF 1/14 DM-will continue Lantus 15 units, obtain Hemoglobin A1c and Lipid Panel-reinforce proper nutrition with consult Anxiety-pt is stable on Ativan 0.25 mg prn Depression-pt is stable on Seroquel; will continue Unspecified constipation-will continue with colace Edema-will continue Lasix; pt is stable  Refer to cardiology for intermittent chest pain  Asa prescribed for CAD prophylaxis, will obtain CBC with diff/CMP/Vitamin D/Vitamin B12, TSH  Follow up:prn  Time spent: 1.15 coordination of care, review of charts, collaboration with pharmacist, and face to face encounter

## 2014-01-22 ENCOUNTER — Encounter: Payer: Self-pay | Admitting: *Deleted

## 2014-01-23 ENCOUNTER — Non-Acute Institutional Stay (SKILLED_NURSING_FACILITY): Payer: Medicare Other | Admitting: Internal Medicine

## 2014-01-23 DIAGNOSIS — N039 Chronic nephritic syndrome with unspecified morphologic changes: Secondary | ICD-10-CM

## 2014-01-23 DIAGNOSIS — I150 Renovascular hypertension: Secondary | ICD-10-CM

## 2014-01-23 DIAGNOSIS — E1129 Type 2 diabetes mellitus with other diabetic kidney complication: Secondary | ICD-10-CM

## 2014-01-23 DIAGNOSIS — E785 Hyperlipidemia, unspecified: Secondary | ICD-10-CM

## 2014-01-23 DIAGNOSIS — D631 Anemia in chronic kidney disease: Secondary | ICD-10-CM

## 2014-01-24 ENCOUNTER — Ambulatory Visit (HOSPITAL_COMMUNITY)
Admission: RE | Admit: 2014-01-24 | Discharge: 2014-01-24 | Disposition: A | Discharge status: Home / Self Care | Payer: Medicare Other | Source: Ambulatory Visit | Attending: Internal Medicine | Admitting: Internal Medicine

## 2014-01-24 VITALS — BP 146/66 | HR 75

## 2014-01-24 DIAGNOSIS — G4733 Obstructive sleep apnea (adult) (pediatric): Secondary | ICD-10-CM | POA: Insufficient documentation

## 2014-01-24 DIAGNOSIS — N183 Chronic kidney disease, stage 3 unspecified: Secondary | ICD-10-CM | POA: Insufficient documentation

## 2014-01-24 DIAGNOSIS — I129 Hypertensive chronic kidney disease with stage 1 through stage 4 chronic kidney disease, or unspecified chronic kidney disease: Secondary | ICD-10-CM | POA: Insufficient documentation

## 2014-01-24 DIAGNOSIS — Z79899 Other long term (current) drug therapy: Secondary | ICD-10-CM | POA: Insufficient documentation

## 2014-01-24 DIAGNOSIS — K219 Gastro-esophageal reflux disease without esophagitis: Secondary | ICD-10-CM | POA: Insufficient documentation

## 2014-01-24 DIAGNOSIS — F411 Generalized anxiety disorder: Secondary | ICD-10-CM | POA: Insufficient documentation

## 2014-01-24 DIAGNOSIS — E1139 Type 2 diabetes mellitus with other diabetic ophthalmic complication: Secondary | ICD-10-CM | POA: Insufficient documentation

## 2014-01-24 DIAGNOSIS — I5032 Chronic diastolic (congestive) heart failure: Secondary | ICD-10-CM | POA: Insufficient documentation

## 2014-01-24 DIAGNOSIS — E11359 Type 2 diabetes mellitus with proliferative diabetic retinopathy without macular edema: Secondary | ICD-10-CM | POA: Insufficient documentation

## 2014-01-24 DIAGNOSIS — I509 Heart failure, unspecified: Secondary | ICD-10-CM | POA: Insufficient documentation

## 2014-01-24 DIAGNOSIS — E785 Hyperlipidemia, unspecified: Secondary | ICD-10-CM | POA: Insufficient documentation

## 2014-01-24 DIAGNOSIS — R079 Chest pain, unspecified: Secondary | ICD-10-CM | POA: Insufficient documentation

## 2014-01-24 DIAGNOSIS — Z7982 Long term (current) use of aspirin: Secondary | ICD-10-CM | POA: Insufficient documentation

## 2014-01-24 DIAGNOSIS — I2510 Atherosclerotic heart disease of native coronary artery without angina pectoris: Secondary | ICD-10-CM | POA: Insufficient documentation

## 2014-01-24 NOTE — Progress Notes (Signed)
Patient ID: [name], female   DOB: [birth year], 78 y.o.   MRN: [id number] Referring Physician: Dr. Eden EmmsNishan Primary Care: Dr. Dorise HissKollar Primary Cardiologist: Dr. Eden EmmsNishan   Weight Range: 255-259 Baseline proBNP: 144  HPI: [name] Clicheearlie H Reimann is a 78 y.o. African American female with history of NICM, EF 30-35% 2009 with repeat EF 50-55% 01/2012, minimal nonobstructive coronary artery disease, HTN, hyperlipidemia, diabetes, diabetic retinopathy with residual blindness in left eye and loss of most sight in right.    LHC 05/25/06: No renal artery stenosis, proximal LAD 40%, proximal D1 40%  Admitted for acute on chronic diastolic dysfunction 2013.  Diuresed with IV lasix. Labs 01/19/12: Potassium 4.5, BUN 22, creatinine 1.30, BNP 860, hemoglobin 12.9. Chest x-ray at that time was negative for edema.   Last echo 2/13: EF  50% to 55%.  She returns for follow up. [facility]. No weights available .  Unable to stand for weights. Denies SOB/PND/Orthopnea. Last week she had chest discomfort for a few minutes but it was after she had food.     Past Medical History  Diagnosis Date  . Coronary artery disease     LHC 05/25/06: No renal artery stenosis, proximal LAD 40%, proximal D1 40%  . Diabetes mellitus     a1c 7.3 in 10/2011  . Hypertension   . Systolic heart failure     Echo 10/09: EF 30-35%. Echo 2/13: EF 50-55%.  Marland Kitchen. NICM (nonischemic cardiomyopathy)   . Blood transfusion without reported diagnosis   . Anxiety   . Arthritis   . Chronic diastolic CHF (congestive heart failure)   . GLAUCOMA 12/06/2008    Vision Loss right eye   . 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    . Visual hallucinations 07/15/2011  . Stage III chronic kidney disease 01/10/2007  . OSA (obstructive sleep apnea) 03/04/2012  . HYPERLIPIDEMIA 12/27/2006  . GERD (gastroesophageal reflux disease)     Current Outpatient Prescriptions  Medication  Sig Dispense Refill  . allopurinol (ZYLOPRIM) 100 MG tablet Take 200 mg by mouth daily.      Marland Kitchen. amLODipine (NORVASC) 5 MG tablet Take 5 mg by mouth daily.      Marland Kitchen. aspirin EC 81 MG tablet Take 1 tablet (81 mg total) by mouth daily.  30 tablet  11  . atorvastatin (LIPITOR) 20 MG tablet Take 1 tablet (20 mg total) by mouth daily.  30 tablet  1  . brimonidine (ALPHAGAN) 0.2 % ophthalmic solution Place 1 drop into both eyes daily.      . carvedilol (COREG) 25 MG tablet Take 1 tablet (25 mg total) by mouth 2 (two) times daily with a meal.  60 tablet  11  . docusate sodium 100 MG CAPS Take 100 mg by mouth 2 (two) times daily.  10 capsule  0  . furosemide (LASIX) 20 MG tablet Take 20 mg by mouth 2 (two) times daily.      Marland Kitchen. glucose blood test strip 1 each by Other route 2 (two) times daily.      . hydrALAZINE (APRESOLINE) 50 MG tablet Take 50 mg by mouth 3 (three) times daily.      . insulin glargine (LANTUS) 100 UNIT/ML injection Inject 15 Units into the skin daily.      Marland Kitchen. LORazepam (ATIVAN) 0.5 MG tablet 0.25 mg. Take 1/2 tablet by mouth every 6 hours as needed for anxiety or exercise.      .Marland Kitchen  QUEtiapine (SEROQUEL) 25 MG tablet Take 25 mg by mouth 2 (two) times daily.      . sennosides-docusate sodium (SENOKOT-S) 8.6-50 MG tablet Take 2 tablets by mouth daily.       No current facility-administered medications for this encounter.    Allergies  Allergen Reactions  . Lisinopril     REACTION: cough    History   Social History  . Marital Status: Widowed    Spouse Name: N/A    Number of Children: 5  . Years of Education: RN school   Occupational History  . Retired     formerly worked as a Chief Strategy Officer   Social History Main Topics  . Smoking status: Never Smoker   . Smokeless tobacco: Never Used     Comment: does have second hand smoke exposure  . Alcohol Use: No  . Drug Use: No  . Sexual Activity: No   Other Topics Concern  . Not on file   Social History Narrative   Lives with  son who helps with her medications.    Family History  Problem Relation Age of Onset  . Heart disease Maternal Grandmother   . Prostate cancer Father   . Colon cancer Maternal Aunt     PHYSICAL EXAM: Filed Vitals:   01/24/14 1236  BP: 146/66  Pulse: 75  SpO2: 98%    General:  Obese, sitting in WC.  Legally blind.  No respiratory difficulty HEENT: normal except for blindness.  Neck: supple. no JVD. Carotids 2+ bilat; no bruits. No lymphadenopathy or thryomegaly appreciated. Cor: PMI nondisplaced. Distant regular rate & rhythm. Soft TR. Lungs: clear Abdomen: soft, nontender, nondistended. No hepatosplenomegaly. No bruits or masses. Good bowel sounds. Extremities: no cyanosis, clubbing, rash, R and LLE trace edema Neuro: alert & oriented x 3, cranial nerves grossly intact. moves all 4 extremities w/o difficulty. Affect pleasant.  ECG: SR 74 with non-specific ST depression laterally. No change from previous  ASSESSMENT & PLAN:  1. Chronic Diastolic Heart Failure - Volume status stable. Continue lasix 20 mg twice a day. Instructed to take an additional 20 mg lasix if her weight is up 3 pounds in 24 hours.   2. Chest Pain - check EKG. Doubt ischemic. Repeat ECHO   Follow up in 6 months   CLEGG,AMY NP-C 4:48 PM   Patient seen and examined with Tonye Becket, NP. We discussed all aspects of the encounter. I agree with the assessment and plan as stated above.   She is doing fairly well. Volume status looks good. Reinforced need for daily weights and reviewed use of sliding scale diuretics. CP seems non-ischemic ECG ok. Will get echo. Can f/u with Dr. [name] for further   Truman Hayward 12:04 PM

## 2014-01-24 NOTE — Patient Instructions (Signed)
Follow up in 6 months 

## 2014-02-01 NOTE — Progress Notes (Signed)
Patient ID: [name], female   DOB: July 28, 1936, 78 y.o.   MRN: [id number]         PROGRESS NOTE  DATE:   01/23/2014    FACILITY:  [facility]   LEVEL OF CARE: SNF  Routine Visit  CHIEF COMPLAINT:  Manage chronic kidney disease stage III, CHF, hypertension and diabetes mellitus.   HISTORY OF PRESENT ILLNESS:  REASSESSMENT OF ONGOING PROBLEM(S):  CHRONIC KIDNEY DISEASE: The patient's chronic kidney disease is unstable.  Patient denies increasing lower extremity swelling or confusion. Last BUN and creatinine are: 43-1.76 on 08-30-13. In  6-14 BUN 33/creatinine 1.46, in 9-14 BUN 43, creatinine 1.76, in 1-15 BUN 33, creatinine 1.6  CHF: Patient complains of  chronic lower extremity swelling.  The patient does not relate significant weight changes, denies sob, DOE, orthopnea, PNDs, palpitations or chest pain.  CHF remains stable.  No complications form the medications being used.   HTN: Patient complains of  chronic lower extremity swelling.  Pt 's HTN remains stable.  Denies CP, sob, DOE, pedal edema, headaches, dizziness or visual disturbances.  No complications from the medications currently being used.  Last BP :142/80, 140/80, 150/90, 144/62, 144/80, 136/80, 140/80, 130/90, 140/78.  DM:pt's DM remains stable.  Pt denies polyuria, polydipsia, polyphagia, changes in vision or hypoglycemic episodes.  No complications noted from the medication presently being used.  Last hemoglobin A1c is:  6.7 in 12/2012, in 7/14 hemoglobin A1c 6.6, in 10-14 HbA1c 7.5, in 1-15 hemoglobin A1c 6.6  PAST MEDICAL HISTORY : Reviewed.  No changes.  CURRENT MEDICATIONS: Reviewed per [facility]  REVIEW OF SYSTEMS:  GENERAL: no change in appetite, no fatigue, no weight changes, no fever, chills or weakness RESPIRATORY: no cough, SOB, DOE, wheezing, hemoptysis CARDIAC: no chest pain, palpitations, complains of  chronic lower extremity swelling GI: no abdominal pain, diarrhea, constipation, heart burn, nausea or  vomiting  PHYSICAL EXAMINATION  VS:  T 98.4    P 80    RR 22     BP 140/78      WT (Lb) 250  GENERAL: no acute distress, morbidly obese body habitus EYES: Normal sclerae, normal conjunctivae, discharge is NECK: supple, trachea midline, no neck masses, no thyroid tenderness, no thyromegaly LYMPHATICS: No cervical lymphadenopathy, no supraclavicular lymphadenopathy is RESPIRATORY: breathing is even & unlabored, BS CTAB CARDIAC: RRR, no murmur,no extra heart sounds EDEMA/VARICOSITIES:  +2 bilateral lower extremity edema  ARTERIAL:  pedal pulses nonpalpable  GI: abdomen soft, normal BS, no masses, no tenderness, no hepatomegaly, no splenomegaly PSYCHIATRIC: the patient is alert & oriented to person, affect & behavior appropriate  LABS/RADIOLOGY: 1-15 hemoglobin 10, MCV 93 otherwise CBC normal, BUN 33, creatinine 1.6, total protein 5.8, albumin 3.3 otherwise CMP normal, fasting lipid panel normal, TSH 2.876, vitamin D level 10.7 9-14 BUN 43, creatinine 1.76 otherwise BMP normal, HDL 39 otherwise Fasting lipid panel normal, uric acid 15.1  7-14 hemoglobin 9.8, MCV 92 otherwise CBC normal, albumin 3.2 otherwise liver profile normal  6/14 glc 108, bun 33, cr 1.46 ow bmp nl, TSH 1.17 01/2013:  Fasting lipid panel normal.  12/2012:  Hemoglobin 9.5, MCV 90, otherwise CBC normal.  Glucose 134, creatinine 1.48, albumin 2.9, otherwise CMP normal.   ASSESSMENT/PLAN:   Diabetes mellitus.  Well controlled.  Hyperlipidemia.  Well controlled.   Anemia of chronic kidney disease.  Stable.  Renovascular hypertension. BP borderline. Will monitor.  CHF.  Adequately compensated.   Chronic kidney disease stage III.  stable.  Coronary artery  disease.   Stable.   Gout-on allopurinol.  Vitamin D deficiency-started on vitamin D 2 50,000 units q. monthly.   CPT CODE: [id number]

## 2014-02-07 ENCOUNTER — Ambulatory Visit (HOSPITAL_COMMUNITY)
Admission: RE | Admit: 2014-02-07 | Discharge: 2014-02-07 | Disposition: A | Discharge status: Home / Self Care | Payer: Medicare Other | Source: Ambulatory Visit | Attending: Cardiology | Admitting: Cardiology

## 2014-02-07 DIAGNOSIS — E669 Obesity, unspecified: Secondary | ICD-10-CM | POA: Insufficient documentation

## 2014-02-07 DIAGNOSIS — G4733 Obstructive sleep apnea (adult) (pediatric): Secondary | ICD-10-CM | POA: Insufficient documentation

## 2014-02-07 DIAGNOSIS — I5032 Chronic diastolic (congestive) heart failure: Secondary | ICD-10-CM

## 2014-02-07 DIAGNOSIS — E119 Type 2 diabetes mellitus without complications: Secondary | ICD-10-CM | POA: Insufficient documentation

## 2014-02-07 DIAGNOSIS — N189 Chronic kidney disease, unspecified: Secondary | ICD-10-CM | POA: Insufficient documentation

## 2014-02-07 DIAGNOSIS — I129 Hypertensive chronic kidney disease with stage 1 through stage 4 chronic kidney disease, or unspecified chronic kidney disease: Secondary | ICD-10-CM | POA: Insufficient documentation

## 2014-02-07 DIAGNOSIS — I517 Cardiomegaly: Secondary | ICD-10-CM

## 2014-02-07 DIAGNOSIS — I509 Heart failure, unspecified: Secondary | ICD-10-CM | POA: Insufficient documentation

## 2014-02-07 DIAGNOSIS — E785 Hyperlipidemia, unspecified: Secondary | ICD-10-CM | POA: Insufficient documentation

## 2014-02-07 DIAGNOSIS — I2510 Atherosclerotic heart disease of native coronary artery without angina pectoris: Secondary | ICD-10-CM | POA: Insufficient documentation

## 2014-02-07 NOTE — Progress Notes (Signed)
  Echocardiogram 2D Echocardiogram has been performed.  Cathie Beams 02/07/2014, 12:04 PM

## 2014-04-23 ENCOUNTER — Non-Acute Institutional Stay (SKILLED_NURSING_FACILITY): Payer: Medicare Other | Admitting: Internal Medicine

## 2014-04-23 DIAGNOSIS — N183 Chronic kidney disease, stage 3 unspecified: Secondary | ICD-10-CM

## 2014-04-23 DIAGNOSIS — E1129 Type 2 diabetes mellitus with other diabetic kidney complication: Secondary | ICD-10-CM

## 2014-04-23 DIAGNOSIS — I150 Renovascular hypertension: Secondary | ICD-10-CM

## 2014-04-23 DIAGNOSIS — I509 Heart failure, unspecified: Secondary | ICD-10-CM

## 2014-04-23 DIAGNOSIS — I5032 Chronic diastolic (congestive) heart failure: Secondary | ICD-10-CM

## 2014-04-23 NOTE — Progress Notes (Signed)
Patient ID: [name], female   DOB: January 14, 1936, 78 y.o.   MRN: [id number]          PROGRESS NOTE  DATE:   04/23/2014    FACILITY:  [facility]   LEVEL OF CARE: SNF  Routine Visit  CHIEF COMPLAINT:  Manage chronic kidney disease stage III, CHF, hypertension and diabetes mellitus.   HISTORY OF PRESENT ILLNESS:  REASSESSMENT OF ONGOING PROBLEM(S):  CHRONIC KIDNEY DISEASE: The patient's chronic kidney disease is unstable.  Patient denies increasing lower extremity swelling or confusion. Last BUN and creatinine are: 43-1.76 on 08-30-13. In  6-14 BUN 33/creatinine 1.46, in 9-14 BUN 43, creatinine 1.76, in 1-15 BUN 33, creatinine 1.6  CHF: Patient complains of  chronic lower extremity swelling.  The patient does not relate significant weight changes, denies sob, DOE, orthopnea, PNDs, palpitations or chest pain.  CHF remains stable.  No complications form the medications being used.   HTN: Patient complains of  chronic lower extremity swelling.  Pt 's HTN remains stable.  Denies CP, sob, DOE, pedal edema, headaches, dizziness or visual disturbances.  No complications from the medications currently being used.  Last BP :142/80, 140/80, 150/90, 144/62, 144/80, 136/80, 140/80, 130/90, 140/78, 144/72.  DM:pt's DM remains stable.  Pt denies polyuria, polydipsia, polyphagia, changes in vision or hypoglycemic episodes.  No complications noted from the medication presently being used.  Last hemoglobin A1c is:  6.7 in 12/2012, in 7/14 hemoglobin A1c 6.6, in 10-14 HbA1c 7.5, in 1-15 hemoglobin A1c 6.6, 4-15 hemoglobin A1c 6.6  PAST MEDICAL HISTORY : Reviewed.  No changes.  CURRENT MEDICATIONS: Reviewed per [facility]  REVIEW OF SYSTEMS:  GENERAL: no change in appetite, no fatigue, no weight changes, no fever, chills or weakness RESPIRATORY: no cough, SOB, DOE, wheezing, hemoptysis CARDIAC: no chest pain, palpitations, complains of  chronic lower extremity swelling GI: no abdominal pain, diarrhea,  constipation, heart burn, nausea or vomiting  PHYSICAL EXAMINATION  VS: See vital signs section  GENERAL: no acute distress, morbidly obese body habitus EYES: Normal sclerae, normal conjunctivae, discharge is NECK: supple, trachea midline, no neck masses, no thyroid tenderness, no thyromegaly LYMPHATICS: No cervical lymphadenopathy, no supraclavicular lymphadenopathy is RESPIRATORY: breathing is even & unlabored, BS CTAB CARDIAC: RRR, no murmur,no extra heart sounds EDEMA/VARICOSITIES:  +2 bilateral lower extremity edema  ARTERIAL:  pedal pulses nonpalpable  GI: abdomen soft, normal BS, no masses, no tenderness, no hepatomegaly, no splenomegaly PSYCHIATRIC: the patient is alert & oriented to person, affect & behavior appropriate  LABS/RADIOLOGY: 3-15 Fasting lipid panel normal 1-15 hemoglobin 10, MCV 93 otherwise CBC normal, BUN 33, creatinine 1.6, total protein 5.8, albumin 3.3 otherwise CMP normal, fasting lipid panel normal, TSH 2.876, vitamin D level 10.7 9-14 BUN 43, creatinine 1.76 otherwise BMP normal, HDL 39 otherwise Fasting lipid panel normal, uric acid 15.1  7-14 hemoglobin 9.8, MCV 92 otherwise CBC normal, albumin 3.2 otherwise liver profile normal  6/14 glc 108, bun 33, cr 1.46 ow bmp nl, TSH 1.17 01/2013:  Fasting lipid panel normal.  12/2012:  Hemoglobin 9.5, MCV 90, otherwise CBC normal.  Glucose 134, creatinine 1.48, albumin 2.9, otherwise CMP normal.   ASSESSMENT/PLAN:   Diabetes mellitus.  Well controlled.  Hyperlipidemia.  Well controlled.   Anemia of chronic kidney disease.  Stable.  Renovascular hypertension. BP borderline. Will monitor.  CHF.  Adequately compensated.   Chronic kidney disease stage III.  stable.  Coronary artery disease.   Stable.   Gout-on allopurinol.  Vitamin D deficiency-  on vitamin D 2 50,000 units q. monthly.   CPT CODE: 1914799309  Newton PiggGayani Y. Kerry Doryasanayaka, MD [facility] [phone]

## 2014-04-30 ENCOUNTER — Non-Acute Institutional Stay (SKILLED_NURSING_FACILITY): Payer: Medicare Other | Admitting: Internal Medicine

## 2014-04-30 DIAGNOSIS — N189 Chronic kidney disease, unspecified: Secondary | ICD-10-CM

## 2014-04-30 DIAGNOSIS — D631 Anemia in chronic kidney disease: Secondary | ICD-10-CM

## 2014-04-30 DIAGNOSIS — N390 Urinary tract infection, site not specified: Secondary | ICD-10-CM

## 2014-04-30 DIAGNOSIS — N179 Acute kidney failure, unspecified: Secondary | ICD-10-CM

## 2014-04-30 DIAGNOSIS — N039 Chronic nephritic syndrome with unspecified morphologic changes: Secondary | ICD-10-CM

## 2014-05-01 NOTE — Progress Notes (Signed)
Patient ID: [name], female   DOB: [birth year], 78 y.o.   MRN: [id number]            PROGRESS NOTE  DATE: 04/30/2014       FACILITY:  [facility]  LEVEL OF CARE: SNF (31)  Acute Visit  CHIEF COMPLAINT:  Manage UTI, acute renal failure, and anemia of chronic kidney disease.    HISTORY OF PRESENT ILLNESS: I was requested by the staff to assess the patient regarding above problem(s):  UTI:  On 04/25/2014:  Patient's urinalysis shows cloudy appearance, 3+ WBC esterase, positive nitrite, WBCs greater than 30, RBCs 0-2.  Urine culture is growing E.coli and Klebsiella pneumoniae significantly.  Patient denies suprapubic pain, dysuria, or flank pain.    ACUTE RENAL FAILURE:  New problem.  On 04/25/2014:  BUN 37, creatinine 1.87.  In 10/2013:  BUN 33, creatinine 1.6.  The patient is on Lasix.  She has a history of chronic kidney disease.  She denies increase in lower extremity swelling or confusion.    ANEMIA: The anemia has been stable. The patient denies fatigue, melena or hematochezia. No complications from the medications currently being used.   On 04/25/2014:  Hemoglobin 10.4, MCV 95.  In 10/2013:  Hemoglobin 10.  The patient's anemia is secondary to chronic kidney disease.    PAST MEDICAL HISTORY : Reviewed.  No changes/see problem list  CURRENT MEDICATIONS: Reviewed per MAR/see medication list  REVIEW OF SYSTEMS:  GENERAL: no change in appetite, no fatigue, no weight changes, no fever, chills or weakness RESPIRATORY: no cough, SOB, DOE,, wheezing, hemoptysis CARDIAC: no chest pain or palpitations; complains of  chronic lower extremity swelling    GI: no abdominal pain, diarrhea, constipation, heart burn, nausea or vomiting  PHYSICAL EXAMINATION  VS:  T 97.4       P 92      RR 16      BP 144/72     POX %       WT (Lb) 257  GENERAL: no acute distress, morbidly obese body habitus EYES: conjunctivae normal, sclerae normal, normal eye lids NECK: supple,  trachea midline, no neck masses, no thyroid tenderness, no thyromegaly LYMPHATICS: no LAN in the neck, no supraclavicular LAN RESPIRATORY: breathing is even & unlabored, BS CTAB CARDIAC: RRR, no murmur,no extra heart sounds, +2 bilateral lower extremity edema   GI: abdomen soft, normal BS, no masses, no tenderness, no hepatomegaly, no splenomegaly PSYCHIATRIC: the patient is alert & oriented to person, affect & behavior appropriate  ASSESSMENT/PLAN:  UTI.  New problem.  Start doxycycline 100 mg b.i.d. for one week and probiotics b.i.d. for 10 days.    Acute renal failure.  New onset.  Significant problem.  Recheck renal functions.  Continue Lasix for now.    Anemia of chronic kidney disease.  Hemoglobin improved.     CPT CODE: 43838       Angela Cox, MD [facility] [phone]

## 2014-06-06 ENCOUNTER — Non-Acute Institutional Stay (SKILLED_NURSING_FACILITY): Payer: Medicare Other | Admitting: Internal Medicine

## 2014-06-06 DIAGNOSIS — E1129 Type 2 diabetes mellitus with other diabetic kidney complication: Secondary | ICD-10-CM

## 2014-06-06 DIAGNOSIS — I150 Renovascular hypertension: Secondary | ICD-10-CM

## 2014-06-06 DIAGNOSIS — N183 Chronic kidney disease, stage 3 unspecified: Secondary | ICD-10-CM

## 2014-06-06 DIAGNOSIS — I5032 Chronic diastolic (congestive) heart failure: Secondary | ICD-10-CM

## 2014-06-06 DIAGNOSIS — I509 Heart failure, unspecified: Secondary | ICD-10-CM

## 2014-06-07 NOTE — Progress Notes (Signed)
Patient ID: [name], female   DOB: [birth year], 78 y.o.   MRN: [id number]         PROGRESS NOTE  DATE:   06/06/2014    FACILITY:  [facility]   LEVEL OF CARE: SNF  Routine Visit  CHIEF COMPLAINT:  Manage chronic kidney disease stage III, CHF, hypertension and diabetes mellitus.   HISTORY OF PRESENT ILLNESS:  REASSESSMENT OF ONGOING PROBLEM(S):  CHRONIC KIDNEY DISEASE: The patient's chronic kidney disease is unstable.  Patient denies increasing lower extremity swelling or confusion. Last BUN and creatinine are: 43-1.76 on 08-30-13. In  6-14 BUN 33/creatinine 1.46, in 9-14 BUN 43, creatinine 1.76, in 1-15 BUN 33, creatinine 1.6, in 5-15 BUN 37, creatinine 1.87.  CHF: Patient complains of  chronic lower extremity swelling.  The patient does not relate significant weight changes, denies sob, DOE, orthopnea, PNDs, palpitations or chest pain.  CHF remains stable.  No complications form the medications being used.   HTN: Patient complains of  chronic lower extremity swelling.  Pt 's HTN remains stable.  Denies CP, sob, DOE, pedal edema, headaches, dizziness or visual disturbances.  No complications from the medications currently being used.  Last BP :142/80, 140/80, 150/90, 144/62, 144/80, 136/80, 140/80, 130/90, 140/78, 144/72, 118/66.  DM:pt's DM remains stable.  Pt denies polyuria, polydipsia, polyphagia, changes in vision or hypoglycemic episodes.  No complications noted from the medication presently being used.  Last hemoglobin A1c is:  6.7 in 12/2012, in 7/14 hemoglobin A1c 6.6, in 10-14 HbA1c 7.5, in 1-15 hemoglobin A1c 6.6, 4-15 hemoglobin A1c 6.6  PAST MEDICAL HISTORY : Reviewed.  No changes.  CURRENT MEDICATIONS: Reviewed per [facility]  REVIEW OF SYSTEMS:  GENERAL: no change in appetite, no fatigue, no weight changes, no fever, chills or weakness RESPIRATORY: no cough, SOB, DOE, wheezing, hemoptysis CARDIAC: no chest pain, palpitations, complains of  chronic lower extremity  swelling GI: no abdominal pain, diarrhea, constipation, heart burn, nausea or vomiting  PHYSICAL EXAMINATION  VS: See vital signs section  GENERAL: no acute distress, morbidly obese body habitus NECK: supple, trachea midline, no neck masses, no thyroid tenderness, no thyromegaly RESPIRATORY: breathing is even & unlabored, BS CTAB CARDIAC: RRR, no murmur,no extra heart sounds EDEMA/VARICOSITIES:  +2 bilateral lower extremity edema  ARTERIAL:  pedal pulses nonpalpable  GI: abdomen soft, normal BS, no masses, no tenderness, no hepatomegaly, no splenomegaly PSYCHIATRIC: the patient is alert & oriented to person, affect & behavior appropriate  LABS/RADIOLOGY: 5-15 hemoglobin 10.4, MCV 95 otherwise CBC normal, BUN 37, creatinine 1.87, albumin 3.2 otherwise CMP normal 3-15 Fasting lipid panel normal 1-15 hemoglobin 10, MCV 93 otherwise CBC normal, BUN 33, creatinine 1.6, total protein 5.8, albumin 3.3 otherwise CMP normal, fasting lipid panel normal, TSH 2.876, vitamin D level 10.7 9-14 BUN 43, creatinine 1.76 otherwise BMP normal, HDL 39 otherwise Fasting lipid panel normal, uric acid 15.1  7-14 hemoglobin 9.8, MCV 92 otherwise CBC normal, albumin 3.2 otherwise liver profile normal  6/14 glc 108, bun 33, cr 1.46 ow bmp nl, TSH 1.17 01/2013:  Fasting lipid panel normal.  12/2012:  Hemoglobin 9.5, MCV 90, otherwise CBC normal.  Glucose 134, creatinine 1.48, albumin 2.9, otherwise CMP normal.   ASSESSMENT/PLAN:   Diabetes mellitus.  Well controlled.  Hyperlipidemia.  Well controlled.   Anemia of chronic kidney disease.  Hemoglobin improved.  Renovascular hypertension. Well controlled  CHF.  Adequately compensated.   Chronic kidney disease stage III.  renal functions increased. Recheck pending  Coronary artery disease.  Stable.   Gout-on allopurinol.  Vitamin D deficiency- on vitamin D 2 50,000 units q. monthly.   CPT CODE: [id number]  Newton PiggGayani Y. Kerry Doryasanayaka, MD [facility] [phone]

## 2014-06-08 ENCOUNTER — Non-Acute Institutional Stay (SKILLED_NURSING_FACILITY): Payer: Medicare Other | Admitting: Internal Medicine

## 2014-06-08 DIAGNOSIS — N183 Chronic kidney disease, stage 3 unspecified: Secondary | ICD-10-CM

## 2014-06-14 NOTE — Progress Notes (Signed)
Patient ID: AVLEEN ATWAL, female   DOB: [birth year], 78 y.o.   MRN: [id number]            PROGRESS NOTE  DATE: 06/08/2014         FACILITY:  [facility]  LEVEL OF CARE: SNF (31)  Acute Visit  CHIEF COMPLAINT:  Manage chronic kidney disease stage III.    HISTORY OF PRESENT ILLNESS: I was requested by the staff to assess the patient regarding above problem(s):  CHRONIC KIDNEY DISEASE: The patient's chronic kidney disease remains stable.  Patient denies increasing lower extremity swelling or confusion. Last BUN and creatinine are:   On 05/06/2014:  BUN 42, creatinine 1.81.  On 04/25/2014:  BUN 27, creatinine 1.87.    PAST MEDICAL HISTORY : Reviewed.  No changes/see problem list  CURRENT MEDICATIONS: Reviewed per MAR/see medication list  PHYSICAL EXAMINATION  VS:  T 98       P 73      RR 20      BP 126/54     POX 96%           GENERAL: no acute distress, morbidly obese body habitus RESPIRATORY: breathing is even & unlabored, BS CTAB CARDIAC: RRR, no murmur,no extra heart sounds, +2 bilateral lower extremity edema      ASSESSMENT/PLAN:  Chronic kidney disease stage III.  Renal functions improved.    CPT CODE: 25003         Angela Cox, MD [facility] [phone]

## 2014-06-15 ENCOUNTER — Non-Acute Institutional Stay (SKILLED_NURSING_FACILITY): Payer: Medicare Other | Admitting: Internal Medicine

## 2014-06-15 DIAGNOSIS — N390 Urinary tract infection, site not specified: Secondary | ICD-10-CM

## 2014-06-26 NOTE — Progress Notes (Signed)
Patient ID: [name], female   DOB: 1936-01-18, 78 y.o.   MRN: [id number]            PROGRESS NOTE  DATE: 06/15/2014        FACILITY:  [facility]  LEVEL OF CARE: SNF (31)  Acute Visit  CHIEF COMPLAINT:  Manage UTI.    HISTORY OF PRESENT ILLNESS: I was requested by the staff to assess the patient regarding above problem(s):  Due to confusion, the patient had urine studies done on 06/11/2014.  Urine culture grows Klebsiella pneumoniae significantly.  Patient denies suprapubic pain, flank pain, or dysuria.    PAST MEDICAL HISTORY : Reviewed.  No changes/see problem list  CURRENT MEDICATIONS: Reviewed per MAR/see medication list  REVIEW OF SYSTEMS:  GENERAL: no change in appetite, no fatigue, no weight changes, no fever, chills or weakness RESPIRATORY: no cough, SOB, DOE,, wheezing, hemoptysis CARDIAC: no chest pain, edema or palpitations GI: no abdominal pain, diarrhea, constipation, heart burn, nausea or vomiting  PHYSICAL EXAMINATION  VS:  T 98       P 73      RR 20      BP 126/54       WT (Lb) 253         GENERAL: no acute distress, morbidly obese body habitus NECK: supple, trachea midline, no neck masses, no thyroid tenderness, no thyromegaly RESPIRATORY: breathing is even & unlabored, BS CTAB CARDIAC: RRR, no murmur,no extra heart sounds, no edema GI: abdomen soft, normal BS, no masses, no tenderness, no hepatomegaly, no splenomegaly PSYCHIATRIC: the patient is alert & oriented to person, affect & behavior appropriate  ASSESSMENT/PLAN:  UTI.  New problem.  Start imipenem 500 mg IM b.i.d. for 7 days and probiotics b.i.d. for 10 days.     CPT CODE: 45625         Angela Cox, MD [facility] [phone]

## 2014-07-16 ENCOUNTER — Non-Acute Institutional Stay (SKILLED_NURSING_FACILITY): Payer: Medicare Other | Admitting: Internal Medicine

## 2014-07-16 DIAGNOSIS — N183 Chronic kidney disease, stage 3 unspecified: Secondary | ICD-10-CM

## 2014-07-16 DIAGNOSIS — I509 Heart failure, unspecified: Secondary | ICD-10-CM

## 2014-07-16 DIAGNOSIS — E1129 Type 2 diabetes mellitus with other diabetic kidney complication: Secondary | ICD-10-CM

## 2014-07-16 DIAGNOSIS — I150 Renovascular hypertension: Secondary | ICD-10-CM

## 2014-07-16 DIAGNOSIS — I5032 Chronic diastolic (congestive) heart failure: Secondary | ICD-10-CM

## 2014-07-16 NOTE — Progress Notes (Signed)
Patient ID: [name], female   DOB: 06/09/1936, 78 y.o.   MRN: [id number]         PROGRESS NOTE  DATE:   07/16/2014    [facility]  [facility]   LEVEL OF CARE: SNF  Routine Visit  CHIEF COMPLAINT:  Manage chronic kidney disease stage III, CHF, hypertension and diabetes mellitus.   HISTORY OF PRESENT ILLNESS:  REASSESSMENT OF ONGOING PROBLEM(S):  CHRONIC KIDNEY DISEASE: The patient's chronic kidney disease is unstable.  Patient denies increasing lower extremity swelling or confusion. Last BUN and creatinine are: 43-1.76 on 08-30-13. In  6-14 BUN 33/creatinine 1.46, in 9-14 BUN 43, creatinine 1.76, in 1-15 BUN 33, creatinine 1.6, in 5-15 BUN 37, creatinine 1.87, in 7-15 BUN 30, creatinine 1.38.  CHF: Patient complains of  chronic lower extremity swelling.  The patient does not relate significant weight changes, denies sob, DOE, orthopnea, PNDs, palpitations or chest pain.  CHF remains stable.  No complications form the medications being used.   HTN: Patient complains of  chronic lower extremity swelling.  Pt 's HTN remains stable.  Denies CP, sob, DOE, pedal edema, headaches, dizziness or visual disturbances.  No complications from the medications currently being used.  Last BP :142/80, 140/80, 150/90, 144/62, 144/80, 136/80, 140/80, 130/90, 140/78, 144/72, 118/66, 128/70.  DM:pt's DM remains stable.  Pt denies polyuria, polydipsia, polyphagia, changes in vision or hypoglycemic episodes.  No complications noted from the medication presently being used.  Last hemoglobin A1c is:  6.7 in 12/2012, in 7/14 hemoglobin A1c 6.6, in 10-14 HbA1c 7.5, in 1-15 hemoglobin A1c 6.6, 4-15 hemoglobin A1c 6.6, in 7-15 hemoglobin A1c 6.5  PAST MEDICAL HISTORY : Reviewed.  No changes.  CURRENT MEDICATIONS: Reviewed per [facility]  REVIEW OF SYSTEMS:  GENERAL: no change in appetite, no fatigue, no weight changes, no fever, chills or weakness RESPIRATORY: no cough, SOB, DOE, wheezing, hemoptysis CARDIAC: no  chest pain, palpitations, complains of  chronic lower extremity swelling GI: no abdominal pain, diarrhea, constipation, heart burn, nausea or vomiting  PHYSICAL EXAMINATION  VS: See vital signs section  GENERAL: no acute distress, morbidly obese body habitus NECK: supple, trachea midline, no neck masses, no thyroid tenderness, no thyromegaly RESPIRATORY: breathing is even & unlabored, BS CTAB CARDIAC: RRR, no murmur,no extra heart sounds EDEMA/VARICOSITIES:  +2 bilateral lower extremity edema  ARTERIAL:  pedal pulses nonpalpable  GI: abdomen soft, normal BS, no masses, no tenderness, no hepatomegaly, no splenomegaly PSYCHIATRIC: the patient is alert & oriented to person, affect & behavior appropriate  LABS/RADIOLOGY: 7-15 hemoglobin 10.2, MCV 94 otherwise CBC normal, BUN 30, creatinine 1.38, total protein 5.8, albumin 3.3 otherwise CMP normal 5-15 hemoglobin 10.4, MCV 95 otherwise CBC normal, BUN 37, creatinine 1.87, albumin 3.2 otherwise CMP normal 3-15 Fasting lipid panel normal 1-15 hemoglobin 10, MCV 93 otherwise CBC normal, BUN 33, creatinine 1.6, total protein 5.8, albumin 3.3 otherwise CMP normal, fasting lipid panel normal, TSH 2.876, vitamin D level 10.7 9-14 BUN 43, creatinine 1.76 otherwise BMP normal, HDL 39 otherwise Fasting lipid panel normal, uric acid 15.1  7-14 hemoglobin 9.8, MCV 92 otherwise CBC normal, albumin 3.2 otherwise liver profile normal  6/14 glc 108, bun 33, cr 1.46 ow bmp nl, TSH 1.17 01/2013:  Fasting lipid panel normal.  12/2012:  Hemoglobin 9.5, MCV 90, otherwise CBC normal.  Glucose 134, creatinine 1.48, albumin 2.9, otherwise CMP normal.   ASSESSMENT/PLAN:   Diabetes mellitus.  Well controlled.  Hyperlipidemia.  Well controlled.   Anemia of chronic  kidney disease.  Hemoglobin stable  Renovascular hypertension. Well controlled  CHF.  Adequately compensated.   Chronic kidney disease stage III.  renal functions improved  Coronary artery  disease.   Stable.   Gout-on allopurinol.  Vitamin D deficiency- on vitamin D 2 50,000 units q. monthly.   CPT CODE: 1610999308  Newton PiggGayani Y. Kerry Doryasanayaka, MD [facility] [phone]

## 2014-07-17 ENCOUNTER — Encounter (HOSPITAL_COMMUNITY): Payer: Self-pay | Admitting: Vascular Surgery

## 2014-08-22 ENCOUNTER — Non-Acute Institutional Stay (SKILLED_NURSING_FACILITY): Payer: Medicare Other | Admitting: Internal Medicine

## 2014-08-22 DIAGNOSIS — I509 Heart failure, unspecified: Secondary | ICD-10-CM

## 2014-08-22 DIAGNOSIS — N183 Chronic kidney disease, stage 3 unspecified: Secondary | ICD-10-CM

## 2014-08-22 DIAGNOSIS — I5032 Chronic diastolic (congestive) heart failure: Secondary | ICD-10-CM

## 2014-08-22 DIAGNOSIS — E1129 Type 2 diabetes mellitus with other diabetic kidney complication: Secondary | ICD-10-CM

## 2014-08-22 DIAGNOSIS — I150 Renovascular hypertension: Secondary | ICD-10-CM

## 2014-08-24 NOTE — Progress Notes (Signed)
Patient ID: FLORIDA NOLTON, female   DOB: [birth year], 78 y.o.   MRN: [id number]         PROGRESS NOTE  DATE:   08/22/2014    [facility]  [facility]   LEVEL OF CARE: SNF  Routine Visit  CHIEF COMPLAINT:  Manage chronic kidney disease stage III, CHF, hypertension and diabetes mellitus.   HISTORY OF PRESENT ILLNESS:  REASSESSMENT OF ONGOING PROBLEM(S):  CHRONIC KIDNEY DISEASE: The patient's chronic kidney disease is unstable.  Patient denies increasing lower extremity swelling or confusion. Last BUN and creatinine are: 43-1.76 on 08-30-13. In  6-14 BUN 33/creatinine 1.46, in 9-14 BUN 43, creatinine 1.76, in 1-15 BUN 33, creatinine 1.6, in 5-15 BUN 37, creatinine 1.87, in 7-15 BUN 30, creatinine 1.38.  CHF: Patient complains of  chronic lower extremity swelling.  The patient does not relate significant weight changes, denies sob, DOE, orthopnea, PNDs, palpitations or chest pain.  CHF remains stable.  No complications form the medications being used.   HTN: Patient complains of  chronic lower extremity swelling.  Pt 's HTN remains stable.  Denies CP, sob, DOE, pedal edema, headaches, dizziness or visual disturbances.  No complications from the medications currently being used.  Last BP :142/80, 140/80, 150/90, 144/62, 144/80, 136/80, 140/80, 130/90, 140/78, 144/72, 118/66, 128/70, 120/64.  DM:pt's DM remains stable.  Pt denies polyuria, polydipsia, polyphagia, changes in vision or hypoglycemic episodes.  No complications noted from the medication presently being used.  Last hemoglobin A1c is:  6.7 in 12/2012, in 7/14 hemoglobin A1c 6.6, in 10-14 HbA1c 7.5, in 1-15 hemoglobin A1c 6.6, 4-15 hemoglobin A1c 6.6, in 7-15 hemoglobin A1c 6.5  PAST MEDICAL HISTORY : Reviewed.  No changes.  CURRENT MEDICATIONS: Reviewed per [facility]  REVIEW OF SYSTEMS:  GENERAL: no change in appetite, no fatigue, no weight changes, no fever, chills or weakness RESPIRATORY: no cough, SOB, DOE, wheezing,  hemoptysis CARDIAC: no chest pain, palpitations, complains of  chronic lower extremity swelling GI: no abdominal pain, diarrhea, constipation, heart burn, nausea or vomiting  PHYSICAL EXAMINATION  VS: See vital signs section  GENERAL: no acute distress, morbidly obese body habitus NECK: supple, trachea midline, no neck masses, no thyroid tenderness, no thyromegaly RESPIRATORY: breathing is even & unlabored, BS CTAB CARDIAC: RRR, no murmur,no extra heart sounds EDEMA/VARICOSITIES:  +2 bilateral lower extremity edema  ARTERIAL:  pedal pulses nonpalpable  GI: abdomen soft, normal BS, no masses, no tenderness, no hepatomegaly, no splenomegaly PSYCHIATRIC: the patient is alert & oriented to person, affect & behavior appropriate  LABS/RADIOLOGY: 7-15 hemoglobin 10.2, MCV 94 otherwise CBC normal, BUN 30, creatinine 1.38, total protein 5.8, albumin 3.3 otherwise CMP normal 5-15 hemoglobin 10.4, MCV 95 otherwise CBC normal, BUN 37, creatinine 1.87, albumin 3.2 otherwise CMP normal 3-15 Fasting lipid panel normal 1-15 hemoglobin 10, MCV 93 otherwise CBC normal, BUN 33, creatinine 1.6, total protein 5.8, albumin 3.3 otherwise CMP normal, fasting lipid panel normal, TSH 2.876, vitamin D level 10.7 9-14 BUN 43, creatinine 1.76 otherwise BMP normal, HDL 39 otherwise Fasting lipid panel normal, uric acid 15.1  7-14 hemoglobin 9.8, MCV 92 otherwise CBC normal, albumin 3.2 otherwise liver profile normal  6/14 glc 108, bun 33, cr 1.46 ow bmp nl, TSH 1.17 01/2013:  Fasting lipid panel normal.  12/2012:  Hemoglobin 9.5, MCV 90, otherwise CBC normal.  Glucose 134, creatinine 1.48, albumin 2.9, otherwise CMP normal.   ASSESSMENT/PLAN:   Diabetes mellitus.  Well controlled.  Hyperlipidemia.  Well controlled.   Anemia of  chronic kidney disease.  Hemoglobin stable  Renovascular hypertension. Well controlled  CHF.  Adequately compensated.   Chronic kidney disease stage III.  renal functions  improved  Coronary artery disease.   Stable.   Gout-on allopurinol.  Vitamin D deficiency- on vitamin D 2 50,000 units q. monthly.   CPT CODE: 38250  Newton Pigg. Kerry Dory, MD [facility] [phone]

## 2014-10-03 ENCOUNTER — Non-Acute Institutional Stay (SKILLED_NURSING_FACILITY): Payer: Medicare Other | Admitting: Internal Medicine

## 2014-10-03 DIAGNOSIS — N183 Chronic kidney disease, stage 3 unspecified: Secondary | ICD-10-CM

## 2014-10-03 DIAGNOSIS — I150 Renovascular hypertension: Secondary | ICD-10-CM

## 2014-10-03 DIAGNOSIS — I5032 Chronic diastolic (congestive) heart failure: Secondary | ICD-10-CM

## 2014-10-03 DIAGNOSIS — E1122 Type 2 diabetes mellitus with diabetic chronic kidney disease: Secondary | ICD-10-CM

## 2014-10-03 DIAGNOSIS — N189 Chronic kidney disease, unspecified: Secondary | ICD-10-CM

## 2014-10-04 DIAGNOSIS — E119 Type 2 diabetes mellitus without complications: Secondary | ICD-10-CM | POA: Insufficient documentation

## 2014-10-04 NOTE — Progress Notes (Signed)
Patient ID: [name], female   DOB: [birth year], 78 y.o.   MRN: [id number]         PROGRESS NOTE  DATE:   10/03/2014    [facility]   LEVEL OF CARE: SNF  Routine Visit  CHIEF COMPLAINT:  Manage chronic kidney disease stage III, CHF, hypertension and diabetes mellitus.   HISTORY OF PRESENT ILLNESS:  REASSESSMENT OF ONGOING PROBLEM(S):  CHRONIC KIDNEY DISEASE: The patient's chronic kidney disease is unstable.  Patient denies increasing lower extremity swelling or confusion. Last BUN and creatinine are: 43-1.76 on 08-30-13. In  6-14 BUN 33/creatinine 1.46, in 9-14 BUN 43, creatinine 1.76, in 1-15 BUN 33, creatinine 1.6, in 5-15 BUN 37, creatinine 1.87, in 7-15 BUN 30, creatinine 1.38.  CHF: Patient complains of  chronic lower extremity swelling.  The patient does not relate significant weight changes, denies sob, DOE, orthopnea, PNDs, palpitations or chest pain.  CHF remains stable.  No complications form the medications being used.   HTN: Patient complains of  chronic lower extremity swelling.  Pt 's HTN remains stable.  Denies CP, sob, DOE, pedal edema, headaches, dizziness or visual disturbances.  No complications from the medications currently being used.  Last BP :142/80, 140/80, 150/90, 144/62, 144/80, 136/80, 140/80, 130/90, 140/78, 144/72, 118/66, 128/70, 120/64, 138/72.  DM:pt's DM remains stable.  Pt denies polyuria, polydipsia, polyphagia, changes in vision or hypoglycemic episodes.  No complications noted from the medication presently being used.  Last hemoglobin A1c is:  6.7 in 12/2012, in 7/14 hemoglobin A1c 6.6, in 10-14 HbA1c 7.5, in 1-15 hemoglobin A1c 6.6, 4-15 hemoglobin A1c 6.6, in 7-15 hemoglobin A1c 6.5  PAST MEDICAL HISTORY : Reviewed.  No changes.  [facility]S: [facility]  REVIEW OF SYSTEMS:  GENERAL: no change in appetite, no fatigue, no weight changes, no fever, chills or weakness RESPIRATORY: no cough, SOB, DOE, wheezing,  hemoptysis CARDIAC: no chest pain, palpitations, complains of  chronic lower extremity swelling GI: no abdominal pain, diarrhea, constipation, heart burn, nausea or vomiting  PHYSICAL EXAMINATION  VS: See vital signs section  GENERAL: no acute distress, morbidly obese body habitus NECK: supple, trachea midline, no neck masses, no thyroid tenderness, no thyromegaly RESPIRATORY: breathing is even & unlabored, BS CTAB CARDIAC: RRR, no murmur,no extra heart sounds EDEMA/VARICOSITIES:  +2 bilateral lower extremity edema  ARTERIAL:  pedal pulses nonpalpable  GI: abdomen soft, normal BS, no masses, no tenderness, no hepatomegaly, no splenomegaly PSYCHIATRIC: the patient is alert & oriented to person, affect & behavior appropriate  LABS/RADIOLOGY: 9-15 FLP normal 7-15 hemoglobin 10.2, MCV 94 otherwise CBC normal, BUN 30, creatinine 1.38, total protein 5.8, albumin 3.3 otherwise CMP normal 5-15 hemoglobin 10.4, MCV 95 otherwise CBC normal, BUN 37, creatinine 1.87, albumin 3.2 otherwise CMP normal 3-15 Fasting lipid panel normal 1-15 hemoglobin 10, MCV 93 otherwise CBC normal, BUN 33, creatinine 1.6, total protein 5.8, albumin 3.3 otherwise CMP normal, fasting lipid panel normal, TSH 2.876, vitamin D level 10.7 9-14 BUN 43, creatinine 1.76 otherwise BMP normal, HDL 39 otherwise Fasting lipid panel normal, uric acid 15.1  7-14 hemoglobin 9.8, MCV 92 otherwise CBC normal, albumin 3.2 otherwise liver profile normal  6/14 glc 108, bun 33, cr 1.46 ow bmp nl, TSH 1.17 01/2013:  Fasting lipid panel normal.  12/2012:  Hemoglobin 9.5, MCV 90, otherwise CBC normal.  Glucose 134, creatinine 1.48, albumin 2.9, otherwise CMP normal.   ASSESSMENT/PLAN:   Diabetes mellitus.  Well controlled.  Hyperlipidemia.  Well controlled.  Anemia of chronic kidney disease.  Hemoglobin stable  Renovascular hypertension. Well controlled  CHF.  Adequately compensated.   Chronic kidney disease stage III.   renal functions improved  Coronary artery disease.   Stable.   Gout-on allopurinol.  Vitamin D deficiency- on vitamin D 2 50,000 units q. monthly.   CPT CODE: 38937  Newton Pigg. Kerry Dory, MD [facility] [phone]

## 2014-11-05 ENCOUNTER — Encounter (HOSPITAL_COMMUNITY): Payer: Self-pay | Admitting: Emergency Medicine

## 2014-11-05 ENCOUNTER — Inpatient Hospital Stay (HOSPITAL_COMMUNITY)
Admission: EM | Admit: 2014-11-05 | Discharge: 2014-11-06 | DRG: 689 | Disposition: A | Discharge status: Skilled Nursing Facility | Payer: Medicare Other | Attending: Internal Medicine | Admitting: Internal Medicine

## 2014-11-05 ENCOUNTER — Emergency Department (HOSPITAL_COMMUNITY): Payer: Medicare Other

## 2014-11-05 DIAGNOSIS — Z7982 Long term (current) use of aspirin: Secondary | ICD-10-CM

## 2014-11-05 DIAGNOSIS — G4733 Obstructive sleep apnea (adult) (pediatric): Secondary | ICD-10-CM | POA: Diagnosis present

## 2014-11-05 DIAGNOSIS — N183 Chronic kidney disease, stage 3 unspecified: Secondary | ICD-10-CM | POA: Diagnosis present

## 2014-11-05 DIAGNOSIS — Z794 Long term (current) use of insulin: Secondary | ICD-10-CM

## 2014-11-05 DIAGNOSIS — E785 Hyperlipidemia, unspecified: Secondary | ICD-10-CM | POA: Diagnosis present

## 2014-11-05 DIAGNOSIS — N390 Urinary tract infection, site not specified: Secondary | ICD-10-CM | POA: Diagnosis not present

## 2014-11-05 DIAGNOSIS — Z79899 Other long term (current) drug therapy: Secondary | ICD-10-CM

## 2014-11-05 DIAGNOSIS — M1990 Unspecified osteoarthritis, unspecified site: Secondary | ICD-10-CM | POA: Diagnosis present

## 2014-11-05 DIAGNOSIS — Z955 Presence of coronary angioplasty implant and graft: Secondary | ICD-10-CM

## 2014-11-05 DIAGNOSIS — I129 Hypertensive chronic kidney disease with stage 1 through stage 4 chronic kidney disease, or unspecified chronic kidney disease: Secondary | ICD-10-CM | POA: Diagnosis present

## 2014-11-05 DIAGNOSIS — E1122 Type 2 diabetes mellitus with diabetic chronic kidney disease: Secondary | ICD-10-CM

## 2014-11-05 DIAGNOSIS — D631 Anemia in chronic kidney disease: Secondary | ICD-10-CM | POA: Diagnosis present

## 2014-11-05 DIAGNOSIS — F419 Anxiety disorder, unspecified: Secondary | ICD-10-CM | POA: Diagnosis present

## 2014-11-05 DIAGNOSIS — R4189 Other symptoms and signs involving cognitive functions and awareness: Secondary | ICD-10-CM

## 2014-11-05 DIAGNOSIS — E119 Type 2 diabetes mellitus without complications: Secondary | ICD-10-CM

## 2014-11-05 DIAGNOSIS — I5032 Chronic diastolic (congestive) heart failure: Secondary | ICD-10-CM | POA: Diagnosis present

## 2014-11-05 DIAGNOSIS — I5042 Chronic combined systolic (congestive) and diastolic (congestive) heart failure: Secondary | ICD-10-CM | POA: Diagnosis present

## 2014-11-05 DIAGNOSIS — I2510 Atherosclerotic heart disease of native coronary artery without angina pectoris: Secondary | ICD-10-CM | POA: Diagnosis present

## 2014-11-05 DIAGNOSIS — N189 Chronic kidney disease, unspecified: Secondary | ICD-10-CM

## 2014-11-05 DIAGNOSIS — I429 Cardiomyopathy, unspecified: Secondary | ICD-10-CM | POA: Diagnosis present

## 2014-11-05 DIAGNOSIS — R4182 Altered mental status, unspecified: Secondary | ICD-10-CM

## 2014-11-05 DIAGNOSIS — K219 Gastro-esophageal reflux disease without esophagitis: Secondary | ICD-10-CM | POA: Diagnosis present

## 2014-11-05 DIAGNOSIS — G9340 Encephalopathy, unspecified: Secondary | ICD-10-CM | POA: Diagnosis present

## 2014-11-05 DIAGNOSIS — I5022 Chronic systolic (congestive) heart failure: Secondary | ICD-10-CM

## 2014-11-05 DIAGNOSIS — E11359 Type 2 diabetes mellitus with proliferative diabetic retinopathy without macular edema: Secondary | ICD-10-CM | POA: Diagnosis present

## 2014-11-05 DIAGNOSIS — I10 Essential (primary) hypertension: Secondary | ICD-10-CM | POA: Diagnosis present

## 2014-11-05 LAB — URINALYSIS, ROUTINE W REFLEX MICROSCOPIC
Bilirubin Urine: NEGATIVE
Glucose, UA: NEGATIVE mg/dL
Ketones, ur: NEGATIVE mg/dL
Nitrite: NEGATIVE
Protein, ur: >300 mg/dL — AB
SPECIFIC GRAVITY, URINE: 1.012 (ref 1.005–1.030)
UROBILINOGEN UA: 0.2 mg/dL (ref 0.0–1.0)
pH: 8.5 — ABNORMAL HIGH (ref 5.0–8.0)

## 2014-11-05 LAB — COMPREHENSIVE METABOLIC PANEL
ALT: 22 U/L (ref 0–35)
AST: 23 U/L (ref 0–37)
Albumin: 2.9 g/dL — ABNORMAL LOW (ref 3.5–5.2)
Alkaline Phosphatase: 123 U/L — ABNORMAL HIGH (ref 39–117)
Anion gap: 16 — ABNORMAL HIGH (ref 5–15)
BUN: 29 mg/dL — ABNORMAL HIGH (ref 6–23)
CALCIUM: 9.1 mg/dL (ref 8.4–10.5)
CO2: 23 mEq/L (ref 19–32)
Chloride: 100 mEq/L (ref 96–112)
Creatinine, Ser: 1.56 mg/dL — ABNORMAL HIGH (ref 0.50–1.10)
GFR calc non Af Amer: 31 mL/min — ABNORMAL LOW (ref >90)
GFR, EST AFRICAN AMERICAN: 36 mL/min — AB (ref >90)
GLUCOSE: 105 mg/dL — AB (ref 70–99)
Potassium: 4.3 mEq/L (ref 3.7–5.3)
SODIUM: 139 meq/L (ref 137–147)
TOTAL PROTEIN: 7.5 g/dL (ref 6.0–8.3)
Total Bilirubin: 0.3 mg/dL (ref 0.3–1.2)

## 2014-11-05 LAB — PROTIME-INR
INR: 1.19 (ref 0.00–1.49)
PROTHROMBIN TIME: 15.3 s — AB (ref 11.6–15.2)

## 2014-11-05 LAB — CBC
HCT: 35.9 % — ABNORMAL LOW (ref 36.0–46.0)
Hemoglobin: 11.4 g/dL — ABNORMAL LOW (ref 12.0–15.0)
MCH: 30.3 pg (ref 26.0–34.0)
MCHC: 31.8 g/dL (ref 30.0–36.0)
MCV: 95.5 fL (ref 78.0–100.0)
PLATELETS: 219 10*3/uL (ref 150–400)
RBC: 3.76 MIL/uL — ABNORMAL LOW (ref 3.87–5.11)
RDW: 14.7 % (ref 11.5–15.5)
WBC: 6.1 10*3/uL (ref 4.0–10.5)

## 2014-11-05 LAB — GLUCOSE, CAPILLARY
GLUCOSE-CAPILLARY: 98 mg/dL (ref 70–99)
Glucose-Capillary: 106 mg/dL — ABNORMAL HIGH (ref 70–99)

## 2014-11-05 LAB — URINE MICROSCOPIC-ADD ON

## 2014-11-05 LAB — TSH: TSH: 0.657 u[IU]/mL (ref 0.350–4.500)

## 2014-11-05 LAB — TROPONIN I: Troponin I: <0.3 ng/mL (ref <0.30)

## 2014-11-05 LAB — CBG MONITORING, ED: GLUCOSE-CAPILLARY: 98 mg/dL (ref 70–99)

## 2014-11-05 MED ORDER — MORPHINE SULFATE 2 MG/ML IJ SOLN: 1.0000 mg | INTRAMUSCULAR | Status: DC | PRN

## 2014-11-05 MED ORDER — DEXTROSE 5 % IV SOLN
1.0000 g | INTRAVENOUS | Status: DC
  Administered 2014-11-06: 1 g via INTRAVENOUS
  Filled 2014-11-05: qty 10

## 2014-11-05 MED ORDER — QUETIAPINE FUMARATE 25 MG PO TABS
25.0000 mg | ORAL_TABLET | Freq: Two times a day (BID) | ORAL | Status: DC
  Administered 2014-11-06 (×2): 25 mg via ORAL
  Filled 2014-11-05 (×3): qty 1

## 2014-11-05 MED ORDER — DEXTROSE 5 % IV SOLN: 1.0000 g | INTRAVENOUS | Status: DC

## 2014-11-05 MED ORDER — ONDANSETRON HCL 4 MG PO TABS
4.0000 mg | ORAL_TABLET | Freq: Four times a day (QID) | ORAL | Status: DC | PRN
Start: 2014-11-05 — End: 2014-11-06

## 2014-11-05 MED ORDER — INSULIN GLARGINE 100 UNIT/ML ~~LOC~~ SOLN
15.0000 [IU] | Freq: Every day | SUBCUTANEOUS | Status: DC
  Filled 2014-11-05: qty 0.15

## 2014-11-05 MED ORDER — HYDRALAZINE HCL 50 MG PO TABS
50.0000 mg | ORAL_TABLET | Freq: Three times a day (TID) | ORAL | Status: DC
  Administered 2014-11-05 – 2014-11-06 (×3): 50 mg via ORAL
  Filled 2014-11-05 (×5): qty 1

## 2014-11-05 MED ORDER — SODIUM CHLORIDE 0.9 % IJ SOLN
3.0000 mL | Freq: Two times a day (BID) | INTRAMUSCULAR | Status: DC
  Administered 2014-11-06 (×2): 3 mL via INTRAVENOUS

## 2014-11-05 MED ORDER — HEPARIN SODIUM (PORCINE) 5000 UNIT/ML IJ SOLN
5000.0000 [IU] | Freq: Three times a day (TID) | INTRAMUSCULAR | Status: DC
  Administered 2014-11-05 – 2014-11-06 (×4): 5000 [IU] via SUBCUTANEOUS
  Filled 2014-11-05 (×3): qty 1

## 2014-11-05 MED ORDER — ONDANSETRON HCL 4 MG/2ML IJ SOLN: 4.0000 mg | Freq: Four times a day (QID) | INTRAMUSCULAR | Status: DC | PRN

## 2014-11-05 MED ORDER — ACETAMINOPHEN 325 MG PO TABS: 650.0000 mg | ORAL_TABLET | Freq: Four times a day (QID) | ORAL | Status: DC | PRN

## 2014-11-05 MED ORDER — INSULIN ASPART 100 UNIT/ML ~~LOC~~ SOLN: 0.0000 [IU] | Freq: Three times a day (TID) | SUBCUTANEOUS | Status: DC

## 2014-11-05 MED ORDER — ASPIRIN EC 81 MG PO TBEC
81.0000 mg | DELAYED_RELEASE_TABLET | Freq: Every day | ORAL | Status: DC
  Administered 2014-11-05 – 2014-11-06 (×2): 81 mg via ORAL
  Filled 2014-11-05 (×3): qty 1

## 2014-11-05 MED ORDER — SODIUM CHLORIDE 0.9 % IV SOLN
INTRAVENOUS | Status: AC
  Administered 2014-11-05: 13:00:00 via INTRAVENOUS

## 2014-11-05 MED ORDER — POLYETHYLENE GLYCOL 3350 17 G PO PACK
17.0000 g | PACK | Freq: Every day | ORAL | Status: DC | PRN
  Filled 2014-11-05: qty 1

## 2014-11-05 MED ORDER — SODIUM CHLORIDE 0.9 % IV SOLN
INTRAVENOUS | Status: AC
  Administered 2014-11-05: 19:00:00 via INTRAVENOUS

## 2014-11-05 MED ORDER — LORAZEPAM 0.5 MG PO TABS: 0.2500 mg | ORAL_TABLET | Freq: Four times a day (QID) | ORAL | Status: DC | PRN

## 2014-11-05 MED ORDER — HYDROCODONE-ACETAMINOPHEN 5-325 MG PO TABS: 1.0000 | ORAL_TABLET | ORAL | Status: DC | PRN

## 2014-11-05 MED ORDER — SODIUM CHLORIDE 0.9 % IV SOLN
INTRAVENOUS | Status: DC
  Administered 2014-11-05: 10 mL/h via INTRAVENOUS

## 2014-11-05 MED ORDER — CARVEDILOL 25 MG PO TABS
25.0000 mg | ORAL_TABLET | Freq: Two times a day (BID) | ORAL | Status: DC
  Administered 2014-11-05 – 2014-11-06 (×2): 25 mg via ORAL
  Filled 2014-11-05 (×4): qty 1

## 2014-11-05 MED ORDER — ATORVASTATIN CALCIUM 20 MG PO TABS
20.0000 mg | ORAL_TABLET | Freq: Every day | ORAL | Status: DC
  Administered 2014-11-05: 20 mg via ORAL
  Filled 2014-11-05 (×2): qty 1

## 2014-11-05 MED ORDER — AMLODIPINE BESYLATE 5 MG PO TABS
5.0000 mg | ORAL_TABLET | Freq: Every day | ORAL | Status: DC
  Administered 2014-11-05 – 2014-11-06 (×2): 5 mg via ORAL
  Filled 2014-11-05 (×3): qty 1

## 2014-11-05 MED ORDER — DEXTROSE 5 % IV SOLN
1.0000 g | Freq: Once | INTRAVENOUS | Status: AC
  Administered 2014-11-05: 1 g via INTRAVENOUS
  Filled 2014-11-05: qty 10

## 2014-11-05 MED ORDER — ACETAMINOPHEN 650 MG RE SUPP
650.0000 mg | Freq: Four times a day (QID) | RECTAL | Status: DC | PRN
Start: 2014-11-05 — End: 2014-11-06

## 2014-11-05 NOTE — ED Provider Notes (Signed)
CSN: [id number]     Arrival date & time 11/05/14  1002 History   First MD Initiated Contact with Patient 11/05/14 1003     Chief Complaint  Patient presents with  . Altered Mental Status     (Consider location/radiation/quality/duration/timing/severity/associated sxs/prior Treatment) The history is provided by the patient and the EMS personnel. The history is limited by the condition of the patient.  pt with hx iddm, cad, htn, from ecf w c/o decreased responsiveness this morning.  Pt was last seen normal last evening. This morning when staff checked on pt, pt was not verbally responsive. Ems notes pt awake, and will follow simple commands, but not verbally responding to them.  They indicate at baseline pt is conversant, and is in chair/wheelchair, and blind.  No report of trauma or fall. No report of fever.  Pt not verbally responsive - level 5 caveat.     Past Medical History  Diagnosis Date  . Coronary artery disease     LHC 05/25/06: No renal artery stenosis, proximal LAD 40%, proximal D1 40%  . Diabetes mellitus     a1c 7.3 in 10/2011  . Hypertension   . Systolic heart failure     Echo 10/09: EF 30-35%. Echo 2/13: EF 50-55%.  Marland Kitchen. NICM (nonischemic cardiomyopathy)   . Blood transfusion without reported diagnosis   . Anxiety   . Arthritis   . Chronic diastolic CHF (congestive heart failure)   . GLAUCOMA 12/06/2008    Vision Loss right eye   . 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    . Visual hallucinations 07/15/2011  . Stage III chronic kidney disease 01/10/2007  . OSA (obstructive sleep apnea) 03/04/2012  . HYPERLIPIDEMIA 12/27/2006  . GERD (gastroesophageal reflux disease)    Past Surgical History  Procedure Laterality Date  . Coronary angioplasty with stent placement    . Eye surgery  2012    both eyes   Family History  Problem Relation Age of Onset  . Heart disease Maternal Grandmother   . Prostate cancer Father    . Colon cancer Maternal Aunt    History  Substance Use Topics  . Smoking status: Never Smoker   . Smokeless tobacco: Never Used     Comment: does have second hand smoke exposure  . Alcohol Use: No   OB History    No data available        Review of Systems  Unable to perform ROS: Patient nonverbal  level 5 caveat, pt not verbally responsive.     Allergies  Lisinopril  Home Medications   Prior to Admission medications   Medication Sig Start Date End Date Taking? Authorizing Provider  allopurinol (ZYLOPRIM) 100 MG tablet Take 200 mg by mouth daily.    Historical Provider, MD  amLODipine (NORVASC) 5 MG tablet Take 5 mg by mouth daily.    Historical Provider, MD  aspirin EC 81 MG tablet Take 1 tablet (81 mg total) by mouth daily. 04/08/12   Denna HaggardNodira J Karimova, MD  atorvastatin (LIPITOR) 20 MG tablet Take 1 tablet (20 mg total) by mouth daily. 12/30/12   Bronson Curbarolyn Ziemer, MD  brimonidine (ALPHAGAN) 0.2 % ophthalmic solution Place 1 drop into both eyes daily.    Historical Provider, MD  carvedilol (COREG) 25 MG tablet Take 1 tablet (25 mg total) by mouth 2 (two) times daily with a meal. 04/08/12   Denna HaggardNodira J Karimova, MD  docusate sodium 100 MG CAPS Take 100  mg by mouth 2 (two) times daily. 09/13/12   Emory Nani Skillern, MD  furosemide (LASIX) 20 MG tablet Take 20 mg by mouth 2 (two) times daily.    Historical Provider, MD  glucose blood test strip 1 each by Other route 2 (two) times daily.    Denna Haggard, MD  hydrALAZINE (APRESOLINE) 50 MG tablet Take 50 mg by mouth 3 (three) times daily.    Historical Provider, MD  insulin glargine (LANTUS) 100 UNIT/ML injection Inject 15 Units into the skin daily. 12/30/12   Bronson Curb, MD  LORazepam (ATIVAN) 0.5 MG tablet 0.25 mg. Take 1/2 tablet by mouth every 6 hours as needed for anxiety or exercise. 10/27/13   Cari Caraway, FNP  QUEtiapine (SEROQUEL) 25 MG tablet Take 25 mg by mouth 2 (two) times daily.    Historical Provider, MD   sennosides-docusate sodium (SENOKOT-S) 8.6-50 MG tablet Take 2 tablets by mouth daily.    Historical Provider, MD   BP 148/68 mmHg  Pulse 77  Temp(Src) 98 F (36.7 C) (Rectal)  Resp 18  SpO2 100% Physical Exam  Constitutional: She appears well-developed and well-nourished. No distress.  HENT:  Head: Atraumatic.  Mouth/Throat: Oropharynx is clear and moist.  Eyes: Conjunctivae are normal. No scleral icterus.  Neck: Normal range of motion. Neck supple. No tracheal deviation present. No thyromegaly present.  No bruit  Cardiovascular: Normal rate, regular rhythm, normal heart sounds and intact distal pulses.   Pulmonary/Chest: Effort normal and breath sounds normal. No respiratory distress.  Abdominal: Soft. Normal appearance and bowel sounds are normal. She exhibits no distension and no mass. There is no tenderness. There is no rebound.  Genitourinary:  No cva tenderness  Musculoskeletal: Normal range of motion. She exhibits no edema or tenderness.  Good rom bil ext without pain or focal bony tenderness. CTLS spine, non tender, aligned, no step off.   Neurological: She is alert.  Awake and alert appearing. Moves mouth in response to questions, but no audible/vocal response.  Equal grips, follows simple commands, moves bil extremities.   Skin: Skin is warm and dry. No rash noted. She is not diaphoretic.  Psychiatric:  Alert appearing.   Nursing note and vitals reviewed.   ED Course  Procedures (including critical care time) Labs Review  Results for orders placed or performed during the hospital encounter of 11/05/14  CBC  Result Value Ref Range   WBC 6.1 4.0 - 10.5 K/uL   RBC 3.76 (L) 3.87 - 5.11 MIL/uL   Hemoglobin 11.4 (L) 12.0 - 15.0 g/dL   HCT 16.1 (L) 09.6 - 04.5 %   MCV 95.5 78.0 - 100.0 fL   MCH 30.3 26.0 - 34.0 pg   MCHC 31.8 30.0 - 36.0 g/dL   RDW 40.9 81.1 - 91.4 %   Platelets 219 150 - 400 K/uL  Comprehensive metabolic panel  Result Value Ref Range   Sodium  139 137 - 147 mEq/L   Potassium 4.3 3.7 - 5.3 mEq/L   Chloride 100 96 - 112 mEq/L   CO2 23 19 - 32 mEq/L   Glucose, Bld 105 (H) 70 - 99 mg/dL   BUN 29 (H) 6 - 23 mg/dL   Creatinine, Ser 7.82 (H) 0.50 - 1.10 mg/dL   Calcium 9.1 8.4 - 95.6 mg/dL   Total Protein 7.5 6.0 - 8.3 g/dL   Albumin 2.9 (L) 3.5 - 5.2 g/dL   AST 23 0 - 37 U/L   ALT 22 0 - 35 U/L  Alkaline Phosphatase 123 (H) 39 - 117 U/L   Total Bilirubin 0.3 0.3 - 1.2 mg/dL   GFR calc non Af Amer 31 (L) >90 mL/min   GFR calc Af Amer 36 (L) >90 mL/min   Anion gap 16 (H) 5 - 15  Urinalysis, Routine w reflex microscopic  Result Value Ref Range   Color, Urine YELLOW YELLOW   APPearance TURBID (A) CLEAR   Specific Gravity, Urine 1.012 1.005 - 1.030   pH 8.5 (H) 5.0 - 8.0   Glucose, UA NEGATIVE NEGATIVE mg/dL   Hgb urine dipstick MODERATE (A) NEGATIVE   Bilirubin Urine NEGATIVE NEGATIVE   Ketones, ur NEGATIVE NEGATIVE mg/dL   Protein, ur >741 (A) NEGATIVE mg/dL   Urobilinogen, UA 0.2 0.0 - 1.0 mg/dL   Nitrite NEGATIVE NEGATIVE   Leukocytes, UA LARGE (A) NEGATIVE  Troponin I  Result Value Ref Range   Troponin I <0.30 <0.30 ng/mL  Urine microscopic-add on  Result Value Ref Range   Squamous Epithelial / LPF FEW (A) RARE   WBC, UA TOO NUMEROUS TO COUNT <3 WBC/hpf   RBC / HPF 7-10 <3 RBC/hpf   Bacteria, UA MANY (A) RARE  CBG monitoring, ED  Result Value Ref Range   Glucose-Capillary 98 70 - 99 mg/dL   Ct Head Wo Contrast  11/05/2014   CLINICAL DATA:  Found unresponsive at nursing home.  EXAM: CT HEAD WITHOUT CONTRAST  TECHNIQUE: Contiguous axial images were obtained from the base of the skull through the vertex without intravenous contrast.  COMPARISON:  09/12/2012.  FINDINGS: No evidence of an acute infarct, acute hemorrhage, mass lesion, mass effect or hydrocephalus. Mild atrophy. Mild periventricular low attenuation. There may be a tiny remote lacunar infarct in the left basal ganglia. Visualized portions of the paranasal  sinuses and mastoid air cells are clear.  IMPRESSION: 1. No acute intracranial abnormality. 2. Mild atrophy and mild chronic microvascular white matter ischemic changes.   Electronically Signed   By: Leanna Battles M.D.   On: 11/05/2014 11:14        EKG Interpretation   Date/Time:  Monday November 05 2014 10:19:00 EST Ventricular Rate:  76 PR Interval:    QRS Duration: 136 QT Interval:  474 QTC Calculation: 533 R Axis:   12 Text Interpretation:  Sinus rhythm Non-specific intra-ventricular  conduction delay Non-specific ST-t changes Poor data quality Confirmed by  Denton Lank  MD, Caryn Bee ([id number]) on 11/05/2014 11:37:03 AM      MDM  Iv ns. Monitor. Pulse ox.   Reviewed nursing notes and prior charts for additional history.   uti on labs, urine culture sent.  Rocephin iv.  Ivf.  Recheck, no change from prior.  Med service contacted for admission.     Suzi Roots, MD 11/05/14 [phone]

## 2014-11-05 NOTE — ED Notes (Signed)
Patient transported to CT 

## 2014-11-05 NOTE — ED Notes (Signed)
To ED via [facility] from [facility], per staff, pt was normal last night, talking normally, on EMS arrival was unresponsive, responds to pain on arrival, CBG 115, VSS

## 2014-11-05 NOTE — ED Notes (Signed)
Spoke with and updated pt's son Gerlene Burdock at [phone]

## 2014-11-05 NOTE — H&P (Signed)
[facility] History and Physical  [name] ZOX:[id number] DOB: 10-11-1936 DOA: 11/05/2014  Referring physician: EDP PCP: Georgann Housekeeper, MD   Chief Complaint: acute encephalopathy and altered mental status  HPI: [name] is a 78 y.o. female with past medical history of diabetes mellitus, HTN and chronic systolic CHF, brought into the hospital because of altered mental status. Patient lives in [facility], she was noticed by staff earlier today she is less reactive and nonverbal. She was brought to the hospital for further evaluation. In the ED initially she was noncommunicating but after some fluids and antibiotics started she started to talk and she is almost back to her baseline. Blood work did not show a lot but her urinalysis showed TNTC WBCs, patient admitted to the hospital for UTI.  Review of Systems:  Constitutional: feels weak Eyes: negative for irritation, redness and visual disturbance Ears, nose, mouth, throat, and face: negative for earaches, epistaxis, nasal congestion and sore throat Respiratory: negative for cough, dyspnea on exertion, sputum and wheezing Cardiovascular: negative for chest pain, dyspnea, lower extremity edema, orthopnea, palpitations and syncope Gastrointestinal: negative for abdominal pain, constipation, diarrhea, melena, nausea and vomiting Genitourinary:negative for dysuria, frequency and hematuria Hematologic/lymphatic: negative for bleeding, easy bruising and lymphadenopathy Musculoskeletal:negative for arthralgias, muscle weakness and stiff joints Neurological: was not able to communicate earlier Endocrine: negative for diabetic symptoms including polydipsia, polyuria and weight loss Allergic/Immunologic: negative for anaphylaxis, hay fever and urticaria  Past Medical History  Diagnosis Date  . Coronary artery disease     LHC 05/25/06: No renal artery stenosis, proximal LAD 40%, proximal D1 40%  . Diabetes mellitus     a1c 7.3 in 10/2011  . Hypertension   . Systolic heart failure     Echo 10/09: EF 30-35%. Echo 2/13: EF 50-55%.  Marland Kitchen NICM (nonischemic cardiomyopathy)   . Blood transfusion without reported diagnosis   . Anxiety   . Arthritis   . Chronic diastolic CHF (congestive heart failure)   . GLAUCOMA 12/06/2008    Vision Loss right eye   . 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    . Visual hallucinations 07/15/2011  . Stage III chronic kidney disease 01/10/2007  . OSA (obstructive sleep apnea) 03/04/2012  . HYPERLIPIDEMIA 12/27/2006  . GERD (gastroesophageal reflux disease)    Past Surgical History  Procedure Laterality Date  . Coronary angioplasty with stent placement    . Eye surgery  2012    both eyes   Social History:   reports that she has never smoked. She has never used smokeless tobacco. She reports that she does not drink alcohol or use illicit drugs.  Allergies  Allergen Reactions  . Lisinopril     REACTION: cough    Family History  Problem Relation Age of Onset  . Heart disease Maternal Grandmother   . Prostate cancer Father   . Colon cancer Maternal Aunt      Prior to Admission medications   Medication Sig Start Date End Date Taking? Authorizing Provider  allopurinol (ZYLOPRIM) 100 MG tablet Take 200 mg by mouth daily.    Historical Provider, MD  amLODipine (NORVASC) 5 MG tablet Take 5 mg by mouth daily.    Historical Provider, MD  aspirin EC 81 MG tablet Take 1 tablet (81 mg total) by mouth daily. 04/08/12   Denna Haggard, MD  atorvastatin (LIPITOR) 20 MG tablet Take 1 tablet (20 mg total) by mouth daily. 12/30/12  Bronson Curbarolyn Ziemer, MD  brimonidine (ALPHAGAN) 0.2 % ophthalmic solution Place 1 drop into both eyes daily.    Historical Provider, MD  carvedilol (COREG) 25 MG tablet Take 1 tablet (25 mg total) by mouth 2 (two) times daily with a meal. 04/08/12   Denna HaggardNodira J Karimova, MD  docusate sodium 100 MG CAPS Take 100 mg  by mouth 2 (two) times daily. 09/13/12   Emory Nani Skillern McTyre, MD  furosemide (LASIX) 20 MG tablet Take 20 mg by mouth 2 (two) times daily.    Historical Provider, MD  glucose blood test strip 1 each by Other route 2 (two) times daily.    Denna HaggardNodira J Karimova, MD  hydrALAZINE (APRESOLINE) 50 MG tablet Take 50 mg by mouth 3 (three) times daily.    Historical Provider, MD  insulin glargine (LANTUS) 100 UNIT/ML injection Inject 15 Units into the skin daily. 12/30/12   Bronson Curbarolyn Ziemer, MD  LORazepam (ATIVAN) 0.5 MG tablet 0.25 mg. Take 1/2 tablet by mouth every 6 hours as needed for anxiety or exercise. 10/27/13   Cari Carawayonna Odem, FNP  QUEtiapine (SEROQUEL) 25 MG tablet Take 25 mg by mouth 2 (two) times daily.    Historical Provider, MD  sennosides-docusate sodium (SENOKOT-S) 8.6-50 MG tablet Take 2 tablets by mouth daily.    Historical Provider, MD   Physical Exam: Filed Vitals:   11/05/14 1230  BP: 144/71  Pulse: 70  Temp:   Resp: 18   Constitutional: Oriented to person, place, and time. Well-developed and well-nourished. Cooperative.  Head: Normocephalic and atraumatic.  Nose: Nose normal.  Mouth/Throat: Uvula is midline, oropharynx is clear and moist and mucous membranes are normal.  Eyes: Conjunctivae and EOM are normal. Pupils are equal, round, and reactive to light.  Neck: Trachea normal and normal range of motion. Neck supple.  Cardiovascular: Normal rate, regular rhythm, S1 normal, S2 normal, normal heart sounds and intact distal pulses.   Pulmonary/Chest: Effort normal and breath sounds normal.  Abdominal: Soft. Bowel sounds are normal. There is no hepatosplenomegaly. There is no tenderness.  Musculoskeletal: Normal range of motion.  Neurological: Alert and oriented to person, place, and time. Has normal strength. No cranial nerve deficit or sensory deficit.  Skin: Skin is warm, dry and intact.  Psychiatric: Has a normal mood and affect. Speech is normal and behavior is normal.   Labs on  Admission:  Basic Metabolic Panel:  Recent Labs Lab 11/05/14 1020  NA 139  K 4.3  CL 100  CO2 23  GLUCOSE 105*  BUN 29*  CREATININE 1.56*  CALCIUM 9.1   Liver Function Tests:  Recent Labs Lab 11/05/14 1020  AST 23  ALT 22  ALKPHOS 123*  BILITOT 0.3  PROT 7.5  ALBUMIN 2.9*   No results for input(s): LIPASE, AMYLASE in the last 168 hours. No results for input(s): AMMONIA in the last 168 hours. CBC:  Recent Labs Lab 11/05/14 1020  WBC 6.1  HGB 11.4*  HCT 35.9*  MCV 95.5  PLT 219   Cardiac Enzymes:  Recent Labs Lab 11/05/14 1020  TROPONINI <0.30    BNP (last 3 results) No results for input(s): PROBNP in the last 8760 hours. CBG:  Recent Labs Lab 11/05/14 1007  GLUCAP 98    Radiological Exams on Admission: Ct Head Wo Contrast  11/05/2014   CLINICAL DATA:  Found unresponsive at nursing home.  EXAM: CT HEAD WITHOUT CONTRAST  TECHNIQUE: Contiguous axial images were obtained from the base of the skull through the vertex without  intravenous contrast.  COMPARISON:  09/12/2012.  FINDINGS: No evidence of an acute infarct, acute hemorrhage, mass lesion, mass effect or hydrocephalus. Mild atrophy. Mild periventricular low attenuation. There may be a tiny remote lacunar infarct in the left basal ganglia. Visualized portions of the paranasal sinuses and mastoid air cells are clear.  IMPRESSION: 1. No acute intracranial abnormality. 2. Mild atrophy and mild chronic microvascular white matter ischemic changes.   Electronically Signed   By: Leanna Battles M.D.   On: 11/05/2014 11:14   CT of the head reviewed independently  EKG: Independently reviewed.   Assessment/Plan Principal Problem:   UTI (lower urinary tract infection) Active Problems:   Essential hypertension   Chronic systolic heart failure   Stage III chronic kidney disease   Diabetes   Acute encephalopathy     UTI Urinalysis consistent with UTI with TNTC WBCs Patient started on Rocephin in the  ED, we'll continue Rocephin.  Adjust antibiotics according to culture results.  Acute encephalopathy/altered mental status Patient earlier was not able to talk and communicate. CT scan was done and showed no acute events. Per ED staff after antibiotics and IV fluids as started patient is started to talk and communicate appropriately. This is improving, I think this is likely secondary to UTI.  Chronic systolic CHF Patient is started on IV fluids, this is going to be only for 1 day as patient has chronic systolic CHF. Lasix held at the time of admission, likely need to be restarted tomorrow doctor tomorrow. Continue hydralazine, Coreg, her LVEF seems to be improved, in February was 55%.  CKD stage III Patient seems to be around her baseline, creatinine is 1.56, better than in January of last year. Check BMP in a.m.  Diabetes mellitus type 2 Insulin-dependent diabetes mellitus, Lantus restarted at home dose. Check hemoglobin A1c, patient will be on insulin sliding scale. Carbohydrate modified diet.  Generalized weakness Patient is from nursing home, PT/OT to evaluate and treat likely back to the nursing home at the time of discharge.  Code Status: full code Family Communication:  Disposition Plan: inpatient, MedSurg.  Time spent: 70 minutes  [facility] Pager [phone]

## 2014-11-05 NOTE — ED Notes (Signed)
Admitting MD to bedside 

## 2014-11-05 NOTE — Progress Notes (Signed)
Admission note:  Arrival Method: Stretcher from ED Mental Orientation:A&Ox4 Telemetry: Box 7 CCMD notified Assessment: See docflowsheet Skin: Intact IV: L A/C Pain: Denies Tubes: N/a Safety Measures: Bed alarm in place, call light and bedside table ans belongings in reach. Admission Screening: To be completed 6700 Orientation: Patient has been oriented to the unit, staff and to the room.

## 2014-11-06 DIAGNOSIS — N183 Chronic kidney disease, stage 3 unspecified: Secondary | ICD-10-CM | POA: Diagnosis present

## 2014-11-06 DIAGNOSIS — D631 Anemia in chronic kidney disease: Secondary | ICD-10-CM | POA: Diagnosis present

## 2014-11-06 DIAGNOSIS — I5032 Chronic diastolic (congestive) heart failure: Secondary | ICD-10-CM

## 2014-11-06 LAB — BASIC METABOLIC PANEL
ANION GAP: 14 (ref 5–15)
BUN: 26 mg/dL — ABNORMAL HIGH (ref 6–23)
CALCIUM: 8.9 mg/dL (ref 8.4–10.5)
CO2: 23 mEq/L (ref 19–32)
CREATININE: 1.46 mg/dL — AB (ref 0.50–1.10)
Chloride: 103 mEq/L (ref 96–112)
GFR, EST AFRICAN AMERICAN: 39 mL/min — AB (ref >90)
GFR, EST NON AFRICAN AMERICAN: 34 mL/min — AB (ref >90)
Glucose, Bld: 106 mg/dL — ABNORMAL HIGH (ref 70–99)
Potassium: 3.8 mEq/L (ref 3.7–5.3)
SODIUM: 140 meq/L (ref 137–147)

## 2014-11-06 LAB — CBC
HCT: 31.8 % — ABNORMAL LOW (ref 36.0–46.0)
Hemoglobin: 10.4 g/dL — ABNORMAL LOW (ref 12.0–15.0)
MCH: 31.6 pg (ref 26.0–34.0)
MCHC: 32.7 g/dL (ref 30.0–36.0)
MCV: 96.7 fL (ref 78.0–100.0)
PLATELETS: 218 10*3/uL (ref 150–400)
RBC: 3.29 MIL/uL — ABNORMAL LOW (ref 3.87–5.11)
RDW: 15 % (ref 11.5–15.5)
WBC: 5 10*3/uL (ref 4.0–10.5)

## 2014-11-06 LAB — GLUCOSE, CAPILLARY
GLUCOSE-CAPILLARY: 100 mg/dL — AB (ref 70–99)
Glucose-Capillary: 116 mg/dL — ABNORMAL HIGH (ref 70–99)

## 2014-11-06 LAB — HEMOGLOBIN A1C
HEMOGLOBIN A1C: 6.1 % — AB (ref <5.7)
MEAN PLASMA GLUCOSE: 128 mg/dL — AB (ref <117)

## 2014-11-06 MED ORDER — CIPROFLOXACIN HCL 250 MG PO TABS: 250.0000 mg | ORAL_TABLET | Freq: Two times a day (BID) | ORAL | Status: AC

## 2014-11-06 NOTE — Progress Notes (Signed)
Called report to Jordan at [facility].

## 2014-11-06 NOTE — Progress Notes (Signed)
Utilization review completed.  

## 2014-11-06 NOTE — Progress Notes (Signed)
Pt back to [facility] by [facility] via stretcher.  VSS. Denies pain. Pleasant.

## 2014-11-06 NOTE — Clinical Social Work Psychosocial (Signed)
Clinical Social Work Department BRIEF PSYCHOSOCIAL ASSESSMENT 11/06/2014  Patient:  KITZIE, KEEBLER     Account Number:  [id number]     Admit date:  11/05/2014  Clinical Social Worker:  Arrie Aran,  Date/Time:  11/06/2014 12:37 PM  Referred by:  Physician  Date Referred:  11/06/2014 Referred for  SNF Placement   Other Referral:   Interview type:   Other interview type:    PSYCHOSOCIAL DATA Living Status:  FACILITY Admitted from facility:  [facility] Level of care:  Skilled Nursing Facility Primary support name:  Alan Mulder Primary support relationship to patient:  CHILD, ADULT Degree of support available:   Alan Mulder  [phone]    CURRENT CONCERNS  Other Concerns:    SOCIAL WORK ASSESSMENT / PLAN CSW Intern spoke with patient and son Leota  at bedside.Patient is from [facility] and confirmed she will be returning there. CSW-Intern informed Ms.Burroughs and her son that she is ready for discharge today. Patient was informed that she will be transported to facility by ambulance.   Assessment/plan status:  No Further Intervention Required Other assessment/ plan:   Information/referral to community resources:   None needed or requested at this time.    PATIENT'S/FAMILY'S RESPONSE TO PLAN OF CARE: Patient and her son was pleasant and agreeable.    Arrie Aran, CSW-Intern

## 2014-11-06 NOTE — Evaluation (Signed)
Physical Therapy Evaluation Patient Details Name: [name] MRN: [id number] DOB: Aug 24, 1936 Today's Date: 11/06/2014   History of Present Illness  [name] is a 78 y.o. female with past medical history of diabetes mellitus, HTN and chronic systolic CHF, brought into the hospital because of altered mental status. Patient lives in [facility], she was noticed by staff earlier today she is less reactive and nonverbal. She was brought to the hospital for further evaluation.  Clinical Impression  Pt admitted with the above. Pt currently with functional limitations due to the deficits listed below (see PT Problem List). At the time of PT eval pt was able to transition to/from EOB with max assist. At baseline staff uses a lift for bed<>chair. Pt will benefit from skilled PT to increase their independence and safety with mobility to allow discharge to the venue listed below.       Follow Up Recommendations SNF;Supervision/Assistance - 24 hour    Equipment Recommendations  None recommended by PT    Recommendations for Other Services       Precautions / Restrictions Precautions Precautions: None Restrictions Weight Bearing Restrictions: No      Mobility  Bed Mobility Overal bed mobility: Needs Assistance Bed Mobility: Rolling;Supine to Sit;Sit to Supine Rolling: Min assist;Mod assist   Supine to sit: Max assist Sit to supine: Max assist   General bed mobility comments: Pt required assist to roll far enough over to remove bed pan and perform peri-care. Pt had difficulty with the concept of sidelying to sit, and required max assist for supine<>sit. +2 was required to scoot pt up to [facility].   Transfers                 General transfer comment: Did not attempt transfers as no +2 was available for safety.   Ambulation/Gait                Stairs            Wheelchair Mobility    Modified Rankin (Stroke Patients Only)       Balance Overall  balance assessment: Needs assistance Sitting-balance support: Feet supported;No upper extremity supported Sitting balance-Leahy Scale: Fair Sitting balance - Comments: Pt sat EOB ~15 minutes. VC's for hand placement when trying to scoot hips around to adjust sitting postion on EOB.                                      Pertinent Vitals/Pain Pain Assessment: No/denies pain    Home Living Family/patient expects to be discharged to:: Skilled nursing facility                 Additional Comments: Pt. resident of [facility]    Prior Function Level of Independence: Needs assistance   Gait / Transfers Assistance Needed: Pt performs transfers with the lift at baseline  ADL's / Homemaking Assistance Needed: Dependent  Comments: Pt with significant vision deficits. Can only see outlines/figures of the people standing next to her.      Hand Dominance   Dominant Hand: Right    Extremity/Trunk Assessment   Upper Extremity Assessment: Generalized weakness           Lower Extremity Assessment: Generalized weakness      Cervical / Trunk Assessment: Kyphotic  Communication   Communication: No difficulties  Cognition Arousal/Alertness: Awake/alert Behavior During Therapy: WFL for tasks assessed/performed  Overall Cognitive Status: Within Functional Limits for tasks assessed                      General Comments      Exercises        Assessment/Plan    PT Assessment Patient needs continued PT services  PT Diagnosis Difficulty walking;Generalized weakness   PT Problem List Decreased strength;Decreased range of motion;Decreased balance;Decreased activity tolerance;Decreased mobility;Decreased knowledge of use of DME;Decreased knowledge of precautions;Decreased safety awareness  PT Treatment Interventions DME instruction;Gait training;Stair training;Functional mobility training;Therapeutic activities;Therapeutic exercise;Neuromuscular  re-education;Patient/family education   PT Goals (Current goals can be found in the Care Plan section) Acute Rehab PT Goals Patient Stated Goal: [facility] Goal Formulation: With patient Time For Goal Achievement: 11/13/14 Potential to Achieve Goals: Fair    Frequency Min 2X/week   Barriers to discharge        Co-evaluation               End of Session   Activity Tolerance: Patient tolerated treatment well Patient left: in bed;with call bell/phone within reach;with nursing/sitter in room;with family/visitor present Nurse Communication: Mobility status         Time: [id number] PT Time Calculation (min) (ACUTE ONLY): 23 min   Charges:   PT Evaluation $Initial PT Evaluation Tier I: 1 Procedure PT Treatments $Therapeutic Activity: 8-22 mins   PT G Codes:          Conni SlipperKirkman, Ante Arredondo 11/06/2014, 12:54 PM   Conni SlipperLaura Racer Quam, PT, DPT Acute Rehabilitation Services Pager: [phone]

## 2014-11-06 NOTE — Evaluation (Signed)
Occupational Therapy Evaluation Patient Details Name: [name] Overley MRN: [id number] DOB: November 10, 1936 Today's Date: 11/06/2014    History of Present Illness [name] Mallen is a 78 y.o. female with past medical history of diabetes mellitus, HTN and chronic systolic CHF, brought into the hospital because of altered mental status. [facility], she was noticed by staff earlier today she is less reactive and nonverbal. She was brought to the hospital for further evaluation.   Clinical Impression   Pt. Is a long term resident of [facility] and plans to d/c there. Pt. Is at baseline dep for ADLs and mobility. Pt. Does not require further skilled OT services at this time.     Follow Up Recommendations  No OT follow up    Equipment Recommendations       Recommendations for Other Services       Precautions / Restrictions Precautions Precautions: None Restrictions Weight Bearing Restrictions: No      Mobility Bed Mobility                  Transfers                      Balance                                            ADL Overall ADL's : At baseline                                       General ADL Comments:  (Pt. dep with ADLs secondary to weakness and low vison.)     Vision                     Perception     Praxis      Pertinent Vitals/Pain Pain Assessment: No/denies pain     Hand Dominance Right   Extremity/Trunk Assessment Upper Extremity Assessment Upper Extremity Assessment: Generalized weakness           Communication Communication Communication: No difficulties   Cognition Arousal/Alertness: Awake/alert Behavior During Therapy: WFL for tasks assessed/performed Overall Cognitive Status: Within Functional Limits for tasks assessed                     General Comments       Exercises       Shoulder Instructions      Home Living  Family/patient expects to be discharged to:: Skilled nursing facility                                 Additional Comments:  (Pt. resident of [facility])      Prior Functioning/Environment Level of Independence: Needs assistance  Gait / Transfers Assistance Needed:  (hoyer transfers) ADL's / Homemaking Assistance Needed:  (Pt. dep with all ADLs secondary to B UEdecreased functional )        OT Diagnosis:     OT Problem List:     OT Treatment/Interventions:      OT Goals(Current goals can be found in the care plan section) Acute Rehab OT Goals Patient Stated Goal:  (go back to [facility])  OT Frequency:  Barriers to D/C:            Co-evaluation              End of Session Nurse Communication: Need for lift equipment  Activity Tolerance: Patient tolerated treatment well Patient left: in bed;with call bell/phone within reach   Time: 0832-0912 OT Time Calculation (min): 40 min Charges:  OT General Charges $OT Visit: 1 Procedure OT Evaluation $Initial OT Evaluation Tier I: 1 Procedure OT Treatments $Self Care/Home Management : 23-37 mins G-Codes:    Moua Rasmusson 16-Nov-2014, 9:11 AM

## 2014-11-06 NOTE — Discharge Summary (Signed)
Discharge Summary  [name] ZOX:[id number] DOB: 01/29/1936  PCP: Georgann HousekeeperHUSAIN,KARRAR, MD  Admit date: 11/05/2014 Discharge date: 11/06/2014  Time spent: 25 minutes  Recommendations for Outpatient Follow-up:  1. Patient is returning back to her skilled nursing facility 2. New medication: Cipro 250 mg by mouth twice a day 3 days   Discharge Diagnoses:  Active Hospital Problems   Diagnosis Date Noted  . UTI (lower urinary tract infection) 09/10/2012  . Acute encephalopathy 11/05/2014  . Diabetes 10/04/2014  . Stage III chronic kidney disease 01/10/2007  . Essential hypertension 12/27/2006  . Chronic diastolic heart failure 12/27/2006    Resolved Hospital Problems   Diagnosis Date Noted Date Resolved  No resolved problems to display.    Discharge Condition: improved, being discharged back to skilled nursing  Diet recommendation: heart healthy carb modified  There were no vitals filed for this visit.  History of present illness:  78 year old female past mental history of diabetes mellitus, hypertension and chronic diastolic heart failure who resides in a skilled nursing facility was admitted on 11/16 for acute encephalopathy had started that day. In the emergency room, patient received IV fluids and found to have a moderate UTI. Started on IV antibiotics. She has started to show some improvement and became more alert.  Hospital Course:  Principal Problem:   UTI (lower urinary tract infection): Patient started on IV Rocephin. Remained afebrile and is improved. Urine cultures not yet back, however previous urine culture from over a year ago was Escherichia coli pansensitive. Patient will be discharged on 3 more days of by mouth Cipro Active Problems:   Essential hypertension: Stable, patient was continued on home medications.  Chronic diastolic heart failure:: Stable. Patient only given gentle IV fluids which were discontinued following day to make sure she was not volume  overloaded.    Stage III chronic kidney disease: creatinine stable, at baseline. On day of discharge, creatinine 1.46.  Diabetes mellitus controlled with renal dysfunction: CBG stable during this hospitalization.    Acute encephalopathy: Felt to be secondary to UTI, now resolved. By the following day, patient is very interactive, appropriate  Chronic anemia secondary to stage III chronic kidney disease: Stable. Hemoglobin on day of discharge at 10.4   Procedures:  none  Consultations:  none  Discharge Exam: BP 105/40 mmHg  Pulse 69  Temp(Src) 98 F (36.7 C) (Oral)  Resp 19  SpO2 99%  General: alert and oriented 2, no acute distress Cardiovascular: regular rate and rhythm, S1-S2 Respiratory: clear to auscultation bilaterally  Discharge Instructions You were cared for by a hospitalist during your hospital stay. If you have any questions about your discharge medications or the care you received while you were in the hospital after you are discharged, you can call the unit and asked to speak with the hospitalist on call if the hospitalist that took care of you is not available. Once you are discharged, your primary care physician will handle any further medical issues. Please note that NO REFILLS for any discharge medications will be authorized once you are discharged, as it is imperative that you return to your primary care physician (or establish a relationship with a primary care physician if you do not have one) for your aftercare needs so that they can reassess your need for medications and monitor your lab values.  Discharge Instructions    Diet - low sodium heart healthy    Complete by:  As directed      Increase activity slowly  Complete by:  As directed             Medication List    STOP taking these medications        aspirin EC 81 MG tablet     LORazepam 0.5 MG tablet  Commonly known as:  ATIVAN      TAKE these medications        allopurinol 100 MG  tablet  Commonly known as:  ZYLOPRIM  Take 200 mg by mouth daily.     amLODipine 5 MG tablet  Commonly known as:  NORVASC  Take 5 mg by mouth daily.     atorvastatin 20 MG tablet  Commonly known as:  LIPITOR  Take 1 tablet (20 mg total) by mouth daily.     brimonidine 0.2 % ophthalmic solution  Commonly known as:  ALPHAGAN  Place 1 drop into both eyes daily.     carvedilol 25 MG tablet  Commonly known as:  COREG  Take 1 tablet (25 mg total) by mouth 2 (two) times daily with a meal.     ciprofloxacin 250 MG tablet  Commonly known as:  CIPRO  Take 1 tablet (250 mg total) by mouth 2 (two) times daily.     DSS 100 MG Caps  Take 100 mg by mouth 2 (two) times daily.     ergocalciferol 50000 UNITS capsule  Commonly known as:  VITAMIN D2  Take 50,000 Units by mouth once a week. No specific day     furosemide 20 MG tablet  Commonly known as:  LASIX  Take 20 mg by mouth 2 (two) times daily.     glucose blood test strip  1 each by Other route 2 (two) times daily.     hydrALAZINE 50 MG tablet  Commonly known as:  APRESOLINE  Take 50 mg by mouth 3 (three) times daily.     insulin glargine 100 UNIT/ML injection  Commonly known as:  LANTUS  Inject 15 Units into the skin daily.     QUEtiapine 50 MG tablet  Commonly known as:  SEROQUEL  Take 50 mg by mouth at bedtime.     sennosides-docusate sodium 8.6-50 MG tablet  Commonly known as:  SENOKOT-S  Take 2 tablets by mouth daily.       Allergies  Allergen Reactions  . Lisinopril     REACTION: cough      The results of significant diagnostics from this hospitalization (including imaging, microbiology, ancillary and laboratory) are listed below for reference.    Significant Diagnostic Studies: Ct Head Wo Contrast  11/05/2014   CLINICAL DATA:  Found unresponsive at nursing home.  EXAM: CT HEAD WITHOUT CONTRAST  TECHNIQUE: Contiguous axial images were obtained from the base of the skull through the vertex without  intravenous contrast.  COMPARISON:  09/12/2012.  FINDINGS: No evidence of an acute infarct, acute hemorrhage, mass lesion, mass effect or hydrocephalus. Mild atrophy. Mild periventricular low attenuation. There may be a tiny remote lacunar infarct in the left basal ganglia. Visualized portions of the paranasal sinuses and mastoid air cells are clear.  IMPRESSION: 1. No acute intracranial abnormality. 2. Mild atrophy and mild chronic microvascular white matter ischemic changes.   Electronically Signed   By: Leanna Battles M.D.   On: 11/05/2014 11:14    Microbiology: No results found for this or any previous visit (from the past 240 hour(s)).   Labs: Basic Metabolic Panel:  Recent Labs Lab 11/05/14 1020 11/06/14 0715  NA 139 140  K 4.3 3.8  CL 100 103  CO2 23 23  GLUCOSE 105* 106*  BUN 29* 26*  CREATININE 1.56* 1.46*  CALCIUM 9.1 8.9   Liver Function Tests:  Recent Labs Lab 11/05/14 1020  AST 23  ALT 22  ALKPHOS 123*  BILITOT 0.3  PROT 7.5  ALBUMIN 2.9*   No results for input(s): LIPASE, AMYLASE in the last 168 hours. No results for input(s): AMMONIA in the last 168 hours. CBC:  Recent Labs Lab 11/05/14 1020 11/06/14 0715  WBC 6.1 5.0  HGB 11.4* 10.4*  HCT 35.9* 31.8*  MCV 95.5 96.7  PLT 219 218   Cardiac Enzymes:  Recent Labs Lab 11/05/14 1020  TROPONINI <0.30   BNP: BNP (last 3 results) No results for input(s): PROBNP in the last 8760 hours. CBG:  Recent Labs Lab 11/05/14 1007 11/05/14 1700 11/05/14 2127 11/06/14 0722  GLUCAP 98 98 106* 116*       Signed:  Lelynd Poer K  [facility] 11/06/2014, 11:03 AM

## 2014-11-09 LAB — URINE CULTURE: Colony Count: >=100000

## 2015-03-05 ENCOUNTER — Emergency Department (HOSPITAL_COMMUNITY)
Admission: EM | Admit: 2015-03-05 | Discharge: 2015-03-05 | Disposition: A | Discharge status: Home / Self Care | Payer: Medicare Other | Attending: Emergency Medicine | Admitting: Emergency Medicine

## 2015-03-05 ENCOUNTER — Encounter (HOSPITAL_COMMUNITY): Payer: Self-pay | Admitting: Emergency Medicine

## 2015-03-05 DIAGNOSIS — R4182 Altered mental status, unspecified: Secondary | ICD-10-CM | POA: Diagnosis present

## 2015-03-05 DIAGNOSIS — I129 Hypertensive chronic kidney disease with stage 1 through stage 4 chronic kidney disease, or unspecified chronic kidney disease: Secondary | ICD-10-CM | POA: Insufficient documentation

## 2015-03-05 DIAGNOSIS — M1990 Unspecified osteoarthritis, unspecified site: Secondary | ICD-10-CM | POA: Diagnosis not present

## 2015-03-05 DIAGNOSIS — H5441 Blindness, right eye, normal vision left eye: Secondary | ICD-10-CM | POA: Diagnosis not present

## 2015-03-05 DIAGNOSIS — I2510 Atherosclerotic heart disease of native coronary artery without angina pectoris: Secondary | ICD-10-CM | POA: Insufficient documentation

## 2015-03-05 DIAGNOSIS — I5020 Unspecified systolic (congestive) heart failure: Secondary | ICD-10-CM | POA: Insufficient documentation

## 2015-03-05 DIAGNOSIS — I5032 Chronic diastolic (congestive) heart failure: Secondary | ICD-10-CM | POA: Diagnosis not present

## 2015-03-05 DIAGNOSIS — Z79899 Other long term (current) drug therapy: Secondary | ICD-10-CM | POA: Insufficient documentation

## 2015-03-05 DIAGNOSIS — F4321 Adjustment disorder with depressed mood: Secondary | ICD-10-CM | POA: Diagnosis not present

## 2015-03-05 DIAGNOSIS — E785 Hyperlipidemia, unspecified: Secondary | ICD-10-CM | POA: Diagnosis not present

## 2015-03-05 DIAGNOSIS — Z9861 Coronary angioplasty status: Secondary | ICD-10-CM | POA: Insufficient documentation

## 2015-03-05 DIAGNOSIS — R609 Edema, unspecified: Secondary | ICD-10-CM | POA: Insufficient documentation

## 2015-03-05 DIAGNOSIS — F419 Anxiety disorder, unspecified: Secondary | ICD-10-CM | POA: Insufficient documentation

## 2015-03-05 DIAGNOSIS — Z8669 Personal history of other diseases of the nervous system and sense organs: Secondary | ICD-10-CM | POA: Diagnosis not present

## 2015-03-05 DIAGNOSIS — E11359 Type 2 diabetes mellitus with proliferative diabetic retinopathy without macular edema: Secondary | ICD-10-CM | POA: Insufficient documentation

## 2015-03-05 DIAGNOSIS — N390 Urinary tract infection, site not specified: Secondary | ICD-10-CM

## 2015-03-05 DIAGNOSIS — K219 Gastro-esophageal reflux disease without esophagitis: Secondary | ICD-10-CM | POA: Diagnosis not present

## 2015-03-05 DIAGNOSIS — N183 Chronic kidney disease, stage 3 (moderate): Secondary | ICD-10-CM | POA: Insufficient documentation

## 2015-03-05 DIAGNOSIS — Z794 Long term (current) use of insulin: Secondary | ICD-10-CM | POA: Diagnosis not present

## 2015-03-05 DIAGNOSIS — H409 Unspecified glaucoma: Secondary | ICD-10-CM | POA: Diagnosis not present

## 2015-03-05 LAB — CBC WITH DIFFERENTIAL/PLATELET
Basophils Absolute: 0 10*3/uL (ref 0.0–0.1)
Basophils Relative: 0 % (ref 0–1)
EOS ABS: 0.2 10*3/uL (ref 0.0–0.7)
Eosinophils Relative: 3 % (ref 0–5)
HEMATOCRIT: 36.4 % (ref 36.0–46.0)
Hemoglobin: 11.9 g/dL — ABNORMAL LOW (ref 12.0–15.0)
LYMPHS ABS: 2.3 10*3/uL (ref 0.7–4.0)
LYMPHS PCT: 39 % (ref 12–46)
MCH: 31.8 pg (ref 26.0–34.0)
MCHC: 32.7 g/dL (ref 30.0–36.0)
MCV: 97.3 fL (ref 78.0–100.0)
MONOS PCT: 9 % (ref 3–12)
Monocytes Absolute: 0.5 10*3/uL (ref 0.1–1.0)
NEUTROS PCT: 49 % (ref 43–77)
Neutro Abs: 2.9 10*3/uL (ref 1.7–7.7)
PLATELETS: 247 10*3/uL (ref 150–400)
RBC: 3.74 MIL/uL — AB (ref 3.87–5.11)
RDW: 14.9 % (ref 11.5–15.5)
WBC: 5.9 10*3/uL (ref 4.0–10.5)

## 2015-03-05 LAB — COMPREHENSIVE METABOLIC PANEL
ALK PHOS: 88 U/L (ref 39–117)
ALT: 12 U/L (ref 0–35)
ANION GAP: 5 (ref 5–15)
AST: 18 U/L (ref 0–37)
Albumin: 3.6 g/dL (ref 3.5–5.2)
BILIRUBIN TOTAL: 0.4 mg/dL (ref 0.3–1.2)
BUN: 36 mg/dL — AB (ref 6–23)
CO2: 30 mmol/L (ref 19–32)
Calcium: 9.2 mg/dL (ref 8.4–10.5)
Chloride: 104 mmol/L (ref 96–112)
Creatinine, Ser: 1.66 mg/dL — ABNORMAL HIGH (ref 0.50–1.10)
GFR calc non Af Amer: 29 mL/min — ABNORMAL LOW (ref >90)
GFR, EST AFRICAN AMERICAN: 33 mL/min — AB (ref >90)
GLUCOSE: 124 mg/dL — AB (ref 70–99)
POTASSIUM: 3.8 mmol/L (ref 3.5–5.1)
Sodium: 139 mmol/L (ref 135–145)
Total Protein: 7.4 g/dL (ref 6.0–8.3)

## 2015-03-05 LAB — URINALYSIS, ROUTINE W REFLEX MICROSCOPIC
Bilirubin Urine: NEGATIVE
GLUCOSE, UA: NEGATIVE mg/dL
Hgb urine dipstick: NEGATIVE
Ketones, ur: NEGATIVE mg/dL
NITRITE: POSITIVE — AB
PROTEIN: NEGATIVE mg/dL
SPECIFIC GRAVITY, URINE: 1.009 (ref 1.005–1.030)
Urobilinogen, UA: 0.2 mg/dL (ref 0.0–1.0)
pH: 6.5 (ref 5.0–8.0)

## 2015-03-05 LAB — URINE MICROSCOPIC-ADD ON

## 2015-03-05 MED ORDER — CEPHALEXIN 500 MG PO CAPS
500.0000 mg | ORAL_CAPSULE | Freq: Once | ORAL | Status: AC
  Administered 2015-03-05: 500 mg via ORAL
  Filled 2015-03-05: qty 1

## 2015-03-05 MED ORDER — SODIUM CHLORIDE 0.9 % IV SOLN
1000.0000 mL | INTRAVENOUS | Status: DC
  Administered 2015-03-05: 1000 mL via INTRAVENOUS

## 2015-03-05 MED ORDER — CEPHALEXIN 500 MG PO CAPS: 500.0000 mg | ORAL_CAPSULE | Freq: Four times a day (QID) | ORAL | Status: DC

## 2015-03-05 NOTE — ED Provider Notes (Signed)
CSN: [id number]     Arrival date & time 03/05/15  1638 History   First MD Initiated Contact with Patient 03/05/15 1649     Chief Complaint  Patient presents with  . Altered Mental Status   HPI Patient presents to the emergency room because of decreased responsiveness.  The patient is a resident of [facility]. Family went to visit her today and the patient would not speak or communicate with them. Staff at the nursing facility stated that she had been that way for at least the previous hour. Patient at her baseline is confused but will speak and communicate with her family members. She also has decreased mobility and primarily uses a wheelchair.  She has not had any fevers recently. No known nausea or vomiting. No known injuries. Initially during my evaluation the patient would not answer questions, as I was assessing her for her responsiveness she suddenly began speaking. Patient states she was upset. [place] that no one will take care of her in the she has insurance. Her son reassured her to the patient does have medical insurance. Her only complaint is some burning when she urinates. She complains of some irritation in the skin on her lower back. No chest pain or abdominal pain. No fevers or vomiting. No headache. Past Medical History  Diagnosis Date  . Coronary artery disease     LHC 05/25/06: No renal artery stenosis, proximal LAD 40%, proximal D1 40%  . Diabetes mellitus     a1c 7.3 in 10/2011  . Hypertension   . Systolic heart failure     Echo 10/09: EF 30-35%. Echo 2/13: EF 50-55%.  Marland Kitchen NICM (nonischemic cardiomyopathy)   . Blood transfusion without reported diagnosis   . Anxiety   . Arthritis   . Chronic diastolic CHF (congestive heart failure)   . GLAUCOMA 12/06/2008    Vision Loss right eye   . 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    . Visual hallucinations 07/15/2011  . Stage III chronic kidney disease  01/10/2007  . OSA (obstructive sleep apnea) 03/04/2012  . HYPERLIPIDEMIA 12/27/2006  . GERD (gastroesophageal reflux disease)    Past Surgical History  Procedure Laterality Date  . Coronary angioplasty with stent placement    . Eye surgery  2012    both eyes   Family History  Problem Relation Age of Onset  . Heart disease Maternal Grandmother   . Prostate cancer Father   . Colon cancer Maternal Aunt    History  Substance Use Topics  . Smoking status: Never Smoker   . Smokeless tobacco: Never Used     Comment: does have second hand smoke exposure  . Alcohol Use: No   OB History    No data available     Review of Systems  All other systems reviewed and are negative.     Allergies  Lisinopril  Home Medications   Prior to Admission medications   Medication Sig Start Date End Date Taking? Authorizing Provider  allopurinol (ZYLOPRIM) 100 MG tablet Take 200 mg by mouth daily.   Yes Historical Provider, MD  amLODipine (NORVASC) 5 MG tablet Take 5 mg by mouth daily.   Yes Historical Provider, MD  atorvastatin (LIPITOR) 20 MG tablet Take 1 tablet (20 mg total) by mouth daily. 12/30/12  Yes Ignacia Palma, MD  brimonidine (ALPHAGAN) 0.2 % ophthalmic solution Place 1 drop into both eyes daily.   Yes Historical Provider, MD  carvedilol (COREG) 25 MG tablet Take 1 tablet (25 mg total) by mouth 2 (two) times daily with a meal. 04/08/12  Yes Denna Haggard, MD  cephALEXin (KEFLEX) 500 MG capsule Take 1 capsule (500 mg total) by mouth 4 (four) times daily. 03/05/15   Linwood Dibbles, MD  docusate sodium 100 MG CAPS Take 100 mg by mouth 2 (two) times daily. 09/13/12  Yes Emory Nani Skillern, MD  ergocalciferol (VITAMIN D2) 50000 UNITS capsule Take 50,000 Units by mouth once a week. No specific day   Yes Historical Provider, MD  furosemide (LASIX) 20 MG tablet Take 20 mg by mouth 2 (two) times daily.   Yes Historical Provider, MD  hydrALAZINE (APRESOLINE) 50 MG tablet Take 50 mg by mouth 3 (three)  times daily.   Yes Historical Provider, MD  insulin glargine (LANTUS) 100 UNIT/ML injection Inject 15 Units into the skin daily. 12/30/12  Yes Ignacia Palma, MD  QUEtiapine (SEROQUEL) 25 MG tablet Take 25 mg by mouth 2 (two) times daily.   Yes Historical Provider, MD  sennosides-docusate sodium (SENOKOT-S) 8.6-50 MG tablet Take 2 tablets by mouth daily.   Yes Historical Provider, MD   BP 152/51 mmHg  Pulse 69  Temp(Src) 97.7 F (36.5 C) (Oral)  Resp 10  SpO2 99% Physical Exam  Constitutional: No distress.  HENT:  Head: Normocephalic and atraumatic.  Right Ear: External ear normal.  Left Ear: External ear normal.  Eyes: Conjunctivae are normal. Right eye exhibits no discharge. Left eye exhibits no discharge. No scleral icterus.  Irregular pupils, patient is blind in one eye  Neck: Neck supple. No tracheal deviation present.  Cardiovascular: Normal rate, regular rhythm and intact distal pulses.   Pulmonary/Chest: Effort normal and breath sounds normal. No stridor. No respiratory distress. She has no wheezes. She has no rales.  Abdominal: Soft. Bowel sounds are normal. She exhibits no distension. There is no tenderness. There is no rebound and no guarding.  Musculoskeletal: She exhibits edema (edema of her lower extremities). She exhibits no tenderness.  Neurological: She is alert. She displays no tremor. No cranial nerve deficit (no facial droop, extraocular movements intact, no slurred speech) or sensory deficit. She exhibits normal muscle tone. She displays no seizure activity. Coordination normal. GCS eye subscore is 4. GCS verbal subscore is 4. GCS motor subscore is 6.  The patient is aware of where she is, she knows who her son is, she is moving all extremities, she follows commands  Skin: Skin is warm and dry. No rash noted.  Psychiatric: She has a normal mood and affect.  Nursing note and vitals reviewed.   ED Course  Procedures (including critical care time) Labs Review Labs  Reviewed  CBC WITH DIFFERENTIAL/PLATELET - Abnormal; Notable for the following:    RBC 3.74 (*)    Hemoglobin 11.9 (*)    All other components within normal limits  COMPREHENSIVE METABOLIC PANEL - Abnormal; Notable for the following:    Glucose, Bld 124 (*)    BUN 36 (*)    Creatinine, Ser 1.66 (*)    GFR calc non Af Amer 29 (*)    GFR calc Af Amer 33 (*)    All other components within normal limits  URINALYSIS, ROUTINE W REFLEX MICROSCOPIC - Abnormal; Notable for the following:    APPearance CLOUDY (*)    Nitrite POSITIVE (*)    Leukocytes, UA LARGE (*)    All other components within normal limits  URINE MICROSCOPIC-ADD ON - Abnormal;  Notable for the following:    Bacteria, UA MANY (*)    All other components within normal limits   Medications  0.9 %  sodium chloride infusion (1,000 mLs Intravenous New Bag/Given 03/05/15 1852)  cephALEXin (KEFLEX) capsule 500 mg (not administered)     MDM   Final diagnoses:  UTI (lower urinary tract infection)  Adjustment disorder with depressed mood   I discussed the findings with the patient and her friend that was at the bedside.  Pt has a uti but her lack of communication earlier was because the patient was unhappy with being in the nursing home.  When I told the patient she would be return tonight she spoke up and asked several times not to be sent back.  She does not want to be there.  Family is planning on trying to transfer her now to another facility.    Linwood Dibbles, MD 03/05/15 2045

## 2015-03-05 NOTE — Progress Notes (Signed)
CSW met with pt at bedside. Friend was present. Friend states that the pt's daughter was present but recently left. According to pt's friend, the pt's daughter is her POA. Per note, pt presents to [facility] due to decreased responsiveness while at facility. Patient confirms that she is from [facility]. Friend appears to be a great support, and says that they have been knowing each other for many years.  Friend informed CSW that while visiting pt at [facility], she was not responsive and did not communicate with them. Friend informed CSW that this was unlike the pt and that she is usually "up and talking". Friend informed CSW that the pt is blind. Also, she states that the pt is wheelchair bound.  Pt appeared to be slightly irritated during the interview. She stated that she does not like [facility], and does not want to go back.    Friend states that pt currently has a UTI. Also, friend states that pt has not had any falls within the past 6 months.  Patient and friend do not have any questions at this time. CSW will give pt and her friend information for the ombudsman hotline.  Willette Brace [phone] ED CSW 03/05/2015 10:07 PM

## 2015-03-05 NOTE — Discharge Instructions (Signed)

## 2015-03-05 NOTE — ED Notes (Signed)
Bed: WA15 Expected date:  Expected time:  Means of arrival:  Comments: ems 

## 2015-03-05 NOTE — ED Notes (Signed)
Patient spontaneously opened eyes and began conversing lucidly.

## 2015-03-05 NOTE — ED Notes (Addendum)
Per EMs they were contacted by nursing facility, family at bedside x 3hours. Pt not responsive to family or staff at facility. SNF called facility M.D. , family request pt be transported to hospital. Pt able to follow commands but is not opening eyes, nonverbal, pt responding to sounds and pain. Family stated that nonverbal behavior is not new onset. Staff at facility reported urine appeared darker and odorous.

## 2015-03-05 NOTE — ED Notes (Signed)
Pt from [facility] sent by EMS due to difficulty arousing. Pt not responsive to voice or pain. Per family member pt's baseline is confused but alert and verbal.

## 2015-08-08 ENCOUNTER — Encounter (HOSPITAL_COMMUNITY): Payer: Medicare Other

## 2015-09-09 ENCOUNTER — Ambulatory Visit (HOSPITAL_COMMUNITY)
Admission: RE | Admit: 2015-09-09 | Discharge: 2015-09-09 | Disposition: A | Discharge status: Home / Self Care | Payer: Medicare Other | Source: Ambulatory Visit | Attending: Cardiology | Admitting: Cardiology

## 2015-09-09 ENCOUNTER — Encounter (HOSPITAL_COMMUNITY): Payer: Self-pay

## 2015-09-09 VITALS — BP 122/62 | HR 77 | Wt 223.0 lb

## 2015-09-09 DIAGNOSIS — E1122 Type 2 diabetes mellitus with diabetic chronic kidney disease: Secondary | ICD-10-CM | POA: Diagnosis not present

## 2015-09-09 DIAGNOSIS — Z79899 Other long term (current) drug therapy: Secondary | ICD-10-CM | POA: Diagnosis not present

## 2015-09-09 DIAGNOSIS — G4733 Obstructive sleep apnea (adult) (pediatric): Secondary | ICD-10-CM | POA: Insufficient documentation

## 2015-09-09 DIAGNOSIS — Z794 Long term (current) use of insulin: Secondary | ICD-10-CM | POA: Diagnosis not present

## 2015-09-09 DIAGNOSIS — N183 Chronic kidney disease, stage 3 (moderate): Secondary | ICD-10-CM | POA: Insufficient documentation

## 2015-09-09 DIAGNOSIS — K219 Gastro-esophageal reflux disease without esophagitis: Secondary | ICD-10-CM | POA: Insufficient documentation

## 2015-09-09 DIAGNOSIS — E785 Hyperlipidemia, unspecified: Secondary | ICD-10-CM | POA: Diagnosis not present

## 2015-09-09 DIAGNOSIS — I5032 Chronic diastolic (congestive) heart failure: Secondary | ICD-10-CM | POA: Diagnosis present

## 2015-09-09 DIAGNOSIS — I129 Hypertensive chronic kidney disease with stage 1 through stage 4 chronic kidney disease, or unspecified chronic kidney disease: Secondary | ICD-10-CM | POA: Diagnosis not present

## 2015-09-09 DIAGNOSIS — E11359 Type 2 diabetes mellitus with proliferative diabetic retinopathy without macular edema: Secondary | ICD-10-CM | POA: Insufficient documentation

## 2015-09-09 DIAGNOSIS — I428 Other cardiomyopathies: Secondary | ICD-10-CM | POA: Diagnosis not present

## 2015-09-09 DIAGNOSIS — I2510 Atherosclerotic heart disease of native coronary artery without angina pectoris: Secondary | ICD-10-CM | POA: Insufficient documentation

## 2015-09-09 LAB — BASIC METABOLIC PANEL
ANION GAP: 9 (ref 5–15)
BUN: 24 mg/dL — ABNORMAL HIGH (ref 6–20)
CO2: 27 mmol/L (ref 22–32)
Calcium: 9.4 mg/dL (ref 8.9–10.3)
Chloride: 107 mmol/L (ref 101–111)
Creatinine, Ser: 1.72 mg/dL — ABNORMAL HIGH (ref 0.44–1.00)
GFR calc Af Amer: 32 mL/min — ABNORMAL LOW (ref >60)
GFR, EST NON AFRICAN AMERICAN: 28 mL/min — AB (ref >60)
Glucose, Bld: 94 mg/dL (ref 65–99)
POTASSIUM: 4.3 mmol/L (ref 3.5–5.1)
SODIUM: 143 mmol/L (ref 135–145)

## 2015-09-09 LAB — LIPID PANEL
Cholesterol: 195 mg/dL (ref 0–200)
HDL: 38 mg/dL — AB (ref >40)
LDL Cholesterol: 126 mg/dL — ABNORMAL HIGH (ref 0–99)
TRIGLYCERIDES: 155 mg/dL — AB (ref <150)
Total CHOL/HDL Ratio: 5.1 RATIO
VLDL: 31 mg/dL (ref 0–40)

## 2015-09-09 LAB — CBC
HEMATOCRIT: 35.9 % — AB (ref 36.0–46.0)
Hemoglobin: 11.5 g/dL — ABNORMAL LOW (ref 12.0–15.0)
MCH: 30.8 pg (ref 26.0–34.0)
MCHC: 32 g/dL (ref 30.0–36.0)
MCV: 96.2 fL (ref 78.0–100.0)
Platelets: 214 10*3/uL (ref 150–400)
RBC: 3.73 MIL/uL — ABNORMAL LOW (ref 3.87–5.11)
RDW: 15.3 % (ref 11.5–15.5)
WBC: 5.6 10*3/uL (ref 4.0–10.5)

## 2015-09-09 NOTE — Patient Instructions (Signed)
We will contact you in 1 year to schedule your next appointment.  

## 2015-09-09 NOTE — Progress Notes (Signed)
Advanced HF Clinic Note  Patient ID: [name], female   DOB: [birth year], 79 y.o.   MRN: [id number]  Referring Physician: Dr. [name] Primary Care: Dr. Dorise Hiss Primary Cardiologist: Dr. [name]  HF: Dr Gala Romney  HPI: [name] is a 79 y.o. African American female with history of NICM, EF 30-35% 2009 with repeat EF 50-55% 01/2012, minimal nonobstructive coronary artery disease, HTN, hyperlipidemia, diabetes, diabetic retinopathy with residual blindness in left eye and loss of most sight in right.    LHC 05/25/06: No renal artery stenosis, proximal LAD 40%, proximal D1 40%  Admitted for acute on chronic diastolic dysfunction 2013.  Diuresed with IV lasix. Labs 01/19/12: Potassium 4.5, BUN 22, creatinine 1.30, BNP 860, hemoglobin 12.9. Chest x-ray at that time was negative for edema.   Last echo 2/15: EF  55% to 60%. Mild LVD, Normal RV.  She returns for follow up. Last seen in clinic Feb 2015. Currently living at [facility]. No weights available .  Unable to stand for weights but states it was 223 on 9/16, after accounting for the wheelchair. Says swelling goes up sometimes, but comes down with lasix. Did not take her medication this morning so she wouldn't have to use the restroom during visit. Denies SOB/PND/Orthopnea. She denies any chest pain.  Does have occasional indigestion and UTIs.    Past Medical History  Diagnosis Date  . Coronary artery disease     LHC 05/25/06: No renal artery stenosis, proximal LAD 40%, proximal D1 40%  . Diabetes mellitus     a1c 7.3 in 10/2011  . Hypertension   . Systolic heart failure     Echo 10/09: EF 30-35%. Echo 2/13: EF 50-55%.  Marland Kitchen NICM (nonischemic cardiomyopathy)   . Blood transfusion without reported diagnosis   . Anxiety   . Arthritis   . Chronic diastolic CHF (congestive heart failure)   . GLAUCOMA 12/06/2008    Vision Loss right eye   . Proliferative diabetic retinopathy 01/28/2009    Severe Disease with Near Complete  Blindness.    . Visual hallucinations 07/15/2011  . Stage III chronic kidney disease 01/10/2007  . OSA (obstructive sleep apnea) 03/04/2012  . HYPERLIPIDEMIA 12/27/2006  . GERD (gastroesophageal reflux disease)     Current Outpatient Prescriptions  Medication Sig Dispense Refill  . allopurinol (ZYLOPRIM) 100 MG tablet Take 200 mg by mouth daily.    Marland Kitchen amLODipine (NORVASC) 5 MG tablet Take 5 mg by mouth daily.    Marland Kitchen atorvastatin (LIPITOR) 20 MG tablet Take 1 tablet (20 mg total) by mouth daily. 30 tablet 1  . brimonidine (ALPHAGAN) 0.2 % ophthalmic solution Place 1 drop into both eyes daily.    . carvedilol (COREG) 25 MG tablet Take 1 tablet (25 mg total) by mouth 2 (two) times daily with a meal. 60 tablet 11  . cephALEXin (KEFLEX) 500 MG capsule Take 1 capsule (500 mg total) by mouth 4 (four) times daily. 28 capsule 0  . docusate sodium 100 MG CAPS Take 100 mg by mouth 2 (two) times daily. 10 capsule 0  . ergocalciferol (VITAMIN D2) 50000 UNITS capsule Take 50,000 Units by mouth once a week. No specific day    . furosemide (LASIX) 20 MG tablet Take 20 mg by mouth 2 (two) times daily.    . hydrALAZINE (APRESOLINE) 50 MG tablet Take 50 mg by mouth 3 (three) times daily.    . insulin glargine (LANTUS) 100 UNIT/ML injection Inject 15 Units into the skin daily.    Marland Kitchen  QUEtiapine (SEROQUEL) 25 MG tablet Take 25 mg by mouth 2 (two) times daily.    . sennosides-docusate sodium (SENOKOT-S) 8.6-50 MG tablet Take 2 tablets by mouth daily.     No current facility-administered medications for this encounter.    Allergies  Allergen Reactions  . Lisinopril     REACTION: cough    Social History   Social History  . Marital Status: Widowed    Spouse Name: N/A  . Number of Children: 5  . Years of Education: RN school   Occupational History  . Retired     formerly worked as a Chief Strategy Officer   Social History Main Topics  . Smoking status: Never Smoker   . Smokeless tobacco: Never Used     Comment:  does have second hand smoke exposure  . Alcohol Use: No  . Drug Use: No  . Sexual Activity: No   Other Topics Concern  . Not on file   Social History Narrative   Lives with son who helps with her medications.    Family History  Problem Relation Age of Onset  . Heart disease Maternal Grandmother   . Prostate cancer Father   . Colon cancer Maternal Aunt     PHYSICAL EXAM: Filed Vitals:   09/09/15 1138  BP: 122/62  Pulse: 77  Weight: 223 lb (101.152 kg)  SpO2: 98%    General:  Obese, sitting in WC.  Legally blind.  NAD HEENT: normal except for blindness.  Neck: supple. no JVD. Carotids 2+ bilat; no bruits. No lymphadenopathy or thyromegaly. Cor: PMI nondisplaced. Regular rate & rhythm. Soft TR. Lungs: CTA Abdomen: soft, ND, ND. No hepatosplenomegaly. No bruits or masses. +BS Extremities: no cyanosis, clubbing, rash, Trace ankle edema Neuro: alert & oriented x 3, cranial nerves grossly intact. moves all 4 extremities w/o difficulty. Affect pleasant.  ASSESSMENT & PLAN:  1. Chronic Diastolic Heart Failure, Echo 01/2014 EF 55-60%   - Volume status stable. Continue lasix 20 mg twice a day.  - Continue to take an additional 20 mg lasix if her weight is up 3 pounds in 24 hours.  - Stable on current medications. 2. Chest Pain  - No further chest pain. - Will get EKG today.  Follow up in 1 year or as needed. BMET, CBC, Lipids and EKG today.  Graciella Freer PA-C 11:41 AM  Patient seen with PA, agree with the above note. She is stable clinically.  She is not volume overloaded.  Would continue current Lasix.   ECG shows NSR with nonspecific T wave flattening  Will get labs today as above.   Followup in 1 year unless there are clinical changes.   Marca Ancona 09/09/2015

## 2015-09-16 ENCOUNTER — Telehealth (HOSPITAL_COMMUNITY): Payer: Self-pay | Admitting: *Deleted

## 2015-09-16 NOTE — Telephone Encounter (Signed)
Attempted to call pt with lab results but both numbers listed for pt are not in service.  Will mail letter.

## 2015-09-16 NOTE — Telephone Encounter (Signed)
-----   Message from Laurey Morale, MD sent at 09/13/2015 10:16 AM EDT ----- LDL is high, would increase atorvastatin to 40 with lipids/LFTs in 2 months.  Stable creatinine.

## 2015-09-17 ENCOUNTER — Encounter (HOSPITAL_COMMUNITY): Payer: Self-pay | Admitting: *Deleted

## 2016-09-18 ENCOUNTER — Emergency Department (HOSPITAL_COMMUNITY): Payer: Medicare Other

## 2016-09-18 ENCOUNTER — Inpatient Hospital Stay (HOSPITAL_COMMUNITY): Payer: Medicare Other

## 2016-09-18 ENCOUNTER — Encounter (HOSPITAL_COMMUNITY): Payer: Self-pay | Admitting: Nurse Practitioner

## 2016-09-18 ENCOUNTER — Inpatient Hospital Stay (HOSPITAL_COMMUNITY)
Admission: EM | Admit: 2016-09-18 | Discharge: 2016-10-01 | DRG: 682 | Disposition: A | Discharge status: Skilled Nursing Facility | Payer: Medicare Other | Attending: Family Medicine | Admitting: Family Medicine

## 2016-09-18 DIAGNOSIS — D631 Anemia in chronic kidney disease: Secondary | ICD-10-CM | POA: Diagnosis present

## 2016-09-18 DIAGNOSIS — T361X5A Adverse effect of cephalosporins and other beta-lactam antibiotics, initial encounter: Secondary | ICD-10-CM | POA: Diagnosis present

## 2016-09-18 DIAGNOSIS — G4733 Obstructive sleep apnea (adult) (pediatric): Secondary | ICD-10-CM | POA: Diagnosis present

## 2016-09-18 DIAGNOSIS — E113599 Type 2 diabetes mellitus with proliferative diabetic retinopathy without macular edema, unspecified eye: Secondary | ICD-10-CM | POA: Diagnosis present

## 2016-09-18 DIAGNOSIS — I5042 Chronic combined systolic (congestive) and diastolic (congestive) heart failure: Secondary | ICD-10-CM | POA: Diagnosis present

## 2016-09-18 DIAGNOSIS — K5900 Constipation, unspecified: Secondary | ICD-10-CM | POA: Diagnosis not present

## 2016-09-18 DIAGNOSIS — I5032 Chronic diastolic (congestive) heart failure: Secondary | ICD-10-CM

## 2016-09-18 DIAGNOSIS — D72829 Elevated white blood cell count, unspecified: Secondary | ICD-10-CM | POA: Diagnosis not present

## 2016-09-18 DIAGNOSIS — B3731 Acute candidiasis of vulva and vagina: Secondary | ICD-10-CM

## 2016-09-18 DIAGNOSIS — Z66 Do not resuscitate: Secondary | ICD-10-CM | POA: Diagnosis present

## 2016-09-18 DIAGNOSIS — I25119 Atherosclerotic heart disease of native coronary artery with unspecified angina pectoris: Secondary | ICD-10-CM | POA: Diagnosis not present

## 2016-09-18 DIAGNOSIS — H547 Unspecified visual loss: Secondary | ICD-10-CM

## 2016-09-18 DIAGNOSIS — H5440 Blindness, one eye, unspecified eye: Secondary | ICD-10-CM | POA: Diagnosis present

## 2016-09-18 DIAGNOSIS — R52 Pain, unspecified: Secondary | ICD-10-CM

## 2016-09-18 DIAGNOSIS — E876 Hypokalemia: Secondary | ICD-10-CM | POA: Diagnosis present

## 2016-09-18 DIAGNOSIS — E785 Hyperlipidemia, unspecified: Secondary | ICD-10-CM | POA: Diagnosis present

## 2016-09-18 DIAGNOSIS — I10 Essential (primary) hypertension: Secondary | ICD-10-CM | POA: Diagnosis not present

## 2016-09-18 DIAGNOSIS — I130 Hypertensive heart and chronic kidney disease with heart failure and stage 1 through stage 4 chronic kidney disease, or unspecified chronic kidney disease: Secondary | ICD-10-CM | POA: Diagnosis present

## 2016-09-18 DIAGNOSIS — A0472 Enterocolitis due to Clostridium difficile, not specified as recurrent: Secondary | ICD-10-CM | POA: Diagnosis present

## 2016-09-18 DIAGNOSIS — N183 Chronic kidney disease, stage 3 unspecified: Secondary | ICD-10-CM | POA: Diagnosis present

## 2016-09-18 DIAGNOSIS — Z79899 Other long term (current) drug therapy: Secondary | ICD-10-CM

## 2016-09-18 DIAGNOSIS — B373 Candidiasis of vulva and vagina: Secondary | ICD-10-CM

## 2016-09-18 DIAGNOSIS — Z6839 Body mass index (BMI) 39.0-39.9, adult: Secondary | ICD-10-CM

## 2016-09-18 DIAGNOSIS — A047 Enterocolitis due to Clostridium difficile: Secondary | ICD-10-CM | POA: Diagnosis not present

## 2016-09-18 DIAGNOSIS — Z794 Long term (current) use of insulin: Secondary | ICD-10-CM

## 2016-09-18 DIAGNOSIS — I428 Other cardiomyopathies: Secondary | ICD-10-CM | POA: Diagnosis present

## 2016-09-18 DIAGNOSIS — R627 Adult failure to thrive: Secondary | ICD-10-CM | POA: Diagnosis present

## 2016-09-18 DIAGNOSIS — Z8701 Personal history of pneumonia (recurrent): Secondary | ICD-10-CM

## 2016-09-18 DIAGNOSIS — K859 Acute pancreatitis without necrosis or infection, unspecified: Secondary | ICD-10-CM | POA: Diagnosis not present

## 2016-09-18 DIAGNOSIS — K853 Drug induced acute pancreatitis without necrosis or infection: Secondary | ICD-10-CM

## 2016-09-18 DIAGNOSIS — I2510 Atherosclerotic heart disease of native coronary artery without angina pectoris: Secondary | ICD-10-CM | POA: Diagnosis present

## 2016-09-18 DIAGNOSIS — N189 Chronic kidney disease, unspecified: Secondary | ICD-10-CM | POA: Diagnosis not present

## 2016-09-18 DIAGNOSIS — N179 Acute kidney failure, unspecified: Secondary | ICD-10-CM | POA: Diagnosis present

## 2016-09-18 DIAGNOSIS — H409 Unspecified glaucoma: Secondary | ICD-10-CM | POA: Diagnosis present

## 2016-09-18 DIAGNOSIS — R5381 Other malaise: Secondary | ICD-10-CM | POA: Diagnosis present

## 2016-09-18 DIAGNOSIS — R7989 Other specified abnormal findings of blood chemistry: Secondary | ICD-10-CM

## 2016-09-18 DIAGNOSIS — E1122 Type 2 diabetes mellitus with diabetic chronic kidney disease: Secondary | ICD-10-CM | POA: Diagnosis present

## 2016-09-18 DIAGNOSIS — E1121 Type 2 diabetes mellitus with diabetic nephropathy: Secondary | ICD-10-CM | POA: Diagnosis present

## 2016-09-18 DIAGNOSIS — T501X5A Adverse effect of loop [high-ceiling] diuretics, initial encounter: Secondary | ICD-10-CM | POA: Diagnosis present

## 2016-09-18 DIAGNOSIS — M6281 Muscle weakness (generalized): Secondary | ICD-10-CM | POA: Diagnosis not present

## 2016-09-18 DIAGNOSIS — E1139 Type 2 diabetes mellitus with other diabetic ophthalmic complication: Secondary | ICD-10-CM | POA: Diagnosis not present

## 2016-09-18 DIAGNOSIS — E860 Dehydration: Secondary | ICD-10-CM | POA: Diagnosis present

## 2016-09-18 DIAGNOSIS — H540 Blindness, both eyes: Secondary | ICD-10-CM

## 2016-09-18 DIAGNOSIS — F209 Schizophrenia, unspecified: Secondary | ICD-10-CM | POA: Diagnosis present

## 2016-09-18 DIAGNOSIS — Z955 Presence of coronary angioplasty implant and graft: Secondary | ICD-10-CM

## 2016-09-18 DIAGNOSIS — R944 Abnormal results of kidney function studies: Secondary | ICD-10-CM | POA: Diagnosis not present

## 2016-09-18 DIAGNOSIS — R109 Unspecified abdominal pain: Secondary | ICD-10-CM

## 2016-09-18 DIAGNOSIS — K8500 Idiopathic acute pancreatitis without necrosis or infection: Secondary | ICD-10-CM | POA: Diagnosis not present

## 2016-09-18 HISTORY — DX: Schizophrenia, unspecified: F20.9

## 2016-09-18 LAB — URINE MICROSCOPIC-ADD ON

## 2016-09-18 LAB — CBC WITH DIFFERENTIAL/PLATELET
Basophils Absolute: 0 10*3/uL (ref 0.0–0.1)
Basophils Relative: 0 %
EOS ABS: 0.3 10*3/uL (ref 0.0–0.7)
Eosinophils Relative: 2 %
HCT: 28.2 % — ABNORMAL LOW (ref 36.0–46.0)
Hemoglobin: 9.7 g/dL — ABNORMAL LOW (ref 12.0–15.0)
LYMPHS ABS: 1.6 10*3/uL (ref 0.7–4.0)
Lymphocytes Relative: 12 %
MCH: 31.7 pg (ref 26.0–34.0)
MCHC: 34.4 g/dL (ref 30.0–36.0)
MCV: 92.2 fL (ref 78.0–100.0)
MONO ABS: 1.4 10*3/uL — AB (ref 0.1–1.0)
MONOS PCT: 11 %
Neutro Abs: 9.9 10*3/uL — ABNORMAL HIGH (ref 1.7–7.7)
Neutrophils Relative %: 75 %
Platelets: 274 10*3/uL (ref 150–400)
RBC: 3.06 MIL/uL — ABNORMAL LOW (ref 3.87–5.11)
RDW: 14.7 % (ref 11.5–15.5)
WBC: 13.1 10*3/uL — ABNORMAL HIGH (ref 4.0–10.5)

## 2016-09-18 LAB — COMPREHENSIVE METABOLIC PANEL
ALBUMIN: 2.4 g/dL — AB (ref 3.5–5.0)
ALT: 40 U/L (ref 14–54)
AST: 60 U/L — AB (ref 15–41)
Alkaline Phosphatase: 106 U/L (ref 38–126)
Anion gap: 8 (ref 5–15)
BILIRUBIN TOTAL: 0.6 mg/dL (ref 0.3–1.2)
BUN: 71 mg/dL — AB (ref 6–20)
CALCIUM: 8.1 mg/dL — AB (ref 8.9–10.3)
CO2: 23 mmol/L (ref 22–32)
CREATININE: 5.98 mg/dL — AB (ref 0.44–1.00)
Chloride: 106 mmol/L (ref 101–111)
GFR calc Af Amer: 7 mL/min — ABNORMAL LOW (ref >60)
GFR calc non Af Amer: 6 mL/min — ABNORMAL LOW (ref >60)
GLUCOSE: 111 mg/dL — AB (ref 65–99)
Potassium: 3.4 mmol/L — ABNORMAL LOW (ref 3.5–5.1)
Sodium: 137 mmol/L (ref 135–145)
Total Protein: 5.8 g/dL — ABNORMAL LOW (ref 6.5–8.1)

## 2016-09-18 LAB — URINALYSIS, ROUTINE W REFLEX MICROSCOPIC
GLUCOSE, UA: NEGATIVE mg/dL
Hgb urine dipstick: NEGATIVE
KETONES UR: NEGATIVE mg/dL
NITRITE: NEGATIVE
PH: 5 (ref 5.0–8.0)
Protein, ur: NEGATIVE mg/dL
SPECIFIC GRAVITY, URINE: 1.024 (ref 1.005–1.030)

## 2016-09-18 LAB — I-STAT CG4 LACTIC ACID, ED: Lactic Acid, Venous: 0.84 mmol/L (ref 0.5–1.9)

## 2016-09-18 LAB — BRAIN NATRIURETIC PEPTIDE: B Natriuretic Peptide: 270.6 pg/mL — ABNORMAL HIGH (ref 0.0–100.0)

## 2016-09-18 LAB — D-DIMER, QUANTITATIVE (NOT AT ARMC): D-Dimer, Quant: 3.79 ug/mL-FEU — ABNORMAL HIGH (ref 0.00–0.50)

## 2016-09-18 LAB — GLUCOSE, CAPILLARY: GLUCOSE-CAPILLARY: 147 mg/dL — AB (ref 65–99)

## 2016-09-18 LAB — MAGNESIUM: MAGNESIUM: 2.3 mg/dL (ref 1.7–2.4)

## 2016-09-18 LAB — I-STAT TROPONIN, ED: TROPONIN I, POC: 0.03 ng/mL (ref 0.00–0.08)

## 2016-09-18 LAB — PHOSPHORUS: Phosphorus: 4.9 mg/dL — ABNORMAL HIGH (ref 2.5–4.6)

## 2016-09-18 MED ORDER — TECHNETIUM TO 99M ALBUMIN AGGREGATED
4.2000 | Freq: Once | INTRAVENOUS | Status: AC | PRN
  Administered 2016-09-18: 4 via INTRAVENOUS

## 2016-09-18 MED ORDER — ONDANSETRON HCL 4 MG PO TABS: 4.0000 mg | ORAL_TABLET | Freq: Four times a day (QID) | ORAL | Status: DC | PRN

## 2016-09-18 MED ORDER — METRONIDAZOLE 500 MG PO TABS
500.0000 mg | ORAL_TABLET | Freq: Three times a day (TID) | ORAL | Status: DC
  Administered 2016-09-18 – 2016-09-24 (×18): 500 mg via ORAL
  Filled 2016-09-18 (×18): qty 1

## 2016-09-18 MED ORDER — SODIUM CHLORIDE 0.9% FLUSH
3.0000 mL | Freq: Two times a day (BID) | INTRAVENOUS | Status: DC
  Administered 2016-09-20 – 2016-10-01 (×15): 3 mL via INTRAVENOUS

## 2016-09-18 MED ORDER — FLUCONAZOLE 150 MG PO TABS
150.0000 mg | ORAL_TABLET | Freq: Once | ORAL | Status: AC
  Administered 2016-09-18: 150 mg via ORAL
  Filled 2016-09-18: qty 1

## 2016-09-18 MED ORDER — ACETAMINOPHEN 650 MG RE SUPP: 650.0000 mg | Freq: Four times a day (QID) | RECTAL | Status: DC | PRN

## 2016-09-18 MED ORDER — INSULIN ASPART 100 UNIT/ML ~~LOC~~ SOLN
0.0000 [IU] | Freq: Three times a day (TID) | SUBCUTANEOUS | Status: DC
  Administered 2016-09-20: 1 [IU] via SUBCUTANEOUS
  Administered 2016-09-20: 2 [IU] via SUBCUTANEOUS
  Administered 2016-09-21 – 2016-09-24 (×6): 1 [IU] via SUBCUTANEOUS
  Administered 2016-09-24: 2 [IU] via SUBCUTANEOUS
  Administered 2016-09-25: 1 [IU] via SUBCUTANEOUS

## 2016-09-18 MED ORDER — SODIUM CHLORIDE 0.9 % IV SOLN
INTRAVENOUS | Status: DC
Start: 2016-09-18 — End: 2016-09-21
  Administered 2016-09-18 – 2016-09-21 (×2): via INTRAVENOUS

## 2016-09-18 MED ORDER — TECHNETIUM TC 99M DIETHYLENETRIAME-PENTAACETIC ACID: 33.0000 | Freq: Once | INTRAVENOUS | Status: DC | PRN

## 2016-09-18 MED ORDER — SENNOSIDES-DOCUSATE SODIUM 8.6-50 MG PO TABS: 1.0000 | ORAL_TABLET | Freq: Every evening | ORAL | Status: DC | PRN

## 2016-09-18 MED ORDER — TECHNETIUM TC 99M DIETHYLENETRIAME-PENTAACETIC ACID: 40.0000 | Freq: Once | INTRAVENOUS | Status: DC | PRN

## 2016-09-18 MED ORDER — ACETAMINOPHEN 325 MG PO TABS
650.0000 mg | ORAL_TABLET | Freq: Four times a day (QID) | ORAL | Status: DC | PRN
  Administered 2016-09-19 – 2016-10-01 (×5): 650 mg via ORAL
  Filled 2016-09-18 (×6): qty 2

## 2016-09-18 MED ORDER — POTASSIUM CHLORIDE CRYS ER 20 MEQ PO TBCR
30.0000 meq | EXTENDED_RELEASE_TABLET | Freq: Once | ORAL | Status: DC
  Filled 2016-09-18 (×2): qty 1

## 2016-09-18 MED ORDER — HEPARIN SODIUM (PORCINE) 5000 UNIT/ML IJ SOLN
5000.0000 [IU] | Freq: Two times a day (BID) | INTRAMUSCULAR | Status: DC
  Administered 2016-09-19 – 2016-10-01 (×25): 5000 [IU] via SUBCUTANEOUS
  Filled 2016-09-18 (×25): qty 1

## 2016-09-18 MED ORDER — SODIUM CHLORIDE 0.9 % IV BOLUS (SEPSIS)
500.0000 mL | Freq: Once | INTRAVENOUS | Status: AC
  Administered 2016-09-18: 500 mL via INTRAVENOUS

## 2016-09-18 MED ORDER — ONDANSETRON HCL 4 MG/2ML IJ SOLN
4.0000 mg | Freq: Four times a day (QID) | INTRAMUSCULAR | Status: DC | PRN
  Administered 2016-09-28: 4 mg via INTRAVENOUS
  Filled 2016-09-18 (×2): qty 2

## 2016-09-18 NOTE — Progress Notes (Signed)
RN received report from ED, Pt arrived unit, alert and oriented though blind,  MD notified of Pt's location. MD in to evaluate Pt. Will continue with current plan of care.

## 2016-09-18 NOTE — ED Notes (Signed)
Nursing Tech tranferred patient to floor.

## 2016-09-18 NOTE — ED Notes (Signed)
Report called to floor RN

## 2016-09-18 NOTE — H&P (Signed)
History and Physical  [name]:[id number] DOB: 1936/11/26 DOA: 09/18/2016  Referring physician: Iantha Fallen PCP: Georgann Housekeeper, MD   Chief Complaint: abnormal lab test  HPI: [name] is a 80 y.o. female past medical history significant for CAD, diabetes, hypertension, systolic heart failure, CK D stage III, blindness presents to the ED today by EMS from [facility] assisted living for elevated creatinine. Patient had her blood work drawn yesterday that showed a creatinine of 5. On 9/18 her creatine was 1.7. She is also noted to have a leukocytosis of 16.3. Patient has recently been treated for a UTI and pneumonia in the past month. She was started on Rocephin, Levaquin, clindamycin, Macrobid. She finished her Levaquin and clindamycin antibiotics on September 24. She started complaining of diarrhea. Nursing home tested her stool for C. difficile yesterday. Nursing home the day and the results are still pending. They started her on Flagyl 500 BID for 14 days. She is currently on day 2 of flagyl. Patient states that she has had diarrhea for the past 3 weeks. She states her stools have been loose and foul-smelling. She also complains of a cough for the past 2 days that is nonproductive. Patient also endorses mild shortness of breath. Patient denies any fever, chills, lightheadedness, dizziness, headache, vision changes, chest pain, abdominal pain, urinary symptoms, numbness/tingling.  She is being admitted for further evaluation and management.    Review of Systems:  Constitutional: Negative for chills and fever.  HENT: Negative for congestion, ear pain, rhinorrhea and sore throat.   Eyes: Negative for pain and visual disturbance.  Respiratory: Positive for cough and shortness of breath.   Cardiovascular: Negative for chest pain, palpitations and leg swelling.  Gastrointestinal: Negative for abdominal pain, blood in stool, diarrhea, nausea and vomiting.  Genitourinary: Negative  for dysuria, flank pain, frequency, hematuria and urgency.  Musculoskeletal: Negative for arthralgias and back pain.  Skin: Negative for color change and rash.  Neurological: Negative for dizziness, syncope, weakness, light-headedness and numbness.  All other systems reviewed and are negative.   Past Medical History:  Diagnosis Date  . Anxiety   . Arthritis   . Blood transfusion without reported diagnosis   . Chronic diastolic CHF (congestive heart failure) (HCC)   . Coronary artery disease    LHC 05/25/06: No renal artery stenosis, proximal LAD 40%, proximal D1 40%  . Diabetes mellitus    a1c 7.3 in 10/2011  . GERD (gastroesophageal reflux disease)   . GLAUCOMA 12/06/2008   Vision Loss right eye   . HYPERLIPIDEMIA 12/27/2006  . Hypertension   . NICM (nonischemic cardiomyopathy) (HCC)   . OSA (obstructive sleep apnea) 03/04/2012  . Proliferative diabetic retinopathy 01/28/2009   Severe Disease with Near Complete Blindness.    . Stage III chronic kidney disease 01/10/2007  . Systolic heart failure (HCC)    Echo 10/09: EF 30-35%. Echo 2/13: EF 50-55%.  . Visual hallucinations 07/15/2011   Past Surgical History:  Procedure Laterality Date  . CORONARY ANGIOPLASTY WITH STENT PLACEMENT    . EYE SURGERY  2012   both eyes   Social History:  reports that she has never smoked. She has never used smokeless tobacco. She reports that she does not drink alcohol or use drugs.   Allergies  Allergen Reactions  . Lisinopril     REACTION: cough    Family History  Problem Relation Age of Onset  . Heart disease Maternal Grandmother   . Prostate cancer Father   .  Colon cancer Maternal Aunt    Prior to Admission medications   Medication Sig Start Date End Date Taking? Authorizing Provider  allopurinol (ZYLOPRIM) 100 MG tablet Take 200 mg by mouth daily.    Historical Provider, MD  amLODipine (NORVASC) 5 MG tablet Take 5 mg by mouth daily.    Historical Provider, MD  atorvastatin (LIPITOR)  20 MG tablet Take 1 tablet (20 mg total) by mouth daily. 12/30/12   Ignacia Palmaarolyn M Ziemer, MD  brimonidine (ALPHAGAN) 0.2 % ophthalmic solution Place 1 drop into both eyes daily.    Historical Provider, MD  carvedilol (COREG) 25 MG tablet Take 1 tablet (25 mg total) by mouth 2 (two) times daily with a meal. 04/08/12   Denna HaggardNodira J Karimova, MD  cephALEXin (KEFLEX) 500 MG capsule Take 1 capsule (500 mg total) by mouth 4 (four) times daily. 03/05/15   Linwood DibblesJon Knapp, MD  docusate sodium 100 MG CAPS Take 100 mg by mouth 2 (two) times daily. 09/13/12   Emory Nani Skillern McTyre, MD  ergocalciferol (VITAMIN D2) 50000 UNITS capsule Take 50,000 Units by mouth once a week. No specific day    Historical Provider, MD  furosemide (LASIX) 20 MG tablet Take 20 mg by mouth 2 (two) times daily.    Historical Provider, MD  hydrALAZINE (APRESOLINE) 50 MG tablet Take 50 mg by mouth 3 (three) times daily.    Historical Provider, MD  insulin glargine (LANTUS) 100 UNIT/ML injection Inject 15 Units into the skin daily. 12/30/12   Ignacia Palmaarolyn M Ziemer, MD  QUEtiapine (SEROQUEL) 25 MG tablet Take 25 mg by mouth 2 (two) times daily.    Historical Provider, MD  sennosides-docusate sodium (SENOKOT-S) 8.6-50 MG tablet Take 2 tablets by mouth daily.    Historical Provider, MD   Physical Exam: Vitals:   09/18/16 1125 09/18/16 1131 09/18/16 1551  BP: (!) 94/50 (!) 115/36 (!) 105/45  Pulse: 70 66 68  Resp: 18 13 18   Temp:  98 F (36.7 C)   TempSrc:  Oral   SpO2: 98% 98% 98%  Weight: 101.2 kg (223 lb)     Constitutional: She is oriented to person, place, and time. She appears well-developed and well-nourished. No distress.  HENT: Head: Normocephalic and atraumatic.  Mouth/Throat: Oropharynx is clear and moist and mucous membranes are normal.  Eyes: Conjunctivae are normal. Right eye exhibits no discharge. Left eye exhibits no discharge. No scleral icterus. Patient is blind  Neck: Normal range of motion. Neck supple. No thyromegaly present.    Cardiovascular: Normal rate, regular rhythm, normal heart sounds and intact distal pulses.  Soft systolic heart murmur noted.  Pulmonary/Chest: Effort normal and breath sounds normal.  Abdominal: Soft. Bowel sounds are normal. She exhibits no distension. There is no tenderness. There is no rebound and no guarding.  Musculoskeletal: Normal range of motion.  Lymphadenopathy:   She has no cervical adenopathy.  Neurological: She is alert and oriented to person, place, and time. She has normal strength. No cranial nerve deficit or sensory deficit. She exhibits normal muscle tone. Coordination normal.  Skin: Skin is warm and dry. Capillary refill takes less than 2 seconds.  Labs on Admission:  Basic Metabolic Panel:  Recent Labs Lab 09/18/16 1309  NA 137  K 3.4*  CL 106  CO2 23  GLUCOSE 111*  BUN 71*  CREATININE 5.98*  CALCIUM 8.1*  MG 2.3  PHOS 4.9*   Liver Function Tests:  Recent Labs Lab 09/18/16 1309  AST 60*  ALT 40  ALKPHOS  106  BILITOT 0.6  PROT 5.8*  ALBUMIN 2.4*   No results for input(s): LIPASE, AMYLASE in the last 168 hours. No results for input(s): AMMONIA in the last 168 hours. CBC:  Recent Labs Lab 09/18/16 1309  WBC 13.1*  NEUTROABS 9.9*  HGB 9.7*  HCT 28.2*  MCV 92.2  PLT 274   Cardiac Enzymes: No results for input(s): CKTOTAL, CKMB, CKMBINDEX, TROPONINI in the last 168 hours.  BNP (last 3 results) No results for input(s): PROBNP in the last 8760 hours. CBG: No results for input(s): GLUCAP in the last 168 hours.  Radiological Exams on Admission: Dg Chest 2 View  Result Date: 09/18/2016 CLINICAL DATA:  Productive cough, nausea, vomiting and diarrhea for 1 week. EXAM: CHEST  2 VIEW COMPARISON:  Chest x-ray dated 12/27/2012. FINDINGS: Stable mild cardiomegaly. Lungs are clear. No pleural effusion or pneumothorax seen. Stable degenerative change within the thoracic spine and at the bilateral shoulders. Stable ankylosis of the thoracic spine. No  acute or suspicious osseous finding. IMPRESSION: No active cardiopulmonary disease. No evidence of pneumonia. Stable cardiomegaly. Electronically Signed   By: Bary Richard M.D.   On: 09/18/2016 13:31   EKG: Independently reviewed.   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 difficile diarrhea   Leukocytosis   Acute renal failure (ARF) (HCC)  1. Acute Renal Failure on CKD stage 3 - suspect prerenal from severe diarrhea, dehydration and will hydrate with IVFs, repeat BMP in AM, check renal ultrasound. Renal diet, renally dose medications.  2. Suspected C. Diff Diarrhea - testing was done at facility but not yet resulted, would assume c. Diff and place on enteric precautions, continue flagyl 500 mg every 8 hours for now. Repeat GI stool testing ordered.   3. Type 2 DM with diabetic retinopathy and blindness -  Hold Lantus for now.  Check 3am BS. Monitor accuchecks and supplemental sliding scale coverage ordered.  Night time snack ordered.   4. Chronic Diastolic heart failure - seems well compensated at this time. Holding lasix for now secondary to #1.  5. Cough with SOB and elevated D dimer - Pt was sent for V/Q scan to rule out PE.   6. Leukocytosis - likely elevated secondary to c. Diff infection, pneumonia seems resolved on chest xray and urine shows yeast which was treated with fluconazole in ED 9/29.  7. Anemia chronic Disease - monitor Hg closely, suspect Hg to drop further with IV fluid hydration.  8. Essential Hypertension - blood pressures have been soft with her dehydration, holding blood pressure lowering medications at this time.  9. Schizophrenia -  resuming home meds, but renally dosed.  10. Stable CAD - troponin WNL.   Standley Dakins, MD [facility] Pager [phone]  If 7PM-7AM, please contact night-coverage [email or web address] Password [facility] 09/18/2016, 4:44 PM

## 2016-09-18 NOTE — ED Provider Notes (Signed)
[facility]-EMERGENCY DEPT Provider Note   CSN: [id number] Arrival date & time: 09/18/16  1055     History   Chief Complaint Chief Complaint  Patient presents with  . Abnormal Labs    HPI [name] is a 80 y.o. female.  Symptoms 27-year-old African-American female past medical history significant for CAD, diabetes, hypertension, systolic heart failure, CK D stage III, blindness presents to the ED today by EMS from [facility] assisted living for elevated creatinine. Patient had her blood work drawn yesterday that showed a creatinine of 5. On 9/18 her creatine was 1.7. She is also noted to have a leukocytosis of 16.3. Patient has recently been treated for a UTI and pneumonia in the past month. She was started on Rocephin, Levaquin, clindamycin, Macrobid. She finished her Levaquin and clindamycin antibiotics on September 24. She started complaining of diarrhea. Nursing home tested her stool for C. difficile yesterday. Nursing home the day and the results are still pending. They started her on Flagyl 500 BID for 14 days. She is currently on day 2 of flagyl. On my assessment patient states that she has had diarrhea for the past 3 weeks. She states her stools have been loose and foul-smelling. She also complains of a cough for the past 2 days that is nonproductive. Patient also endorses mild shortness of breath. Patient denies any fever, chills, lightheadedness, dizziness, headache, vision changes, chest pain, abdominal pain, urinary symptoms, numbness/tingling.      Past Medical History:  Diagnosis Date  . Anxiety   . Arthritis   . Blood transfusion without reported diagnosis   . Chronic diastolic CHF (congestive heart failure) (HCC)   . Coronary artery disease    LHC 05/25/06: No renal artery stenosis, proximal LAD 40%, proximal D1 40%  . Diabetes mellitus    a1c 7.3 in 10/2011  . GERD (gastroesophageal reflux disease)   . GLAUCOMA 12/06/2008   Vision Loss right eye   .  HYPERLIPIDEMIA 12/27/2006  . Hypertension   . NICM (nonischemic cardiomyopathy) (HCC)   . OSA (obstructive sleep apnea) 03/04/2012  . Proliferative diabetic retinopathy 01/28/2009   Severe Disease with Near Complete Blindness.    . Stage III chronic kidney disease 01/10/2007  . Systolic heart failure (HCC)    Echo 10/09: EF 30-35%. Echo 2/13: EF 50-55%.  . Visual hallucinations 07/15/2011    Patient Active Problem List   Diagnosis Date Noted  . Anemia in stage 3 chronic kidney disease 11/06/2014  . Acute encephalopathy 11/05/2014  . Diabetes (HCC) 10/04/2014  . Type II or unspecified type diabetes mellitus with renal manifestations, uncontrolled 10/13/2013  . Gout, unspecified 09/14/2013  . Urinary tract infection, site not specified 08/31/2013  . Edema 07/06/2013  . Encounter for long-term (current) use of other medications 07/06/2013  . Anemia in chronic kidney disease(285.21) 07/05/2013  . 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 Type II or unspecified type diabetes mellitus with renal manifestations, not stated as uncontrolled 06/02/2013  . Unspecified constipation 04/20/2013  . Psychosis 04/20/2013  . Right ankle sprain 12/30/2012  . Edema of foot 12/28/2012  . Acute on chronic renal failure (HCC) 12/27/2012  . Nonischemic cardiomyopathy (HCC) 12/27/2012  . Dysuria 12/27/2012  . UTI (lower urinary tract infection) 09/10/2012  . Chronic diastolic congestive heart failure (HCC) 04/01/2012  . OSA (obstructive sleep apnea) 03/04/2012  . Preventative health care 11/19/2011  . Insomnia 08/04/2011  . Boil of buttock 08/04/2011  . Visual hallucinations 07/15/2011  . Proliferative diabetic retinopathy(362.02) 01/28/2009  .  GLAUCOMA 12/06/2008  . CORONARY ARTERY DISEASE 10/19/2008  . ALLERGIC RHINITIS 01/03/2008  . Stage III chronic kidney disease 01/10/2007  . DIABETES MELLITUS, TYPE II 12/27/2006  . HYPERLIPIDEMIA 12/27/2006  . Essential hypertension 12/27/2006    . Chronic diastolic heart failure (HCC) 12/27/2006    Past Surgical History:  Procedure Laterality Date  . CORONARY ANGIOPLASTY WITH STENT PLACEMENT    . EYE SURGERY  2012   both eyes    OB History    No data available       Home Medications    Prior to Admission medications   Medication Sig Start Date End Date Taking? Authorizing Provider  allopurinol (ZYLOPRIM) 100 MG tablet Take 200 mg by mouth daily.    Historical Provider, MD  amLODipine (NORVASC) 5 MG tablet Take 5 mg by mouth daily.    Historical Provider, MD  atorvastatin (LIPITOR) 20 MG tablet Take 1 tablet (20 mg total) by mouth daily. 12/30/12   Ignacia Palma, MD  brimonidine (ALPHAGAN) 0.2 % ophthalmic solution Place 1 drop into both eyes daily.    Historical Provider, MD  carvedilol (COREG) 25 MG tablet Take 1 tablet (25 mg total) by mouth 2 (two) times daily with a meal. 04/08/12   Denna Haggard, MD  cephALEXin (KEFLEX) 500 MG capsule Take 1 capsule (500 mg total) by mouth 4 (four) times daily. 03/05/15   Linwood Dibbles, MD  docusate sodium 100 MG CAPS Take 100 mg by mouth 2 (two) times daily. 09/13/12   Emory Nani Skillern, MD  ergocalciferol (VITAMIN D2) 50000 UNITS capsule Take 50,000 Units by mouth once a week. No specific day    Historical Provider, MD  furosemide (LASIX) 20 MG tablet Take 20 mg by mouth 2 (two) times daily.    Historical Provider, MD  hydrALAZINE (APRESOLINE) 50 MG tablet Take 50 mg by mouth 3 (three) times daily.    Historical Provider, MD  insulin glargine (LANTUS) 100 UNIT/ML injection Inject 15 Units into the skin daily. 12/30/12   Ignacia Palma, MD  QUEtiapine (SEROQUEL) 25 MG tablet Take 25 mg by mouth 2 (two) times daily.    Historical Provider, MD  sennosides-docusate sodium (SENOKOT-S) 8.6-50 MG tablet Take 2 tablets by mouth daily.    Historical Provider, MD    Family History Family History  Problem Relation Age of Onset  . Heart disease Maternal Grandmother   . Prostate cancer Father    . Colon cancer Maternal Aunt     Social History Social History  Substance Use Topics  . Smoking status: Never Smoker  . Smokeless tobacco: Never Used     Comment: does have second hand smoke exposure  . Alcohol use No     Allergies   Lisinopril   Review of Systems Review of Systems  Constitutional: Negative for chills and fever.  HENT: Negative for congestion, ear pain, rhinorrhea and sore throat.   Eyes: Negative for pain and visual disturbance.  Respiratory: Positive for cough and shortness of breath.   Cardiovascular: Negative for chest pain, palpitations and leg swelling.  Gastrointestinal: Negative for abdominal pain, blood in stool, diarrhea, nausea and vomiting.  Genitourinary: Negative for dysuria, flank pain, frequency, hematuria and urgency.  Musculoskeletal: Negative for arthralgias and back pain.  Skin: Negative for color change and rash.  Neurological: Negative for dizziness, syncope, weakness, light-headedness and numbness.  All other systems reviewed and are negative.    Physical Exam Updated Vital Signs BP (!) 105/45 (BP Location: Right  Wrist)   Pulse 68   Temp 98 F (36.7 C) (Oral)   Resp 18   Wt 101.2 kg   SpO2 98%   BMI 38.28 kg/m   Physical Exam  Constitutional: She is oriented to person, place, and time. She appears well-developed and well-nourished. No distress.  HENT:  Head: Normocephalic and atraumatic.  Right Ear: Tympanic membrane, external ear and ear canal normal.  Left Ear: Tympanic membrane, external ear and ear canal normal.  Mouth/Throat: Oropharynx is clear and moist and mucous membranes are normal.  Eyes: Conjunctivae are normal. Right eye exhibits no discharge. Left eye exhibits no discharge. No scleral icterus.  Patient is blind  Neck: Normal range of motion. Neck supple. No thyromegaly present.  Cardiovascular: Normal rate, regular rhythm, normal heart sounds and intact distal pulses.   Soft systolic heart murmur noted.   Pulmonary/Chest: Effort normal and breath sounds normal.  Abdominal: Soft. Bowel sounds are normal. She exhibits no distension. There is no tenderness. There is no rebound and no guarding.  Musculoskeletal: Normal range of motion.  Lymphadenopathy:    She has no cervical adenopathy.  Neurological: She is alert and oriented to person, place, and time. She has normal strength. No cranial nerve deficit or sensory deficit. She exhibits normal muscle tone. Coordination normal. GCS eye subscore is 4. GCS verbal subscore is 5. GCS motor subscore is 6.  Skin: Skin is warm and dry. Capillary refill takes less than 2 seconds.  Nursing note and vitals reviewed.    ED Treatments / Results  Labs (all labs ordered are listed, but only abnormal results are displayed) Labs Reviewed  COMPREHENSIVE METABOLIC PANEL - Abnormal; Notable for the following:       Result Value   Potassium 3.4 (*)    Glucose, Bld 111 (*)    BUN 71 (*)    Creatinine, Ser 5.98 (*)    Calcium 8.1 (*)    Total Protein 5.8 (*)    Albumin 2.4 (*)    AST 60 (*)    GFR calc non Af Amer 6 (*)    GFR calc Af Amer 7 (*)    All other components within normal limits  BRAIN NATRIURETIC PEPTIDE - Abnormal; Notable for the following:    B Natriuretic Peptide 270.6 (*)    All other components within normal limits  CBC WITH DIFFERENTIAL/PLATELET - Abnormal; Notable for the following:    WBC 13.1 (*)    RBC 3.06 (*)    Hemoglobin 9.7 (*)    HCT 28.2 (*)    Neutro Abs 9.9 (*)    Monocytes Absolute 1.4 (*)    All other components within normal limits  D-DIMER, QUANTITATIVE (NOT AT [facility]) - Abnormal; Notable for the following:    D-Dimer, Quant 3.79 (*)    All other components within normal limits  URINALYSIS, ROUTINE W REFLEX MICROSCOPIC (NOT AT [facility]) - Abnormal; Notable for the following:    Color, Urine AMBER (*)    APPearance CLOUDY (*)    Bilirubin Urine SMALL (*)    Leukocytes, UA TRACE (*)    All other components within  normal limits  URINE MICROSCOPIC-ADD ON - Abnormal; Notable for the following:    Squamous Epithelial / LPF 6-30 (*)    Bacteria, UA FEW (*)    Casts HYALINE CASTS (*)    All other components within normal limits  I-STAT TROPOININ, ED  I-STAT CG4 LACTIC ACID, ED    EKG  EKG Interpretation None  Radiology Dg Chest 2 View  Result Date: 09/18/2016 CLINICAL DATA:  Productive cough, nausea, vomiting and diarrhea for 1 week. EXAM: CHEST  2 VIEW COMPARISON:  Chest x-ray dated 12/27/2012. FINDINGS: Stable mild cardiomegaly. Lungs are clear. No pleural effusion or pneumothorax seen. Stable degenerative change within the thoracic spine and at the bilateral shoulders. Stable ankylosis of the thoracic spine. No acute or suspicious osseous finding. IMPRESSION: No active cardiopulmonary disease. No evidence of pneumonia. Stable cardiomegaly. Electronically Signed   By: Bary RichardStan  Maynard M.D.   On: 09/18/2016 13:31    Procedures Procedures (including critical care time)  Medications Ordered in ED Medications  sodium chloride 0.9 % bolus 500 mL (not administered)  fluconazole (DIFLUCAN) tablet 150 mg (not administered)     Initial Impression / Assessment and Plan / ED Course  I have reviewed the triage vital signs and the nursing notes.  Pertinent labs & imaging results that were available during my care of the patient were reviewed by me and considered in my medical decision making (see chart for details).  Clinical Course  Patient presents to ED with elevated creatinine from nursing home. On further exam patient states that she's had diarrhea for the past 3 weeks. She has been on multiple antibiotics for UTI and pneumonia over the past month. Creatinine today was noted to be 5.98. She also was noted to have a leukocytosis of 13. This is down from 16 yesterday. Her hemoglobin is 9.7. Which is stable for patient. Patient's BNP was slightly elevated. No signs of fluid overload. Chest x-ray is  unremarkable stable cardiomegaly. Troponin was negative. EKG without acute changes. D-dimer was noted to be elevated. Unable to do CTA due to patient's creatinine. Will order a VQ scan. Patient given 500 mL bolus of fluid due to hypotension. UA showed yeast. Nurse states that patient had thick discharge during urine cath. We'll treat with Diflucan for yeast infection. Nurse states patient had 250 cc of urine return. Patient seen and evaluated by Dr. Eudelia Bunchardama who agrees above plan. Will consult with hospital medicine for further admission. Dr. Laural BenesJohnson agrees to admit patient. Patient is non toxic appearing. Vital signs are stable in she is in NAD.  Final Clinical Impressions(s) / ED Diagnoses   Final diagnoses:  Positive D dimer  AKI (acute kidney injury) (HCC)  Vaginal candidiasis    New Prescriptions New Prescriptions   No medications on file     Rise MuKenneth T Garv Kuechle, PA-C 09/18/16 1635    Nira ConnPedro Eduardo Cardama, MD 09/18/16 [phone]

## 2016-09-18 NOTE — ED Triage Notes (Signed)
Sent blood work off yesterday and got results for her creatine 5.03

## 2016-09-19 DIAGNOSIS — I130 Hypertensive heart and chronic kidney disease with heart failure and stage 1 through stage 4 chronic kidney disease, or unspecified chronic kidney disease: Secondary | ICD-10-CM

## 2016-09-19 DIAGNOSIS — Z794 Long term (current) use of insulin: Secondary | ICD-10-CM

## 2016-09-19 DIAGNOSIS — E1121 Type 2 diabetes mellitus with diabetic nephropathy: Secondary | ICD-10-CM

## 2016-09-19 DIAGNOSIS — E1139 Type 2 diabetes mellitus with other diabetic ophthalmic complication: Secondary | ICD-10-CM

## 2016-09-19 LAB — GLUCOSE, CAPILLARY
GLUCOSE-CAPILLARY: 128 mg/dL — AB (ref 65–99)
GLUCOSE-CAPILLARY: 160 mg/dL — AB (ref 65–99)
GLUCOSE-CAPILLARY: 161 mg/dL — AB (ref 65–99)
Glucose-Capillary: 148 mg/dL — ABNORMAL HIGH (ref 65–99)
Glucose-Capillary: 154 mg/dL — ABNORMAL HIGH (ref 65–99)
Glucose-Capillary: 153 mg/dL — ABNORMAL HIGH (ref 65–99)

## 2016-09-19 LAB — BASIC METABOLIC PANEL
Anion gap: 9 (ref 5–15)
BUN: 70 mg/dL — AB (ref 6–20)
CO2: 21 mmol/L — AB (ref 22–32)
Calcium: 8 mg/dL — ABNORMAL LOW (ref 8.9–10.3)
Chloride: 108 mmol/L (ref 101–111)
Creatinine, Ser: 5.29 mg/dL — ABNORMAL HIGH (ref 0.44–1.00)
GFR calc Af Amer: 8 mL/min — ABNORMAL LOW (ref >60)
GFR, EST NON AFRICAN AMERICAN: 7 mL/min — AB (ref >60)
GLUCOSE: 112 mg/dL — AB (ref 65–99)
Potassium: 3.2 mmol/L — ABNORMAL LOW (ref 3.5–5.1)
Sodium: 138 mmol/L (ref 135–145)

## 2016-09-19 LAB — CBC
HEMATOCRIT: 26.9 % — AB (ref 36.0–46.0)
HEMOGLOBIN: 9.1 g/dL — AB (ref 12.0–15.0)
MCH: 31 pg (ref 26.0–34.0)
MCHC: 33.8 g/dL (ref 30.0–36.0)
MCV: 91.5 fL (ref 78.0–100.0)
Platelets: 278 10*3/uL (ref 150–400)
RBC: 2.94 MIL/uL — ABNORMAL LOW (ref 3.87–5.11)
RDW: 14.8 % (ref 11.5–15.5)
WBC: 10.5 10*3/uL (ref 4.0–10.5)

## 2016-09-19 MED ORDER — QUETIAPINE FUMARATE 25 MG PO TABS
25.0000 mg | ORAL_TABLET | Freq: Two times a day (BID) | ORAL | Status: DC
  Administered 2016-09-19 – 2016-10-01 (×16): 25 mg via ORAL
  Filled 2016-09-19 (×21): qty 1

## 2016-09-19 MED ORDER — ALLOPURINOL 100 MG PO TABS
100.0000 mg | ORAL_TABLET | Freq: Every day | ORAL | Status: DC
  Administered 2016-09-19 – 2016-10-01 (×11): 100 mg via ORAL
  Filled 2016-09-19 (×12): qty 1

## 2016-09-19 MED ORDER — BRIMONIDINE TARTRATE 0.2 % OP SOLN
1.0000 [drp] | Freq: Every day | OPHTHALMIC | Status: DC
  Administered 2016-09-19 – 2016-10-01 (×11): 1 [drp] via OPHTHALMIC
  Filled 2016-09-19: qty 5

## 2016-09-19 NOTE — Progress Notes (Addendum)
Patient ID: [name], female   DOB: 02/28/1936, 80 y.o.   MRN: [id number]  PROGRESS NOTE    DELAYZA COWDREY  LOV:[id number] DOB: December 12, 1936 DOA: 09/18/2016  PCP: Georgann Housekeeper, MD   Brief Narrative:   80 y.o. female with past medical history significant for CAD, diabetes, hypertension, systolic congestive heart failure, CKD stage III, blindness who preseneds to ED from [facility] assisted living for elevated creatinine. On 9/18 ptcreatine was 1.7 and on this admission 5.98. She was also found to have leukocytosis of 16.3. Patient has recently been treated for a UTI and pneumonia in the past month. She was started on Rocephin, Levaquin, clindamycin, Macrobid. She finished her Levaquin and clindamycin antibiotics on 09/13/2016. She started to have diarrhea and subsequently was found to have stool positive for C.diff.   Assessment & Plan:   Principal Problem:   Acute renal failure superimposed on CKD stage 3 - Baseline Cr 1.7 and on this admission 5.98 - Possible from prerenal causes, dehydration, diarrhea, lasix - Lasix on hold  - Continue to monitor daily BMP - Continue IV fluids  - No acute findings on renal US  Active Problems:   Type 2 diabetes mellitus with blindness and proliferative retinopathy and diabetic nephropathy with long term insulin use - Continue current insulin regimen    Essential hypertension - BP med (norvasc) on hold due to soft BP    Chronic diastolic heart failure (HCC) - Compensated  - Lasix on hold due to acute renal failure    Anemia in chronic kidney disease - Hemoglobin stable     Hypokalemia - Due to lasix and diarrhea - Supplemented - Check BMP in am    C. difficile diarrhea / Leukocytosis - On flagyl (day 3 today)    DVT prophylaxis: Heparin subQ Code Status: DNR/DNI  Family Communication: no family at the bedside this am Disposition Plan: to ALF likely by 10/2 or 10/3    Consultants:   None   Procedures:   None    Antimicrobials:   Flagyl 09/18/2016 -->   Subjective: No overnight events.   Objective: Vitals:   09/19/16 0641 09/19/16 0800 09/19/16 1502 09/19/16 2150  BP: (!) 114/39  (!) 107/42 (!) 101/40  Pulse: 71  70 71  Resp: 18  18 20   Temp: 97.8 F (36.6 C)  97.6 F (36.4 C) 97.7 F (36.5 C)  TempSrc: Oral  Oral Oral  SpO2: 97%  98% 99%  Weight:  98.4 kg (217 lb)    Height:  5\' 4"  (1.626 m)      Intake/Output Summary (Last 24 hours) at 09/19/16 2214 Last data filed at 09/19/16 1800  Gross per 24 hour  Intake             2400 ml  Output                0 ml  Net             2400 ml   Filed Weights   09/18/16 1125 09/19/16 0800  Weight: 101.2 kg (223 lb) 98.4 kg (217 lb)    Examination:  General exam: Appears calm and comfortable  Respiratory system: Clear to auscultation. Respiratory effort normal. Cardiovascular system: S1 & S2 heard, rate controlled  Gastrointestinal system: (+) BS, non tender  Central nervous system: No focal neurological deficits. Extremities: No swelling, palpable pulses  Skin: Warm and dry  Psychiatry: Mood & affect appropriate.   Data Reviewed: I have personally reviewed following  labs and imaging studies  CBC:  Recent Labs Lab 09/18/16 1309 09/19/16 0553  WBC 13.1* 10.5  NEUTROABS 9.9*  --   HGB 9.7* 9.1*  HCT 28.2* 26.9*  MCV 92.2 91.5  PLT 274 278   Basic Metabolic Panel:  Recent Labs Lab 09/18/16 1309 09/19/16 0553  NA 137 138  K 3.4* 3.2*  CL 106 108  CO2 23 21*  GLUCOSE 111* 112*  BUN 71* 70*  CREATININE 5.98* 5.29*  CALCIUM 8.1* 8.0*  MG 2.3  --   PHOS 4.9*  --    GFR: Estimated Creatinine Clearance: 10.2 mL/min (by C-G formula based on SCr of 5.29 mg/dL (H)). Liver Function Tests:  Recent Labs Lab 09/18/16 1309  AST 60*  ALT 40  ALKPHOS 106  BILITOT 0.6  PROT 5.8*  ALBUMIN 2.4*   No results for input(s): LIPASE, AMYLASE in the last 168 hours. No results for input(s): AMMONIA in the last 168  hours. Coagulation Profile: No results for input(s): INR, PROTIME in the last 168 hours. Cardiac Enzymes: No results for input(s): CKTOTAL, CKMB, CKMBINDEX, TROPONINI in the last 168 hours. BNP (last 3 results) No results for input(s): PROBNP in the last 8760 hours. HbA1C: No results for input(s): HGBA1C in the last 72 hours. CBG:  Recent Labs Lab 09/19/16 0651 09/19/16 0739 09/19/16 1138 09/19/16 1701 09/19/16 2149  GLUCAP 148* 154* 160* 153* 161*   Lipid Profile: No results for input(s): CHOL, HDL, LDLCALC, TRIG, CHOLHDL, LDLDIRECT in the last 72 hours. Thyroid Function Tests: No results for input(s): TSH, T4TOTAL, FREET4, T3FREE, THYROIDAB in the last 72 hours. Anemia Panel: No results for input(s): VITAMINB12, FOLATE, FERRITIN, TIBC, IRON, RETICCTPCT in the last 72 hours. Urine analysis:    Component Value Date/Time   COLORURINE AMBER (A) 09/18/2016 1348   APPEARANCEUR CLOUDY (A) 09/18/2016 1348   LABSPEC 1.024 09/18/2016 1348   PHURINE 5.0 09/18/2016 1348   GLUCOSEU NEGATIVE 09/18/2016 1348   GLUCOSEU NEG mg/dL 16/09/9603 5409   HGBUR NEGATIVE 09/18/2016 1348   HGBUR negative 07/02/2009 1003   BILIRUBINUR SMALL (A) 09/18/2016 1348   KETONESUR NEGATIVE 09/18/2016 1348   PROTEINUR NEGATIVE 09/18/2016 1348   UROBILINOGEN 0.2 03/05/2015 1854   NITRITE NEGATIVE 09/18/2016 1348   LEUKOCYTESUR TRACE (A) 09/18/2016 1348   Sepsis Labs: @LABRCNTIP (procalcitonin:4,lacticidven:4)   )No results found for this or any previous visit (from the past 240 hour(s)).    Radiology Studies: Dg Chest 2 View Result Date: 09/18/2016 No active cardiopulmonary disease. No evidence of pneumonia. Stable cardiomegaly.  US Renal Result Date: 09/18/2016 Exam is somewhat limited due to body habitus. No definite renal abnormality is noted.   Nm Pulmonary Perf And Vent Result Date: 09/18/2016 No evidence of pulmonary embolism.     Scheduled Meds: . allopurinol  100 mg Oral Q  breakfast  . brimonidine  1 drop Left Eye QPC breakfast  . heparin  5,000 Units Subcutaneous Q12H  . insulin aspart  0-9 Units Subcutaneous TID WC  . metroNIDAZOLE  500 mg Oral Q8H  . potassium chloride  30 mEq Oral Once  . QUEtiapine  25 mg Oral BID  . sodium chloride flush  3 mL Intravenous Q12H   Continuous Infusions: . sodium chloride Stopped (09/19/16 2138)     LOS: 1 day    Time spent: 25 minutes  Greater than 50% of the time spent on counseling and coordinating the care.   Manson Passey, MD [facility] Pager [phone]  If 7PM-7AM, please contact night-coverage  [email or web address] Password [email or web address] 09/19/2016, 10:14 PM

## 2016-09-20 DIAGNOSIS — E876 Hypokalemia: Secondary | ICD-10-CM

## 2016-09-20 DIAGNOSIS — N179 Acute kidney failure, unspecified: Secondary | ICD-10-CM

## 2016-09-20 DIAGNOSIS — N183 Chronic kidney disease, stage 3 (moderate): Secondary | ICD-10-CM

## 2016-09-20 LAB — CBC
HCT: 27.9 % — ABNORMAL LOW (ref 36.0–46.0)
Hemoglobin: 9.5 g/dL — ABNORMAL LOW (ref 12.0–15.0)
MCH: 31.4 pg (ref 26.0–34.0)
MCHC: 34.1 g/dL (ref 30.0–36.0)
MCV: 92.1 fL (ref 78.0–100.0)
PLATELETS: 277 10*3/uL (ref 150–400)
RBC: 3.03 MIL/uL — AB (ref 3.87–5.11)
RDW: 14.8 % (ref 11.5–15.5)
WBC: 10.6 10*3/uL — AB (ref 4.0–10.5)

## 2016-09-20 LAB — GLUCOSE, CAPILLARY
GLUCOSE-CAPILLARY: 128 mg/dL — AB (ref 65–99)
GLUCOSE-CAPILLARY: 113 mg/dL — AB (ref 65–99)
GLUCOSE-CAPILLARY: 153 mg/dL — AB (ref 65–99)
Glucose-Capillary: 156 mg/dL — ABNORMAL HIGH (ref 65–99)
Glucose-Capillary: 123 mg/dL — ABNORMAL HIGH (ref 65–99)

## 2016-09-20 LAB — BASIC METABOLIC PANEL
Anion gap: 10 (ref 5–15)
BUN: 71 mg/dL — ABNORMAL HIGH (ref 6–20)
CALCIUM: 8.3 mg/dL — AB (ref 8.9–10.3)
CHLORIDE: 109 mmol/L (ref 101–111)
CO2: 20 mmol/L — ABNORMAL LOW (ref 22–32)
CREATININE: 4.64 mg/dL — AB (ref 0.44–1.00)
GFR, EST AFRICAN AMERICAN: 10 mL/min — AB (ref >60)
GFR, EST NON AFRICAN AMERICAN: 8 mL/min — AB (ref >60)
Glucose, Bld: 133 mg/dL — ABNORMAL HIGH (ref 65–99)
Potassium: 3.3 mmol/L — ABNORMAL LOW (ref 3.5–5.1)
SODIUM: 139 mmol/L (ref 135–145)

## 2016-09-20 LAB — HEMOGLOBIN A1C
HEMOGLOBIN A1C: 5.5 % (ref 4.8–5.6)
Mean Plasma Glucose: 111 mg/dL

## 2016-09-20 LAB — MRSA PCR SCREENING: MRSA by PCR: POSITIVE — AB

## 2016-09-20 MED ORDER — CHLORHEXIDINE GLUCONATE CLOTH 2 % EX PADS
6.0000 | MEDICATED_PAD | Freq: Every day | CUTANEOUS | Status: AC
  Administered 2016-09-20 – 2016-09-24 (×5): 6 via TOPICAL

## 2016-09-20 MED ORDER — POTASSIUM CHLORIDE CRYS ER 20 MEQ PO TBCR
40.0000 meq | EXTENDED_RELEASE_TABLET | Freq: Once | ORAL | Status: AC
  Administered 2016-09-20: 40 meq via ORAL
  Filled 2016-09-20: qty 2

## 2016-09-20 MED ORDER — MUPIROCIN 2 % EX OINT
1.0000 "application " | TOPICAL_OINTMENT | Freq: Two times a day (BID) | CUTANEOUS | Status: AC
  Administered 2016-09-20 – 2016-09-24 (×10): 1 via NASAL
  Filled 2016-09-20 (×3): qty 22

## 2016-09-20 NOTE — Progress Notes (Signed)
RN notices swollen on patient extremities but no signs of SOB, and crackles on both lungs Patient has history of CHF, patient is on NS at 154ml/hr Paged N.P. And he ordered to change Fluids to [facility]. Will continue to monitor.

## 2016-09-20 NOTE — Progress Notes (Addendum)
Patient ID: [name], female   DOB: 1936/03/06, 80 y.o.   MRN: [id number]  PROGRESS NOTE    [name]  WUJ:[id number] DOB: 1936/03/06 DOA: 09/18/2016  PCP: Georgann HousekeeperHUSAIN,KARRAR, MD   Brief Narrative:   80 y.o. female with past medical history significant for CAD, diabetes, hypertension, systolic congestive heart failure, CKD stage III, blindness who [facility] creatinine. On 9/18 ptcreatine was 1.7 and on this admission 5.98. She was also found to have leukocytosis of 16.3. Patient has recently been treated for a UTI and pneumonia in the past month. She was started on Rocephin, Levaquin, clindamycin, Macrobid. She finished her Levaquin and clindamycin antibiotics on 09/13/2016. She started to have diarrhea and subsequently was found to have stool positive for C.diff.   Assessment & Plan:   Principal Problem:   Acute renal failure superimposed on CKD stage 3 - Baseline Cr 1.7 and on this admission 5.98 - Likely due to prerenal causes, dehydration, diarrhea, lasix - Lasix on hold  - Cr better this am - Continue IV fluids  - No acute findings on renal US   Active Problems:   Type 2 diabetes mellitus with blindness and proliferative retinopathy and diabetic nephropathy with long term insulin use - Continue current insulin regimen - CBG's in past 24 hours: 128, 113, 156    Essential hypertension - BP med (norvasc) on hold due to soft BP - BP 118/35 this am    Chronic diastolic heart failure (HCC) - Compensated  - Lasix on hold due to acute renal failure    Anemia in chronic kidney disease - Hemoglobin stable at 9.5    Hypokalemia - Due to lasix and diarrhea - Supplemented - Check BMP in am    C. difficile diarrhea / Leukocytosis - On flagyl (day 4 today)    DVT prophylaxis: Heparin subQ Code Status: DNR/DNI  Family Communication: no family at the bedside this am Disposition Plan: to ALF likely by 10/2 or 10/3     Consultants:   None   Procedures:   None   Antimicrobials:   Flagyl 09/18/2016 --> (please note pt on flagyl in ALF so today is day 4 of flagyl altogether    Subjective: No overnight events.   Objective: Vitals:   09/19/16 1502 09/19/16 2150 09/20/16 0529 09/20/16 1240  BP: (!) 107/42 (!) 101/40 (!) 133/46 (!) 118/35  Pulse: 70 71 74 69  Resp: 18 20 18 18   Temp: 97.6 F (36.4 C) 97.7 F (36.5 C) 97.6 F (36.4 C) 97.4 F (36.3 C)  TempSrc: Oral Oral Oral Oral  SpO2: 98% 99% 97% 100%  Weight:      Height:        Intake/Output Summary (Last 24 hours) at 09/20/16 1557 Last data filed at 09/20/16 1309  Gross per 24 hour  Intake          1756.34 ml  Output              200 ml  Net          1556.34 ml   Filed Weights   09/18/16 1125 09/19/16 0800  Weight: 101.2 kg (223 lb) 98.4 kg (217 lb)    Examination:  General exam: Appears calm and comfortable, no acute distress  Respiratory system: Coarse sounds, no wheezing  Cardiovascular system: S1 & S2 heard, RRR  Gastrointestinal system: (+) BS, non tender, non distended  Central nervous system: No focal neurological deficits.  Extremities: UE swelling noted, palpable pulses  Skin: No lesions or ulcers  Psychiatry: Normal mood and behavior    Data Reviewed: I have personally reviewed following labs and imaging studies  CBC:  Recent Labs Lab 09/18/16 1309 09/19/16 0553 09/20/16 0950  WBC 13.1* 10.5 10.6*  NEUTROABS 9.9*  --   --   HGB 9.7* 9.1* 9.5*  HCT 28.2* 26.9* 27.9*  MCV 92.2 91.5 92.1  PLT 274 278 277   Basic Metabolic Panel:  Recent Labs Lab 09/18/16 1309 09/19/16 0553 09/20/16 0950  NA 137 138 139  K 3.4* 3.2* 3.3*  CL 106 108 109  CO2 23 21* 20*  GLUCOSE 111* 112* 133*  BUN 71* 70* 71*  CREATININE 5.98* 5.29* 4.64*  CALCIUM 8.1* 8.0* 8.3*  MG 2.3  --   --   PHOS 4.9*  --   --    GFR: Estimated Creatinine Clearance: 11.6 mL/min (by C-G formula based on SCr of 4.64 mg/dL  (H)). Liver Function Tests:  Recent Labs Lab 09/18/16 1309  AST 60*  ALT 40  ALKPHOS 106  BILITOT 0.6  PROT 5.8*  ALBUMIN 2.4*   No results for input(s): LIPASE, AMYLASE in the last 168 hours. No results for input(s): AMMONIA in the last 168 hours. Coagulation Profile: No results for input(s): INR, PROTIME in the last 168 hours. Cardiac Enzymes: No results for input(s): CKTOTAL, CKMB, CKMBINDEX, TROPONINI in the last 168 hours. BNP (last 3 results) No results for input(s): PROBNP in the last 8760 hours. HbA1C:  Recent Labs  09/19/16 0553  HGBA1C 5.5   CBG:  Recent Labs Lab 09/19/16 1701 09/19/16 2149 09/20/16 0304 09/20/16 0732 09/20/16 1143  GLUCAP 153* 161* 128* 113* 156*   Lipid Profile: No results for input(s): CHOL, HDL, LDLCALC, TRIG, CHOLHDL, LDLDIRECT in the last 72 hours. Thyroid Function Tests: No results for input(s): TSH, T4TOTAL, FREET4, T3FREE, THYROIDAB in the last 72 hours. Anemia Panel: No results for input(s): VITAMINB12, FOLATE, FERRITIN, TIBC, IRON, RETICCTPCT in the last 72 hours. Urine analysis:    Component Value Date/Time   COLORURINE AMBER (A) 09/18/2016 1348   APPEARANCEUR CLOUDY (A) 09/18/2016 1348   LABSPEC 1.024 09/18/2016 1348   PHURINE 5.0 09/18/2016 1348   GLUCOSEU NEGATIVE 09/18/2016 1348   GLUCOSEU NEG mg/dL 16/09/9603 5409   HGBUR NEGATIVE 09/18/2016 1348   HGBUR negative 07/02/2009 1003   BILIRUBINUR SMALL (A) 09/18/2016 1348   KETONESUR NEGATIVE 09/18/2016 1348   PROTEINUR NEGATIVE 09/18/2016 1348   UROBILINOGEN 0.2 03/05/2015 1854   NITRITE NEGATIVE 09/18/2016 1348   LEUKOCYTESUR TRACE (A) 09/18/2016 1348   Sepsis Labs: @LABRCNTIP (procalcitonin:4,lacticidven:4)   ) Recent Results (from the past 240 hour(s))  MRSA PCR Screening     Status: Abnormal   Collection Time: 09/19/16  9:51 AM  Result Value Ref Range Status   MRSA by PCR POSITIVE (A) NEGATIVE Final    Comment:        The GeneXpert MRSA Assay  (FDA approved for NASAL specimens only), is one component of a comprehensive MRSA colonization surveillance program. It is not intended to diagnose MRSA infection nor to guide or monitor treatment for MRSA infections. RESULT CALLED TO, READ BACK BY AND VERIFIED WITH: G RIMANDO AT 0528 ON 10.01.2017 BY NBROOKS       Radiology Studies: Dg Chest 2 View Result Date: 09/18/2016 No active cardiopulmonary disease. No evidence of pneumonia. Stable cardiomegaly.  US Renal Result Date: 09/18/2016 Exam is somewhat limited due to body habitus. No  definite renal abnormality is noted.   Nm Pulmonary Perf And Vent Result Date: 09/18/2016 No evidence of pulmonary embolism.     Scheduled Meds: . allopurinol  100 mg Oral Q breakfast  . brimonidine  1 drop Left Eye QPC breakfast  . Chlorhexidine Gluconate Cloth  6 each Topical Q0600  . heparin  5,000 Units Subcutaneous Q12H  . insulin aspart  0-9 Units Subcutaneous TID WC  . metroNIDAZOLE  500 mg Oral Q8H  . mupirocin ointment  1 application Nasal BID  . potassium chloride  30 mEq Oral Once  . potassium chloride  40 mEq Oral Once  . QUEtiapine  25 mg Oral BID  . sodium chloride flush  3 mL Intravenous Q12H   Continuous Infusions: . sodium chloride 100 mL/hr at 09/20/16 1226     LOS: 2 days    Time spent: 25 minutes  Greater than 50% of the time spent on counseling and coordinating the care.   Manson Passey, MD [facility] Pager [phone]  If 7PM-7AM, please contact night-coverage [email or web address] Password [email or web address] 09/20/2016, 3:57 PM

## 2016-09-21 LAB — GASTROINTESTINAL PANEL BY PCR, STOOL (REPLACES STOOL CULTURE)

## 2016-09-21 LAB — CBC
HEMATOCRIT: 28.1 % — AB (ref 36.0–46.0)
Hemoglobin: 9.6 g/dL — ABNORMAL LOW (ref 12.0–15.0)
MCH: 32 pg (ref 26.0–34.0)
MCHC: 34.2 g/dL (ref 30.0–36.0)
MCV: 93.7 fL (ref 78.0–100.0)
PLATELETS: 285 10*3/uL (ref 150–400)
RBC: 3 MIL/uL — ABNORMAL LOW (ref 3.87–5.11)
RDW: 14.9 % (ref 11.5–15.5)
WBC: 11.4 10*3/uL — AB (ref 4.0–10.5)

## 2016-09-21 LAB — BASIC METABOLIC PANEL
Anion gap: 10 (ref 5–15)
BUN: 72 mg/dL — AB (ref 6–20)
CALCIUM: 8.1 mg/dL — AB (ref 8.9–10.3)
CO2: 20 mmol/L — ABNORMAL LOW (ref 22–32)
CREATININE: 3.93 mg/dL — AB (ref 0.44–1.00)
Chloride: 110 mmol/L (ref 101–111)
GFR calc Af Amer: 12 mL/min — ABNORMAL LOW (ref >60)
GFR, EST NON AFRICAN AMERICAN: 10 mL/min — AB (ref >60)
GLUCOSE: 111 mg/dL — AB (ref 65–99)
Potassium: 3.8 mmol/L (ref 3.5–5.1)
SODIUM: 140 mmol/L (ref 135–145)

## 2016-09-21 LAB — GLUCOSE, CAPILLARY
GLUCOSE-CAPILLARY: 103 mg/dL — AB (ref 65–99)
GLUCOSE-CAPILLARY: 125 mg/dL — AB (ref 65–99)
GLUCOSE-CAPILLARY: 141 mg/dL — AB (ref 65–99)

## 2016-09-21 MED ORDER — FUROSEMIDE 10 MG/ML IJ SOLN
80.0000 mg | Freq: Three times a day (TID) | INTRAMUSCULAR | Status: DC
  Administered 2016-09-21 – 2016-09-24 (×9): 80 mg via INTRAVENOUS
  Filled 2016-09-21 (×9): qty 8

## 2016-09-21 NOTE — Progress Notes (Addendum)
Patient ID: [name], female   DOB: 05-28-1936, 80 y.o.   MRN: [id number]  PROGRESS NOTE    LADAISHA VELIE  YIA:[id number] DOB: 1936/06/28 DOA: 09/18/2016  PCP: Georgann Housekeeper, MD   Brief Narrative:   80 y.o. female with past medical history significant for CAD, diabetes, hypertension, systolic congestive heart failure, CKD stage III, blindness who preseneds to ED from [facility] assisted living for elevated creatinine. On 9/18 ptcreatine was 1.7 and on this admission 5.98. She was also found to have leukocytosis of 16.3. Patient has recently been treated for a UTI and pneumonia in the past month. She was started on Rocephin, Levaquin, clindamycin, Macrobid. She finished her Levaquin and clindamycin antibiotics on 09/13/2016. She started to have diarrhea and subsequently was found to have stool positive for C.diff.   Assessment & Plan:   Principal Problem:   Acute renal failure superimposed on CKD stage 3 - Baseline Cr 1.7 and on this admission 5.98 - Likely due to prerenal causes, dehydration, diarrhea, lasix - Lasix on hold  - Cr continues to improve with IV fluids but more swelling in UE  - Appreciate renal consult and recommendations  - No acute findings on renal US   Active Problems:   Controlled type 2 diabetes mellitus with blindness and proliferative retinopathy and diabetic nephropathy with long term insulin use - Continue SSI - A1c on this admission 6.1 indicating good glycemic control - CBG's in past 24 hours: 123, 153, 103    Essential hypertension - BP at goal 120/41    Chronic diastolic heart failure (HCC) - Compensated  - Lasix on hold due to acute renal failure    Anemia in chronic kidney disease - Hemoglobin stable at 9.5, 9.6    Leukocytosis - Likely reactive  - No evidence of acute infectious process    Hypokalemia - Due to lasix and diarrhea - Supplemented and WNL    C. difficile diarrhea / Leukocytosis - On flagyl (day 4 today)    DVT  prophylaxis: Heparin subQ Code Status: DNR/DNI  Family Communication: no family at the bedside this am Disposition Plan: to ALF once Cr closer to baseline values    Consultants:   Nephrology, Dr. Arlean Hopping    Procedures:   None   Antimicrobials:   Flagyl 09/18/2016 --> (please note pt on flagyl in ALF so today is day 4 of flagyl altogether    Subjective: No overnight events.   Objective: Vitals:   09/20/16 1240 09/20/16 1559 09/20/16 2036 09/21/16 0541  BP: (!) 118/35  (!) 129/39 (!) 120/41  Pulse: 69  75 84  Resp: 18  18 19   Temp: 97.4 F (36.3 C)  97.5 F (36.4 C) 99 F (37.2 C)  TempSrc: Oral  Oral Oral  SpO2: 100%  99% 99%  Weight:  106 kg (233 lb 11 oz)  109.7 kg (241 lb 13.5 oz)  Height:  5\' 4"  (1.626 m)      Intake/Output Summary (Last 24 hours) at 09/21/16 1024 Last data filed at 09/21/16 0600  Gross per 24 hour  Intake              960 ml  Output              200 ml  Net              760 ml   Filed Weights   09/19/16 0800 09/20/16 1559 09/21/16 0541  Weight: 98.4 kg (217 lb) 106 kg (233 lb  11 oz) 109.7 kg (241 lb 13.5 oz)    Examination:  General exam: no acute distress  Respiratory system: Coarse sounds, no wheezing  Cardiovascular system: S1 & S2 heard, rate controlled  Gastrointestinal system: (+) BS, non tender, non distended  Central nervous system: Nonfocal  Extremities: UE swelling noted, palpable pulses  Skin: warm, dry  Psychiatry: Normal mood and behavior    Data Reviewed: I have personally reviewed following labs and imaging studies  CBC:  Recent Labs Lab 09/18/16 1309 09/19/16 0553 09/20/16 0950 09/21/16 0546  WBC 13.1* 10.5 10.6* 11.4*  NEUTROABS 9.9*  --   --   --   HGB 9.7* 9.1* 9.5* 9.6*  HCT 28.2* 26.9* 27.9* 28.1*  MCV 92.2 91.5 92.1 93.7  PLT 274 278 277 285   Basic Metabolic Panel:  Recent Labs Lab 09/18/16 1309 09/19/16 0553 09/20/16 0950 09/21/16 0546  NA 137 138 139 140  K 3.4* 3.2* 3.3* 3.8  CL 106  108 109 110  CO2 23 21* 20* 20*  GLUCOSE 111* 112* 133* 111*  BUN 71* 70* 71* 72*  CREATININE 5.98* 5.29* 4.64* 3.93*  CALCIUM 8.1* 8.0* 8.3* 8.1*  MG 2.3  --   --   --   PHOS 4.9*  --   --   --    GFR: Estimated Creatinine Clearance: 14.5 mL/min (by C-G formula based on SCr of 3.93 mg/dL (H)). Liver Function Tests:  Recent Labs Lab 09/18/16 1309  AST 60*  ALT 40  ALKPHOS 106  BILITOT 0.6  PROT 5.8*  ALBUMIN 2.4*   No results for input(s): LIPASE, AMYLASE in the last 168 hours. No results for input(s): AMMONIA in the last 168 hours. Coagulation Profile: No results for input(s): INR, PROTIME in the last 168 hours. Cardiac Enzymes: No results for input(s): CKTOTAL, CKMB, CKMBINDEX, TROPONINI in the last 168 hours. BNP (last 3 results) No results for input(s): PROBNP in the last 8760 hours. HbA1C:  Recent Labs  09/19/16 0553  HGBA1C 5.5   CBG:  Recent Labs Lab 09/20/16 0732 09/20/16 1143 09/20/16 1712 09/20/16 2137 09/21/16 0737  GLUCAP 113* 156* 123* 153* 103*   Lipid Profile: No results for input(s): CHOL, HDL, LDLCALC, TRIG, CHOLHDL, LDLDIRECT in the last 72 hours. Thyroid Function Tests: No results for input(s): TSH, T4TOTAL, FREET4, T3FREE, THYROIDAB in the last 72 hours. Anemia Panel: No results for input(s): VITAMINB12, FOLATE, FERRITIN, TIBC, IRON, RETICCTPCT in the last 72 hours. Urine analysis:    Component Value Date/Time   COLORURINE AMBER (A) 09/18/2016 1348   APPEARANCEUR CLOUDY (A) 09/18/2016 1348   LABSPEC 1.024 09/18/2016 1348   PHURINE 5.0 09/18/2016 1348   GLUCOSEU NEGATIVE 09/18/2016 1348   GLUCOSEU NEG mg/dL 16/10/960402/18/2009 [id number]   HGBUR NEGATIVE 09/18/2016 1348   HGBUR negative 07/02/2009 1003   BILIRUBINUR SMALL (A) 09/18/2016 1348   KETONESUR NEGATIVE 09/18/2016 1348   PROTEINUR NEGATIVE 09/18/2016 1348   UROBILINOGEN 0.2 03/05/2015 1854   NITRITE NEGATIVE 09/18/2016 1348   LEUKOCYTESUR TRACE (A) 09/18/2016 1348   Sepsis  Labs: @LABRCNTIP (procalcitonin:4,lacticidven:4)   Recent Results (from the past 240 hour(s))  MRSA PCR Screening     Status: Abnormal   Collection Time: 09/19/16  9:51 AM  Result Value Ref Range Status   MRSA by PCR POSITIVE (A) NEGATIVE Final      Radiology Studies: Dg Chest 2 View Result Date: 09/18/2016 No active cardiopulmonary disease. No evidence of pneumonia. Stable cardiomegaly.  [place] Renal Result Date: 09/18/2016 Exam is somewhat  limited due to body habitus. No definite renal abnormality is noted.   Nm Pulmonary Perf And Vent Result Date: 09/18/2016 No evidence of pulmonary embolism.     Scheduled Meds: . allopurinol  100 mg Oral Q breakfast  . brimonidine  1 drop Left Eye QPC breakfast  . Chlorhexidine Gluconate Cloth  6 each Topical Q0600  . heparin  5,000 Units Subcutaneous Q12H  . insulin aspart  0-9 Units Subcutaneous TID WC  . metroNIDAZOLE  500 mg Oral Q8H  . mupirocin ointment  1 application Nasal BID  . potassium chloride  30 mEq Oral Once  . QUEtiapine  25 mg Oral BID  . sodium chloride flush  3 mL Intravenous Q12H   Continuous Infusions: . sodium chloride 50 mL/hr at 09/20/16 1630     LOS: 3 days    Time spent: 25 minutes  Greater than 50% of the time spent on counseling and coordinating the care.   Manson Passey, MD [facility] Pager [phone]  If 7PM-7AM, please contact night-coverage [email or web address] Password [email or web address] 09/21/2016, 10:24 AM

## 2016-09-21 NOTE — NC FL2 (Signed)
[place] MEDICAID FL2 LEVEL OF CARE SCREENING TOOL     IDENTIFICATION  Patient Name: [name] Birthdate: August 16, 1936 Sex: female Admission Date (Current Location): 09/18/2016  [facility] and [place] Number:  Producer, television/film/video and Address:  [facility],  [address], [place] [address]      Provider Number: [id number]  Attending Physician Name and Address:  Alison Murray, MD  Relative Name and Phone Number:       Current Level of Care: SNF Recommended Level of Care: Skilled Nursing Facility Prior Approval Number:    Date Approved/Denied:   PASRR Number:    Discharge Plan: SNF    Current Diagnoses: Patient Active Problem List   Diagnosis Date Noted  . Acute renal failure superimposed on stage 3 chronic kidney disease (HCC)   . Hypokalemia   . C. difficile diarrhea 09/18/2016  . Leukocytosis 09/18/2016  . Acute renal failure (ARF) (HCC) 09/18/2016  . Anemia in stage 3 chronic kidney disease 11/06/2014  . Acute encephalopathy 11/05/2014  . Diabetes (HCC) 10/04/2014  . Type II or unspecified type diabetes mellitus with renal manifestations, uncontrolled(250.42) 10/13/2013  . Gout, unspecified 09/14/2013  . Urinary tract infection, site not specified 08/31/2013  . Edema 07/06/2013  . Encounter for long-term (current) use of other medications 07/06/2013  . Anemia in chronic kidney disease 07/05/2013  . 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 Type II or unspecified type diabetes mellitus with renal manifestations, not stated as uncontrolled(250.40) 06/02/2013  . Unspecified constipation 04/20/2013  . Psychosis 04/20/2013  . Right ankle sprain 12/30/2012  . Edema of foot 12/28/2012  . Acute on chronic renal failure (HCC) 12/27/2012  . Nonischemic cardiomyopathy (HCC) 12/27/2012  . Dysuria 12/27/2012  . UTI (lower urinary tract infection) 09/10/2012  . Chronic diastolic congestive heart failure (HCC) 04/01/2012  . OSA  (obstructive sleep apnea) 03/04/2012  . Preventative health care 11/19/2011  . Insomnia 08/04/2011  . Boil of buttock 08/04/2011  . Visual hallucinations 07/15/2011  . Proliferative diabetic retinopathy (HCC) 01/28/2009  . GLAUCOMA 12/06/2008  . Coronary atherosclerosis 10/19/2008  . ALLERGIC RHINITIS 01/03/2008  . Stage III chronic kidney disease 01/10/2007  . 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 HYPERLIPIDEMIA 12/27/2006  . Essential hypertension 12/27/2006  . Chronic diastolic heart failure (HCC) 12/27/2006    Orientation RESPIRATION BLADDER Height & Weight     Self, Time, Situation, Place  Normal Incontinent Weight: 241 lb 13.5 oz (109.7 kg) Height:  5\' 4"  (162.6 cm)  BEHAVIORAL SYMPTOMS/MOOD NEUROLOGICAL BOWEL NUTRITION STATUS      Incontinent Diet  AMBULATORY STATUS COMMUNICATION OF NEEDS Skin   Extensive Assist Verbally Normal                       Personal Care Assistance Level of Assistance  Bathing, Feeding, Dressing Bathing Assistance: Limited assistance Feeding assistance: Limited assistance Dressing Assistance: Limited assistance     Functional Limitations Info  Sight, Hearing, Speech Sight Info: Impaired Hearing Info: Adequate Speech Info: Adequate    SPECIAL CARE FACTORS FREQUENCY  PT (By licensed PT)     PT Frequency: 5              Contractures Contractures Info: Not present    Additional Factors Info  Code Status, Isolation Precautions, Allergies Code Status Info: DNR Allergies Info: Heparin, Lisinopril     Isolation Precautions Info: OMDRO     Current Medications (09/21/2016):  This is the current hospital active medication list Current Facility-Administered Medications  Medication Dose Route Frequency Provider Last Rate Last Dose  . acetaminophen (TYLENOL) tablet 650 mg  650 mg Oral Q6H PRN Clanford Cyndie MullL Johnson, MD   650 mg at 09/19/16 [id number]   Or  . acetaminophen (TYLENOL)  suppository 650 mg  650 mg Rectal Q6H PRN Clanford Cyndie MullL Johnson, MD      . allopurinol (ZYLOPRIM) tablet 100 mg  100 mg Oral Q breakfast Clanford Cyndie MullL Johnson, MD   100 mg at 09/21/16 0847  . brimonidine (ALPHAGAN) 0.2 % ophthalmic solution 1 drop  1 drop Left Eye QPC breakfast Clanford Cyndie MullL Johnson, MD   1 drop at 09/21/16 0851  . Chlorhexidine Gluconate Cloth 2 % PADS 6 each  6 each Topical Q0600 Alison MurrayAlma M Devine, MD   6 each at 09/21/16 0600  . furosemide (LASIX) injection 80 mg  80 mg Intravenous Q8H Delano Metzobert Schertz, MD      . heparin injection 5,000 Units  5,000 Units Subcutaneous Q12H Clanford Cyndie MullL Johnson, MD   5,000 Units at 09/21/16 1036  . insulin aspart (novoLOG) injection 0-9 Units  0-9 Units Subcutaneous TID WC Clanford Cyndie MullL Johnson, MD   1 Units at 09/21/16 1335  . metroNIDAZOLE (FLAGYL) tablet 500 mg  500 mg Oral Q8H Clanford L Johnson, MD   500 mg at 09/21/16 1357  . mupirocin ointment (BACTROBAN) 2 % 1 application  1 application Nasal BID Alison MurrayAlma M Devine, MD   1 application at 09/21/16 1036  . ondansetron (ZOFRAN) tablet 4 mg  4 mg Oral Q6H PRN Clanford Cyndie MullL Johnson, MD       Or  . ondansetron (ZOFRAN) injection 4 mg  4 mg Intravenous Q6H PRN Clanford L Johnson, MD      . potassium chloride (K-DUR,KLOR-CON) CR tablet 30 mEq  30 mEq Oral Once Clanford Cyndie MullL Johnson, MD      . QUEtiapine (SEROQUEL) tablet 25 mg  25 mg Oral BID Clanford Cyndie MullL Johnson, MD   25 mg at 09/20/16 2135  . senna-docusate (Senokot-S) tablet 1 tablet  1 tablet Oral QHS PRN Clanford L Johnson, MD      . sodium chloride flush (NS) 0.9 % injection 3 mL  3 mL Intravenous Q12H Clanford L Johnson, MD   3 mL at 09/21/16 1037  . technetium TC 75M diethylenetriame-pentaacetic acid (DTPA) injection 33 millicurie  33 millicurie Intravenous Once PRN Loralie ChampagneMark Gallerani, MD         Discharge Medications: Please see discharge summary for a list of discharge medications.  Relevant Imaging Results:  Relevant Lab Results:   Additional Information     Clearance CootsNicole A Oma Alpert, LCSW

## 2016-09-21 NOTE — Consult Note (Addendum)
Renal Service Consult Note [facility]  JAMEIA MAKRIS 09/21/2016 Kortlynn Poust D Requesting Physician:  Dr. Elisabeth Pigeon  Reason for Consult:  Acute on chron renal HPI: The patient is a 80 y.o. year-old with history of CKD3, syst CHF, IDDM, schizophrenia, blindness, DM w retinopathy, OSA, NICM, HTN, HL, glaucoma and CAD presented on 9/29 with diarrhea, abd pain and creat 5.0.  With fluids creat is down to  3.93 today. Asked to see for A/ C renal failure. Renal US was unremarkable. UA showed 6-30 wbc and epi's, 0-5 rbc, neg protein.  Creat now is down to 3.9 but developing edema w IVF's.    Was treated for PNA and UTI w abx in the last month.  Finished abx on 09/13/16.  Stool here is + for Cdif  Patient denies nsaid use, SOB, cough or CP.  Does have poor appetite and some jerking or UE's. No N/V.   Baseline creat around 1.7.   Lives in a SNF for the last 4 yrs.  Nonambulatory I believe.       Chart review: Jun '07 - CHF/ NICM EF 45%, HTN, CKD 3, IDDM 2 , gout, HTN Feb '11 - CP/ SOB, dx NICM EF 30%, HL, DM w retinal detach/ R eye blind, glaucoma, HL, HTN, obesity, chron LE edema, CKD III Apr '12 - AKI, resovling, noncard CP, NICM , DM2, HTN, blind R eye, chron LE edema Mar '13 - a/c syst CHF, cough, GERD, anemia, DM2, CKD Sept '13 - AMS, AKI, DM2, OSA, HTN, HL, CAD, gen'd weakness, blind R eye/ glaucoma/ cataracts Jan '14 - sp fall w R ankle pain/ swelling, AKI, CKD III, DM2, OSA, HL, HTN, NICM, hx CAD, HTN Nov '15 - UTI, AMS< DM, CKD 3, HTN    ROS  denies CP  no joint pain   no HA  no blurry vision  no nausea/ vomiting  no dysuria  no difficulty voiding  no change in urine color    Past Medical History  Past Medical History:  Diagnosis Date  . Anxiety   . Arthritis   . Blood transfusion without reported diagnosis   . Chronic diastolic CHF (congestive heart failure) (HCC)   . Coronary artery disease    LHC 05/25/06: No renal artery stenosis, proximal LAD  40%, proximal D1 40%  . Diabetes mellitus    a1c 7.3 in 10/2011  . GERD (gastroesophageal reflux disease)   . GLAUCOMA 12/06/2008   Vision Loss right eye   . HYPERLIPIDEMIA 12/27/2006  . Hypertension   . NICM (nonischemic cardiomyopathy) (HCC)   . OSA (obstructive sleep apnea) 03/04/2012  . Proliferative diabetic retinopathy 01/28/2009   Severe Disease with Near Complete Blindness.    . Schizophrenia (HCC)   . Stage III chronic kidney disease 01/10/2007  . Systolic heart failure (HCC)    Echo 10/09: EF 30-35%. Echo 2/13: EF 50-55%.  . Visual hallucinations 07/15/2011   Past Surgical History  Past Surgical History:  Procedure Laterality Date  . CORONARY ANGIOPLASTY WITH STENT PLACEMENT    . EYE SURGERY  2012   both eyes   Family History  Family History  Problem Relation Age of Onset  . Heart disease Maternal Grandmother   . Prostate cancer Father   . Colon cancer Maternal Aunt    Social History  reports that she has never smoked. She has never used smokeless tobacco. She reports that she does not drink alcohol or use drugs. Allergies  Allergies  Allergen Reactions  .  Heparin Other (See Comments)    hallucinations    . Lisinopril Cough   Home medications Prior to Admission medications   Medication Sig Start Date End Date Taking? Authorizing Provider  allopurinol (ZYLOPRIM) 100 MG tablet Take 200 mg by mouth daily with breakfast.    Yes Historical Provider, MD  Amino Acids-Protein Hydrolys (FEEDING SUPPLEMENT, PRO-STAT SUGAR FREE 64,) LIQD Take 30 mLs by mouth 2 (two) times daily.   Yes Historical Provider, MD  amLODipine (NORVASC) 5 MG tablet Take 5 mg by mouth daily with breakfast.    Yes Historical Provider, MD  atorvastatin (LIPITOR) 20 MG tablet Take 1 tablet (20 mg total) by mouth daily. Patient taking differently: Take 20 mg by mouth daily with breakfast.  12/30/12  Yes Ignacia Palma, MD  brimonidine (ALPHAGAN) 0.2 % ophthalmic solution Place 1 drop into the left eye  daily after breakfast.    Yes Historical Provider, MD  carvedilol (COREG) 25 MG tablet Take 1 tablet (25 mg total) by mouth 2 (two) times daily with a meal. 04/08/12  Yes Denna Haggard, MD  Cranberry 450 MG TABS Take 1 tablet by mouth 2 (two) times daily.   Yes Historical Provider, MD  docusate sodium 100 MG CAPS Take 100 mg by mouth 2 (two) times daily. 09/13/12  Yes Emory Nani Skillern, MD  ergocalciferol (VITAMIN D2) 50000 UNITS capsule Take 50,000 Units by mouth every Wednesday.    Yes Historical Provider, MD  furosemide (LASIX) 20 MG tablet Take 20 mg by mouth 2 (two) times daily.   Yes Historical Provider, MD  hydrALAZINE (APRESOLINE) 50 MG tablet Take 50 mg by mouth 3 (three) times daily.   Yes Historical Provider, MD  insulin glargine (LANTUS) 100 UNIT/ML injection Inject 12 Units into the skin at bedtime.  12/30/12  Yes Ignacia Palma, MD  lamoTRIgine (LAMICTAL) 25 MG tablet Take 25 mg by mouth at bedtime.   Yes Historical Provider, MD  metroNIDAZOLE (FLAGYL) 500 MG tablet Take 500 mg by mouth 3 (three) times daily. Started 09/29 for 14 days   Yes Historical Provider, MD  Multiple Vitamins-Minerals (CERTAGEN PO) Take 1 tablet by mouth daily after breakfast.   Yes Historical Provider, MD  Nutritional Supplements (RESOURCE 2.0) LIQD Take 1 Can by mouth 2 (two) times daily.   Yes Historical Provider, MD  Probiotic Product (PROBIOTIC DAILY) CAPS Take 1 capsule by mouth 2 (two) times daily. Started 09/10 for 30 days   Yes Historical Provider, MD  QUEtiapine (SEROQUEL) 25 MG tablet Take 25 mg by mouth 2 (two) times daily.   Yes Historical Provider, MD  sennosides-docusate sodium (SENOKOT-S) 8.6-50 MG tablet Take 2 tablets by mouth daily with breakfast.    Yes Historical Provider, MD  vitamin C (ASCORBIC ACID) 500 MG tablet Take 500 mg by mouth daily after breakfast.   Yes Historical Provider, MD   Liver Function Tests  Recent Labs Lab 09/18/16 1309  AST 60*  ALT 40  ALKPHOS 106  BILITOT 0.6   PROT 5.8*  ALBUMIN 2.4*   No results for input(s): LIPASE, AMYLASE in the last 168 hours. CBC  Recent Labs Lab 09/18/16 1309 09/19/16 0553 09/20/16 0950 09/21/16 0546  WBC 13.1* 10.5 10.6* 11.4*  NEUTROABS 9.9*  --   --   --   HGB 9.7* 9.1* 9.5* 9.6*  HCT 28.2* 26.9* 27.9* 28.1*  MCV 92.2 91.5 92.1 93.7  PLT 274 278 277 285   Basic Metabolic Panel  Recent Labs Lab 09/18/16  1309 09/19/16 0553 09/20/16 0950 09/21/16 0546  NA 137 138 139 140  K 3.4* 3.2* 3.3* 3.8  CL 106 108 109 110  CO2 23 21* 20* 20*  GLUCOSE 111* 112* 133* 111*  BUN 71* 70* 71* 72*  CREATININE 5.98* 5.29* 4.64* 3.93*  CALCIUM 8.1* 8.0* 8.3* 8.1*  PHOS 4.9*  --   --   --    Iron/TIBC/Ferritin/ %Sat    Component Value Date/Time   IRON 31 (L) 02/27/2012 0013   TIBC 213 (L) 02/27/2012 0013   FERRITIN 107 02/27/2012 0013   IRONPCTSAT 15 (L) 02/27/2012 0013    Vitals:   09/20/16 1240 09/20/16 1559 09/20/16 2036 09/21/16 0541  BP: (!) 118/35  (!) 129/39 (!) 120/41  Pulse: 69  75 84  Resp: 18  18 19   Temp: 97.4 F (36.3 C)  97.5 F (36.4 C) 99 F (37.2 C)  TempSrc: Oral  Oral Oral  SpO2: 100%  99% 99%  Weight:  106 kg (233 lb 11 oz)  109.7 kg (241 lb 13.5 oz)  Height:  5\' 4"  (1.626 m)     Exam Gen somnolent, arouses easily, no distress, obese AAF No rash, cyanosis or gangrene Sclera anicteric, throat clear  No jvd or bruits Chest clear bilat, dec'd at bases RRR no MRG Abd soft ntnd no mass or ascites +bs, +abd wall edema GU defer MS no joint effusions or deformity Ext 1-2+ diffuse bilat LE edema / no wounds or ulcers Neuro is alert, Ox 3 , nf, mild asterixis  CXR 9/29 was clear   Renal US was unremarkable UA showed 6-30 wbc and epi's, 0-5 rbc, neg protein.            Na 140  K 3.8  BUN 72  Cr 3.93   Ca 8.1  WBC 11  Hb 9.6  Plt 285  Alb 2.4   LFT"s ok  Tprot 5.8  Home meds > Lamictal, flagyl, MVI, probiotic, vit C, Allopurinol, Norvasc, Lipitor, Coreg, colace, Lasix 20 bid,  Hydralazine 50 tid, Lantus 12 u SQ qhs, Seroquel bid, senokot -S  Assessment: 1. Acute on CKD 3 - baseline creat 1.7, creat 5 on admit and 3.9 today after IVF"s, but now is edematous.  Creat down some.  Mild uremic symptoms w somnolence and mild asterixis.  Not a dialysis candidate due to sig comorbidities, have d/w daughter and pt.  Medical Rx.  Will stop IVF and start IV lasix given what appears to be volume overload 2. Cdif assoc diarrhea - sp recent abx, on po flagyl 3. Vol - edematous as above 4. NICM EF 30-35% 5. Morbid obesity 6. DM 2 w complications 7. Blind R eye 8. HTN - BP meds on hold, BP's good 9. Debility / SNF dependent x 4 yrs    Plan - as above, IV lasix, dc fluids.  Vinson Moselleob Hannie Shoe MD [facility] pager [phone]   09/21/2016, 2:53 PM

## 2016-09-21 NOTE — Evaluation (Signed)
Physical Therapy Evaluation Patient Details Name: [name] MRN: [id number] DOB: 1936/08/23 Today's Date: 09/21/2016   History of Present Illness  80 y.o. female admitted with c diff, ARF. Pt is from SNF. PMH of DM, blindness, CHF, CKD, recent UTI and PNA.   Clinical Impression  Pt admitted with above diagnosis. Pt currently with functional limitations due to the deficits listed below (see PT Problem List). Pt unable to stand with +2 assist, will need mechanical lift for OOB.  Pt will benefit from skilled PT to increase their independence and safety with mobility to allow discharge to the venue listed below.       Follow Up Recommendations SNF;Supervision/Assistance - 24 hour    Equipment Recommendations  None recommended by PT    Recommendations for Other Services       Precautions / Restrictions Precautions Precautions: Fall Precaution Comments: non-ambulatory at baseline Restrictions Weight Bearing Restrictions: No      Mobility  Bed Mobility Overal bed mobility: Needs Assistance;+2 for physical assistance Bed Mobility: Rolling;Supine to Sit Rolling: Mod assist   Supine to sit: +2 for physical assistance;Max assist     General bed mobility comments: max A to raise trunk (pt 40%)  Transfers Overall transfer level: Needs assistance Equipment used: Rolling walker (2 wheeled) Transfers: Sit to/from Stand Sit to Stand: +2 physical assistance;+2 safety/equipment;From elevated surface         General transfer comment: attempted sit to stand from elevated bed with +2 assist, pulling up on RW, pt unable to clear hips off of bed to achieve full upright position, BLEs trembling in attempted stand, returned to sit, will need lift for OOB  Ambulation/Gait             General Gait Details: nonambulatory at baseline  Stairs            Wheelchair Mobility    Modified Rankin (Stroke Patients Only)       Balance Overall balance assessment: Needs  assistance Sitting-balance support: Feet supported Sitting balance-Leahy Scale: Fair       Standing balance-Leahy Scale: Zero                               Pertinent Vitals/Pain Pain Assessment: No/denies pain    Home Living Family/patient expects to be discharged to:: Skilled nursing facility                 Additional [facility]    Prior Function Level of Independence: Needs assistance   Gait / Transfers [facility], pt reports she helps to pull up on lift, uses diapers  ADL's / Homemaking Assistance Needed: dependent  Comments: pt stated she can "see a little" out of L eye     Hand Dominance   Dominant Hand: Right    Extremity/Trunk Assessment   Upper Extremity Assessment: Generalized weakness (edema BUEs, can raise arms against gravity, couldn't hold a cup possibly due to edema)           Lower Extremity Assessment: Generalized weakness      Cervical / Trunk Assessment: Normal  Communication   Communication: HOH  Cognition Arousal/Alertness: Awake/alert Behavior During Therapy: WFL for tasks assessed/performed Overall Cognitive Status: Within Functional Limits for tasks assessed                      General Comments  Exercises General Exercises - Lower Extremity Ankle Circles/Pumps: AROM;Both;20 reps;Supine Heel Slides: AAROM;Both;10 reps;Supine Hip ABduction/ADduction: AAROM;Both;10 reps;Supine   Assessment/Plan    PT Assessment Patient needs continued PT services  PT Problem List Decreased strength;Decreased activity tolerance;Decreased balance;Decreased mobility          PT Treatment Interventions Functional mobility training;Therapeutic activities;Therapeutic exercise;Balance training;Patient/family education    PT Goals (Current goals can be found in the Care Plan section)  Acute Rehab PT Goals Patient Stated Goal: go to church and Bible  study at SNF PT Goal Formulation: With patient Time For Goal Achievement: 10/05/16 Potential to Achieve Goals: Fair    Frequency Min 2X/week   Barriers to discharge        Co-evaluation               End of Session   Activity Tolerance: Patient limited by fatigue Patient left: in bed;with call bell/phone within reach Nurse Communication: Need for lift equipment         Time: 3235-5732 PT Time Calculation (min) (ACUTE ONLY): 34 min   Charges:   PT Evaluation $PT Eval Moderate Complexity: 1 Procedure PT Treatments $Therapeutic Activity: 8-22 mins   PT G Codes:        Tamala Ser 09/21/2016, 12:56 PM [phone]

## 2016-09-22 LAB — BASIC METABOLIC PANEL
ANION GAP: 7 (ref 5–15)
BUN: 65 mg/dL — AB (ref 6–20)
CO2: 21 mmol/L — AB (ref 22–32)
Calcium: 8.3 mg/dL — ABNORMAL LOW (ref 8.9–10.3)
Chloride: 112 mmol/L — ABNORMAL HIGH (ref 101–111)
Creatinine, Ser: 3.46 mg/dL — ABNORMAL HIGH (ref 0.44–1.00)
GFR calc Af Amer: 14 mL/min — ABNORMAL LOW (ref >60)
GFR, EST NON AFRICAN AMERICAN: 12 mL/min — AB (ref >60)
GLUCOSE: 115 mg/dL — AB (ref 65–99)
POTASSIUM: 4.6 mmol/L (ref 3.5–5.1)
Sodium: 140 mmol/L (ref 135–145)

## 2016-09-22 LAB — CBC
HCT: 27.6 % — ABNORMAL LOW (ref 36.0–46.0)
HEMOGLOBIN: 9.5 g/dL — AB (ref 12.0–15.0)
MCH: 31.3 pg (ref 26.0–34.0)
MCHC: 34.4 g/dL (ref 30.0–36.0)
MCV: 90.8 fL (ref 78.0–100.0)
PLATELETS: 277 10*3/uL (ref 150–400)
RBC: 3.04 MIL/uL — AB (ref 3.87–5.11)
RDW: 15.1 % (ref 11.5–15.5)
WBC: 11 10*3/uL — ABNORMAL HIGH (ref 4.0–10.5)

## 2016-09-22 LAB — GLUCOSE, CAPILLARY
GLUCOSE-CAPILLARY: 107 mg/dL — AB (ref 65–99)
Glucose-Capillary: 142 mg/dL — ABNORMAL HIGH (ref 65–99)
Glucose-Capillary: 137 mg/dL — ABNORMAL HIGH (ref 65–99)
Glucose-Capillary: 145 mg/dL — ABNORMAL HIGH (ref 65–99)

## 2016-09-22 NOTE — Progress Notes (Signed)
Patient ID: [name], female   DOB: 04/26/1936, 80 y.o.   MRN: [id number]  PROGRESS NOTE    [name]  HYQ:[id number] DOB: 04/26/1936 DOA: 09/18/2016  PCP: Georgann HousekeeperHUSAIN,KARRAR, MD   Brief Narrative:   80 y.o. female with past medical history significant for CAD, diabetes, hypertension, systolic congestive heart failure, CKD stage III, blindness who [facility] for elevated creatinine. On 9/18 ptcreatine was 1.7 and on this admission 5.98. She was also found to have leukocytosis of 16.3. Patient has recently been treated for a UTI and pneumonia in the past month. She was started on Rocephin, Levaquin, clindamycin, Macrobid. She finished her Levaquin and clindamycin antibiotics on 09/13/2016. She started to have diarrhea and subsequently was found to have stool positive for C.diff.   Assessment & Plan:   Principal Problem:   Acute renal failure superimposed on CKD stage 3 - Baseline Cr 1.7 and on this admission 5.98 - Likely due to prerenal causes, dehydration, diarrhea, lasix. Lasix initially on hold  - Cr continues to improve with IV fluids but she is now edematous - Appreciate nephrology input, recommendation is to start patient on Lasix and stop IV fluids. Lasix started 09/21/2016 80 mg IV Q 8 hours  - No acute findings on renal US   Active Problems:   Controlled type 2 diabetes mellitus with blindness and proliferative retinopathy and diabetic nephropathy with long term insulin use - Continue SSI - A1c on this admission 6.1 indicating good glycemic control - CBG's in past 24 hours:141, 107, 142    Essential hypertension - BP controlled     Chronic diastolic heart failure (HCC) - Compensated  - Resume lasix per renal     Anemia in chronic kidney disease - Hemoglobin stable at 9.5    Leukocytosis - Likely reactive  - No evidence of acute infectious process - Check CBC in am    Hypokalemia - Due to lasix and diarrhea -  Supplemented and WNL    C. difficile diarrhea / Leukocytosis - Continue flagyl (started 9/29)    DVT prophylaxis: Heparin subQ Code Status: DNR/DNI  Family Communication: no family at the bedside this am Disposition Plan: to ALF once Cr closer to baseline values    Consultants:   Nephrology, Dr. Arlean HoppingSchertz    Procedures:   None   Antimicrobials:   Flagyl 09/18/2016 -->    Subjective: No overnight events.   Objective: Vitals:   09/21/16 0541 09/21/16 1500 09/21/16 2209 09/22/16 0602  BP: (!) 120/41 (!) 104/37 (!) 130/40 (!) 130/32  Pulse: 84 79 87 80  Resp: 19 20 18 18   Temp: 99 F (37.2 C) 98.3 F (36.8 C) 99.5 F (37.5 C) 98.1 F (36.7 C)  TempSrc: Oral Oral Oral Oral  SpO2: 99% 96% 100% 100%  Weight: 109.7 kg (241 lb 13.5 oz)   105.3 kg (232 lb 2.3 oz)  Height:        Intake/Output Summary (Last 24 hours) at 09/22/16 0855 Last data filed at 09/21/16 2212  Gross per 24 hour  Intake              123 ml  Output               75 ml  Net               48 ml   Filed Weights   09/20/16 1559 09/21/16 0541 09/22/16 0602  Weight: 106 kg (233 lb 11  oz) 109.7 kg (241 lb 13.5 oz) 105.3 kg (232 lb 2.3 oz)    Examination:  General exam: no acute distress, calm and comfortable  Respiratory system: Coarse sounds, no wheezing  Cardiovascular system: S1 & S2 heard, RRR Gastrointestinal system: (+) BS, non tender, non distended  Central nervous system: No focal deficits  Extremities: UE swelling noted, palpable pulses  Skin: no lesions or ulcers  Psychiatry: Normal mood and behavior    Data Reviewed: I have personally reviewed following labs and imaging studies  CBC:  Recent Labs Lab 09/18/16 1309 09/19/16 0553 09/20/16 0950 09/21/16 0546 09/22/16 0608  WBC 13.1* 10.5 10.6* 11.4* 11.0*  NEUTROABS 9.9*  --   --   --   --   HGB 9.7* 9.1* 9.5* 9.6* 9.5*  HCT 28.2* 26.9* 27.9* 28.1* 27.6*  MCV 92.2 91.5 92.1 93.7 90.8  PLT 274 278 277 285 277   Basic  Metabolic Panel:  Recent Labs Lab 09/18/16 1309 09/19/16 0553 09/20/16 0950 09/21/16 0546  NA 137 138 139 140  K 3.4* 3.2* 3.3* 3.8  CL 106 108 109 110  CO2 23 21* 20* 20*  GLUCOSE 111* 112* 133* 111*  BUN 71* 70* 71* 72*  CREATININE 5.98* 5.29* 4.64* 3.93*  CALCIUM 8.1* 8.0* 8.3* 8.1*  MG 2.3  --   --   --   PHOS 4.9*  --   --   --    GFR: Estimated Creatinine Clearance: 14.2 mL/min (by C-G formula based on SCr of 3.93 mg/dL (H)). Liver Function Tests:  Recent Labs Lab 09/18/16 1309  AST 60*  ALT 40  ALKPHOS 106  BILITOT 0.6  PROT 5.8*  ALBUMIN 2.4*   No results for input(s): LIPASE, AMYLASE in the last 168 hours. No results for input(s): AMMONIA in the last 168 hours. Coagulation Profile: No results for input(s): INR, PROTIME in the last 168 hours. Cardiac Enzymes: No results for input(s): CKTOTAL, CKMB, CKMBINDEX, TROPONINI in the last 168 hours. BNP (last 3 results) No results for input(s): PROBNP in the last 8760 hours. HbA1C: No results for input(s): HGBA1C in the last 72 hours. CBG:  Recent Labs Lab 09/20/16 2137 09/21/16 0737 09/21/16 1207 09/21/16 1750 09/22/16 0754  GLUCAP 153* 103* 125* 141* 107*   Lipid Profile: No results for input(s): CHOL, HDL, LDLCALC, TRIG, CHOLHDL, LDLDIRECT in the last 72 hours. Thyroid Function Tests: No results for input(s): TSH, T4TOTAL, FREET4, T3FREE, THYROIDAB in the last 72 hours. Anemia Panel: No results for input(s): VITAMINB12, FOLATE, FERRITIN, TIBC, IRON, RETICCTPCT in the last 72 hours. Urine analysis:    Component Value Date/Time   COLORURINE AMBER (A) 09/18/2016 1348   APPEARANCEUR CLOUDY (A) 09/18/2016 1348   LABSPEC 1.024 09/18/2016 1348   PHURINE 5.0 09/18/2016 1348   GLUCOSEU NEGATIVE 09/18/2016 1348   GLUCOSEU NEG mg/dL 16/09/9603 5409   HGBUR NEGATIVE 09/18/2016 1348   HGBUR negative 07/02/2009 1003   BILIRUBINUR SMALL (A) 09/18/2016 1348   KETONESUR NEGATIVE 09/18/2016 1348   PROTEINUR  NEGATIVE 09/18/2016 1348   UROBILINOGEN 0.2 03/05/2015 1854   NITRITE NEGATIVE 09/18/2016 1348   LEUKOCYTESUR TRACE (A) 09/18/2016 1348   Sepsis Labs: @LABRCNTIP (procalcitonin:4,lacticidven:4)   Recent Results (from the past 240 hour(s))  MRSA PCR Screening     Status: Abnormal   Collection Time: 09/19/16  9:51 AM  Result Value Ref Range Status   MRSA by PCR POSITIVE (A) NEGATIVE Final      Radiology Studies: Dg Chest 2 View Result Date: 09/18/2016  No active cardiopulmonary disease. No evidence of pneumonia. Stable cardiomegaly.  US Renal Result Date: 09/18/2016 Exam is somewhat limited due to body habitus. No definite renal abnormality is noted.   Nm Pulmonary Perf And Vent Result Date: 09/18/2016 No evidence of pulmonary embolism.     Scheduled Meds: . allopurinol  100 mg Oral Q breakfast  . brimonidine  1 drop Left Eye QPC breakfast  . Chlorhexidine Gluconate Cloth  6 each Topical Q0600  . furosemide  80 mg Intravenous Q8H  . heparin  5,000 Units Subcutaneous Q12H  . insulin aspart  0-9 Units Subcutaneous TID WC  . metroNIDAZOLE  500 mg Oral Q8H  . mupirocin ointment  1 application Nasal BID  . potassium chloride  30 mEq Oral Once  . QUEtiapine  25 mg Oral BID  . sodium chloride flush  3 mL Intravenous Q12H   Continuous Infusions:     LOS: 4 days    Time spent: 15 minutes  Greater than 50% of the time spent on counseling and coordinating the care.   Manson Passey, MD [facility] Pager [phone]  If 7PM-7AM, please contact night-coverage [email or web address] Password [email or web address] 09/22/2016, 8:55 AM

## 2016-09-22 NOTE — Clinical Social Work Note (Signed)
Clinical Social Work Assessment  Patient Details  Name: [name] MRN: [id number] Date of Birth: 02/14/1936  Date of referral:  09/22/16               Reason for consult:  Facility Placement                Permission sought to share information with:  Facility Art therapist granted to share information::     Name::        Agency::  [facility]  Relationship::  60, Daughter  Contact Information:  [phone], [phone],   Housing/Transportation Living arrangements for the past 2 months:  [place] of Information:  Patient Patient Interpreter Needed:  None Criminal Activity/Legal Involvement Pertinent to Current Situation/Hospitalization:  No - Comment as needed Significant Relationships:  Adult Children Lives with:  Self Do you feel safe going back to the place where you live?  Yes Need for family participation in patient care:  Yes (Comment)  Care giving concerns: No concerns presented by patient or family at this time.   Social Worker assessment / plan:   LCSWA met with patient, son-[name] and daughter [name] at bedside. Patient reports she will return to [facility]. Family is agreeable.  LCSWA will assist with disposition.  [facility] aware of patient return.     Employment status:  Retired, Disabled (Comment on whether or not currently receiving Disability) Insurance information:  Managed Medicare PT Recommendations:  Not assessed at this time Information / Referral to community resources:  [place]  Patient/Family's Response to care:  Agreeable.  Patient/Family's Understanding of and Emotional Response to Diagnosis, Current Treatment, and Prognosis: Family understands the patient will continue to get care until medically stable for discharge.   Emotional Assessment Appearance:  Appears stated age Attitude/Demeanor/Rapport:    Affect (typically observed):  Accepting Orientation:   Oriented to Self, Oriented to Place, Oriented to Situation Alcohol / Substance use:  Not Applicable Psych involvement (Current and /or in the community):  No (Comment)  Discharge Needs  Concerns to be addressed:  Discharge Planning Concerns Readmission within the last 30 days:  No Current discharge risk:  None Barriers to Discharge:  Continued Medical Work up   [employer], LCSW 09/22/2016, 11:29 AM

## 2016-09-22 NOTE — Progress Notes (Signed)
[facility] Progress Note   Subjective: foley in now draining clear urine, minimal UOP overnight recorded  Vitals:   09/21/16 0541 09/21/16 1500 09/21/16 2209 09/22/16 0602  BP: (!) 120/41 (!) 104/37 (!) 130/40 (!) 130/32  Pulse: 84 79 87 80  Resp: 19 20 18 18   Temp: 99 F (37.2 C) 98.3 F (36.8 C) 99.5 F (37.5 C) 98.1 F (36.7 C)  TempSrc: Oral Oral Oral Oral  SpO2: 99% 96% 100% 100%  Weight: 109.7 kg (241 lb 13.5 oz)   105.3 kg (232 lb 2.3 oz)  Height:        Inpatient medications: . allopurinol  100 mg Oral Q breakfast  . brimonidine  1 drop Left Eye QPC breakfast  . Chlorhexidine Gluconate Cloth  6 each Topical Q0600  . furosemide  80 mg Intravenous Q8H  . heparin  5,000 Units Subcutaneous Q12H  . insulin aspart  0-9 Units Subcutaneous TID WC  . metroNIDAZOLE  500 mg Oral Q8H  . mupirocin ointment  1 application Nasal BID  . potassium chloride  30 mEq Oral Once  . QUEtiapine  25 mg Oral BID  . sodium chloride flush  3 mL Intravenous Q12H     acetaminophen **OR** acetaminophen, ondansetron **OR** ondansetron (ZOFRAN) IV, senna-docusate, technetium TC 20M diethylenetriame-pentaacetic acid  Exam: Gen somnolent, arouses easily, no distress, obese AAF Sclera anicteric, throat clear  No jvd or bruits Chest clear bilat, dec'd at bases RRR no MRG Abd soft ntnd no mass or ascites +bs, +abd wall edema GU foley in place MS no joint effusions or deformity Ext 1-2+ diffuse bilat LE edema / no wounds or ulcers Neuro is alert, Ox 3 , nf, mild asterixis  CXR 9/29 was clear   Renal US was unremarkable UA showed 6-30 wbc and epi's, 0-5 rbc, neg protein.           Na 140  K 3.8  BUN 72  Cr 3.93   Ca 8.1  WBC 11  Hb 9.6  Plt 285  Alb 2.4   LFT"s ok  Tprot 5.8  Home meds > Lamictal, flagyl, MVI, probiotic, vit C, Allopurinol, Norvasc, Lipitor, Coreg, colace, Lasix 20 bid, Hydralazine 50 tid, Lantus 12 u SQ qhs, Seroquel bid, senokot  -S  Assessment: 1. Acute on CKD 3 - baseline creat 1.7, peak creat 5, continue to improve down to 3.5 today. Attempting to diurese some w IV lasix. Have placed foley. Cont 80 IV tid for now. 2. Cdif assoc diarrhea - sp recent abx, on po flagyl 3. Vol - edematous as above 4. NICM EF 30-35% 5. Morbid obesity 6. DM 2 w complications 7. Blind R eye 8. HTN - BP meds on hold, BP's good 9. Debility / SNF dependent x 4 yrs   Plan - cont lasix, creat improving   Vinson Moselle MD [facility] pager [phone]   09/22/2016, 2:07 PM    Recent Labs Lab 09/18/16 1309  09/20/16 0950 09/21/16 0546 09/22/16 1001  NA 137  < > 139 140 140  K 3.4*  < > 3.3* 3.8 4.6  CL 106  < > 109 110 112*  CO2 23  < > 20* 20* 21*  GLUCOSE 111*  < > 133* 111* 115*  BUN 71*  < > 71* 72* 65*  CREATININE 5.98*  < > 4.64* 3.93* 3.46*  CALCIUM 8.1*  < > 8.3* 8.1* 8.3*  PHOS 4.9*  --   --   --   --   < > =  values in this interval not displayed.  Recent Labs Lab 09/18/16 1309  AST 60*  ALT 40  ALKPHOS 106  BILITOT 0.6  PROT 5.8*  ALBUMIN 2.4*    Recent Labs Lab 09/18/16 1309  09/20/16 0950 09/21/16 0546 09/22/16 0608  WBC 13.1*  < > 10.6* 11.4* 11.0*  NEUTROABS 9.9*  --   --   --   --   HGB 9.7*  < > 9.5* 9.6* 9.5*  HCT 28.2*  < > 27.9* 28.1* 27.6*  MCV 92.2  < > 92.1 93.7 90.8  PLT 274  < > 277 285 277  < > = values in this interval not displayed. Iron/TIBC/Ferritin/ %Sat    Component Value Date/Time   IRON 31 (L) 02/27/2012 0013   TIBC 213 (L) 02/27/2012 0013   FERRITIN 107 02/27/2012 0013   IRONPCTSAT 15 (L) 02/27/2012 0013

## 2016-09-23 ENCOUNTER — Inpatient Hospital Stay (HOSPITAL_COMMUNITY): Payer: Medicare Other

## 2016-09-23 DIAGNOSIS — D72829 Elevated white blood cell count, unspecified: Secondary | ICD-10-CM

## 2016-09-23 DIAGNOSIS — E876 Hypokalemia: Secondary | ICD-10-CM

## 2016-09-23 DIAGNOSIS — I10 Essential (primary) hypertension: Secondary | ICD-10-CM

## 2016-09-23 DIAGNOSIS — R627 Adult failure to thrive: Secondary | ICD-10-CM

## 2016-09-23 DIAGNOSIS — H547 Unspecified visual loss: Secondary | ICD-10-CM

## 2016-09-23 LAB — GLUCOSE, CAPILLARY
Glucose-Capillary: 101 mg/dL — ABNORMAL HIGH (ref 65–99)
Glucose-Capillary: 123 mg/dL — ABNORMAL HIGH (ref 65–99)
Glucose-Capillary: 113 mg/dL — ABNORMAL HIGH (ref 65–99)
Glucose-Capillary: 132 mg/dL — ABNORMAL HIGH (ref 65–99)

## 2016-09-23 LAB — CBC
HCT: 29.4 % — ABNORMAL LOW (ref 36.0–46.0)
HEMOGLOBIN: 9.9 g/dL — AB (ref 12.0–15.0)
MCH: 31.5 pg (ref 26.0–34.0)
MCHC: 33.7 g/dL (ref 30.0–36.0)
MCV: 93.6 fL (ref 78.0–100.0)
Platelets: 305 10*3/uL (ref 150–400)
RBC: 3.14 MIL/uL — ABNORMAL LOW (ref 3.87–5.11)
RDW: 15.1 % (ref 11.5–15.5)
WBC: 10.7 10*3/uL — ABNORMAL HIGH (ref 4.0–10.5)

## 2016-09-23 LAB — BASIC METABOLIC PANEL
ANION GAP: 9 (ref 5–15)
BUN: 61 mg/dL — ABNORMAL HIGH (ref 6–20)
CALCIUM: 8.5 mg/dL — AB (ref 8.9–10.3)
CO2: 24 mmol/L (ref 22–32)
Chloride: 106 mmol/L (ref 101–111)
Creatinine, Ser: 3.01 mg/dL — ABNORMAL HIGH (ref 0.44–1.00)
GFR calc Af Amer: 16 mL/min — ABNORMAL LOW (ref >60)
GFR, EST NON AFRICAN AMERICAN: 14 mL/min — AB (ref >60)
GLUCOSE: 105 mg/dL — AB (ref 65–99)
Potassium: 3.6 mmol/L (ref 3.5–5.1)
SODIUM: 139 mmol/L (ref 135–145)

## 2016-09-23 MED ORDER — IOPAMIDOL (ISOVUE-300) INJECTION 61%
30.0000 mL | Freq: Once | INTRAVENOUS | Status: AC | PRN
  Administered 2016-09-23: 30 mL via ORAL

## 2016-09-23 MED ORDER — FLUCONAZOLE 200 MG PO TABS
200.0000 mg | ORAL_TABLET | Freq: Once | ORAL | Status: AC
  Administered 2016-09-23: 200 mg via ORAL
  Filled 2016-09-23 (×2): qty 1

## 2016-09-23 NOTE — Progress Notes (Signed)
[facility] Progress Note   Subjective: 2.5 L UOP yest , wt's down , no new c/o, Cr down 3.0  Vitals:   09/22/16 1425 09/22/16 2229 09/23/16 0536 09/23/16 1418  BP: (!) 135/52 (!) 118/95 (!) 136/48 (!) 129/48  Pulse: 88     Resp: 18  17 17   Temp: 98.4 F (36.9 C) 98.2 F (36.8 C) 98.5 F (36.9 C) 99.7 F (37.6 C)  TempSrc: Oral Oral Oral Oral  SpO2: 100% 98% 98% 100%  Weight:      Height:        Inpatient medications: . allopurinol  100 mg Oral Q breakfast  . brimonidine  1 drop Left Eye QPC breakfast  . Chlorhexidine Gluconate Cloth  6 each Topical Q0600  . furosemide  80 mg Intravenous Q8H  . heparin  5,000 Units Subcutaneous Q12H  . insulin aspart  0-9 Units Subcutaneous TID WC  . metroNIDAZOLE  500 mg Oral Q8H  . mupirocin ointment  1 application Nasal BID  . potassium chloride  30 mEq Oral Once  . QUEtiapine  25 mg Oral BID  . sodium chloride flush  3 mL Intravenous Q12H     acetaminophen **OR** acetaminophen, ondansetron **OR** ondansetron (ZOFRAN) IV, senna-docusate, technetium TC 68M diethylenetriame-pentaacetic acid  Exam: Gen somnolent, arouses easily, no distress, obese AAF Sclera anicteric, throat clear  No jvd or bruits Chest clear bilat, dec'd at bases RRR no MRG Abd soft ntnd no mass or ascites +bs, +abd wall edema GU foley in place MS no joint effusions or deformity Ext 1-2+  bilat UE/ LE edema / no wounds or ulcers Neuro is alert, Ox 3 , nf  CXR 9/29 was clear   Renal US was unremarkable UA showed 6-30 wbc and epi's, 0-5 rbc, neg protein.           Na 140  K 3.8  BUN 72  Cr 3.93   Ca 8.1  WBC 11  Hb 9.6  Plt 285  Alb 2.4   LFT"s ok  Tprot 5.8  Home meds > Lamictal, flagyl, MVI, probiotic, vit C, Allopurinol, Norvasc, Lipitor, Coreg, colace, Lasix 20 bid, Hydralazine 50 tid, Lantus 12 u SQ qhs, Seroquel bid, senokot -S  Assessment: 1. Acute on CKD 3 - baseline 1.7, peak creat 5.0, improving w creat 3.0  today 2. Diarrhea - not sure if Cdif or not, primary investigating 3. Vol - improving w IV lasix 4. NICM EF 30-35% 5. Morbid obesity 6. DM 2 w complications 7. Blind R eye 8. HTN - BP meds on hold, BP's good 9. Debility / SNF dependent x 4 yrs   Plan - would continue IV lasix another 24 hours then change to po lasix (40-80 bid) for 2-3 days then can prob dc. Will sign off.   Vinson Moselle MD [facility] pager [phone]   09/23/2016, 2:43 PM    Recent Labs Lab 09/18/16 1309  09/21/16 0546 09/22/16 1001 09/23/16 0620  NA 137  < > 140 140 139  K 3.4*  < > 3.8 4.6 3.6  CL 106  < > 110 112* 106  CO2 23  < > 20* 21* 24  GLUCOSE 111*  < > 111* 115* 105*  BUN 71*  < > 72* 65* 61*  CREATININE 5.98*  < > 3.93* 3.46* 3.01*  CALCIUM 8.1*  < > 8.1* 8.3* 8.5*  PHOS 4.9*  --   --   --   --   < > =  values in this interval not displayed.  Recent Labs Lab 09/18/16 1309  AST 60*  ALT 40  ALKPHOS 106  BILITOT 0.6  PROT 5.8*  ALBUMIN 2.4*    Recent Labs Lab 09/18/16 1309  09/21/16 0546 09/22/16 0608 09/23/16 0620  WBC 13.1*  < > 11.4* 11.0* 10.7*  NEUTROABS 9.9*  --   --   --   --   HGB 9.7*  < > 9.6* 9.5* 9.9*  HCT 28.2*  < > 28.1* 27.6* 29.4*  MCV 92.2  < > 93.7 90.8 93.6  PLT 274  < > 285 277 305  < > = values in this interval not displayed. Iron/TIBC/Ferritin/ %Sat    Component Value Date/Time   IRON 31 (L) 02/27/2012 0013   TIBC 213 (L) 02/27/2012 0013   FERRITIN 107 02/27/2012 0013   IRONPCTSAT 15 (L) 02/27/2012 0013

## 2016-09-23 NOTE — Care Management Important Message (Signed)
Important Message  Patient Details IM Letter given to Suzanne/Case Manager to present to Patient Name: [name] MRN: [id number] Date of Birth: February 19, 1936   Medicare Important Message Given:  Yes    Haskell Flirt 09/23/2016, 2:11 PMImportant Message  Patient Details  Name: [name] MRN: [id number] Date of Birth: 11/12/1936   Medicare Important Message Given:  Yes    Haskell Flirt 09/23/2016, 2:10 PM

## 2016-09-23 NOTE — Progress Notes (Signed)
Patient ID: [name], female   DOB: February 22, 1936, 80 y.o.   MRN: [id number]  PROGRESS NOTE    [name] Clicheearlie H Morel  WUJ:[id number] DOB: February 22, 1936 DOA: 09/18/2016  PCP: Georgann HousekeeperHUSAIN,KARRAR, MD   Brief Narrative:   80 y.o. female with past medical history significant for CAD, diabetes, hypertension, systolic congestive heart failure, CKD stage III, blindness [facility] creatinine. On 9/18 ptcreatine was 1.7 and on this admission 5.98. She was also found to have leukocytosis of 16.3. Patient has recently been treated for a UTI and pneumonia in the past month. She was started on Rocephin, Levaquin, clindamycin, Macrobid. She finished her Levaquin and clindamycin antibiotics on 09/13/2016. She started to have diarrhea and subsequently was found to have stool positive for C.diff.   Assessment & Plan:   Principal Problem:   Acute renal failure superimposed on CKD stage 3 - Baseline Cr 1.7 and on this admission 5.98 - Likely due to prerenal causes, dehydration, diarrhea, lasix. Lasix initially on hold  - Cr continues to improve with IV fluids but she is now edematous - Appreciate nephrology input, recommendation is to start patient on Lasix and stop IV fluids. Lasix started 09/21/2016 80 mg IV Q 8 hours  - No acute findings on renal US, ua + yeast   Active Problems:   Controlled type 2 diabetes mellitus with blindness and proliferative retinopathy and diabetic nephropathy with long term insulin use - Continue SSI - A1c on this admission 5.5, indicating good glycemic control - CBG's in past 24 hours:141, 107, 142    Essential hypertension -  home bp meds held since admission,  -BP stable, will likely need to discontinue some of her home bp meds at discharge,    Chronic diastolic heart failure (HCC) - now lower extremity edema, but does has upper extremity edema - Resume lasix per renal     Anemia in chronic kidney disease - Hemoglobin  stable at 9.5    Leukocytosis - Likely reactive  - wbc improving    Hypokalemia - Due to lasix and diarrhea - Supplemented and WNL    C. difficile diarrhea / Leukocytosis -, gi pcr panel here is negative ( gi pcr panel does not include  c diff test though) per admission, [facility] will [facility] for c diff test result - she was started on  Flagyl from 9/29 -no bm documented since being admitted, c/o left sided  ab pain on 10/4, will get CT ab /pel without contrast ( cr elevated)  Body mass index is 39.85 kg/m.  FTT;  per physical therapy" Pt unable to stand with +2 assist, will need mechanical lift for OOB. "will need SNF.  DVT prophylaxis while in the hospital: Heparin subQ Code Status: DNR/DNI  Family Communication: no family at the bedside this am Disposition Plan: SNF in 1-2 days    Consultants:   Nephrology, Dr. Arlean HoppingSchertz    Procedures:   None   Antimicrobials:   Flagyl 09/18/2016 -->    Subjective: Patient is sleepy , does not open her eyes for conversation, but oriented x3, report no bm for the last several days, C/o left sided abdominal pain, poor appetite, no fever,  Foley catheter in place with mild cloudy urine  Objective: Vitals:   09/22/16 1425 09/22/16 2229 09/23/16 0536 09/23/16 1418  BP: (!) 135/52 (!) 118/95 (!) 136/48 (!) 129/48  Pulse: 88     Resp:  18  17 17   Temp: 98.4 F (36.9 C) 98.2 F (36.8 C) 98.5 F (36.9 C) 99.7 F (37.6 C)  TempSrc: Oral Oral Oral Oral  SpO2: 100% 98% 98% 100%  Weight:      Height:        Intake/Output Summary (Last 24 hours) at 09/23/16 1431 Last data filed at 09/23/16 0900  Gross per 24 hour  Intake              343 ml  Output             3075 ml  Net            -2732 ml   Filed Weights   09/20/16 1559 09/21/16 0541 09/22/16 0602  Weight: 106 kg (233 lb 11 oz) 109.7 kg (241 lb 13.5 oz) 105.3 kg (232 lb 2.3 oz)    Examination:  General exam: Patient is sleepy , does not open  her eyes for conversation, but oriented x3, no acute distress,   Respiratory system: decreased breath sounds at basis, no wheezing , no rales, she does not take deep breath Cardiovascular system: S1 & S2 heard, RRR Gastrointestinal system: (+) BS, mild left lower quadrant tenderness, but soft, no guarding Central nervous system: No focal deficits  Extremities: UE swelling noted, palpable pulses  Skin: no lesions or ulcers  Psychiatry: Normal mood and behavior    Data Reviewed: I have personally reviewed following labs and imaging studies  CBC:  Recent Labs Lab 09/18/16 1309 09/19/16 0553 09/20/16 0950 09/21/16 0546 09/22/16 0608 09/23/16 0620  WBC 13.1* 10.5 10.6* 11.4* 11.0* 10.7*  NEUTROABS 9.9*  --   --   --   --   --   HGB 9.7* 9.1* 9.5* 9.6* 9.5* 9.9*  HCT 28.2* 26.9* 27.9* 28.1* 27.6* 29.4*  MCV 92.2 91.5 92.1 93.7 90.8 93.6  PLT 274 278 277 285 277 305   Basic Metabolic Panel:  Recent Labs Lab 09/18/16 1309 09/19/16 0553 09/20/16 0950 09/21/16 0546 09/22/16 1001 09/23/16 0620  NA 137 138 139 140 140 139  K 3.4* 3.2* 3.3* 3.8 4.6 3.6  CL 106 108 109 110 112* 106  CO2 23 21* 20* 20* 21* 24  GLUCOSE 111* 112* 133* 111* 115* 105*  BUN 71* 70* 71* 72* 65* 61*  CREATININE 5.98* 5.29* 4.64* 3.93* 3.46* 3.01*  CALCIUM 8.1* 8.0* 8.3* 8.1* 8.3* 8.5*  MG 2.3  --   --   --   --   --   PHOS 4.9*  --   --   --   --   --    GFR: Estimated Creatinine Clearance: 18.5 mL/min (by C-G formula based on SCr of 3.01 mg/dL (H)). Liver Function Tests:  Recent Labs Lab 09/18/16 1309  AST 60*  ALT 40  ALKPHOS 106  BILITOT 0.6  PROT 5.8*  ALBUMIN 2.4*   No results for input(s): LIPASE, AMYLASE in the last 168 hours. No results for input(s): AMMONIA in the last 168 hours. Coagulation Profile: No results for input(s): INR, PROTIME in the last 168 hours. Cardiac Enzymes: No results for input(s): CKTOTAL, CKMB, CKMBINDEX, TROPONINI in the last 168 hours. BNP (last 3  results) No results for input(s): PROBNP in the last 8760 hours. HbA1C: No results for input(s): HGBA1C in the last 72 hours. CBG:  Recent Labs Lab 09/22/16 1138 09/22/16 1716 09/22/16 2238 09/23/16 0749 09/23/16 1149  GLUCAP 142* 137* 145* 101* 123*   Lipid Profile: No results for  input(s): CHOL, HDL, LDLCALC, TRIG, CHOLHDL, LDLDIRECT in the last 72 hours. Thyroid Function Tests: No results for input(s): TSH, T4TOTAL, FREET4, T3FREE, THYROIDAB in the last 72 hours. Anemia Panel: No results for input(s): VITAMINB12, FOLATE, FERRITIN, TIBC, IRON, RETICCTPCT in the last 72 hours. Urine analysis:    Component Value Date/Time   COLORURINE AMBER (A) 09/18/2016 1348   APPEARANCEUR CLOUDY (A) 09/18/2016 1348   LABSPEC 1.024 09/18/2016 1348   PHURINE 5.0 09/18/2016 1348   GLUCOSEU NEGATIVE 09/18/2016 1348   GLUCOSEU NEG mg/dL 96/03/5408 8119   HGBUR NEGATIVE 09/18/2016 1348   HGBUR negative 07/02/2009 1003   BILIRUBINUR SMALL (A) 09/18/2016 1348   KETONESUR NEGATIVE 09/18/2016 1348   PROTEINUR NEGATIVE 09/18/2016 1348   UROBILINOGEN 0.2 03/05/2015 1854   NITRITE NEGATIVE 09/18/2016 1348   LEUKOCYTESUR TRACE (A) 09/18/2016 1348   Sepsis Labs: @LABRCNTIP (procalcitonin:4,lacticidven:4)   Recent Results (from the past 240 hour(s))  MRSA PCR Screening     Status: Abnormal   Collection Time: 09/19/16  9:51 AM  Result Value Ref Range Status   MRSA by PCR POSITIVE (A) NEGATIVE Final      Radiology Studies: Dg Chest 2 View Result Date: 09/18/2016 No active cardiopulmonary disease. No evidence of pneumonia. Stable cardiomegaly.  US Renal Result Date: 09/18/2016 Exam is somewhat limited due to body habitus. No definite renal abnormality is noted.   Nm Pulmonary Perf And Vent Result Date: 09/18/2016 No evidence of pulmonary embolism.     Scheduled Meds: . allopurinol  100 mg Oral Q breakfast  . brimonidine  1 drop Left Eye QPC breakfast  . Chlorhexidine Gluconate Cloth   6 each Topical Q0600  . furosemide  80 mg Intravenous Q8H  . heparin  5,000 Units Subcutaneous Q12H  . insulin aspart  0-9 Units Subcutaneous TID WC  . metroNIDAZOLE  500 mg Oral Q8H  . mupirocin ointment  1 application Nasal BID  . potassium chloride  30 mEq Oral Once  . QUEtiapine  25 mg Oral BID  . sodium chloride flush  3 mL Intravenous Q12H   Continuous Infusions:     LOS: 5 days    Time spent: 35 minutes  Greater than 50% of the time spent on counseling and coordinating the care.   Albertine Grates, MD PhD [facility] Pager [phone]  If 7PM-7AM, please contact night-coverage [email or web address] Password [email or web address] 09/23/2016, 2:31 PM

## 2016-09-23 NOTE — Progress Notes (Signed)
Physical Therapy Treatment Patient Details Name: [name] MRN: [id number] DOB: October 05, 1936 Today's Date: 09/23/2016    History of Present Illness 80 y.o. female admitted with c diff, ARF. Pt is from SNF. PMH of DM, blindness, CHF, CKD, recent UTI and PNA.     PT Comments    Pt very groggy when entering room. Required a lot of verbal and tactile to stimulate patient and then had to begin moving extremities to get patient to become alert enough to help assist with movement. PT still pretty groggy at end of session, however did participate in bed mobility, rolling, supine UE and LE exercises and repositioning to relieve bottom soreness.   Follow Up Recommendations  SNF;Supervision/Assistance - 24 hour     Equipment Recommendations  None recommended by PT    Recommendations for Other Services       Precautions / Restrictions      Mobility  Bed Mobility Overal bed mobility: Needs Assistance;+2 for physical assistance Bed Mobility: Rolling Rolling: Mod assist;+2 for physical assistance (additional assistance to complete full roll to side to help with repositioning )         General bed mobility comments: worked with bed exercises BUE and BLE and rolling 2x each side for skin integrity testing and repositioned , will attempt EOB sitting exercises next visit with assistance   Transfers                    Ambulation/Gait                 Stairs            Wheelchair Mobility    Modified Rankin (Stroke Patients Only)       Balance                                    Cognition Arousal/Alertness: Lethargic (very lethargic and difficult to awaken. took about 5 minutes of UE and LE movement and rolling to help keep patient alert to have some conversation.) Behavior During Therapy: WFL for tasks assessed/performed (just very groggy ) Overall Cognitive Status: Within Functional Limits for tasks assessed                       Exercises Other Exercises Other Exercises: supine UE arm reaches up to ceiling Assisted 10xs, B LE hip and knee flexion assisted (limited knee flexion range), and then BLE 10xs hip AB/AD exercises assisted.     General Comments        Pertinent Vitals/Pain Pain Assessment: 0-10 Pain Score: 2  Pain Location: "my bottom hurts" Pain Descriptors / Indicators: Burning;Aching Pain Intervention(s): Other (comment);Repositioned (assessed bottom area with rollign with nurse present and nurse applied allyvn to area and then rolled to reposition pateint facing right side off bottom /sore area. Pt stated it felt better after this. )    Home Living                      Prior Function            PT Goals (current goals can now be found in the care plan section) Acute Rehab PT Goals Patient Stated Goal: go to church and Bible study at SNF PT Goal Formulation: With patient Time For Goal Achievement: 10/05/16 Potential to Achieve Goals: Fair Progress towards PT goals: Progressing toward goals  Frequency    Min 2X/week      PT Plan Current plan remains appropriate    Co-evaluation             End of Session   Activity Tolerance: Patient limited by fatigue (and limited due to needing more assistance to get pateint EOB ) Patient left: in bed;with call bell/phone within reach;with nursing/sitter in room     Time: 6384-5364 PT Time Calculation (min) (ACUTE ONLY): 22 min  Charges:  $Therapeutic Exercise: 8-22 mins                    G CodesMarella Bile 15-Oct-2016, 5:03 PM  Marella Bile, PT Pager: [phone] 10-15-2016

## 2016-09-24 DIAGNOSIS — K853 Drug induced acute pancreatitis without necrosis or infection: Secondary | ICD-10-CM

## 2016-09-24 DIAGNOSIS — A0472 Enterocolitis due to Clostridium difficile, not specified as recurrent: Secondary | ICD-10-CM

## 2016-09-24 DIAGNOSIS — M6281 Muscle weakness (generalized): Secondary | ICD-10-CM

## 2016-09-24 LAB — GLUCOSE, CAPILLARY
GLUCOSE-CAPILLARY: 107 mg/dL — AB (ref 65–99)
GLUCOSE-CAPILLARY: 102 mg/dL — AB (ref 65–99)
GLUCOSE-CAPILLARY: 141 mg/dL — AB (ref 65–99)
Glucose-Capillary: 154 mg/dL — ABNORMAL HIGH (ref 65–99)
Glucose-Capillary: 138 mg/dL — ABNORMAL HIGH (ref 65–99)

## 2016-09-24 LAB — BASIC METABOLIC PANEL
ANION GAP: 8 (ref 5–15)
BUN: 57 mg/dL — ABNORMAL HIGH (ref 6–20)
CALCIUM: 8.5 mg/dL — AB (ref 8.9–10.3)
CHLORIDE: 106 mmol/L (ref 101–111)
CO2: 25 mmol/L (ref 22–32)
CREATININE: 2.67 mg/dL — AB (ref 0.44–1.00)
GFR calc non Af Amer: 16 mL/min — ABNORMAL LOW (ref >60)
GFR, EST AFRICAN AMERICAN: 19 mL/min — AB (ref >60)
Glucose, Bld: 111 mg/dL — ABNORMAL HIGH (ref 65–99)
Potassium: 3.8 mmol/L (ref 3.5–5.1)
SODIUM: 139 mmol/L (ref 135–145)

## 2016-09-24 LAB — CBC
HCT: 29.1 % — ABNORMAL LOW (ref 36.0–46.0)
HEMOGLOBIN: 9.7 g/dL — AB (ref 12.0–15.0)
MCH: 31.5 pg (ref 26.0–34.0)
MCHC: 33.3 g/dL (ref 30.0–36.0)
MCV: 94.5 fL (ref 78.0–100.0)
PLATELETS: 299 10*3/uL (ref 150–400)
RBC: 3.08 MIL/uL — AB (ref 3.87–5.11)
RDW: 15.5 % (ref 11.5–15.5)
WBC: 11.7 10*3/uL — ABNORMAL HIGH (ref 4.0–10.5)

## 2016-09-24 LAB — HEPATIC FUNCTION PANEL
ALBUMIN: 2.5 g/dL — AB (ref 3.5–5.0)
ALK PHOS: 65 U/L (ref 38–126)
ALT: 14 U/L (ref 14–54)
AST: 19 U/L (ref 15–41)
Bilirubin, Direct: <0.1 mg/dL — ABNORMAL LOW (ref 0.1–0.5)
TOTAL PROTEIN: 5.7 g/dL — AB (ref 6.5–8.1)
Total Bilirubin: 0.6 mg/dL (ref 0.3–1.2)

## 2016-09-24 LAB — LIPASE, BLOOD: Lipase: 350 U/L — ABNORMAL HIGH (ref 11–51)

## 2016-09-24 MED ORDER — FUROSEMIDE 40 MG PO TABS
80.0000 mg | ORAL_TABLET | Freq: Two times a day (BID) | ORAL | Status: DC
  Administered 2016-09-24 – 2016-09-25 (×4): 80 mg via ORAL
  Filled 2016-09-24 (×4): qty 2

## 2016-09-24 MED ORDER — SENNOSIDES-DOCUSATE SODIUM 8.6-50 MG PO TABS
1.0000 | ORAL_TABLET | Freq: Every day | ORAL | Status: DC
  Administered 2016-09-24 – 2016-09-30 (×7): 1 via ORAL
  Filled 2016-09-24 (×8): qty 1

## 2016-09-24 MED ORDER — MORPHINE SULFATE (PF) 2 MG/ML IV SOLN: 2.0000 mg | INTRAVENOUS | Status: DC | PRN

## 2016-09-24 MED ORDER — VANCOMYCIN 50 MG/ML ORAL SOLUTION
125.0000 mg | Freq: Four times a day (QID) | ORAL | Status: DC
  Administered 2016-09-24 – 2016-10-01 (×24): 125 mg via ORAL
  Filled 2016-09-24 (×28): qty 2.5

## 2016-09-24 NOTE — Progress Notes (Signed)
Physical Therapy Treatment Patient Details Name: [name] MRN: [id number] DOB: 09/27/1936 Today's Date: 09/27/2016    History of Present Illness 80 y.o. female admitted with c diff, ARF. Pt is from SNF. PMH of DM, blindness, CHF, CKD, recent UTI and PNA.     PT Comments    Very limited session today. Worked with rolling and bed mobility, however pt declined further exercises or edge of bed today due to her stomach hurting.   Follow Up Recommendations  SNF;Supervision/Assistance - 24 hour     Equipment Recommendations  None recommended by PT    Recommendations for Other Services       Precautions / Restrictions Precautions Precautions: Fall Precaution Comments: non-ambulatory at baseline Restrictions Weight Bearing Restrictions: No    Mobility  Bed Mobility Overal bed mobility: Needs Assistance;+2 for physical assistance Bed Mobility: Rolling Rolling: Mod assist         General bed mobility comments: worked on rolling 1x and then pt refused to sit edge of bed or do any more today. Limited session. Pt stated her stomach hurt, however I encouraged her to sit EOB with nursing or have them use hoyer lift to get her to the recliner tomorrow.   Transfers                    Ambulation/Gait                 Stairs            Wheelchair Mobility    Modified Rankin (Stroke Patients Only)       Balance                                    Cognition Arousal/Alertness: Awake/alert (more alert today ) Behavior During Therapy: WFL for tasks assessed/performed Overall Cognitive Status: Within Functional Limits for tasks assessed                      Exercises      General Comments        Pertinent Vitals/Pain Pain Assessment: No/denies pain    Home Living                      Prior Function            PT Goals (current goals can now be found in the care plan section) Acute Rehab PT  Goals Patient Stated Goal: go to church and Bible study at SNF PT Goal Formulation: With patient Time For Goal Achievement: 10/05/16 Potential to Achieve Goals: Fair Progress towards PT goals: Progressing toward goals    Frequency    Min 2X/week      PT Plan Current plan remains appropriate    Co-evaluation             End of Session   Activity Tolerance: Patient limited by fatigue;Treatment limited secondary to medical complications (Comment) Patient left: in bed (rolled to side for pressure relief)     Time: 1432-1450 PT Time Calculation (min) (ACUTE ONLY): 18 min  Charges:  $Therapeutic Activity: 8-22 mins                    G CodesMarella Bile 09/27/2016, 6:27 PM  Marella Bile, PT Pager: [phone] 09-27-2016

## 2016-09-24 NOTE — Progress Notes (Signed)
Patient ID: [name], female   DOB: [birth year], 80 y.o.   MRN: [id number]  PROGRESS NOTE  [name]  WUJ:[id number] DOB: [birth year] DOA: 09/18/2016  PCP: Georgann HousekeeperHUSAIN,KARRAR, MD  Brief Narrative:  [name] is a 80 y.o. female with past medical history significant for CAD, diabetes, hypertension, systolic congestive heart failure, CKD stage III, blindness who presented from [facility] living for elevated creatinine. Creatinine was 5/98 on admission, from baseline of 1.7. She had experienced significant diarrhea in the setting of recent abx (levaquin and clindamycin) for PNA and UTI. C. diff on stool sample PTA was positive for C diff and flagyl had been started. She was admitted for   Assessment & Plan: Acute renal failure superimposed on CKD stage 3: Creatinine peak 5.98, baseline 1.7. Cause presumed to be prerenal initially due to dehydration from diarrhea causing oliguric ARF. IV fluids produced edema, so lasix (a home med) was started with significant diuresis and improving renal clearance. - Transition lasix to po (80mg  IV TID -> 80mg  po BID) and eventually to home dose of 20mg  po.  - Nephrology has signed off.  fluids but she is now edematous - Appreciate nephrology input, recommendation is to start patient on Lasix and stop IV fluids. Lasix started 09/21/2016 80 mg IV Q 8 hours  - No acute findings on renal US, ua + yeast  C. difficile colitis: Acute, associated with abx for PNA and UTI. Causing leukocytosis. GI pathogen panel neg here, but CDiff PCR report in chart from SNF is positive. Diarrhea has been resolved since admission. - Replace flagyl with po vancomycin due to concern for pancreatitis - Contact precautions  Constipation: Now experiencing constipation and CT abdomen with evidence of stercoral colitis. - Soap suds enema today, senna-ducosate daily  Acute pancreatitis: Mild. Lipase 350, epigastric tenderness without rebound/distention. Peripancreatic  fatty edema noted on CT abd/pelvis. No pancreatic mass/ductal dilation or GB wall thickening despite cholelithiasis. Could be due to flagyl or increasing lasix acutely, though drug-induced pancreatitis is the cause of ~1% of acute pancreatitis. Triglycerides 155 a couple weeks ago. Calcium mildly increased, doubt this has caused pancreatic inflammation. No EtOH history. - D/C flagyl, continue lasix taper as above - Hold IVF, but will monitor closely for 3rd spacing - Pt is not eating - Morphine IV prn  Chronic HFrEF due to NICM: EF 30-35% - Volume status improving with lasix, tapering as above  Controlled type 2 diabetes mellitus with blindness and proliferative retinopathy and diabetic nephropathy with long term insulin use: A1c on this admission 5.5, indicating good glycemic control - Continue SSI  Essential hypertension: Chronic. Controlled without home meds. - BP stable, will likely need to discontinue some of her home bp meds at discharge  Anemia in chronic kidney disease - Hemoglobin stable at 9.5  Hypokalemia - Due to lasix and diarrhea - Supplemented and WNL  FTT;  per physical therapy "Pt unable to stand with +2 assist, will need mechanical lift for OOB." - Continues to require SNF placement.  DVT prophylaxis while in the hospital: Heparin subQ Code Status: DNR/DNI  Family Communication: no family at the bedside this am Disposition Plan: SNF pending clinical improvement  Consultants:   Nephrology, Dr. Arlean HoppingSchertz    Procedures:   None   Antimicrobials:   Flagyl 09/18/2016 >> 09/24/2016  PO Vancomycin 10/6 >> 10/12 to complete 14 day tx.  Subjective: Patient reports gnawing moderate abdominal pain in the "middle" of her abdomen increased with food. No BM  overnight, poor appetite.   Objective: Vitals:   09/23/16 1418 09/23/16 2105 09/24/16 0546 09/24/16 1241  BP: (!) 129/48 (!) 118/44 (!) 127/51   Pulse:  83 85   Resp: 17 16 16    Temp: 99.7 F (37.6 C) 98.4 F  (36.9 C) 98.8 F (37.1 C)   TempSrc: Oral Oral Oral   SpO2: 100% 99% 96%   Weight:    101.9 kg (224 lb 10.4 oz)  Height:        Intake/Output Summary (Last 24 hours) at 09/24/16 1514 Last data filed at 09/24/16 0955  Gross per 24 hour  Intake              240 ml  Output              200 ml  Net               40 ml   Filed Weights   09/21/16 0541 09/22/16 0602 09/24/16 1241  Weight: 109.7 kg (241 lb 13.5 oz) 105.3 kg (232 lb 2.3 oz) 101.9 kg (224 lb 10.4 oz)    Examination:  General exam: Elderly female resting quietly on her back in no distress. Does not open eyes for conversation but responds appropriately, follows commands and fully oriented. Respiratory system: decreased respiratory effort with globally diminished breath sounds. No crackles or wheezes. Cardiovascular system: S1 & S2 heard, RRR. No JVD Gastrointestinal system: (+) BS, soft, obese, mild epigastric tenderness to deep palpation without rebound or guarding.  GU: Foley in place. Central nervous system: No focal deficits  Extremities: 1+ bilateral UE swelling noted, palpable pulses  Skin: no lesions or ulcers noted Psychiatry: Normal mood and behavior    Data Reviewed: I have personally reviewed following labs and imaging studies  CBC:  Recent Labs Lab 09/18/16 1309  09/20/16 0950 09/21/16 0546 09/22/16 0608 09/23/16 0620 09/24/16 0541  WBC 13.1*  < > 10.6* 11.4* 11.0* 10.7* 11.7*  NEUTROABS 9.9*  --   --   --   --   --   --   HGB 9.7*  < > 9.5* 9.6* 9.5* 9.9* 9.7*  HCT 28.2*  < > 27.9* 28.1* 27.6* 29.4* 29.1*  MCV 92.2  < > 92.1 93.7 90.8 93.6 94.5  PLT 274  < > 277 285 277 305 299  < > = values in this interval not displayed. Basic Metabolic Panel:  Recent Labs Lab 09/18/16 1309  09/20/16 0950 09/21/16 0546 09/22/16 1001 09/23/16 0620 09/24/16 0541  NA 137  < > 139 140 140 139 139  K 3.4*  < > 3.3* 3.8 4.6 3.6 3.8  CL 106  < > 109 110 112* 106 106  CO2 23  < > 20* 20* 21* 24 25  GLUCOSE  111*  < > 133* 111* 115* 105* 111*  BUN 71*  < > 71* 72* 65* 61* 57*  CREATININE 5.98*  < > 4.64* 3.93* 3.46* 3.01* 2.67*  CALCIUM 8.1*  < > 8.3* 8.1* 8.3* 8.5* 8.5*  MG 2.3  --   --   --   --   --   --   PHOS 4.9*  --   --   --   --   --   --   < > = values in this interval not displayed. GFR: Estimated Creatinine Clearance: 20.5 mL/min (by C-G formula based on SCr of 2.67 mg/dL (H)). Liver Function Tests:  Recent Labs Lab 09/18/16 1309  AST 60*  ALT 40  ALKPHOS 106  BILITOT 0.6  PROT 5.8*  ALBUMIN 2.4*    Recent Labs Lab 09/24/16 0541  LIPASE 350*   No results for input(s): AMMONIA in the last 168 hours. Coagulation Profile: No results for input(s): INR, PROTIME in the last 168 hours. Cardiac Enzymes: No results for input(s): CKTOTAL, CKMB, CKMBINDEX, TROPONINI in the last 168 hours. BNP (last 3 results) No results for input(s): PROBNP in the last 8760 hours. HbA1C: No results for input(s): HGBA1C in the last 72 hours. CBG:  Recent Labs Lab 09/23/16 1708 09/23/16 2057 09/24/16 0311 09/24/16 0747 09/24/16 1142  GLUCAP 113* 132* 107* 102* 141*   Lipid Profile: No results for input(s): CHOL, HDL, LDLCALC, TRIG, CHOLHDL, LDLDIRECT in the last 72 hours. Thyroid Function Tests: No results for input(s): TSH, T4TOTAL, FREET4, T3FREE, THYROIDAB in the last 72 hours. Anemia Panel: No results for input(s): VITAMINB12, FOLATE, FERRITIN, TIBC, IRON, RETICCTPCT in the last 72 hours. Urine analysis:    Component Value Date/Time   COLORURINE AMBER (A) 09/18/2016 1348   APPEARANCEUR CLOUDY (A) 09/18/2016 1348   LABSPEC 1.024 09/18/2016 1348   PHURINE 5.0 09/18/2016 1348   GLUCOSEU NEGATIVE 09/18/2016 1348   GLUCOSEU NEG mg/dL 72/89/7915 0413   HGBUR NEGATIVE 09/18/2016 1348   HGBUR negative 07/02/2009 1003   BILIRUBINUR SMALL (A) 09/18/2016 1348   KETONESUR NEGATIVE 09/18/2016 1348   PROTEINUR NEGATIVE 09/18/2016 1348   UROBILINOGEN 0.2 03/05/2015 1854   NITRITE  NEGATIVE 09/18/2016 1348   LEUKOCYTESUR TRACE (A) 09/18/2016 1348   Sepsis Labs: @LABRCNTIP (procalcitonin:4,lacticidven:4)   Recent Results (from the past 240 hour(s))  MRSA PCR Screening     Status: Abnormal   Collection Time: 09/19/16  9:51 AM  Result Value Ref Range Status   MRSA by PCR POSITIVE (A) NEGATIVE Final      Radiology Studies: Dg Chest 2 View Result Date: 09/18/2016 No active cardiopulmonary disease. No evidence of pneumonia. Stable cardiomegaly.  US Renal Result Date: 09/18/2016 Exam is somewhat limited due to body habitus. No definite renal abnormality is noted.   Nm Pulmonary Perf And Vent Result Date: 09/18/2016 No evidence of pulmonary embolism.     Scheduled Meds: . allopurinol  100 mg Oral Q breakfast  . brimonidine  1 drop Left Eye QPC breakfast  . furosemide  80 mg Oral BID  . heparin  5,000 Units Subcutaneous Q12H  . insulin aspart  0-9 Units Subcutaneous TID WC  . metroNIDAZOLE  500 mg Oral Q8H  . mupirocin ointment  1 application Nasal BID  . potassium chloride  30 mEq Oral Once  . QUEtiapine  25 mg Oral BID  . senna-docusate  1 tablet Oral QHS  . sodium chloride flush  3 mL Intravenous Q12H   Continuous Infusions:     LOS: 6 days   Time spent: 35 minutes. Greater than 50% of the time spent on counseling and coordinating the care.  Hazeline Junker, MD [facility] Pager [phone]  If 7PM-7AM, please contact night-coverage [email or web address] Password [email or web address] 09/24/2016, 3:14 PM

## 2016-09-25 LAB — COMPREHENSIVE METABOLIC PANEL
ALBUMIN: 2.6 g/dL — AB (ref 3.5–5.0)
ALT: 12 U/L — ABNORMAL LOW (ref 14–54)
AST: 15 U/L (ref 15–41)
Alkaline Phosphatase: 68 U/L (ref 38–126)
Anion gap: 9 (ref 5–15)
BUN: 55 mg/dL — AB (ref 6–20)
CALCIUM: 8.6 mg/dL — AB (ref 8.9–10.3)
CHLORIDE: 105 mmol/L (ref 101–111)
CO2: 26 mmol/L (ref 22–32)
Creatinine, Ser: 2.43 mg/dL — ABNORMAL HIGH (ref 0.44–1.00)
GFR calc Af Amer: 21 mL/min — ABNORMAL LOW (ref >60)
GFR calc non Af Amer: 18 mL/min — ABNORMAL LOW (ref >60)
GLUCOSE: 104 mg/dL — AB (ref 65–99)
POTASSIUM: 3.6 mmol/L (ref 3.5–5.1)
Sodium: 140 mmol/L (ref 135–145)
TOTAL PROTEIN: 5.9 g/dL — AB (ref 6.5–8.1)
Total Bilirubin: 0.7 mg/dL (ref 0.3–1.2)

## 2016-09-25 LAB — CBC
HEMATOCRIT: 30.5 % — AB (ref 36.0–46.0)
Hemoglobin: 10.2 g/dL — ABNORMAL LOW (ref 12.0–15.0)
MCH: 31.8 pg (ref 26.0–34.0)
MCHC: 33.4 g/dL (ref 30.0–36.0)
MCV: 95 fL (ref 78.0–100.0)
PLATELETS: 320 10*3/uL (ref 150–400)
RBC: 3.21 MIL/uL — ABNORMAL LOW (ref 3.87–5.11)
RDW: 15.5 % (ref 11.5–15.5)
WBC: 14 10*3/uL — AB (ref 4.0–10.5)

## 2016-09-25 LAB — URINALYSIS, ROUTINE W REFLEX MICROSCOPIC
BILIRUBIN URINE: NEGATIVE
GLUCOSE, UA: NEGATIVE mg/dL
HGB URINE DIPSTICK: NEGATIVE
Ketones, ur: NEGATIVE mg/dL
Nitrite: NEGATIVE
PROTEIN: NEGATIVE mg/dL
SPECIFIC GRAVITY, URINE: 1.014 (ref 1.005–1.030)
pH: 5 (ref 5.0–8.0)

## 2016-09-25 LAB — URINE MICROSCOPIC-ADD ON: RBC / HPF: NONE SEEN RBC/hpf (ref 0–5)

## 2016-09-25 LAB — GLUCOSE, CAPILLARY
GLUCOSE-CAPILLARY: 105 mg/dL — AB (ref 65–99)
GLUCOSE-CAPILLARY: 107 mg/dL — AB (ref 65–99)
Glucose-Capillary: 122 mg/dL — ABNORMAL HIGH (ref 65–99)

## 2016-09-25 NOTE — Progress Notes (Signed)
LCSW following for discharge planning and disposition. Plan remains to the same:  Return to [facility] SNF at DC/ when medically stable.   Will continue to follow and assist with disposition.  Deretha Emory, MSW Clinical Social Work: Optician, dispensing Coverage for :  [phone]

## 2016-09-25 NOTE — Progress Notes (Signed)
Patient ID: DAUN PALMERTREE, female   DOB: 23-Jan-1936, 80 y.o.   MRN: [id number]  PROGRESS NOTE  GER TEIG  [id number] DOB: 10/06/1936 DOA: 09/18/2016  PCP: Georgann Housekeeper, MD  Brief Narrative:  [name] is a 80 y.o. female with past medical history significant for CAD, diabetes, hypertension, systolic congestive heart failure, CKD stage III, blindness who presented from [facility] assisted living for elevated creatinine. Creatinine was 5.98 on admission, from baseline of 1.7. She had experienced significant diarrhea in the setting of recent abx (levaquin and clindamycin) for PNA and UTI. C. diff on stool sample PTA was positive for C diff and flagyl had been started. She was admitted for IV fluids and ongoing antibiotics. She became fluid overloaded so fluids were stopped and she has undergone diuresis with increased doses of lasix from home dose with steady improvement in renal function. She developed worsening abdominal pain 10/4 and CT abdomen showed constipation and associated colonic inflammation as well as possible pancreatitis.   Assessment & Plan: Acute renal failure superimposed on CKD stage 3: Creatinine peak 5.98, baseline 1.7. Cause presumed to be prerenal initially due to dehydration from diarrhea causing oliguric ARF. IV fluids produced edema, so lasix (a home med) was started with significant diuresis and improving renal clearance. - Continue lasix 80mg  po BID. Will eventually return to home dose of 20mg  po.  - Monitor daily. - Nephrology has signed off. - No acute findings on renal US, ua + yeast  C. difficile colitis: Acute, associated with abx for PNA and UTI. Causing leukocytosis. GI pathogen panel neg here, but CDiff PCR report in chart from SNF is positive. Diarrhea has been resolved since admission. - Replace flagyl with po vancomycin (finish date 10/12) due to concern for pancreatitis - Contact precautions  Constipation: Now experiencing constipation  and CT abdomen with evidence of stercoral colitis. - Soap suds enema today, senna-ducosate daily  Acute pancreatitis: Mild. Lipase 350, epigastric tenderness without rebound/distention. Peripancreatic fatty edema noted on CT abd/pelvis. No pancreatic mass/ductal dilation or GB wall thickening despite cholelithiasis. ?due to constipation, or drug reaction to flagyl or increased lasix. Triglycerides 155 a couple weeks ago. Calcium mildly increased, doubt this has caused pancreatic inflammation. No EtOH history. - D/C flagyl, continue lasix taper as above - Hold IVF, but will monitor closely for 3rd spacing - Pt not eating - Morphine IV prn  Leukocytosis: Has been stable through admission, presumed to be reactive to stress. C diff is being treated and there are no other signs of infection. Possibly related to pancreatitis. - Blood cultures if becomes febrile - Monitor in AM  Chronic HFrEF due to NICM: EF 30-35% - Volume status improving with lasix, tapering as above  Controlled type 2 diabetes mellitus with blindness and proliferative retinopathy and diabetic nephropathy with long term insulin use: A1c on this admission 5.5, indicating good glycemic control - Continue SSI  Essential hypertension: Chronic. Controlled without home meds. - BP stable, will likely need to discontinue some of her home bp meds at discharge  Anemia in chronic kidney disease - Hemoglobin stable at 9.5  Hypokalemia - Due to lasix and diarrhea - Supplemented and WNL  FTT;  per physical therapy "Pt unable to stand with +2 assist, will need mechanical lift for OOB." - Continues to require SNF placement.  DVT prophylaxis while in the hospital: Heparin subQ Code Status: DNR/DNI  Family Communication: no family at the bedside this am Disposition Plan: SNF pending clinical improvement  Consultants:  Nephrology, Dr. Arlean HoppingSchertz    Procedures:   None   Antimicrobials:   Flagyl 09/18/2016 >> 09/24/2016  PO  Vancomycin 10/6 >> 10/12 to complete 14 day tx.  Subjective: Patient reports ongoing intermittent mid abdominal pain sometimes made worse by food, sometimes with position changes, but nothing always reproduces pain. No BM, denies getting enema yesterday.   Objective: Vitals:   09/24/16 1241 09/24/16 2148 09/25/16 0501 09/25/16 1526  BP:  (!) 118/36 (!) 126/58   Pulse:  87 89   Resp:  18 18   Temp:  99.4 F (37.4 C) 98.5 F (36.9 C)   TempSrc:  Oral Oral   SpO2:  98% 100%   Weight: 101.9 kg (224 lb 10.4 oz)   100.4 kg (221 lb 5.5 oz)  Height:        Intake/Output Summary (Last 24 hours) at 09/25/16 1538 Last data filed at 09/25/16 0935  Gross per 24 hour  Intake              240 ml  Output              850 ml  Net             -610 ml   Filed Weights   09/22/16 0602 09/24/16 1241 09/25/16 1526  Weight: 105.3 kg (232 lb 2.3 oz) 101.9 kg (224 lb 10.4 oz) 100.4 kg (221 lb 5.5 oz)    Examination:  General exam: Elderly female resting quietly on her back in no distress. Does not open eyes for conversation but responds appropriately, follows commands and fully oriented. Respiratory system: decreased respiratory effort with globally diminished breath sounds. No crackles or wheezes. Cardiovascular system: S1 & S2 heard, RRR. No JVD Gastrointestinal system: (+) BS, soft, obese, mild epigastric tenderness to deep palpation without rebound or guarding.  GU: Foley in place. Central nervous system: No focal deficits  Extremities: 1+ bilateral UE swelling noted, palpable pulses  Skin: no lesions or ulcers noted Psychiatry: Normal mood and behavior    Data Reviewed: I have personally reviewed following labs and imaging studies  CBC:  Recent Labs Lab 09/21/16 0546 09/22/16 0608 09/23/16 0620 09/24/16 0541 09/25/16 0500  WBC 11.4* 11.0* 10.7* 11.7* 14.0*  HGB 9.6* 9.5* 9.9* 9.7* 10.2*  HCT 28.1* 27.6* 29.4* 29.1* 30.5*  MCV 93.7 90.8 93.6 94.5 95.0  PLT 285 277 305 299 320    Basic Metabolic Panel:  Recent Labs Lab 09/21/16 0546 09/22/16 1001 09/23/16 0620 09/24/16 0541 09/25/16 0500  NA 140 140 139 139 140  K 3.8 4.6 3.6 3.8 3.6  CL 110 112* 106 106 105  CO2 20* 21* 24 25 26   GLUCOSE 111* 115* 105* 111* 104*  BUN 72* 65* 61* 57* 55*  CREATININE 3.93* 3.46* 3.01* 2.67* 2.43*  CALCIUM 8.1* 8.3* 8.5* 8.5* 8.6*   GFR: Estimated Creatinine Clearance: 22.3 mL/min (by C-G formula based on SCr of 2.43 mg/dL (H)). Liver Function Tests:  Recent Labs Lab 09/24/16 0541 09/25/16 0500  AST 19 15  ALT 14 12*  ALKPHOS 65 68  BILITOT 0.6 0.7  PROT 5.7* 5.9*  ALBUMIN 2.5* 2.6*    Recent Labs Lab 09/24/16 0541  LIPASE 350*   No results for input(s): AMMONIA in the last 168 hours. Coagulation Profile: No results for input(s): INR, PROTIME in the last 168 hours. Cardiac Enzymes: No results for input(s): CKTOTAL, CKMB, CKMBINDEX, TROPONINI in the last 168 hours. BNP (last 3 results) No results for input(s): PROBNP  in the last 8760 hours. HbA1C: No results for input(s): HGBA1C in the last 72 hours. CBG:  Recent Labs Lab 09/24/16 1712 09/24/16 2113 09/25/16 0359 09/25/16 0740 09/25/16 1236  GLUCAP 154* 138* 105* 122* 107*   Lipid Profile: No results for input(s): CHOL, HDL, LDLCALC, TRIG, CHOLHDL, LDLDIRECT in the last 72 hours. Thyroid Function Tests: No results for input(s): TSH, T4TOTAL, FREET4, T3FREE, THYROIDAB in the last 72 hours. Anemia Panel: No results for input(s): VITAMINB12, FOLATE, FERRITIN, TIBC, IRON, RETICCTPCT in the last 72 hours. Urine analysis:    Component Value Date/Time   COLORURINE AMBER (A) 09/18/2016 1348   APPEARANCEUR CLOUDY (A) 09/18/2016 1348   LABSPEC 1.024 09/18/2016 1348   PHURINE 5.0 09/18/2016 1348   GLUCOSEU NEGATIVE 09/18/2016 1348   GLUCOSEU NEG mg/dL 16/09/9603 5409   HGBUR NEGATIVE 09/18/2016 1348   HGBUR negative 07/02/2009 1003   BILIRUBINUR SMALL (A) 09/18/2016 1348   KETONESUR NEGATIVE  09/18/2016 1348   PROTEINUR NEGATIVE 09/18/2016 1348   UROBILINOGEN 0.2 03/05/2015 1854   NITRITE NEGATIVE 09/18/2016 1348   LEUKOCYTESUR TRACE (A) 09/18/2016 1348   Sepsis Labs: @LABRCNTIP (procalcitonin:4,lacticidven:4)   Recent Results (from the past 240 hour(s))  MRSA PCR Screening     Status: Abnormal   Collection Time: 09/19/16  9:51 AM  Result Value Ref Range Status   MRSA by PCR POSITIVE (A) NEGATIVE Final      Radiology Studies: Dg Chest 2 View Result Date: 09/18/2016 No active cardiopulmonary disease. No evidence of pneumonia. Stable cardiomegaly.  US Renal Result Date: 09/18/2016 Exam is somewhat limited due to body habitus. No definite renal abnormality is noted.   Nm Pulmonary Perf And Vent Result Date: 09/18/2016 No evidence of pulmonary embolism.     Scheduled Meds: . allopurinol  100 mg Oral Q breakfast  . brimonidine  1 drop Left Eye QPC breakfast  . furosemide  80 mg Oral BID  . heparin  5,000 Units Subcutaneous Q12H  . insulin aspart  0-9 Units Subcutaneous TID WC  . potassium chloride  30 mEq Oral Once  . QUEtiapine  25 mg Oral BID  . senna-docusate  1 tablet Oral QHS  . sodium chloride flush  3 mL Intravenous Q12H  . vancomycin  125 mg Oral QID   Continuous Infusions:     LOS: 7 days   Time spent: 25 minutes. Greater than 50% of the time spent on counseling and coordinating the care.  Hazeline Junker, MD [facility] Pager [phone]  If 7PM-7AM, please contact night-coverage [email or web address] Password [email or web address] 09/25/2016, 3:38 PM

## 2016-09-26 ENCOUNTER — Encounter (HOSPITAL_COMMUNITY): Payer: Self-pay | Admitting: Family Medicine

## 2016-09-26 ENCOUNTER — Inpatient Hospital Stay (HOSPITAL_COMMUNITY): Payer: Medicare Other

## 2016-09-26 DIAGNOSIS — K859 Acute pancreatitis without necrosis or infection, unspecified: Secondary | ICD-10-CM

## 2016-09-26 DIAGNOSIS — Z79899 Other long term (current) drug therapy: Secondary | ICD-10-CM

## 2016-09-26 DIAGNOSIS — Z888 Allergy status to other drugs, medicaments and biological substances status: Secondary | ICD-10-CM

## 2016-09-26 LAB — BASIC METABOLIC PANEL
Anion gap: 11 (ref 5–15)
BUN: 55 mg/dL — AB (ref 6–20)
CALCIUM: 8.5 mg/dL — AB (ref 8.9–10.3)
CO2: 23 mmol/L (ref 22–32)
CREATININE: 2.55 mg/dL — AB (ref 0.44–1.00)
Chloride: 105 mmol/L (ref 101–111)
GFR calc Af Amer: 20 mL/min — ABNORMAL LOW (ref >60)
GFR, EST NON AFRICAN AMERICAN: 17 mL/min — AB (ref >60)
GLUCOSE: 95 mg/dL (ref 65–99)
POTASSIUM: 3.6 mmol/L (ref 3.5–5.1)
SODIUM: 139 mmol/L (ref 135–145)

## 2016-09-26 LAB — GLUCOSE, CAPILLARY: GLUCOSE-CAPILLARY: 111 mg/dL — AB (ref 65–99)

## 2016-09-26 LAB — CBC
HEMATOCRIT: 32.4 % — AB (ref 36.0–46.0)
Hemoglobin: 10.7 g/dL — ABNORMAL LOW (ref 12.0–15.0)
MCH: 31.4 pg (ref 26.0–34.0)
MCHC: 33 g/dL (ref 30.0–36.0)
MCV: 95 fL (ref 78.0–100.0)
PLATELETS: 308 10*3/uL (ref 150–400)
RBC: 3.41 MIL/uL — ABNORMAL LOW (ref 3.87–5.11)
RDW: 15.9 % — AB (ref 11.5–15.5)
WBC: 20.2 10*3/uL — AB (ref 4.0–10.5)

## 2016-09-26 LAB — C-REACTIVE PROTEIN: CRP: 7.8 mg/dL — ABNORMAL HIGH (ref <1.0)

## 2016-09-26 LAB — LIPASE, BLOOD: Lipase: 102 U/L — ABNORMAL HIGH (ref 11–51)

## 2016-09-26 MED ORDER — FLUCONAZOLE 150 MG PO TABS: 150.0000 mg | ORAL_TABLET | Freq: Every day | ORAL | Status: DC

## 2016-09-26 MED ORDER — FUROSEMIDE 20 MG PO TABS
20.0000 mg | ORAL_TABLET | Freq: Two times a day (BID) | ORAL | Status: DC
  Administered 2016-09-26 – 2016-10-01 (×9): 20 mg via ORAL
  Filled 2016-09-26 (×10): qty 1

## 2016-09-26 MED ORDER — FLUCONAZOLE 150 MG PO TABS
150.0000 mg | ORAL_TABLET | Freq: Once | ORAL | Status: DC
  Filled 2016-09-26: qty 1

## 2016-09-26 MED ORDER — DEXTROSE-NACL 5-0.45 % IV SOLN
INTRAVENOUS | Status: DC
  Administered 2016-09-26 – 2016-09-30 (×5): via INTRAVENOUS

## 2016-09-26 NOTE — Progress Notes (Signed)
Patient ID: [name], female   DOB: November 09, 1936, 80 y.o.   MRN: [id number]  PROGRESS NOTE  FRANKYE SCHWEGEL  WUJ:[id number] DOB: Dec 05, 1936 DOA: 09/18/2016  PCP: Georgann Housekeeper, MD  Brief Narrative:  [name] is a 80 y.o. female with past medical history significant for CAD, diabetes, hypertension, systolic congestive heart failure, CKD stage III, blindness who presented from [facility] assisted living for elevated creatinine. Creatinine was 5.98 on admission, from baseline of 1.7. She had experienced significant diarrhea in the setting of recent abx (levaquin and clindamycin) for PNA and UTI. C. diff on stool sample PTA was positive for C diff and flagyl had been started. She was admitted for IV fluids and ongoing antibiotics. She became fluid overloaded so fluids were stopped and she has undergone diuresis with increased doses of lasix from home dose with steady improvement in renal function. She developed worsening abdominal pain 10/4 and CT abdomen showed constipation and associated colonic inflammation as well as possible pancreatitis.   Assessment & Plan: Acute renal failure superimposed on CKD stage 3: Creatinine peak 5.98, baseline 1.7. Cause presumed to be prerenal initially due to dehydration from diarrhea causing oliguric ARF. IV fluids produced edema, so lasix (a home med) was started with significant diuresis and improving renal clearance. - Creatinine bumped overnight and pt's had no po intake. Will decrease lasix to 20mg  po BID (home dose).  - Continue lasix 80mg  po BID. Will eventually return to home dose of 20mg  po.  - Monitor I/O, renal function daily.  - Nephrology has signed off. - No acute findings on renal US, ua + yeast  C. difficile colitis: Acute, associated with abx for PNA and UTI. Causing leukocytosis. GI pathogen panel neg here, CDiff PCR report in chart from SNF is positive. Diarrhea has been resolved since admission. - Replaced flagyl with po  vancomycin (finish date 10/12) due to concern for drug-induced pancreatitis - Contact precautions  Constipation: Now experiencing constipation and CT abdomen with evidence of stercoral colitis. - Soap suds enema successful 10/6 - Continue senna-ducosate daily  Acute pancreatitis: Mild. Lipase 350, epigastric tenderness without rebound/distention. Peripancreatic fatty edema noted on CT abd/pelvis. No pancreatic mass/ductal dilation or GB wall thickening despite cholelithiasis. ?due to constipation, or drug reaction to flagyl or increased lasix. Triglycerides 155 a couple weeks ago. Calcium mildly increased, doubt this has caused pancreatic inflammation. No EtOH history. - D/C flagyl, continue po vancomycin  - Maintenance IVF - NPO - Morphine IV prn  Leukocytosis: Has been stable through admission, worsening on 10/6-10/7 despite Tx for Cdiff. ?related to pancreatitis - Blood cultures and urine culture drawn 10/6 - UA 10/6 showed yeast, will treat - D/C'ed foley now that IV diuresis completed  Chronic HFrEF due to NICM: EF 30-35% - Volume status improving with lasix, tapered to home dose - Monitor volume status on maintenance IVF  Controlled type 2 diabetes mellitus with blindness and proliferative retinopathy and diabetic nephropathy with long term insulin use: A1c on this admission 5.5, indicating good glycemic control - Check CBG qAM  Essential hypertension: Chronic. Controlled without home meds. - BP stable, will likely need to discontinue some of her home bp meds at discharge  Anemia in chronic kidney disease: Hemoglobin stable - Monitor with CBC  Hypokalemia: Due to lasix and diarrhea - Supplemented and WNL  FTT;  per physical therapy "Pt unable to stand with +2 assist, will need mechanical lift for OOB." - Continues to require SNF placement.  DVT prophylaxis while  in the hospital: Heparin subQ Code Status: DNR/DNI  Family Communication: no family at the bedside this  am Disposition Plan: SNF pending clinical improvement  Consultants:   Nephrology, Dr. Arlean HoppingSchertz    ID, Dr. Ninetta LightsHatcher  Procedures:   None   Antimicrobials:   Flagyl 09/18/2016 >> 09/24/2016  PO Vancomycin 10/6 >> 10/12 to complete 14 day tx  Fluconazole on 9/29, 10/04, and 10/7  Subjective: Patient reports ongoing intermittent mid abdominal pain not significantly improved with BM yesterday. No appetite. No fevers, cough.   Objective: Vitals:   09/25/16 0501 09/25/16 1526 09/25/16 2158 09/26/16 0546  BP: (!) 126/58  (!) 134/57 122/66  Pulse: 89  94 88  Resp: 18  19 18   Temp: 98.5 F (36.9 C)  98.2 F (36.8 C) 99.2 F (37.3 C)  TempSrc: Oral  Oral Oral  SpO2: 100%  99% 99%  Weight:  100.4 kg (221 lb 5.5 oz)  98.6 kg (217 lb 6 oz)  Height:        Intake/Output Summary (Last 24 hours) at 09/26/16 0818 Last data filed at 09/25/16 2216  Gross per 24 hour  Intake              240 ml  Output              600 ml  Net             -360 ml   Filed Weights   09/24/16 1241 09/25/16 1526 09/26/16 0546  Weight: 101.9 kg (224 lb 10.4 oz) 100.4 kg (221 lb 5.5 oz) 98.6 kg (217 lb 6 oz)    Examination:  General exam: Elderly female resting quietly on her back in no distress. Does not open eyes for conversation but responds appropriately, follows commands and fully oriented. Respiratory system: decreased respiratory effort with globally diminished breath sounds. No crackles or wheezes. Cardiovascular system: S1 & S2 heard, RRR. No JVD Gastrointestinal system: (+) BS, soft, obese, tenderness to deep palpation without rebound or guarding along abdominal midline, mostly in epigastrium GU: No foley Central nervous system: Blind, no focal sensorimotor deficits noted. No aphasia.  Extremities: 1+ bilateral UE swelling noted, palpable pulses  Skin: no lesions or ulcers noted Psychiatry: Normal mood and behavior    Data Reviewed: I have personally reviewed following labs and imaging  studies  CBC:  Recent Labs Lab 09/22/16 0608 09/23/16 0620 09/24/16 0541 09/25/16 0500 09/26/16 0628  WBC 11.0* 10.7* 11.7* 14.0* 20.2*  HGB 9.5* 9.9* 9.7* 10.2* 10.7*  HCT 27.6* 29.4* 29.1* 30.5* 32.4*  MCV 90.8 93.6 94.5 95.0 95.0  PLT 277 305 299 320 308   Basic Metabolic Panel:  Recent Labs Lab 09/22/16 1001 09/23/16 0620 09/24/16 0541 09/25/16 0500 09/26/16 0628  NA 140 139 139 140 139  K 4.6 3.6 3.8 3.6 3.6  CL 112* 106 106 105 105  CO2 21* 24 25 26 23   GLUCOSE 115* 105* 111* 104* 95  BUN 65* 61* 57* 55* 55*  CREATININE 3.46* 3.01* 2.67* 2.43* 2.55*  CALCIUM 8.3* 8.5* 8.5* 8.6* 8.5*   GFR: Estimated Creatinine Clearance: 21.1 mL/min (by C-G formula based on SCr of 2.55 mg/dL (H)). Liver Function Tests:  Recent Labs Lab 09/24/16 0541 09/25/16 0500  AST 19 15  ALT 14 12*  ALKPHOS 65 68  BILITOT 0.6 0.7  PROT 5.7* 5.9*  ALBUMIN 2.5* 2.6*    Recent Labs Lab 09/24/16 0541  LIPASE 350*   No results for input(s): AMMONIA in the  last 168 hours. Coagulation Profile: No results for input(s): INR, PROTIME in the last 168 hours. Cardiac Enzymes: No results for input(s): CKTOTAL, CKMB, CKMBINDEX, TROPONINI in the last 168 hours. BNP (last 3 results) No results for input(s): PROBNP in the last 8760 hours. HbA1C: No results for input(s): HGBA1C in the last 72 hours. CBG:  Recent Labs Lab 09/24/16 2113 09/25/16 0359 09/25/16 0740 09/25/16 1236 09/26/16 0751  GLUCAP 138* 105* 122* 107* 111*   Lipid Profile: No results for input(s): CHOL, HDL, LDLCALC, TRIG, CHOLHDL, LDLDIRECT in the last 72 hours. Thyroid Function Tests: No results for input(s): TSH, T4TOTAL, FREET4, T3FREE, THYROIDAB in the last 72 hours. Anemia Panel: No results for input(s): VITAMINB12, FOLATE, FERRITIN, TIBC, IRON, RETICCTPCT in the last 72 hours. Urine analysis:    Component Value Date/Time   COLORURINE YELLOW 09/25/2016 1600   APPEARANCEUR CLOUDY (A) 09/25/2016 1600    LABSPEC 1.014 09/25/2016 1600   PHURINE 5.0 09/25/2016 1600   GLUCOSEU NEGATIVE 09/25/2016 1600   GLUCOSEU NEG mg/dL 81/85/9093 1121   HGBUR NEGATIVE 09/25/2016 1600   HGBUR negative 07/02/2009 1003   BILIRUBINUR NEGATIVE 09/25/2016 1600   KETONESUR NEGATIVE 09/25/2016 1600   PROTEINUR NEGATIVE 09/25/2016 1600   UROBILINOGEN 0.2 03/05/2015 1854   NITRITE NEGATIVE 09/25/2016 1600   LEUKOCYTESUR MODERATE (A) 09/25/2016 1600   Sepsis Labs: @LABRCNTIP (procalcitonin:4,lacticidven:4)   Recent Results (from the past 240 hour(s))  MRSA PCR Screening     Status: Abnormal   Collection Time: 09/19/16  9:51 AM  Result Value Ref Range Status   MRSA by PCR POSITIVE (A) NEGATIVE Final      Radiology Studies: Dg Chest 2 View Result Date: 09/18/2016 No active cardiopulmonary disease. No evidence of pneumonia. Stable cardiomegaly.  US Renal Result Date: 09/18/2016 Exam is somewhat limited due to body habitus. No definite renal abnormality is noted.   Nm Pulmonary Perf And Vent Result Date: 09/18/2016 No evidence of pulmonary embolism.     Scheduled Meds: . allopurinol  100 mg Oral Q breakfast  . brimonidine  1 drop Left Eye QPC breakfast  . fluconazole  150 mg Oral Once  . furosemide  20 mg Oral BID  . heparin  5,000 Units Subcutaneous Q12H  . potassium chloride  30 mEq Oral Once  . QUEtiapine  25 mg Oral BID  . senna-docusate  1 tablet Oral QHS  . sodium chloride flush  3 mL Intravenous Q12H  . vancomycin  125 mg Oral QID   Continuous Infusions: . dextrose 5 % and 0.45% NaCl       LOS: 8 days   Time spent: 25 minutes. Greater than 50% of the time spent on counseling and coordinating the care.  Hazeline Junker, MD [facility] Pager [phone]  If 7PM-7AM, please contact night-coverage [email or web address] Password [email or web address] 09/26/2016, 8:18 AM

## 2016-09-26 NOTE — Consult Note (Signed)
[facility] for Infectious Disease  Date of Admission:  09/18/2016  Date of Consult:  09/26/2016  Reason for Consult: leukocytosis Referring Physician: Bonner Puna  Impression/Recommendation Leukocytosis C diff Pancreatitis ARF (baseline Cr 1.7) DM2 with blindness.   Would continue her C diff therapy as planned Recheck her lipase Check abd films to make sure she is not impacted or having over flow. Or could consider CT Per nursing she is significantly less interactive.    Thank you for the consult,   Bobby Rumpf (pager) [phone] [email or web address]  [name] is an 80 y.o. female.  HPI: 80 yo F adm on 9-29 with acute renal failure (Cr 5) and diarrhea. She was started on flagyl by her SNF (recent tx for UTI and PNA with clinda, levaquin completed 9-24).   In hospital she was afebrile but had WBC of 16.3. Her C diff was positive and her Cr improved with hydration. She was continued on flagyl.  She was started on lasix on 10-2 due to volume overload.  By 10-5 she was found to have lipase 350 with abd tenderness. She also had changed from diarrhea to constipation. Her 10-4 CT showed: Mild thickening of the ascending colon contain a moderate degree of stool. Findings have the appearance of colitis possibly a stercoral colitis given the presence of the moderate volume of stool. Uncomplicated cholelithiasis. Peripancreatic fatty edema. Findings could be secondary to pancreatitis. Diffuse soft tissue induration suggestive of third spacing of fluid. He flagyl was changed to po vanco and her lasix tapered to try to decrease the likelihood of drug induced pancreatitis.   Her WBC is now 20.2. She has been afebrile. She has had BM post enema on 10-6. None today.   Past Medical History:  Diagnosis Date  . Anxiety   . Arthritis   . Blood transfusion without reported diagnosis   . Chronic diastolic CHF (congestive heart failure) ([place])   . Coronary artery disease    LHC 05/25/06: No renal artery stenosis, proximal LAD 40%, proximal D1 40%  . Diabetes mellitus    a1c 7.3 in 10/2011  . GERD (gastroesophageal reflux disease)   . GLAUCOMA 12/06/2008   Vision Loss right eye   . HYPERLIPIDEMIA 12/27/2006  . Hypertension   . NICM (nonischemic cardiomyopathy) ([place])   . OSA (obstructive sleep apnea) 03/04/2012  . Proliferative diabetic retinopathy 01/28/2009   Severe Disease with Near Complete Blindness.    . Schizophrenia ([place])   . Stage III chronic kidney disease 01/10/2007  . Systolic heart failure ([place])    Echo 10/09: EF 30-35%. Echo 2/13: EF 50-55%.  . Visual hallucinations 07/15/2011    Past Surgical History:  Procedure Laterality Date  . CORONARY ANGIOPLASTY WITH STENT PLACEMENT    . EYE SURGERY  2012   both eyes     Allergies  Allergen Reactions  . Heparin Other (See Comments)    hallucinations    . Lisinopril Cough    Medications:  Scheduled: . allopurinol  100 mg Oral Q breakfast  . brimonidine  1 drop Left Eye QPC breakfast  . fluconazole  150 mg Oral Once  . furosemide  20 mg Oral BID  . heparin  5,000 Units Subcutaneous Q12H  . potassium chloride  30 mEq Oral Once  . QUEtiapine  25 mg Oral BID  . senna-docusate  1 tablet Oral QHS  . sodium chloride flush  3 mL Intravenous Q12H  . vancomycin  125 mg Oral QID    Abtx:  Anti-infectives    Start     Dose/Rate Route Frequency Ordered Stop   09/26/16 1000  fluconazole (DIFLUCAN) tablet 150 mg  Status:  Discontinued     150 mg Oral Daily 09/26/16 0815 09/26/16 0816   09/26/16 0900  fluconazole (DIFLUCAN) tablet 150 mg     150 mg Oral Once 09/26/16 0816     09/24/16 1800  vancomycin (VANCOCIN) 50 mg/mL oral solution 125 mg     125 mg Oral 4 times daily 09/24/16 1552 10/01/16 1759   09/23/16 1600  fluconazole (DIFLUCAN) tablet 200 mg     200 mg Oral  Once 09/23/16 1451 09/23/16 1823   09/18/16 2200  metroNIDAZOLE (FLAGYL) tablet 500 mg  Status:  Discontinued     500 mg Oral Every 8  hours 09/18/16 1757 09/24/16 1551   09/18/16 1600  fluconazole (DIFLUCAN) tablet 150 mg     150 mg Oral  Once 09/18/16 1558 09/18/16 1605      Total days of antibiotics: 10 falgyl --> po vanco          Social History:  reports that she has never smoked. She has never used smokeless tobacco. She reports that she does not drink alcohol or use drugs.  Family History  Problem Relation Age of Onset  . Heart disease Maternal Grandmother   . Prostate cancer Father   . Colon cancer Maternal Aunt     General ROS: she does not awaken or interact except with abd exam. ROS unobtainable.   Blood pressure (!) 100/45, pulse 92, temperature 99.9 F (37.7 C), temperature source Oral, resp. rate 18, height '5\' 4"'$  (1.626 m), weight 98.6 kg (217 lb 6 oz), SpO2 97 %. General appearance: no distress and slowed mentation Eyes: dysconjugate gaze Throat: normal findings: oropharynx pink & moist without lesions or evidence of thrush Neck: no adenopathy and supple, symmetrical, trachea midline Lungs: clear to auscultation bilaterally Heart: regular rate and rhythm Abdomen: normal findings: bowel sounds normal and soft and abnormal findings:  Gaurding, tenderness in LLQ Extremities: anasraca   Results for orders placed or performed during the hospital encounter of 09/18/16 (from the past 48 hour(s))  Glucose, capillary     Status: Abnormal   Collection Time: 09/24/16  5:12 PM  Result Value Ref Range   Glucose-Capillary 154 (H) 65 - 99 mg/dL  Glucose, capillary     Status: Abnormal   Collection Time: 09/24/16  9:13 PM  Result Value Ref Range   Glucose-Capillary 138 (H) 65 - 99 mg/dL  Glucose, capillary     Status: Abnormal   Collection Time: 09/25/16  3:59 AM  Result Value Ref Range   Glucose-Capillary 105 (H) 65 - 99 mg/dL  CBC     Status: Abnormal   Collection Time: 09/25/16  5:00 AM  Result Value Ref Range   WBC 14.0 (H) 4.0 - 10.5 K/uL   RBC 3.21 (L) 3.87 - 5.11 MIL/uL   Hemoglobin 10.2 (L)  12.0 - 15.0 g/dL   HCT 30.5 (L) 36.0 - 46.0 %   MCV 95.0 78.0 - 100.0 fL   MCH 31.8 26.0 - 34.0 pg   MCHC 33.4 30.0 - 36.0 g/dL   RDW 15.5 11.5 - 15.5 %   Platelets 320 150 - 400 K/uL  Comprehensive metabolic panel     Status: Abnormal   Collection Time: 09/25/16  5:00 AM  Result Value Ref Range   Sodium 140 135 - 145 mmol/L   Potassium 3.6 3.5 - 5.1  mmol/L   Chloride 105 101 - 111 mmol/L   CO2 26 22 - 32 mmol/L   Glucose, Bld 104 (H) 65 - 99 mg/dL   BUN 55 (H) 6 - 20 mg/dL   Creatinine, Ser 2.43 (H) 0.44 - 1.00 mg/dL   Calcium 8.6 (L) 8.9 - 10.3 mg/dL   Total Protein 5.9 (L) 6.5 - 8.1 g/dL   Albumin 2.6 (L) 3.5 - 5.0 g/dL   AST 15 15 - 41 U/L   ALT 12 (L) 14 - 54 U/L   Alkaline Phosphatase 68 38 - 126 U/L   Total Bilirubin 0.7 0.3 - 1.2 mg/dL   GFR calc non Af Amer 18 (L) >60 mL/min   GFR calc Af Amer 21 (L) >60 mL/min    Comment: (NOTE) The eGFR has been calculated using the CKD EPI equation. This calculation has not been validated in all clinical situations. eGFR's persistently <60 mL/min signify possible Chronic Kidney Disease.    Anion gap 9 5 - 15  Glucose, capillary     Status: Abnormal   Collection Time: 09/25/16  7:40 AM  Result Value Ref Range   Glucose-Capillary 122 (H) 65 - 99 mg/dL  Glucose, capillary     Status: Abnormal   Collection Time: 09/25/16 12:36 PM  Result Value Ref Range   Glucose-Capillary 107 (H) 65 - 99 mg/dL  Urinalysis, Routine w reflex microscopic (not at [facility])     Status: Abnormal   Collection Time: 09/25/16  4:00 PM  Result Value Ref Range   Color, Urine YELLOW YELLOW   APPearance CLOUDY (A) CLEAR   Specific Gravity, Urine 1.014 1.005 - 1.030   pH 5.0 5.0 - 8.0   Glucose, UA NEGATIVE NEGATIVE mg/dL   Hgb urine dipstick NEGATIVE NEGATIVE   Bilirubin Urine NEGATIVE NEGATIVE   Ketones, ur NEGATIVE NEGATIVE mg/dL   Protein, ur NEGATIVE NEGATIVE mg/dL   Nitrite NEGATIVE NEGATIVE   Leukocytes, UA MODERATE (A) NEGATIVE  Urine  microscopic-add on     Status: Abnormal   Collection Time: 09/25/16  4:00 PM  Result Value Ref Range   Squamous Epithelial / LPF 6-30 (A) NONE SEEN   WBC, UA 0-5 0 - 5 WBC/hpf   RBC / HPF NONE SEEN 0 - 5 RBC/hpf   Bacteria, UA RARE (A) NONE SEEN   Urine-Other YEAST PRESENT   Culture, blood (Routine X 2) w Reflex to ID Panel     Status: None (Preliminary result)   Collection Time: 09/25/16  6:12 PM  Result Value Ref Range   Specimen Description BLOOD LEFT HAND    Special Requests IN PEDIATRIC BOTTLE 1CC    Culture      NO GROWTH < 24 HOURS Performed at [facility]    Report Status PENDING   Culture, blood (Routine X 2) w Reflex to ID Panel     Status: None (Preliminary result)   Collection Time: 09/25/16  6:12 PM  Result Value Ref Range   Specimen Description BLOOD RIGHT HAND    Special Requests BOTTLES DRAWN AEROBIC ONLY 5CC    Culture      NO GROWTH < 24 HOURS Performed at [facility]    Report Status PENDING   Basic metabolic panel     Status: Abnormal   Collection Time: 09/26/16  6:28 AM  Result Value Ref Range   Sodium 139 135 - 145 mmol/L   Potassium 3.6 3.5 - 5.1 mmol/L   Chloride 105 101 - 111 mmol/L  CO2 23 22 - 32 mmol/L   Glucose, Bld 95 65 - 99 mg/dL   BUN 55 (H) 6 - 20 mg/dL   Creatinine, Ser 2.55 (H) 0.44 - 1.00 mg/dL   Calcium 8.5 (L) 8.9 - 10.3 mg/dL   GFR calc non Af Amer 17 (L) >60 mL/min   GFR calc Af Amer 20 (L) >60 mL/min    Comment: (NOTE) The eGFR has been calculated using the CKD EPI equation. This calculation has not been validated in all clinical situations. eGFR's persistently <60 mL/min signify possible Chronic Kidney Disease.    Anion gap 11 5 - 15  CBC     Status: Abnormal   Collection Time: 09/26/16  6:28 AM  Result Value Ref Range   WBC 20.2 (H) 4.0 - 10.5 K/uL   RBC 3.41 (L) 3.87 - 5.11 MIL/uL   Hemoglobin 10.7 (L) 12.0 - 15.0 g/dL   HCT 32.4 (L) 36.0 - 46.0 %   MCV 95.0 78.0 - 100.0 fL   MCH 31.4 26.0 - 34.0 pg     MCHC 33.0 30.0 - 36.0 g/dL   RDW 15.9 (H) 11.5 - 15.5 %   Platelets 308 150 - 400 K/uL  Glucose, capillary     Status: Abnormal   Collection Time: 09/26/16  7:51 AM  Result Value Ref Range   Glucose-Capillary 111 (H) 65 - 99 mg/dL   Comment 1 Notify RN       Component Value Date/Time   SDES BLOOD LEFT HAND 09/25/2016 1812   SDES BLOOD RIGHT HAND 09/25/2016 1812   SPECREQUEST IN PEDIATRIC BOTTLE 1CC 09/25/2016 1812   SPECREQUEST BOTTLES DRAWN AEROBIC ONLY 5CC 09/25/2016 1812   CULT  09/25/2016 1812    NO GROWTH < 24 HOURS Performed at [place]  09/25/2016 1812    NO GROWTH < 24 HOURS Performed at [place] PENDING 09/25/2016 1812   REPTSTATUS PENDING 09/25/2016 1812   No results found. Recent Results (from the past 240 hour(s))  MRSA PCR Screening     Status: Abnormal   Collection Time: 09/19/16  9:51 AM  Result Value Ref Range Status   MRSA by PCR POSITIVE (A) NEGATIVE Final    Comment:        The GeneXpert MRSA Assay (FDA approved for NASAL specimens only), is one component of a comprehensive MRSA colonization surveillance program. It is not intended to diagnose MRSA infection nor to guide or monitor treatment for MRSA infections. RESULT CALLED TO, READ BACK BY AND VERIFIED WITH: G RIMANDO AT 0528 ON 10.01.2017 BY NBROOKS   Gastrointestinal Panel by PCR , Stool     Status: None   Collection Time: 09/20/16 12:53 PM  Result Value Ref Range Status   Campylobacter species NOT DETECTED NOT DETECTED Final   Plesimonas shigelloides NOT DETECTED NOT DETECTED Final   Salmonella species NOT DETECTED NOT DETECTED Final   Yersinia enterocolitica NOT DETECTED NOT DETECTED Final   Vibrio species NOT DETECTED NOT DETECTED Final   Vibrio cholerae NOT DETECTED NOT DETECTED Final   Enteroaggregative E coli (EAEC) NOT DETECTED NOT DETECTED Final   Enteropathogenic E coli (EPEC) NOT DETECTED NOT DETECTED Final   Enterotoxigenic E coli  (ETEC) NOT DETECTED NOT DETECTED Final   Shiga like toxin producing E coli (STEC) NOT DETECTED NOT DETECTED Final   E. coli O157 NOT DETECTED NOT DETECTED Final   Shigella/Enteroinvasive E coli (EIEC) NOT DETECTED NOT DETECTED Final  Cryptosporidium NOT DETECTED NOT DETECTED Final   Cyclospora cayetanensis NOT DETECTED NOT DETECTED Final   Entamoeba histolytica NOT DETECTED NOT DETECTED Final   Giardia lamblia NOT DETECTED NOT DETECTED Final   Adenovirus F40/41 NOT DETECTED NOT DETECTED Final   Astrovirus NOT DETECTED NOT DETECTED Final   Norovirus GI/GII NOT DETECTED NOT DETECTED Final   Rotavirus A NOT DETECTED NOT DETECTED Final   Sapovirus (I, II, IV, and V) NOT DETECTED NOT DETECTED Final  Culture, blood (Routine X 2) w Reflex to ID Panel     Status: None (Preliminary result)   Collection Time: 09/25/16  6:12 PM  Result Value Ref Range Status   Specimen Description BLOOD LEFT HAND  Final   Special Requests IN PEDIATRIC BOTTLE 1CC  Final   Culture   Final    NO GROWTH < 24 HOURS Performed at [facility]    Report Status PENDING  Incomplete  Culture, blood (Routine X 2) w Reflex to ID Panel     Status: None (Preliminary result)   Collection Time: 09/25/16  6:12 PM  Result Value Ref Range Status   Specimen Description BLOOD RIGHT HAND  Final   Special Requests BOTTLES DRAWN AEROBIC ONLY 5CC  Final   Culture   Final    NO GROWTH < 24 HOURS Performed at [facility]    Report Status PENDING  Incomplete      09/26/2016, 3:01 PM     LOS: 8 days    Records and images were personally reviewed where available.

## 2016-09-27 LAB — CBC
HEMATOCRIT: 26.9 % — AB (ref 36.0–46.0)
Hemoglobin: 8.9 g/dL — ABNORMAL LOW (ref 12.0–15.0)
MCH: 31.9 pg (ref 26.0–34.0)
MCHC: 33.1 g/dL (ref 30.0–36.0)
MCV: 96.4 fL (ref 78.0–100.0)
PLATELETS: 285 10*3/uL (ref 150–400)
RBC: 2.79 MIL/uL — ABNORMAL LOW (ref 3.87–5.11)
RDW: 15.8 % — AB (ref 11.5–15.5)
WBC: 13.1 10*3/uL — AB (ref 4.0–10.5)

## 2016-09-27 LAB — COMPREHENSIVE METABOLIC PANEL
ALK PHOS: 55 U/L (ref 38–126)
ALT: 10 U/L — ABNORMAL LOW (ref 14–54)
ANION GAP: 9 (ref 5–15)
AST: 16 U/L (ref 15–41)
Albumin: 2.4 g/dL — ABNORMAL LOW (ref 3.5–5.0)
BILIRUBIN TOTAL: 0.6 mg/dL (ref 0.3–1.2)
BUN: 55 mg/dL — ABNORMAL HIGH (ref 6–20)
CALCIUM: 8.1 mg/dL — AB (ref 8.9–10.3)
CO2: 24 mmol/L (ref 22–32)
Chloride: 104 mmol/L (ref 101–111)
Creatinine, Ser: 2.31 mg/dL — ABNORMAL HIGH (ref 0.44–1.00)
GFR, EST AFRICAN AMERICAN: 22 mL/min — AB (ref >60)
GFR, EST NON AFRICAN AMERICAN: 19 mL/min — AB (ref >60)
GLUCOSE: 147 mg/dL — AB (ref 65–99)
Potassium: 3.3 mmol/L — ABNORMAL LOW (ref 3.5–5.1)
Sodium: 137 mmol/L (ref 135–145)
TOTAL PROTEIN: 5.3 g/dL — AB (ref 6.5–8.1)

## 2016-09-27 LAB — URINE CULTURE

## 2016-09-27 LAB — GLUCOSE, CAPILLARY: GLUCOSE-CAPILLARY: 154 mg/dL — AB (ref 65–99)

## 2016-09-27 NOTE — Progress Notes (Signed)
Initial Nutrition Assessment  DOCUMENTATION CODES:   Obesity unspecified  INTERVENTION:  Advance diet per MD.   RD will continue to monitor for nutrition-related needs.   NUTRITION DIAGNOSIS:   Inadequate oral intake related to inability to eat as evidenced by NPO status.  GOAL:   Patient will meet greater than or equal to 90% of their needs  MONITOR:   Diet advancement, Labs, I & O's, Weight trends  REASON FOR ASSESSMENT:   Malnutrition Screening Tool    ASSESSMENT:   80 y.o.femalewith past medical history significant for CAD, diabetes, hypertension, systolic congestive heart failure, CKD stage III, blindness who presented from [facility] assisted living for elevated creatinine. Creatinine was 5.98 on admission, from baseline of 1.7. She had experienced significant diarrhea in the setting of recent abx (levaquin and clindamycin) for PNA and UTI. C. diff on stool sample PTA was positive for C diff and flagyl had been started. She was admitted for IV fluids and ongoing antibiotics.   -Pt became fluid overloaded so fluids stopped - undergoing diuresis with increased doses of lasix. -Developed worsening abdominal pain 10/4. CT abdomen showing constipation and colonic inflammation, possible pancreatitis. -Pt now NPO since 10/7 for mild acute pancreatitis.   Patient reports her appetite has been poor (anorexia - lack of hunger) for about 3 weeks PTA. She reports eating <25% of usual intake. She has had diarrhea from the antibiotics, worsening appetite. She reports UBW of 217 lbs, but is unsure if she has been losing weight since she has been holding onto fluids in hands, legs, and feet. There is not good evidence of weight history in medical chart. During this admission her weight has fluctuated significantly in setting of fluid overload and diuresis with Lasix. She has had insignificant weight loss of 5 lbs (2% body weight) in the past year. Volume overload likely masking acute  weight loss with poor intake in past 3 weeks.  Meal Completion: 0-25% when on Renal/CHO Mod diet. Patient now NPO.  Medications reviewed and include: Lasix 20 mg BID (missing doses now as she is NPO), senna, D5-1/2NS @ 75 ml/hr (90 grams dextrose, 306 kcal daily).  Labs reviewed: CBG 154 past 24 hrs, HgbA1c 5.5, Potassium 3.3, BUN 55, Creatinine 2.31, Lipase 102.  Nutrition-Focused physical exam completed. Findings are no fat depletion, no muscle depletion, and severe edema. RD assessment of edema does not align with that recorded by RN so may be incorrect, but patient appears to be very volume overloaded to this RD.  Discussed with RN.   Diet Order:  Diet NPO time specified  Skin:  Reviewed, no issues  Last BM:  09/26/2016  Height:   Ht Readings from Last 1 Encounters:  09/20/16 5\' 4"  (1.626 m)    Weight:   Wt Readings from Last 1 Encounters:  09/27/16 218 lb 14.7 oz (99.3 kg)    Ideal Body Weight:  54.54 kg  BMI:  Body mass index is 37.58 kg/m.  Estimated Nutritional Needs:   Kcal:  1800-2000  Protein:  100-120 grams  Fluid:  1.8-2 L/day or per MD in setting of volume overload  EDUCATION NEEDS:   No education needs identified at this time  Helane Rima, MS, RD, LDN Pager: [phone] After Hours Pager: [phone]

## 2016-09-27 NOTE — Progress Notes (Signed)
Patient ID: [name], female   DOB: 02-12-1936, 80 y.o.   MRN: [id number]  PROGRESS NOTE  [name]  NWG:[id number] DOB: Apr 26, 1936 DOA: 09/18/2016  PCP: Georgann Housekeeper, MD  Brief Narrative:  [name] is a 80 y.o. female with past medical history significant for CAD, diabetes, hypertension, systolic congestive heart failure, CKD stage III, blindness who presented from [facility] assisted living for elevated creatinine. Creatinine was 5.98 on admission, from baseline of 1.7. She had experienced significant diarrhea in the setting of recent abx (levaquin and clindamycin) for PNA and UTI. C. diff on stool sample PTA was positive for C diff and flagyl had been started. She was admitted for IV fluids and ongoing antibiotics. She became fluid overloaded so fluids were stopped and she has undergone diuresis with increased doses of lasix from home dose with steady improvement in renal function. She developed worsening abdominal pain 10/4 and CT abdomen showed constipation and associated colonic inflammation as well as possible pancreatitis.   Assessment & Plan: Acute renal failure superimposed on CKD stage 3: Creatinine peak 5.98, baseline 1.7. Cause presumed to be prerenal initially due to dehydration from diarrhea causing oliguric ARF. IV fluids produced edema, so lasix (a home med) was started with significant diuresis and improving renal clearance. - Creatinine bumped overnight and pt's had no po intake. Will decrease lasix to 20mg  po BID (home dose).  - Continue lasix 80mg  po BID. Will eventually return to home dose of 20mg  po.  - Monitor I/O, renal function daily.  - Nephrology has signed off. - No acute findings on renal US, ua + yeast  C. difficile colitis: Acute, associated with abx for PNA and UTI. Causing leukocytosis. GI pathogen panel neg here, CDiff PCR report in chart from SNF is positive. Diarrhea has been resolved since admission. - Replaced flagyl with po  vancomycin (finish date 10/12) due to concern for drug-induced pancreatitis - Contact precautions  Constipation: Now experiencing constipation and CT abdomen with evidence of stercoral colitis. - Soap suds enema successful 10/6 - Continue senna-ducosate daily - Nonobstructive bowel pattern on XR 10/7  Acute pancreatitis: Lipase improving 250 > 102.  - Maintenance IVF - NPO - Morphine IV prn  Leukocytosis: Improving. Likely related to pancreatitis - Blood cultures and urine culture drawn 10/6 - UA 10/6 showed yeast, will treat - D/C'ed foley now that IV diuresis completed  FTT: per physical therapy "Pt unable to stand with +2 assist, will need mechanical lift for OOB." - Continues to require SNF placement. - Recommend outpatient palliative care consultation  Chronic HFrEF due to NICM: EF 30-35% - Volume status improving with lasix, tapered to home dose - Monitor volume status on maintenance IVF  Controlled type 2 diabetes mellitus with blindness and proliferative retinopathy and diabetic nephropathy with long term insulin use: A1c on this admission 5.5, indicating good glycemic control - Check CBG qAM  Essential hypertension: Chronic. Controlled without home meds. - BP stable, will likely need to discontinue some of her home bp meds at discharge  Anemia in chronic kidney disease: Hemoglobin stable - Monitor with CBC  Hypokalemia: Due to lasix and diarrhea - Supplemented and WNL  DVT prophylaxis while in the hospital: Heparin subQ Code Status: DNR/DNI  Family Communication: Called Daughter listed in EMR, [place], without answer or voicemail. Disposition Plan: SNF pending clinical improvement  Consultants:   Nephrology, Dr. Arlean Hopping    ID, Dr. Ninetta Lights  Procedures:   None   Antimicrobials:   Flagyl 09/18/2016 >>  09/24/2016  PO Vancomycin 10/6 >> 10/12 to complete 14 day tx  Fluconazole on 9/29, 10/04, and 10/7  Subjective: Patient reports improved pain, feels  better overall. Awakens only transiently. Is not hungry.   Objective: Vitals:   09/26/16 1430 09/26/16 2002 09/27/16 0521 09/27/16 1500  BP: (!) 100/45 (!) 117/44 (!) 116/48 (!) 122/39  Pulse: 92 86 83 79  Resp: 18 19 19 18   Temp: 99.9 F (37.7 C) 98 F (36.7 C) 98.2 F (36.8 C) 98.4 F (36.9 C)  TempSrc: Oral Oral Oral Oral  SpO2: 97% 98% 100% 99%  Weight:   99.3 kg (218 lb 14.7 oz)   Height:        Intake/Output Summary (Last 24 hours) at 09/27/16 1532 Last data filed at 09/27/16 0600  Gross per 24 hour  Intake          1726.16 ml  Output              200 ml  Net          1526.16 ml   Filed Weights   09/25/16 1526 09/26/16 0546 09/27/16 0521  Weight: 100.4 kg (221 lb 5.5 oz) 98.6 kg (217 lb 6 oz) 99.3 kg (218 lb 14.7 oz)    Examination: General exam: Elderly female resting quietly on her back in no distress. Does not open eyes for conversation but responds appropriately, follows commands and fully oriented. Respiratory system: decreased respiratory effort with globally diminished breath sounds. No crackles or wheezes. Cardiovascular system: S1 & S2 heard, RRR. No JVD Gastrointestinal system: (+) BS, soft, obese, tenderness to deep palpation without rebound or guarding along abdominal midline, mostly in epigastrium Central nervous system: Blind, no focal sensorimotor deficits noted. No aphasia.  Extremities: 1+ bilateral UE swelling noted, palpable pulses. Trace BL LE edema Skin: no lesions or ulcers noted Psychiatry: Normal mood and behavior    Data Reviewed: I have personally reviewed following labs and imaging studies  CBC:  Recent Labs Lab 09/23/16 0620 09/24/16 0541 09/25/16 0500 09/26/16 0628 09/27/16 0618  WBC 10.7* 11.7* 14.0* 20.2* 13.1*  HGB 9.9* 9.7* 10.2* 10.7* 8.9*  HCT 29.4* 29.1* 30.5* 32.4* 26.9*  MCV 93.6 94.5 95.0 95.0 96.4  PLT 305 299 320 308 285   Basic Metabolic Panel:  Recent Labs Lab 09/23/16 0620 09/24/16 0541 09/25/16 0500  09/26/16 0628 09/27/16 0618  NA 139 139 140 139 137  K 3.6 3.8 3.6 3.6 3.3*  CL 106 106 105 105 104  CO2 24 25 26 23 24   GLUCOSE 105* 111* 104* 95 147*  BUN 61* 57* 55* 55* 55*  CREATININE 3.01* 2.67* 2.43* 2.55* 2.31*  CALCIUM 8.5* 8.5* 8.6* 8.5* 8.1*   GFR: Estimated Creatinine Clearance: 23.3 mL/min (by C-G formula based on SCr of 2.31 mg/dL (H)). Liver Function Tests:  Recent Labs Lab 09/24/16 0541 09/25/16 0500 09/27/16 0618  AST 19 15 16   ALT 14 12* 10*  ALKPHOS 65 68 55  BILITOT 0.6 0.7 0.6  PROT 5.7* 5.9* 5.3*  ALBUMIN 2.5* 2.6* 2.4*    Recent Labs Lab 09/24/16 0541 09/26/16 1812  LIPASE 350* 102*   No results for input(s): AMMONIA in the last 168 hours. Coagulation Profile: No results for input(s): INR, PROTIME in the last 168 hours. Cardiac Enzymes: No results for input(s): CKTOTAL, CKMB, CKMBINDEX, TROPONINI in the last 168 hours. BNP (last 3 results) No results for input(s): PROBNP in the last 8760 hours. HbA1C: No results for input(s): HGBA1C in  the last 72 hours. CBG:  Recent Labs Lab 09/25/16 0359 09/25/16 0740 09/25/16 1236 09/26/16 0751 09/27/16 0724  GLUCAP 105* 122* 107* 111* 154*   Lipid Profile: No results for input(s): CHOL, HDL, LDLCALC, TRIG, CHOLHDL, LDLDIRECT in the last 72 hours. Thyroid Function Tests: No results for input(s): TSH, T4TOTAL, FREET4, T3FREE, THYROIDAB in the last 72 hours. Anemia Panel: No results for input(s): VITAMINB12, FOLATE, FERRITIN, TIBC, IRON, RETICCTPCT in the last 72 hours. Urine analysis:    Component Value Date/Time   COLORURINE YELLOW 09/25/2016 1600   APPEARANCEUR CLOUDY (A) 09/25/2016 1600   LABSPEC 1.014 09/25/2016 1600   PHURINE 5.0 09/25/2016 1600   GLUCOSEU NEGATIVE 09/25/2016 1600   GLUCOSEU NEG mg/dL 16/10/960402/18/2009 [id number]   HGBUR NEGATIVE 09/25/2016 1600   HGBUR negative 07/02/2009 1003   BILIRUBINUR NEGATIVE 09/25/2016 1600   KETONESUR NEGATIVE 09/25/2016 1600   PROTEINUR NEGATIVE  09/25/2016 1600   UROBILINOGEN 0.2 03/05/2015 1854   NITRITE NEGATIVE 09/25/2016 1600   LEUKOCYTESUR MODERATE (A) 09/25/2016 1600   Sepsis Labs: @LABRCNTIP (procalcitonin:4,lacticidven:4)   Recent Results (from the past 240 hour(s))  MRSA PCR Screening     Status: Abnormal   Collection Time: 09/19/16  9:51 AM  Result Value Ref Range Status   MRSA by PCR POSITIVE (A) NEGATIVE Final      Radiology Studies: Dg Chest 2 View Result Date: 09/18/2016 No active cardiopulmonary disease. No evidence of pneumonia. Stable cardiomegaly.  [place] Renal Result Date: 09/18/2016 Exam is somewhat limited due to body habitus. No definite renal abnormality is noted.   Nm Pulmonary Perf And Vent Result Date: 09/18/2016 No evidence of pulmonary embolism.   Scheduled Meds: . allopurinol  100 mg Oral Q breakfast  . brimonidine  1 drop Left Eye QPC breakfast  . fluconazole  150 mg Oral Once  . furosemide  20 mg Oral BID  . heparin  5,000 Units Subcutaneous Q12H  . potassium chloride  30 mEq Oral Once  . QUEtiapine  25 mg Oral BID  . senna-docusate  1 tablet Oral QHS  . sodium chloride flush  3 mL Intravenous Q12H  . vancomycin  125 mg Oral QID   Continuous Infusions: . dextrose 5 % and 0.45% NaCl 75 mL/hr at 09/27/16 0045    LOS: 9 days   Time spent: 25 minutes. Greater than 50% of the time spent on counseling and coordinating the care.  [name] Junkeryan Grunz, MD [facility] Pager [phone]  If 7PM-7AM, please contact night-coverage [email or web address] Password [email or web address] 09/27/2016, 3:32 PM

## 2016-09-27 NOTE — Progress Notes (Signed)
INFECTIOUS DISEASE PROGRESS NOTE  ID: [name] is a 80 y.o. female with  Principal Problem:   Acute on chronic renal failure (HCC) Active Problems:   Type 2 diabetes mellitus with blindness and proliferative retinopathy (HCC)   Proliferative diabetic retinopathy (HCC)   Essential hypertension   Coronary atherosclerosis   Chronic diastolic heart failure (HCC)   Stage III chronic kidney disease   Anemia in chronic kidney disease   C. difficile diarrhea   Leukocytosis   Acute renal failure (ARF) (HCC)   Acute renal failure superimposed on stage 3 chronic kidney disease (HCC)   Hypokalemia   Drug-induced acute pancreatitis without infection or necrosis   Muscle weakness (generalized)  Subjective: awakens very transiently.   Abtx:  Anti-infectives    Start     Dose/Rate Route Frequency Ordered Stop   09/26/16 1000  fluconazole (DIFLUCAN) tablet 150 mg  Status:  Discontinued     150 mg Oral Daily 09/26/16 0815 09/26/16 0816   09/26/16 0900  fluconazole (DIFLUCAN) tablet 150 mg     150 mg Oral Once 09/26/16 0816     09/24/16 1800  vancomycin (VANCOCIN) 50 mg/mL oral solution 125 mg     125 mg Oral 4 times daily 09/24/16 1552 10/01/16 1759   09/23/16 1600  fluconazole (DIFLUCAN) tablet 200 mg     200 mg Oral  Once 09/23/16 1451 09/23/16 1823   09/18/16 2200  metroNIDAZOLE (FLAGYL) tablet 500 mg  Status:  Discontinued     500 mg Oral Every 8 hours 09/18/16 1757 09/24/16 1551   09/18/16 1600  fluconazole (DIFLUCAN) tablet 150 mg     150 mg Oral  Once 09/18/16 1558 09/18/16 1605      Medications:  Scheduled: . allopurinol  100 mg Oral Q breakfast  . brimonidine  1 drop Left Eye QPC breakfast  . fluconazole  150 mg Oral Once  . furosemide  20 mg Oral BID  . heparin  5,000 Units Subcutaneous Q12H  . potassium chloride  30 mEq Oral Once  . QUEtiapine  25 mg Oral BID  . senna-docusate  1 tablet Oral QHS  . sodium chloride flush  3 mL Intravenous Q12H  . vancomycin   125 mg Oral QID    Objective: Vital signs in last 24 hours: Temp:  [98 F (36.7 C)-99.9 F (37.7 C)] 98.2 F (36.8 C) (10/08 0521) Pulse Rate:  [83-92] 83 (10/08 0521) Resp:  [18-19] 19 (10/08 0521) BP: (100-117)/(44-48) 116/48 (10/08 0521) SpO2:  [97 %-100 %] 100 % (10/08 0521) Weight:  [99.3 kg (218 lb 14.7 oz)] 99.3 kg (218 lb 14.7 oz) (10/08 0521)   General appearance: no distress and slowed mentation Resp: clear to auscultation bilaterally Cardio: regular rate and rhythm GI: normal findings: bowel sounds normal and soft, non-tender  Lab Results  Recent Labs  09/26/16 0628 09/27/16 0618  WBC 20.2* 13.1*  HGB 10.7* 8.9*  HCT 32.4* 26.9*  NA 139 137  K 3.6 3.3*  CL 105 104  CO2 23 24  BUN 55* 55*  CREATININE 2.55* 2.31*   Liver Panel  Recent Labs  09/25/16 0500 09/27/16 0618  PROT 5.9* 5.3*  ALBUMIN 2.6* 2.4*  AST 15 16  ALT 12* 10*  ALKPHOS 68 55  BILITOT 0.7 0.6   Sedimentation Rate No results for input(s): ESRSEDRATE in the last 72 hours. C-Reactive Protein  Recent Labs  09/26/16 1143  CRP 7.8*    Microbiology: Recent Results (from the past  240 hour(s))  MRSA PCR Screening     Status: Abnormal   Collection Time: 09/19/16  9:51 AM  Result Value Ref Range Status   MRSA by PCR POSITIVE (A) NEGATIVE Final    Comment:        The GeneXpert MRSA Assay (FDA approved for NASAL specimens only), is one component of a comprehensive MRSA colonization surveillance program. It is not intended to diagnose MRSA infection nor to guide or monitor treatment for MRSA infections. RESULT CALLED TO, READ BACK BY AND VERIFIED WITH: G RIMANDO AT 0528 ON 10.01.2017 BY NBROOKS   Gastrointestinal Panel by PCR , Stool     Status: None   Collection Time: 09/20/16 12:53 PM  Result Value Ref Range Status   Campylobacter species NOT DETECTED NOT DETECTED Final   Plesimonas shigelloides NOT DETECTED NOT DETECTED Final   Salmonella species NOT DETECTED NOT  DETECTED Final   Yersinia enterocolitica NOT DETECTED NOT DETECTED Final   Vibrio species NOT DETECTED NOT DETECTED Final   Vibrio cholerae NOT DETECTED NOT DETECTED Final   Enteroaggregative E coli (EAEC) NOT DETECTED NOT DETECTED Final   Enteropathogenic E coli (EPEC) NOT DETECTED NOT DETECTED Final   Enterotoxigenic E coli (ETEC) NOT DETECTED NOT DETECTED Final   Shiga like toxin producing E coli (STEC) NOT DETECTED NOT DETECTED Final   E. coli O157 NOT DETECTED NOT DETECTED Final   Shigella/Enteroinvasive E coli (EIEC) NOT DETECTED NOT DETECTED Final   Cryptosporidium NOT DETECTED NOT DETECTED Final   Cyclospora cayetanensis NOT DETECTED NOT DETECTED Final   Entamoeba histolytica NOT DETECTED NOT DETECTED Final   Giardia lamblia NOT DETECTED NOT DETECTED Final   Adenovirus F40/41 NOT DETECTED NOT DETECTED Final   Astrovirus NOT DETECTED NOT DETECTED Final   Norovirus GI/GII NOT DETECTED NOT DETECTED Final   Rotavirus A NOT DETECTED NOT DETECTED Final   Sapovirus (I, II, IV, and V) NOT DETECTED NOT DETECTED Final  Culture, Urine     Status: Abnormal   Collection Time: 09/25/16  4:00 PM  Result Value Ref Range Status   Specimen Description URINE, CLEAN CATCH  Final   Special Requests NONE  Final   Culture (A)  Final    >=100,000 COLONIES/mL YEAST 50,000 COLONIES/mL LACTOBACILLUS SPECIES Standardized susceptibility testing for this organism is not available. [facility]    Report Status 09/27/2016 FINAL  Final  Culture, blood (Routine X 2) w Reflex to ID Panel     Status: None (Preliminary result)   Collection Time: 09/25/16  6:12 PM  Result Value Ref Range Status   Specimen Description BLOOD LEFT HAND  Final   Special Requests IN PEDIATRIC BOTTLE 1CC  Final   Culture   Final    NO GROWTH < 24 HOURS [facility]    Report Status PENDING  Incomplete  Culture, blood (Routine X 2) w Reflex to ID Panel     Status: None (Preliminary  result)   Collection Time: 09/25/16  6:12 PM  Result Value Ref Range Status   Specimen Description BLOOD RIGHT HAND  Final   Special Requests BOTTLES DRAWN AEROBIC ONLY 5CC  Final   Culture   Final    NO GROWTH < 24 HOURS Performed at [facility]    Report Status PENDING  Incomplete    Studies/Results: Dg Abd 2 Views  Result Date: 09/26/2016 CLINICAL DATA:  Initial evaluation for acute abdominal pain, diarrhea. EXAM: ABDOMEN - 2 VIEW COMPARISON:  Prior CT from 09/23/2016. FINDINGS: Paucity of gas somewhat limits evaluation of the bowels. No evidence for obstruction or ileus. No free air on lateral decubitus view. No soft tissue mass or abnormal calcification. Extensive degenerative changes seen throughout the visualized osseous structures. Visualized lungs are grossly clear. Scattered vascular calcifications noted. IMPRESSION: Nonobstructive bowel gas pattern with no radiographic evidence for acute intra-abdominal process. Electronically Signed   By: Rise Mu M.D.   On: 09/26/2016 18:20     Assessment/Plan: Leukocytosis C diff Pancreatitis ARF (baseline Cr 1.7) DM2 with blindness.   Total days of antibiotics: 11 (flagyl --> po vanco)  Her WBC is better, she is afebrile Her plain film does not show obstruction Lipase improved Her Cr is better as well Would complete 2 weeks of po vanco  Would question what her EOL plan is and if palliative care has been consulted?  Dr Luciana Axe available as needed from 10-9.          Johny Sax Infectious Diseases (pager) [phone] [email or web address] 09/27/2016, 1:22 PM  LOS: 9 days

## 2016-09-28 DIAGNOSIS — K8500 Idiopathic acute pancreatitis without necrosis or infection: Secondary | ICD-10-CM

## 2016-09-28 LAB — GLUCOSE, CAPILLARY
Glucose-Capillary: 147 mg/dL — ABNORMAL HIGH (ref 65–99)
Glucose-Capillary: 173 mg/dL — ABNORMAL HIGH (ref 65–99)

## 2016-09-28 LAB — BASIC METABOLIC PANEL
Anion gap: 10 (ref 5–15)
BUN: 48 mg/dL — AB (ref 6–20)
CHLORIDE: 103 mmol/L (ref 101–111)
CO2: 26 mmol/L (ref 22–32)
CREATININE: 1.95 mg/dL — AB (ref 0.44–1.00)
Calcium: 8.4 mg/dL — ABNORMAL LOW (ref 8.9–10.3)
GFR calc Af Amer: 27 mL/min — ABNORMAL LOW (ref >60)
GFR, EST NON AFRICAN AMERICAN: 24 mL/min — AB (ref >60)
Glucose, Bld: 152 mg/dL — ABNORMAL HIGH (ref 65–99)
POTASSIUM: 3.2 mmol/L — AB (ref 3.5–5.1)
Sodium: 139 mmol/L (ref 135–145)

## 2016-09-28 LAB — CBC
HCT: 29.3 % — ABNORMAL LOW (ref 36.0–46.0)
HEMOGLOBIN: 9.6 g/dL — AB (ref 12.0–15.0)
MCH: 31.6 pg (ref 26.0–34.0)
MCHC: 32.8 g/dL (ref 30.0–36.0)
MCV: 96.4 fL (ref 78.0–100.0)
PLATELETS: 288 10*3/uL (ref 150–400)
RBC: 3.04 MIL/uL — AB (ref 3.87–5.11)
RDW: 15.8 % — ABNORMAL HIGH (ref 11.5–15.5)
WBC: 7.5 10*3/uL (ref 4.0–10.5)

## 2016-09-28 LAB — C-REACTIVE PROTEIN: CRP: 4.6 mg/dL — AB (ref <1.0)

## 2016-09-28 MED ORDER — FLUCONAZOLE 150 MG PO TABS
150.0000 mg | ORAL_TABLET | ORAL | Status: DC
  Administered 2016-09-28 – 2016-10-01 (×2): 150 mg via ORAL
  Filled 2016-09-28 (×2): qty 1

## 2016-09-28 NOTE — Progress Notes (Signed)
Patient ID: [name], female   DOB: September 26, 1936, 80 y.o.   MRN: [id number]  PROGRESS NOTE  [name]  NFA:[id number] DOB: September 26, 1936 DOA: 09/18/2016  PCP: Georgann HousekeeperHUSAIN,KARRAR, MD  Brief Narrative:  [name] is a 80 y.o. female with past medical history significant for CAD, diabetes, hypertension, systolic congestive heart failure, CKD stage III, blindness [facility]. Creatinine was 5.98 on admission, from baseline of 1.7. She had experienced significant diarrhea in the setting of recent abx (levaquin and clindamycin) for PNA and UTI. C. diff on stool sample PTA was positive for C diff and flagyl had been started. She was admitted for IV fluids and ongoing antibiotics. She became fluid overloaded so fluids were stopped and she has undergone diuresis with increased doses of lasix from home dose with steady improvement in renal function. She developed worsening abdominal pain 10/4 and CT abdomen showed constipation and associated colonic inflammation as well as possible pancreatitis. Lipase was 350mg /dl. Pt was made NPO, IV fluids, morphine prn pain with slow resolution over the next few days. Diet was restarted 10/9.   Assessment & Plan: Acute pancreatitis: Lipase improving 250 > 102.  - Maintenance IVF - NPO > clear liquids as tolerated today - Morphine IV prn  Acute renal failure superimposed on CKD stage 3: Creatinine peak 5.98, baseline 1.7. Resolved. Cause presumed to be prerenal initially due to dehydration from diarrhea causing oliguric ARF. IV fluids produced edema, so lasix (a home med) was started with significant diuresis and improving renal clearance. - Continue lasix 20mg  po BID (home dose).  - Monitor I/O, renal function daily.  - Nephrology has signed off. - No acute findings on renal US  C. difficile colitis: Acute, associated with abx for PNA and UTI. Causing leukocytosis. GI pathogen panel neg here,  CDiff PCR report in chart from SNF is positive. Diarrhea has been resolved since admission. - Replaced flagyl with po vancomycin (finish date 10/12) due to concern for drug-induced pancreatitis - Contact precautions  Constipation: Now experiencing constipation and CT abdomen with evidence of stercoral colitis. - Soap suds enema successful 10/6 - Continue senna-ducosate daily - Nonobstructive bowel pattern on XR 10/7  Leukocytosis: Resolved. Likely related to pancreatitis - Blood cultures and urine culture drawn 10/6 - UA 10/6 showed yeast > Diflucan 150mg  po q72h x 3 - D/C'ed foley now that IV diuresis completed  FTT: per physical therapy "Pt unable to stand with +2 assist, will need mechanical lift for OOB." - Continues to require SNF placement. - Continue to work with PT as able - Recommend outpatient palliative care consultation  Chronic HFrEF due to NICM: EF 30-35% - Volume status improving with lasix, tapered to home dose - Monitor volume status on maintenance IVF  Controlled type 2 diabetes mellitus with blindness and proliferative retinopathy and diabetic nephropathy with long term insulin use: A1c on this admission 5.5, indicating good glycemic control - Check CBG qAM  Essential hypertension: Chronic. Controlled without home meds. - BP stable, will likely need to discontinue some of her home bp meds at discharge  Anemia in chronic kidney disease: Hemoglobin stable - Monitor with CBC  Hypokalemia: Due to lasix and diarrhea - Supplemented and WNL  DVT prophylaxis while in the hospital: Heparin subQ Code Status: DNR/DNI  Family Communication: Called daughter Disposition Plan: SNF pending clinical improvement  Consultants:   Nephrology, Dr. Arlean HoppingSchertz    ID, Dr. Ninetta LightsHatcher  Procedures:   None  Antimicrobials:   Flagyl 09/18/2016 >> 09/24/2016  PO Vancomycin 10/6 >> 10/12 to complete 14 day tx  Fluconazole on 9/29, 10/04, and 10/7  Subjective: Patient reports  improved pain, feels better overall. Willing to attempt start clear liquids today.  Objective: Vitals:   09/27/16 1500 09/27/16 2200 09/28/16 0646 09/28/16 1333  BP: (!) 122/39 (!) 116/46 (!) 131/55 (!) 134/40  Pulse: 79 73 81 68  Resp: 18 18 17 18   Temp: 98.4 F (36.9 C) 97.8 F (36.6 C) 97.9 F (36.6 C) 99.5 F (37.5 C)  TempSrc: Oral Oral Oral Axillary  SpO2: 99% 97% 98% 97%  Weight:   101.6 kg (223 lb 15.8 oz)   Height:   5\' 4"  (1.626 m)     Intake/Output Summary (Last 24 hours) at 09/28/16 1335 Last data filed at 09/28/16 0659  Gross per 24 hour  Intake          1646.46 ml  Output              400 ml  Net          1246.46 ml   Filed Weights   09/26/16 0546 09/27/16 0521 09/28/16 0646  Weight: 98.6 kg (217 lb 6 oz) 99.3 kg (218 lb 14.7 oz) 101.6 kg (223 lb 15.8 oz)    Examination: General exam: Elderly female resting quietly on her back in no distress. Does not open eyes for conversation but responds appropriately, follows commands and fully oriented. Respiratory system: decreased respiratory effort with globally diminished breath sounds. No crackles or wheezes. Cardiovascular system: S1 & S2 heard, RRR. No JVD Gastrointestinal system: (+) BS, soft, obese, tenderness resolved, nondistended Central nervous system: Blind, no focal sensorimotor deficits noted. No aphasia.  Extremities: 1+ bilateral UE swelling noted, palpable pulses. Trace BL LE edema Skin: no lesions or ulcers noted Psychiatry: Normal mood and behavior    Data Reviewed: I have personally reviewed following labs and imaging studies  CBC:  Recent Labs Lab 09/24/16 0541 09/25/16 0500 09/26/16 0628 09/27/16 0618 09/28/16 0457  WBC 11.7* 14.0* 20.2* 13.1* 7.5  HGB 9.7* 10.2* 10.7* 8.9* 9.6*  HCT 29.1* 30.5* 32.4* 26.9* 29.3*  MCV 94.5 95.0 95.0 96.4 96.4  PLT 299 320 308 285 288   Basic Metabolic Panel:  Recent Labs Lab 09/24/16 0541 09/25/16 0500 09/26/16 0628 09/27/16 0618  09/28/16 0457  NA 139 140 139 137 139  K 3.8 3.6 3.6 3.3* 3.2*  CL 106 105 105 104 103  CO2 25 26 23 24 26   GLUCOSE 111* 104* 95 147* 152*  BUN 57* 55* 55* 55* 48*  CREATININE 2.67* 2.43* 2.55* 2.31* 1.95*  CALCIUM 8.5* 8.6* 8.5* 8.1* 8.4*   GFR: Estimated Creatinine Clearance: 28 mL/min (by C-G formula based on SCr of 1.95 mg/dL (H)). Liver Function Tests:  Recent Labs Lab 09/24/16 0541 09/25/16 0500 09/27/16 0618  AST 19 15 16   ALT 14 12* 10*  ALKPHOS 65 68 55  BILITOT 0.6 0.7 0.6  PROT 5.7* 5.9* 5.3*  ALBUMIN 2.5* 2.6* 2.4*    Recent Labs Lab 09/24/16 0541 09/26/16 1812  LIPASE 350* 102*   No results for input(s): AMMONIA in the last 168 hours. Coagulation Profile: No results for input(s): INR, PROTIME in the last 168 hours. Cardiac Enzymes: No results for input(s): CKTOTAL, CKMB, CKMBINDEX, TROPONINI in the last 168 hours. BNP (last 3 results) No results for input(s): PROBNP in the last 8760 hours. HbA1C: No results for input(s): HGBA1C in the last  72 hours. CBG:  Recent Labs Lab 09/25/16 0740 09/25/16 1236 09/26/16 0751 09/27/16 0724 09/28/16 0734  GLUCAP 122* 107* 111* 154* 147*   Lipid Profile: No results for input(s): CHOL, HDL, LDLCALC, TRIG, CHOLHDL, LDLDIRECT in the last 72 hours. Thyroid Function Tests: No results for input(s): TSH, T4TOTAL, FREET4, T3FREE, THYROIDAB in the last 72 hours. Anemia Panel: No results for input(s): VITAMINB12, FOLATE, FERRITIN, TIBC, IRON, RETICCTPCT in the last 72 hours. Urine analysis:    Component Value Date/Time   COLORURINE YELLOW 09/25/2016 1600   APPEARANCEUR CLOUDY (A) 09/25/2016 1600   LABSPEC 1.014 09/25/2016 1600   PHURINE 5.0 09/25/2016 1600   GLUCOSEU NEGATIVE 09/25/2016 1600   GLUCOSEU NEG mg/dL 09/32/6712 4580   HGBUR NEGATIVE 09/25/2016 1600   HGBUR negative 07/02/2009 1003   BILIRUBINUR NEGATIVE 09/25/2016 1600   KETONESUR NEGATIVE 09/25/2016 1600   PROTEINUR NEGATIVE 09/25/2016 1600    UROBILINOGEN 0.2 03/05/2015 1854   NITRITE NEGATIVE 09/25/2016 1600   LEUKOCYTESUR MODERATE (A) 09/25/2016 1600   Sepsis Labs: @LABRCNTIP (procalcitonin:4,lacticidven:4)   Recent Results (from the past 240 hour(s))  MRSA PCR Screening     Status: Abnormal   Collection Time: 09/19/16  9:51 AM  Result Value Ref Range Status   MRSA by PCR POSITIVE (A) NEGATIVE Final      Radiology Studies: Dg Chest 2 View Result Date: 09/18/2016 No active cardiopulmonary disease. No evidence of pneumonia. Stable cardiomegaly.  US Renal Result Date: 09/18/2016 Exam is somewhat limited due to body habitus. No definite renal abnormality is noted.   Nm Pulmonary Perf And Vent Result Date: 09/18/2016 No evidence of pulmonary embolism.   Scheduled Meds: . allopurinol  100 mg Oral Q breakfast  . brimonidine  1 drop Left Eye QPC breakfast  . fluconazole  150 mg Oral Q72H  . furosemide  20 mg Oral BID  . heparin  5,000 Units Subcutaneous Q12H  . potassium chloride  30 mEq Oral Once  . QUEtiapine  25 mg Oral BID  . senna-docusate  1 tablet Oral QHS  . sodium chloride flush  3 mL Intravenous Q12H  . vancomycin  125 mg Oral QID   Continuous Infusions: . dextrose 5 % and 0.45% NaCl 75 mL/hr at 09/28/16 0507    LOS: 10 days   Time spent: 25 minutes. Greater than 50% of the time spent on counseling and coordinating the care.  Hazeline Junker, MD [facility] Pager [phone]  If 7PM-7AM, please contact night-coverage [email or web address] Password [email or web address] 09/28/2016, 1:35 PM

## 2016-09-28 NOTE — Progress Notes (Signed)
Physical Therapy Treatment Patient Details Name: [name] MRN: [id number] DOB: June 13, 1936 Today's Date: 09/28/2016    History of Present Illness 80 y.o. female admitted with c diff, ARF. Pt is from SNF. PMH of DM, blindness, CHF, CKD, recent UTI and PNA.     PT Comments    Pt progressing, incr tolerance to activity today; possibly try bariatric  Steady next visit with if +2 assist available  Follow Up Recommendations  SNF;Supervision/Assistance - 24 hour     Equipment Recommendations  None recommended by PT    Recommendations for Other Services       Precautions / Restrictions Precautions Precautions: Fall Precaution Comments: non-ambulatory at baseline Restrictions Weight Bearing Restrictions: No    Mobility  Bed Mobility Overal bed mobility: Needs Assistance;+2 for physical assistance Bed Mobility: Sit to Supine     Supine to sit: Max assist;HOB elevated Sit to supine: +2 for physical assistance;Mod assist   General bed mobility comments: assist to elevate trunk and lower into supine; pt requires hadn over hand at times to assist self partially d/t her low vision/blindness; +2 assist to scoot up in supine position  Transfers                    Ambulation/Gait                 Stairs            Wheelchair Mobility    Modified Rankin (Stroke Patients Only)       Balance Overall balance assessment: Needs assistance Sitting-balance support: Feet supported;Single extremity supported;No upper extremity supported Sitting balance-Leahy Scale: Fair                              Cognition Arousal/Alertness: Awake/alert Behavior During Therapy: WFL for tasks assessed/performed Overall Cognitive Status: Within Functional Limits for tasks assessed                      Exercises General Exercises - Upper Extremity Shoulder Flexion: AAROM;AROM;Both;10 reps Shoulder Horizontal ADduction: AROM;AAROM;Both;10  reps General Exercises - Lower Extremity Ankle Circles/Pumps: AROM;Both;20 reps;Supine Long Arc Quad: AROM;Strengthening;Both;10 reps;Seated Heel Slides: AAROM;Both;10 reps;Supine Hip ABduction/ADduction: AAROM;Both;10 reps;Supine (bil isometric ADDuction x 10) Straight Leg Raises: AROM;AAROM;Strengthening;Both;10 reps    General Comments        Pertinent Vitals/Pain Pain Assessment: No/denies pain    Home Living                      Prior Function            PT Goals (current goals can now be found in the care plan section) Acute Rehab PT Goals Patient Stated Goal: go to church and Bible study at SNF, attend the [employer] PT Goal Formulation: With patient Time For Goal Achievement: 10/05/16 Potential to Achieve Goals: Fair Progress towards PT goals: Progressing toward goals    Frequency    Min 2X/week      PT Plan Current plan remains appropriate    Co-evaluation             End of Session   Activity Tolerance: Patient tolerated treatment well Patient left: in bed;with call bell/phone within reach;with bed alarm set     Time: [id number] PT Time Calculation (min) (ACUTE ONLY): 30 min  Charges:  $Therapeutic Exercise: 8-22 mins $Therapeutic Activity: 8-22 mins  G CodesDrucilla Chalet 09/28/2016, 2:31 PM

## 2016-09-29 LAB — GLUCOSE, CAPILLARY: Glucose-Capillary: 123 mg/dL — ABNORMAL HIGH (ref 65–99)

## 2016-09-29 NOTE — Progress Notes (Signed)
Patient ID: [name], female   DOB: 1936/04/28, 80 y.o.   MRN: [id number]  PROGRESS NOTE  [name]  [id number] DOB: 1936/04/28 DOA: 09/18/2016  PCP: Georgann HousekeeperHUSAIN,KARRAR, MD  Brief Narrative:  [name] Clicheearlie H Mclennan is a 80 y.o. female with past medical history significant for CAD, diabetes, hypertension, systolic congestive heart failure, CKD stage III, blindness who [facility] for elevated creatinine. Creatinine was 5.98 on admission, from baseline of 1.7. She had experienced significant diarrhea in the setting of recent abx (levaquin and clindamycin) for PNA and UTI. C. diff on stool sample PTA was positive for C diff and flagyl had been started. She was admitted for IV fluids and ongoing antibiotics. She became fluid overloaded so fluids were stopped and she has undergone diuresis with increased doses of lasix from home dose with steady improvement in renal function. She developed worsening abdominal pain 10/4 and CT abdomen showed constipation and associated colonic inflammation as well as possible pancreatitis. Lipase was 350mg /dl. Pt was made NPO, maintenance IV fluids given and morphine prn pain with slow resolution over the next few days. Diet was restarted 10/9 which has been fairly well tolerated. Diet will be advanced to bland 10/10.   Assessment & Plan: Acute pancreatitis: Lipase improving 350 > 102. CRP 7.8 > 4.6.  - Maintenance IVF - clear liquids > full liquids yesterday >> bland diet as tolerated today - Morphine IV prn  Acute renal failure superimposed on CKD stage 3: Now returned to baseline. Creatinine peak 5.98, baseline 1.7. No acute findings on renal US early in admission. Cause presumed to be prerenal initially due to dehydration from diarrhea causing oliguric ARF. IV fluids produced edema, so lasix (a home med) was started with significant diuresis and improving renal clearance. - Continue lasix 20mg  po BID (home dose).  - Monitor  I/O, renal function daily.  - Nephrology signed off 10/4  C. difficile colitis: Acute, associated with abx for PNA and UTI. Causing leukocytosis. GI pathogen panel neg here, CDiff PCR report in chart from SNF is positive. Diarrhea has been resolved since admission. - Replaced flagyl with po vancomycin (finish date 10/12) due to concern for drug-induced pancreatitis.  - Contact precautions  Constipation: CT abdomen with evidence of stercoral colitis. Since resolved. Has been NPO, so expect decreased output. - Soap suds enema successful 10/6. Nonobstructive bowel pattern on XR 10/7 - Continue senna-ducosate daily  Leukocytosis: Resolved. Likely related to pancreatitis - Blood cultures and urine culture drawn 10/6 - UA 10/6 showed yeast > Diflucan 150mg  po q72h x 3 - D/C'ed foley now that IV diuresis completed  FTT: per physical therapy "Pt unable to stand with +2 assist, will need mechanical lift for OOB." - Continues to require SNF placement. - Continue to work with PT as able - Recommend outpatient palliative care consultation  Chronic HFrEF due to NICM: EF 30-35% - Volume status improving with lasix, tapered to home dose - Monitor volume status on maintenance IVF  Controlled type 2 diabetes mellitus with blindness and proliferative retinopathy and diabetic nephropathy with long term insulin use: A1c on this admission 5.5, indicating good glycemic control - Check CBG qAM  Essential hypertension: Chronic. Controlled without home meds. - BP stable, will likely need to discontinue some of her home bp meds at discharge  Anemia in chronic kidney disease: Hemoglobin stable - Monitor with CBC  Hypokalemia: Due to lasix and diarrhea - Supplemented and WNL  DVT prophylaxis while in the hospital:  Heparin subQ Code Status: DNR/DNI  Family Communication: Discussed by phone with daughter, [name] Disposition Plan: SNF pending clinical improvement  Consultants:   Nephrology, Dr. Arlean Hopping      ID, Dr. Ninetta Lights  Procedures:   None   Antimicrobials:   Flagyl 09/18/2016 >> 09/24/2016  PO Vancomycin 10/6 >> 10/12 to complete 14 day tx  Fluconazole on 9/29, 10/04, and 10/7  Subjective: Patient reports improved pain, feels better overall. Per tech and pt, took clear liquids well yesterday AM and advanced to full liquids for dinner which produced nausea, zofran given. She denies any abdominal pain at all.  Objective: Vitals:   09/28/16 1333 09/28/16 2116 09/29/16 0448 09/29/16 0622  BP: (!) 134/40 (!) 129/54 (!) 125/59   Pulse: 68 83 83   Resp: 18 18 18    Temp: 99.5 F (37.5 C) 98.4 F (36.9 C) 98.1 F (36.7 C)   TempSrc: Axillary Oral Oral   SpO2: 97% 100% 96%   Weight:    99.3 kg (218 lb 14.7 oz)  Height:        Intake/Output Summary (Last 24 hours) at 09/29/16 1317 Last data filed at 09/29/16 0600  Gross per 24 hour  Intake          2106.25 ml  Output             1250 ml  Net           856.25 ml   Filed Weights   09/27/16 0521 09/28/16 0646 09/29/16 0622  Weight: 99.3 kg (218 lb 14.7 oz) 101.6 kg (223 lb 15.8 oz) 99.3 kg (218 lb 14.7 oz)    Examination: General exam: Elderly female resting quietly on her back in no distress. Does not open eyes for conversation but responds appropriately, follows commands and fully oriented. Respiratory system: decreased respiratory effort with globally diminished breath sounds. No crackles or wheezes. Cardiovascular system: S1 & S2 heard, RRR. No JVD Gastrointestinal system: (+) BS, soft, obese, nontender, nondistended Central nervous system: Blind, no focal sensorimotor deficits noted. No aphasia.  Extremities: 1+ bilateral UE swelling noted, palpable pulses. Trace BL LE edema Skin: no lesions or ulcers noted Psychiatry: Normal mood and behavior    Data Reviewed: I have personally reviewed following labs and imaging studies  CBC:  Recent Labs Lab 09/24/16 0541 09/25/16 0500 09/26/16 0628 09/27/16 0618  09/28/16 0457  WBC 11.7* 14.0* 20.2* 13.1* 7.5  HGB 9.7* 10.2* 10.7* 8.9* 9.6*  HCT 29.1* 30.5* 32.4* 26.9* 29.3*  MCV 94.5 95.0 95.0 96.4 96.4  PLT 299 320 308 285 288   Basic Metabolic Panel:  Recent Labs Lab 09/24/16 0541 09/25/16 0500 09/26/16 0628 09/27/16 0618 09/28/16 0457  NA 139 140 139 137 139  K 3.8 3.6 3.6 3.3* 3.2*  CL 106 105 105 104 103  CO2 25 26 23 24 26   GLUCOSE 111* 104* 95 147* 152*  BUN 57* 55* 55* 55* 48*  CREATININE 2.67* 2.43* 2.55* 2.31* 1.95*  CALCIUM 8.5* 8.6* 8.5* 8.1* 8.4*   GFR: Estimated Creatinine Clearance: 27.7 mL/min (by C-G formula based on SCr of 1.95 mg/dL (H)). Liver Function Tests:  Recent Labs Lab 09/24/16 0541 09/25/16 0500 09/27/16 0618  AST 19 15 16   ALT 14 12* 10*  ALKPHOS 65 68 55  BILITOT 0.6 0.7 0.6  PROT 5.7* 5.9* 5.3*  ALBUMIN 2.5* 2.6* 2.4*    Recent Labs Lab 09/24/16 0541 09/26/16 1812  LIPASE 350* 102*   No results for input(s): AMMONIA in  the last 168 hours. Coagulation Profile: No results for input(s): INR, PROTIME in the last 168 hours. Cardiac Enzymes: No results for input(s): CKTOTAL, CKMB, CKMBINDEX, TROPONINI in the last 168 hours. BNP (last 3 results) No results for input(s): PROBNP in the last 8760 hours. HbA1C: No results for input(s): HGBA1C in the last 72 hours. CBG:  Recent Labs Lab 09/26/16 0751 09/27/16 0724 09/28/16 0734 09/28/16 2118 09/29/16 0742  GLUCAP 111* 154* 147* 173* 123*   Lipid Profile: No results for input(s): CHOL, HDL, LDLCALC, TRIG, CHOLHDL, LDLDIRECT in the last 72 hours. Thyroid Function Tests: No results for input(s): TSH, T4TOTAL, FREET4, T3FREE, THYROIDAB in the last 72 hours. Anemia Panel: No results for input(s): VITAMINB12, FOLATE, FERRITIN, TIBC, IRON, RETICCTPCT in the last 72 hours. Urine analysis:    Component Value Date/Time   COLORURINE YELLOW 09/25/2016 1600   APPEARANCEUR CLOUDY (A) 09/25/2016 1600   LABSPEC 1.014 09/25/2016 1600    PHURINE 5.0 09/25/2016 1600   GLUCOSEU NEGATIVE 09/25/2016 1600   GLUCOSEU NEG mg/dL 57/84/6962 9528   HGBUR NEGATIVE 09/25/2016 1600   HGBUR negative 07/02/2009 1003   BILIRUBINUR NEGATIVE 09/25/2016 1600   KETONESUR NEGATIVE 09/25/2016 1600   PROTEINUR NEGATIVE 09/25/2016 1600   UROBILINOGEN 0.2 03/05/2015 1854   NITRITE NEGATIVE 09/25/2016 1600   LEUKOCYTESUR MODERATE (A) 09/25/2016 1600   Sepsis Labs: @LABRCNTIP (procalcitonin:4,lacticidven:4)   Recent Results (from the past 240 hour(s))  MRSA PCR Screening     Status: Abnormal   Collection Time: 09/19/16  9:51 AM  Result Value Ref Range Status   MRSA by PCR POSITIVE (A) NEGATIVE Final      Radiology Studies: Dg Chest 2 View Result Date: 09/18/2016 No active cardiopulmonary disease. No evidence of pneumonia. Stable cardiomegaly.  US Renal Result Date: 09/18/2016 Exam is somewhat limited due to body habitus. No definite renal abnormality is noted.   Nm Pulmonary Perf And Vent Result Date: 09/18/2016 No evidence of pulmonary embolism.   Scheduled Meds: . allopurinol  100 mg Oral Q breakfast  . brimonidine  1 drop Left Eye QPC breakfast  . fluconazole  150 mg Oral Q72H  . furosemide  20 mg Oral BID  . heparin  5,000 Units Subcutaneous Q12H  . potassium chloride  30 mEq Oral Once  . QUEtiapine  25 mg Oral BID  . senna-docusate  1 tablet Oral QHS  . sodium chloride flush  3 mL Intravenous Q12H  . vancomycin  125 mg Oral QID   Continuous Infusions: . dextrose 5 % and 0.45% NaCl 75 mL/hr at 09/28/16 0507    LOS: 11 days   Time spent: 25 minutes. Greater than 50% of the time spent on counseling and coordinating the care.  Hazeline Junker, MD [facility] Pager [phone]  If 7PM-7AM, please contact night-coverage [email or web address] Password [email or web address] 09/29/2016, 1:17 PM

## 2016-09-29 NOTE — Progress Notes (Signed)
LCSWA notified Maple Lucas Mallow about patient continued medical work up.  Will continue to follow and assist with patient disposition once medically stable.

## 2016-09-29 NOTE — Care Management Important Message (Signed)
Important Message  Patient Details IM Letter given to Rhonda/Case Manager to present to Patient Name: [name] MRN: [id number] Date of Birth: 02/15/1936   Medicare Important Message Given:  Yes    Haskell Flirt 09/29/2016, 10:14 AMImportant Message  Patient Details  Name: [name] MRN: [id number] Date of Birth: 10/24/1936   Medicare Important Message Given:  Yes    Haskell Flirt 09/29/2016, 10:14 AM

## 2016-09-30 DIAGNOSIS — N183 Chronic kidney disease, stage 3 (moderate): Secondary | ICD-10-CM

## 2016-09-30 DIAGNOSIS — I25119 Atherosclerotic heart disease of native coronary artery with unspecified angina pectoris: Secondary | ICD-10-CM

## 2016-09-30 DIAGNOSIS — N179 Acute kidney failure, unspecified: Principal | ICD-10-CM

## 2016-09-30 DIAGNOSIS — N189 Chronic kidney disease, unspecified: Secondary | ICD-10-CM

## 2016-09-30 LAB — CULTURE, BLOOD (ROUTINE X 2)
CULTURE: NO GROWTH
Culture: NO GROWTH

## 2016-09-30 LAB — CBC
HCT: 29 % — ABNORMAL LOW (ref 36.0–46.0)
Hemoglobin: 9.4 g/dL — ABNORMAL LOW (ref 12.0–15.0)
MCH: 31.9 pg (ref 26.0–34.0)
MCHC: 32.4 g/dL (ref 30.0–36.0)
MCV: 98.3 fL (ref 78.0–100.0)
Platelets: 305 10*3/uL (ref 150–400)
RBC: 2.95 MIL/uL — ABNORMAL LOW (ref 3.87–5.11)
RDW: 15.9 % — ABNORMAL HIGH (ref 11.5–15.5)
WBC: 4.6 10*3/uL (ref 4.0–10.5)

## 2016-09-30 LAB — BASIC METABOLIC PANEL
ANION GAP: 6 (ref 5–15)
BUN: 34 mg/dL — ABNORMAL HIGH (ref 6–20)
CHLORIDE: 107 mmol/L (ref 101–111)
CO2: 26 mmol/L (ref 22–32)
Calcium: 8.5 mg/dL — ABNORMAL LOW (ref 8.9–10.3)
Creatinine, Ser: 1.57 mg/dL — ABNORMAL HIGH (ref 0.44–1.00)
GFR calc non Af Amer: 31 mL/min — ABNORMAL LOW (ref >60)
GFR, EST AFRICAN AMERICAN: 36 mL/min — AB (ref >60)
Glucose, Bld: 157 mg/dL — ABNORMAL HIGH (ref 65–99)
POTASSIUM: 3.3 mmol/L — AB (ref 3.5–5.1)
SODIUM: 139 mmol/L (ref 135–145)

## 2016-09-30 LAB — GLUCOSE, CAPILLARY: GLUCOSE-CAPILLARY: 137 mg/dL — AB (ref 65–99)

## 2016-09-30 LAB — C-REACTIVE PROTEIN: CRP: 2.5 mg/dL — AB (ref <1.0)

## 2016-09-30 MED ORDER — MORPHINE SULFATE (PF) 2 MG/ML IV SOLN: 2.0000 mg | Freq: Four times a day (QID) | INTRAVENOUS | Status: DC | PRN

## 2016-09-30 NOTE — Progress Notes (Signed)
Patient ID: ADALIZ DOBIS, female   DOB: Nov 25, 1936, 80 y.o.   MRN: [id number]  PROGRESS NOTE  TEYANNA THIELMAN  WUJ:[id number] DOB: Apr 22, 1936 DOA: 09/18/2016  PCP: Georgann Housekeeper, MD  Brief Narrative:  80 y.o. ? [place] resident since 2013 CAD heart cath 2007-medical management,  Diabetes-diabetic retinopathy resulting in bilateral visual losses/blindness hypertension,  Body mass index is 37.05 kg/m. NICM EF 30-35% systolic congestive heart failure-->Rpt 50-55% 01/2012, CKD stage III with anemia renal disease, HLD  OSA previously seen 2013 by Dr. Shelle Iron   presented from [facility] assisted living for elevated creatinine.  Creatinine was 5.98 on admission, from baseline of 1.7.  She had experienced significant diarrhea in the setting of recent abx (levaquin and clindamycin) for PNA and UTI. C. diff on stool sample PTA was positive for C diff and flagyl had been started.  She was admitted for IV fluids and ongoing antibiotics.  became fluid overloaded- has underwent diuresis with increased doses of lasix from home dose with steady improvement in renal function.  She developed worsening abdominal pain 10/4 and CT abdomen showed constipation and associated colonic inflammation as well as possible pancreatitis. Lipase was 350mg /dl.  Pt was made NPO, maintenance IV fluids given and morphine prn pain with slow resolution over the next few days.  Diet  restarted 10/9 advanced to bland 10/10.   Assessment & Plan: Acute pancreatitis: Lipase improving 350 > 102. CRP 7.8 > 4.6.  - Maintenance IVF d/c on 09/30/16 - clear liquids > full liquids yesterday >> bland diet as tolerated today.  Patient encrouaged to increase diet - Morphine IV prn changed to q6prn 10/11  Acute renal failure superimposed on CKD stage 3:  returned to baseline.  Creatinine peak 5.98, baseline 1.7.  No acute findings on renal US early in admission.  Cause presumed to be prerenal initially due to dehydration  from diarrhea causing oliguric ARF. I V fluids produced edema, so lasix (a home med) was started with significant diuresis and improving renal clearance. - Continue lasix 20mg  po BID (home dose).  - Monitor I/O, renal function daily, still net + 5 liters, swelling noted in hands - Nephrology signed off 10/4  C. difficile colitis:  Acute, associated with abx for PNA and UTI CDiff PCR report in chart from SNF is positive. Diarrhea has been resolved since admission. - Replaced flagyl with po vancomycin (finish date 10/12) due to concern for drug-induced pancreatitis.  - Contact precautions  Constipation:  CT abdomen with evidence of stercoral colitis. Since resolved.  Has been NPO, so expect decreased output. - Soap suds enema successful 10/6. Nonobstructive bowel pattern on XR 10/7 - Continue senna-ducosate daily  Leukocytosis: Resolved. Likely related to pancreatitis - Blood cultures and urine culture drawn 10/6 - UA 10/6 showed yeast > Diflucan 150mg  po q72h x 3 - foley in situ and will bladder train 10/11  FTT: per physical therapy "Pt unable to stand with +2 assist, will need mechanical lift for OOB." - Continues to require SNF placement. - Continue to work with PT as able - Recommend outpatient palliative care consultation  Chronic HFrEF due to NICM: EF 30-35% - Volume status improving with lasix, tapered to home dose  - Monitor volume status on maintenance IVF  Controlled type 2 diabetes mellitus with blindness and proliferative retinopathy and diabetic nephropathy with long term insulin use: A1c on this admission 5.5, indicating good glycemic control - Check CBG qAM  Essential hypertension: Chronic. Controlled without home meds. - BP  stable, will likely need to discontinue some of her home bp meds at discharge  Anemia in chronic kidney disease: Hemoglobin stable - Monitor with CBC  Hypokalemia: Due to lasix and diarrhea - Supplemented and WNL  DVT prophylaxis while in  the hospital: Heparin subQ Code Status: DNR/DNI  Family Communication: no family + this am.  called and left message with daughter Disposition Plan: SNF pending clinical improvement  Consultants:   Nephrology, Dr. Arlean Hopping    ID, Dr. Ninetta Lights  Procedures:   None   Antimicrobials:   Flagyl 09/18/2016 >> 09/24/2016  PO Vancomycin 10/6 >> 10/12 to complete 14 day tx  Fluconazole on 9/29, 10/04, and 10/7  Subjective:  Overall well Nursing reports poor appetite No cp No n/v Passing good urine now   Objective: Vitals:   09/29/16 1450 09/29/16 2135 09/30/16 0532 09/30/16 0536  BP: (!) 119/50 (!) 114/46  (!) 126/44  Pulse: 69 81  78  Resp: 18 20  20   Temp: 98.8 F (37.1 C) 98.1 F (36.7 C)  97.4 F (36.3 C)  TempSrc: Axillary Oral  Oral  SpO2: 99% 100%  97%  Weight:   97.9 kg (215 lb 13.3 oz)   Height:        Intake/Output Summary (Last 24 hours) at 09/30/16 0823 Last data filed at 09/30/16 0708  Gross per 24 hour  Intake             2015 ml  Output             1300 ml  Net              715 ml   Filed Weights   09/28/16 0646 09/29/16 0622 09/30/16 0532  Weight: 101.6 kg (223 lb 15.8 oz) 99.3 kg (218 lb 14.7 oz) 97.9 kg (215 lb 13.3 oz)    Examination: General exam: Elderly female resting quietly on her back in no distress. Does not open eyes for conversation but responds appropriately, follows commands and fully oriented. Respiratory system: decreased respiratory effort with globally diminished breath sounds. No crackles or wheezes. Cardiovascular system: S1 & S2 heard, RRR. No JVD Gastrointestinal system: (+) BS, soft, obese, nontender, nondistended Central nervous system: Blind, no focal sensorimotor deficits noted. No aphasia.  Extremities: 1+ bilateral UE swelling noted, palpable pulses. Trace BL LE edema Skin: no lesions or ulcers noted Psychiatry: Normal mood and behavior    Data Reviewed: I have personally reviewed following labs and imaging  studies  CBC:  Recent Labs Lab 09/25/16 0500 09/26/16 0628 09/27/16 0618 09/28/16 0457 09/30/16 0549  WBC 14.0* 20.2* 13.1* 7.5 4.6  HGB 10.2* 10.7* 8.9* 9.6* 9.4*  HCT 30.5* 32.4* 26.9* 29.3* 29.0*  MCV 95.0 95.0 96.4 96.4 98.3  PLT 320 308 285 288 305   Basic Metabolic Panel:  Recent Labs Lab 09/25/16 0500 09/26/16 0628 09/27/16 0618 09/28/16 0457 09/30/16 0549  NA 140 139 137 139 139  K 3.6 3.6 3.3* 3.2* 3.3*  CL 105 105 104 103 107  CO2 26 23 24 26 26   GLUCOSE 104* 95 147* 152* 157*  BUN 55* 55* 55* 48* 34*  CREATININE 2.43* 2.55* 2.31* 1.95* 1.57*  CALCIUM 8.6* 8.5* 8.1* 8.4* 8.5*   GFR: Estimated Creatinine Clearance: 34.1 mL/min (by C-G formula based on SCr of 1.57 mg/dL (H)). Liver Function Tests:  Recent Labs Lab 09/24/16 0541 09/25/16 0500 09/27/16 0618  AST 19 15 16   ALT 14 12* 10*  ALKPHOS 65 68 55  BILITOT  0.6 0.7 0.6  PROT 5.7* 5.9* 5.3*  ALBUMIN 2.5* 2.6* 2.4*    Recent Labs Lab 09/24/16 0541 09/26/16 1812  LIPASE 350* 102*   No results for input(s): AMMONIA in the last 168 hours. Coagulation Profile: No results for input(s): INR, PROTIME in the last 168 hours. Cardiac Enzymes: No results for input(s): CKTOTAL, CKMB, CKMBINDEX, TROPONINI in the last 168 hours. BNP (last 3 results) No results for input(s): PROBNP in the last 8760 hours. HbA1C: No results for input(s): HGBA1C in the last 72 hours. CBG:  Recent Labs Lab 09/27/16 0724 09/28/16 0734 09/28/16 2118 09/29/16 0742 09/30/16 0731  GLUCAP 154* 147* 173* 123* 137*   Lipid Profile: No results for input(s): CHOL, HDL, LDLCALC, TRIG, CHOLHDL, LDLDIRECT in the last 72 hours. Thyroid Function Tests: No results for input(s): TSH, T4TOTAL, FREET4, T3FREE, THYROIDAB in the last 72 hours. Anemia Panel: No results for input(s): VITAMINB12, FOLATE, FERRITIN, TIBC, IRON, RETICCTPCT in the last 72 hours. Urine analysis:    Component Value Date/Time   COLORURINE YELLOW  09/25/2016 1600   APPEARANCEUR CLOUDY (A) 09/25/2016 1600   LABSPEC 1.014 09/25/2016 1600   PHURINE 5.0 09/25/2016 1600   GLUCOSEU NEGATIVE 09/25/2016 1600   GLUCOSEU NEG mg/dL 16/10/960402/18/2009 [id number]   HGBUR NEGATIVE 09/25/2016 1600   HGBUR negative 07/02/2009 1003   BILIRUBINUR NEGATIVE 09/25/2016 1600   KETONESUR NEGATIVE 09/25/2016 1600   PROTEINUR NEGATIVE 09/25/2016 1600   UROBILINOGEN 0.2 03/05/2015 1854   NITRITE NEGATIVE 09/25/2016 1600   LEUKOCYTESUR MODERATE (A) 09/25/2016 1600   Sepsis Labs: @LABRCNTIP (procalcitonin:4,lacticidven:4)   Recent Results (from the past 240 hour(s))  MRSA PCR Screening     Status: Abnormal   Collection Time: 09/19/16  9:51 AM  Result Value Ref Range Status   MRSA by PCR POSITIVE (A) NEGATIVE Final      Radiology Studies: Dg Chest 2 View Result Date: 09/18/2016 No active cardiopulmonary disease. No evidence of pneumonia. Stable cardiomegaly.  [place] Renal Result Date: 09/18/2016 Exam is somewhat limited due to body habitus. No definite renal abnormality is noted.   Nm Pulmonary Perf And Vent Result Date: 09/18/2016 No evidence of pulmonary embolism.   Scheduled Meds: . allopurinol  100 mg Oral Q breakfast  . brimonidine  1 drop Left Eye QPC breakfast  . fluconazole  150 mg Oral Q72H  . furosemide  20 mg Oral BID  . heparin  5,000 Units Subcutaneous Q12H  . potassium chloride  30 mEq Oral Once  . QUEtiapine  25 mg Oral BID  . senna-docusate  1 tablet Oral QHS  . sodium chloride flush  3 mL Intravenous Q12H  . vancomycin  125 mg Oral QID   Continuous Infusions: . dextrose 5 % and 0.45% NaCl 75 mL/hr at 09/30/16 0306    LOS: 12 days   Time spent: 25 minutes. Greater than 50% of the time spent on counseling and coordinating the care.  Pleas KochJai Alazar Cherian, MD [facility]([phone]) [phone]   If 7PM-7AM, please contact night-coverage [email or web address] Password [email or web address] 09/30/2016, 8:23 AM

## 2016-10-01 LAB — CBC WITH DIFFERENTIAL/PLATELET
Basophils Absolute: 0 10*3/uL (ref 0.0–0.1)
Basophils Relative: 1 %
EOS ABS: 0.2 10*3/uL (ref 0.0–0.7)
EOS PCT: 6 %
HCT: 28.4 % — ABNORMAL LOW (ref 36.0–46.0)
Hemoglobin: 9.2 g/dL — ABNORMAL LOW (ref 12.0–15.0)
LYMPHS ABS: 2.2 10*3/uL (ref 0.7–4.0)
Lymphocytes Relative: 49 %
MCH: 31.8 pg (ref 26.0–34.0)
MCHC: 32.4 g/dL (ref 30.0–36.0)
MCV: 98.3 fL (ref 78.0–100.0)
MONO ABS: 0.5 10*3/uL (ref 0.1–1.0)
MONOS PCT: 11 %
Neutro Abs: 1.5 10*3/uL — ABNORMAL LOW (ref 1.7–7.7)
Neutrophils Relative %: 33 %
PLATELETS: 296 10*3/uL (ref 150–400)
RBC: 2.89 MIL/uL — ABNORMAL LOW (ref 3.87–5.11)
RDW: 16 % — AB (ref 11.5–15.5)
WBC: 4.4 10*3/uL (ref 4.0–10.5)

## 2016-10-01 LAB — BASIC METABOLIC PANEL
ANION GAP: 8 (ref 5–15)
BUN: 27 mg/dL — ABNORMAL HIGH (ref 6–20)
CALCIUM: 8.7 mg/dL — AB (ref 8.9–10.3)
CO2: 27 mmol/L (ref 22–32)
Chloride: 107 mmol/L (ref 101–111)
Creatinine, Ser: 1.39 mg/dL — ABNORMAL HIGH (ref 0.44–1.00)
GFR, EST AFRICAN AMERICAN: 41 mL/min — AB (ref >60)
GFR, EST NON AFRICAN AMERICAN: 36 mL/min — AB (ref >60)
GLUCOSE: 102 mg/dL — AB (ref 65–99)
POTASSIUM: 3.4 mmol/L — AB (ref 3.5–5.1)
Sodium: 142 mmol/L (ref 135–145)

## 2016-10-01 LAB — GLUCOSE, CAPILLARY: GLUCOSE-CAPILLARY: 96 mg/dL (ref 65–99)

## 2016-10-01 MED ORDER — SENNOSIDES-DOCUSATE SODIUM 8.6-50 MG PO TABS: 1.0000 | ORAL_TABLET | Freq: Every day | ORAL | Status: DC

## 2016-10-01 MED ORDER — POTASSIUM CHLORIDE CRYS ER 15 MEQ PO TBCR: 30.0000 meq | EXTENDED_RELEASE_TABLET | Freq: Once | ORAL | 0 refills | Status: DC

## 2016-10-01 NOTE — Progress Notes (Signed)
Report called to Ramona, Nurse, at [facility] to assume care of patient at discharge today. Patient has been assigned to [address] at facility. Social work spoke to next of kin and informed them of discharge, they voice understanding. VSS. Pt alert, oriented, appropriate. No distress. Pt placed in paper hospital gown and all belongings sent with patient to facility. Pt transported via Management consultant to [employer]. Durable DNR and discharge instructions sent in packet with PTAR, pt unable to sign a copy of discharge instructions because she is legally blind. However, discharge instructions were discussed with her.

## 2016-10-01 NOTE — Discharge Summary (Signed)
Physician Discharge Summary  [name]:[id number] DOB: [birth year] DOA: 09/18/2016  PCP: Georgann Housekeeper, MD  Admit date: 09/18/2016 Discharge date: 10/01/2016  Time spent: 45 minutes  Recommendations for Outpatient Follow-up:  1. Needs complete leg count as well as Chem-12 in about one week 2. Recommend avoidance of PPI therapy the floor couple of weeks unless absolutely indicated 3. Please note changes to medications at this admission 4. Has completed course of therapy for C. difficile colitis this hospital stay 5. 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 difficile diarrhea   Leukocytosis   Acute renal failure (ARF) (HCC)   Acute renal failure superimposed on stage 3 chronic kidney disease (HCC)   Hypokalemia   Drug-induced acute pancreatitis without infection or necrosis   Muscle weakness (generalized)   Discharge Condition: Fair  Diet recommendation: Diabetic heart healthy  Filed Weights   09/29/16 0622 09/30/16 0532 10/01/16 0653  Weight: 99.3 kg (218 lb 14.7 oz) 97.9 kg (215 lb 13.3 oz) 99.8 kg (220 lb 0.3 oz)    History of present illness:  80 y.o.? [facility] resident since 2013 CAD heart cath 2007-medical management,  Diabetes-diabetic retinopathy resulting in bilateral visual losses/blindness hypertension,  Body mass index is 37.05 kg/m. NICM EF 30-35% systolic congestive heart failure-->Rpt 50-55% 01/2012, CKD stage III with anemia renal disease, HLD  OSA previously seen 2013 by Dr. Shelle Iron   presented  from [facility] assisted living for elevated creatinine.  Creatinine was 5.98 on admission, from baseline of 1.7.  She had experienced significant diarrhea in the setting of recent abx (levaquin and clindamycin) for PNA and UTI. C. diff on stool sample PTA was positive for C diff and flagyl had been started.  She was admitted for IV fluids and ongoing antibiotics.  became fluid overloaded- has underwent diuresis with increased doses of lasix from home dose with steady improvement in renal function.  She developed worsening abdominal pain 10/4 and CT abdomen showed constipation and associated colonic inflammation as well as possible pancreatitis. Lipase was 350mg /dl.  Pt was made NPO, maintenance IV fluids given and morphine prn pain with slow resolution over the next few days.  Diet  restarted 10/9 advanced to bland 10/10.    Hospital Course:   Acute pancreatitis: Lipase improving 350 > 102. CRP 7.8 > 4.6.  - Maintenance IVF d/c on 09/30/16 - clear liquids > full liquids yesterday >> bland diet as tolerated today.  Patient encrouaged to increase diet as an outpatient at skilled facility - Morphine discontinued on discharge  Acute renal failure superimposed on CKD stage 3:  returned to baseline. Of 1.39 on discharge Creatinine peak 5.98, baseline 1.7.  No acute findings on renal US early in admission.  Cause presumed to be prerenal initially due to dehydration from diarrhea causing oliguric ARF.  IV fluids produced edema, so lasix (a home med) was started with significant diuresis and improving renal clearance. - Continue lasix 20mg  po BID (home dose).   C. difficile colitis:  Acute, associated with abx for PNA and UTI CDiff PCR  report in chart from SNF is positive. Diarrhea has been resolved since admission. - Replaced flagyl with po vancomycin (finish date 10/12) due to concern for drug-induced pancreatitis--this was discontinued after 10/01/2016 - Contact precautions needed for the  next month or so  Constipation:  CT abdomen with evidence of stercoral colitis. Since resolved.  Has been NPO, so expect decreased output. - Soap suds enema successful 10/6. Nonobstructive bowel pattern on XR 10/7 - Continue senna-ducosate as an outpatient as needed  Leukocytosis: Resolved. Likely related to pancreatitis - Blood cultures and urine culture drawn 10/6 - UA 10/6 showed yeast > Diflucan 150mg  po q72h x 3 - foley in situ and will bladder train 10/11 -If she is unable to void spontaneously will probably need full catheter and bladder training at facility  FTT:per physical therapy "Pt unable to stand with +2 assist, will need mechanical lift for OOB." - Continues to require SNF placement. - Continue to work with PT as able - Recommend outpatient palliative care consultation  Chronic HFrEF due to NICM: EF 30-35% - Volume status improving with lasix, tapered to home dose  - Monitor volume status on maintenance IVF  Controlled type 2 diabetes mellitus with blindness and proliferative retinopathy and diabetic nephropathy with long term insulin use: A1c on this admission 5.5, indicating good glycemic control - Check CBG as per nursing home protocol  Essential hypertension: Chronic. Controlled without home meds. - BP stable, Coreg 25 twice a day Norvasc 5 daily hydralazine 50 3 times a day were discontinued this admission  Anemia in chronic kidney disease: Hemoglobin stable - Monitor with CBC  Hypokalemia: Due to lasix and diarrhea - Supplemented and WNL    Discharge Exam: Vitals:   09/30/16 2115 10/01/16 0653  BP: (!) 102/38 (!) 119/44  Pulse: 78 78  Resp: 19 19  Temp: 98 F (36.7 C) 99.1 F (37.3 C)    General: Alert pleasant oriented no apparent distress Cardiovascular: S1-S2 no murmur rub or gallop  Respiratory: CTA  b Abd soft nt nd no rebound no gaurd  Discharge Instructions Current Discharge Medication List    START taking these medications    Details  potassium chloride (KLOR-CON M15) 15 MEQ tablet Take 2 tablets (30 mEq total) by mouth once. Qty: 2 tablet, Refills: 0      CONTINUE these medications which have NOT CHANGED   Details  allopurinol (ZYLOPRIM) 100 MG tablet Take 200 mg by mouth daily with breakfast.     Amino Acids-Protein Hydrolys (FEEDING SUPPLEMENT, PRO-STAT SUGAR FREE 64,) LIQD Take 30 mLs by mouth 2 (two) times daily.    atorvastatin (LIPITOR) 20 MG tablet Take 1 tablet (20 mg total) by mouth daily. Qty: 30 tablet, Refills: 1    brimonidine (ALPHAGAN) 0.2 % ophthalmic solution Place 1 drop into the left eye daily after breakfast.     Cranberry 450 MG TABS Take 1 tablet by mouth 2 (two) times daily.    docusate sodium 100 MG CAPS Take 100 mg by mouth 2 (two) times daily. Qty: 10 capsule, Refills: 0    ergocalciferol (VITAMIN D2) 50000 UNITS capsule Take 50,000 Units by mouth every Wednesday.     furosemide (LASIX) 20 MG tablet Take 20 mg by mouth 2 (two) times daily.    insulin glargine (LANTUS) 100 UNIT/ML injection Inject 12 Units into the skin at bedtime.     lamoTRIgine (LAMICTAL) 25 MG tablet Take 25 mg by mouth at bedtime.    Multiple Vitamins-Minerals (CERTAGEN PO) Take  1 tablet by mouth daily after breakfast.    Nutritional Supplements (RESOURCE 2.0) LIQD Take 1 Can by mouth 2 (two) times daily.    Probiotic Product (PROBIOTIC DAILY) CAPS Take 1 capsule by mouth 2 (two) times daily. Started 09/10 for 30 days    sennosides-docusate sodium (SENOKOT-S) 8.6-50 MG tablet Take 2 tablets by mouth daily with breakfast.     vitamin C (ASCORBIC ACID) 500 MG tablet Take 500 mg by mouth daily after breakfast.      STOP taking these medications     amLODipine (NORVASC) 5 MG tablet      carvedilol (COREG) 25 MG tablet      hydrALAZINE (APRESOLINE) 50 MG tablet      metroNIDAZOLE (FLAGYL) 500 MG tablet      QUEtiapine (SEROQUEL) 25 MG tablet        Allergies  Allergen Reactions  .  Heparin Other (See Comments)    hallucinations    . Lisinopril Cough      The results of significant diagnostics from this hospitalization (including imaging, microbiology, ancillary and laboratory) are listed below for reference.    Significant Diagnostic Studies: Ct Abdomen Pelvis Wo Contrast  Result Date: 09/23/2016 CLINICAL DATA:  80 year old female with stage III chronic renal disease, diabetes, hypertension and systolic congestive heart failure who presents with elevated creatinine of 1.9 and leukocytosis of 16.3. Recently treated for UTI and pneumonia in the past month. Currently with diarrhea positive for Celsius difficile. EXAM: CT ABDOMEN AND PELVIS WITHOUT CONTRAST TECHNIQUE: Multidetector CT imaging of the abdomen and pelvis was performed following the standard protocol without IV contrast. COMPARISON:  08/07/2010 CT FINDINGS: Lower chest: Small pleural effusions with adjacent atelectasis. There are 2 tiny calcified granulomas at the right lung base on the order of 3-4 mm without significant change. There is cardiomegaly with coronary arteriosclerosis. No significant pericardial effusion. Hepatobiliary: Tiny layering gallstones. No gallbladder wall thickening or significant distention. No biliary dilatation. The unenhanced liver demonstrates no acute abnormality. Pancreas: There is peripancreatic fatty induration. No focal pancreatic mass or ductal dilatation. Spleen: There is no splenomegaly. There may be a trace amount of fluid surrounding the spleen go images are somewhat limited due to motion artifacts. Adrenals/Urinary Tract: No obstructive uropathy, nephrolithiasis or definite renal mass. Foley decompression of the bladder is seen. Stomach/Bowel: Moderate stool retention along the ascending colon. There is diffuse mild thickening of the ascending colon suggestive of colitis. There is no bowel obstruction. No significant small bowel dilatation. Reproductive: Stable appearance of the  uterus and adnexa. Other: Some mild diffuse subcutaneous edema possibly representing some third spacing of fluid. Musculoskeletal: Lumbar spondylosis with multilevel degenerative facet arthropathy characterized by hypertrophy and sclerosis. Bilateral SI joint osteoarthritis. IMPRESSION: Mild thickening of the ascending colon contain a moderate degree of stool. Findings have the appearance of colitis possibly a stercoral colitis given the presence of the moderate volume of stool. Uncomplicated cholelithiasis. Peripancreatic fatty edema. Findings could be secondary to pancreatitis. Diffuse soft tissue induration suggestive of third spacing of fluid. Bilateral small pleural effusions with atelectasis. Stable right lower lobe granulomata. Electronically Signed   By: Tollie Eth M.D.   On: 09/23/2016 20:46   Dg Chest 2 View  Result Date: 09/18/2016 CLINICAL DATA:  Productive cough, nausea, vomiting and diarrhea for 1 week. EXAM: CHEST  2 VIEW COMPARISON:  Chest x-ray dated 12/27/2012. FINDINGS: Stable mild cardiomegaly. Lungs are clear. No pleural effusion or pneumothorax seen. Stable degenerative change within the thoracic spine and at the  bilateral shoulders. Stable ankylosis of the thoracic spine. No acute or suspicious osseous finding. IMPRESSION: No active cardiopulmonary disease. No evidence of pneumonia. Stable cardiomegaly. Electronically Signed   By: Bary Richard M.D.   On: 09/18/2016 13:31   US Renal  Result Date: 09/18/2016 CLINICAL DATA:  Acute renal failure. EXAM: RENAL / URINARY TRACT ULTRASOUND COMPLETE COMPARISON:  None. FINDINGS: Right Kidney: Length: 10.2 cm. Echogenicity within normal limits. No mass or hydronephrosis visualized. Left Kidney: Length: 10 cm. Echogenicity within normal limits. No mass or hydronephrosis visualized. Bladder: Appears normal for degree of bladder distention. IMPRESSION: Exam is somewhat limited due to body habitus. No definite renal abnormality is noted.  Electronically Signed   By: Lupita Raider, M.D.   On: 09/18/2016 18:48   Nm Pulmonary Perf And Vent  Result Date: 09/18/2016 CLINICAL DATA:  Positive D-dimer EXAM: NUCLEAR MEDICINE VENTILATION - PERFUSION LUNG SCAN TECHNIQUE: Ventilation images were obtained in multiple projections using inhaled aerosol Tc-73m DTPA. Perfusion images were obtained in multiple projections after intravenous injection of Tc-75m MAA. RADIOPHARMACEUTICALS:  40.0 mCi Technetium-73m DTPA aerosol inhalation and 4.2 mCi Technetium-10m MAA IV COMPARISON:  Chest radiographs dated 09/18/2016 FINDINGS: Ventilation: No focal ventilation defect. Perfusion: No wedge shaped peripheral perfusion defects to suggest acute pulmonary embolism. Corresponding chest radiograph is negative. IMPRESSION: No evidence of pulmonary embolism. Electronically Signed   By: Charline Bills M.D.   On: 09/18/2016 17:49   Dg Abd 2 Views  Result Date: 09/26/2016 CLINICAL DATA:  Initial evaluation for acute abdominal pain, diarrhea. EXAM: ABDOMEN - 2 VIEW COMPARISON:  Prior CT from 09/23/2016. FINDINGS: Paucity of gas somewhat limits evaluation of the bowels. No evidence for obstruction or ileus. No free air on lateral decubitus view. No soft tissue mass or abnormal calcification. Extensive degenerative changes seen throughout the visualized osseous structures. Visualized lungs are grossly clear. Scattered vascular calcifications noted. IMPRESSION: Nonobstructive bowel gas pattern with no radiographic evidence for acute intra-abdominal process. Electronically Signed   By: Rise Mu M.D.   On: 09/26/2016 18:20    Microbiology: Recent Results (from the past 240 hour(s))  Culture, Urine     Status: Abnormal   Collection Time: 09/25/16  4:00 PM  Result Value Ref Range Status   Specimen Description URINE, CLEAN CATCH  Final   Special Requests NONE  Final   Culture (A)  Final    >=100,000 COLONIES/mL YEAST 50,000 COLONIES/mL LACTOBACILLUS  SPECIES Standardized susceptibility testing for this organism is not available. Performed at [facility]    Report Status 09/27/2016 FINAL  Final  Culture, blood (Routine X 2) w Reflex to ID Panel     Status: None   Collection Time: 09/25/16  6:12 PM  Result Value Ref Range Status   Specimen Description BLOOD LEFT HAND  Final   Special Requests IN PEDIATRIC BOTTLE 1CC  Final   Culture   Final    NO GROWTH 5 DAYS Performed at [facility]    Report Status 09/30/2016 FINAL  Final  Culture, blood (Routine X 2) w Reflex to ID Panel     Status: None   Collection Time: 09/25/16  6:12 PM  Result Value Ref Range Status   Specimen Description BLOOD RIGHT HAND  Final   Special Requests BOTTLES DRAWN AEROBIC ONLY 5CC  Final   Culture   Final    NO GROWTH 5 DAYS Performed at [facility]    Report Status 09/30/2016 FINAL  Final     Labs:  Basic Metabolic Panel:  Recent Labs Lab 09/26/16 0628 09/27/16 0618 09/28/16 0457 09/30/16 0549 10/01/16 0535  NA 139 137 139 139 142  K 3.6 3.3* 3.2* 3.3* 3.4*  CL 105 104 103 107 107  CO2 23 24 26 26 27   GLUCOSE 95 147* 152* 157* 102*  BUN 55* 55* 48* 34* 27*  CREATININE 2.55* 2.31* 1.95* 1.57* 1.39*  CALCIUM 8.5* 8.1* 8.4* 8.5* 8.7*   Liver Function Tests:  Recent Labs Lab 09/25/16 0500 09/27/16 0618  AST 15 16  ALT 12* 10*  ALKPHOS 68 55  BILITOT 0.7 0.6  PROT 5.9* 5.3*  ALBUMIN 2.6* 2.4*    Recent Labs Lab 09/26/16 1812  LIPASE 102*   No results for input(s): AMMONIA in the last 168 hours. CBC:  Recent Labs Lab 09/26/16 0628 09/27/16 0618 09/28/16 0457 09/30/16 0549 10/01/16 0535  WBC 20.2* 13.1* 7.5 4.6 4.4  NEUTROABS  --   --   --   --  1.5*  HGB 10.7* 8.9* 9.6* 9.4* 9.2*  HCT 32.4* 26.9* 29.3* 29.0* 28.4*  MCV 95.0 96.4 96.4 98.3 98.3  PLT 308 285 288 305 296   Cardiac Enzymes: No results for input(s): CKTOTAL, CKMB, CKMBINDEX, TROPONINI in the last 168 hours. BNP: BNP (last 3  results)  Recent Labs  09/18/16 1309  BNP 270.6*    ProBNP (last 3 results) No results for input(s): PROBNP in the last 8760 hours.  CBG:  Recent Labs Lab 09/28/16 0734 09/28/16 2118 09/29/16 0742 09/30/16 0731 10/01/16 0733  GLUCAP 147* 173* 123* 137* 96       Signed:  Rhetta MuraSAMTANI, JAI-GURMUKH MD   [facility] 10/01/2016, 9:18 AM

## 2016-10-01 NOTE — Clinical Social Work Placement (Addendum)
Patient placed on [facility] list for transport at 12:30pm Nurse Given Report.   CLINICAL SOCIAL WORK PLACEMENT  NOTE  Date:  10/01/2016  Patient Details  Name: [name] MRN: [id number] Date of Birth: 1936/02/16  Clinical Social Work is seeking post-discharge placement for this patient at the Skilled  Nursing Facility level of care (*CSW will initial, date and re-position this form in  chart as items are completed):  No   Patient/family provided with [facility] Clinical Social Work Department's list of facilities offering this level of care within the geographic area requested by the patient (or if unable, by the patient's family).  No   Patient/family informed of their freedom to choose among providers that offer the needed level of care, that participate in Medicare, Medicaid or managed care program needed by the patient, have an available bed and are willing to accept the patient.  No   Patient/family informed of [facility]'s ownership interest in [facility] and [facility], as well as of the fact that they are under no obligation to receive care at these facilities.  PASRR submitted to EDS on       PASRR number received on       Existing PASRR number confirmed on 10/01/16     FL2 transmitted to all facilities in geographic area requested by pt/family on       FL2 transmitted to all facilities within larger geographic area on       Patient informed that his/her managed care company has contracts with or will negotiate with certain facilities, including the following:  [facility]     Yes   Patient/family informed of bed offers received.  Patient chooses bed at [facility]     Physician recommends and patient chooses bed at      Patient to be transferred to [facility] on 10/01/16.  Patient to be transferred to facility by [facility]     Patient family notified on 10/01/16 of transfer.  Name of family member notified:     Mosetta Putt   PHYSICIAN Please  sign DNR     Additional Comment:    _______________________________________________ Clearance Coots, LCSW 10/01/2016, 10:28 AM

## 2017-07-29 IMAGING — DX DG ABDOMEN 2V
2 series · 2 of 2 positions shown · non-contrast
Comparison: Prior CT from 09/23/2016.

CLINICAL DATA: Initial evaluation for acute abdominal pain,
diarrhea.

EXAM:
ABDOMEN - 2 VIEW

[abdomen supine]
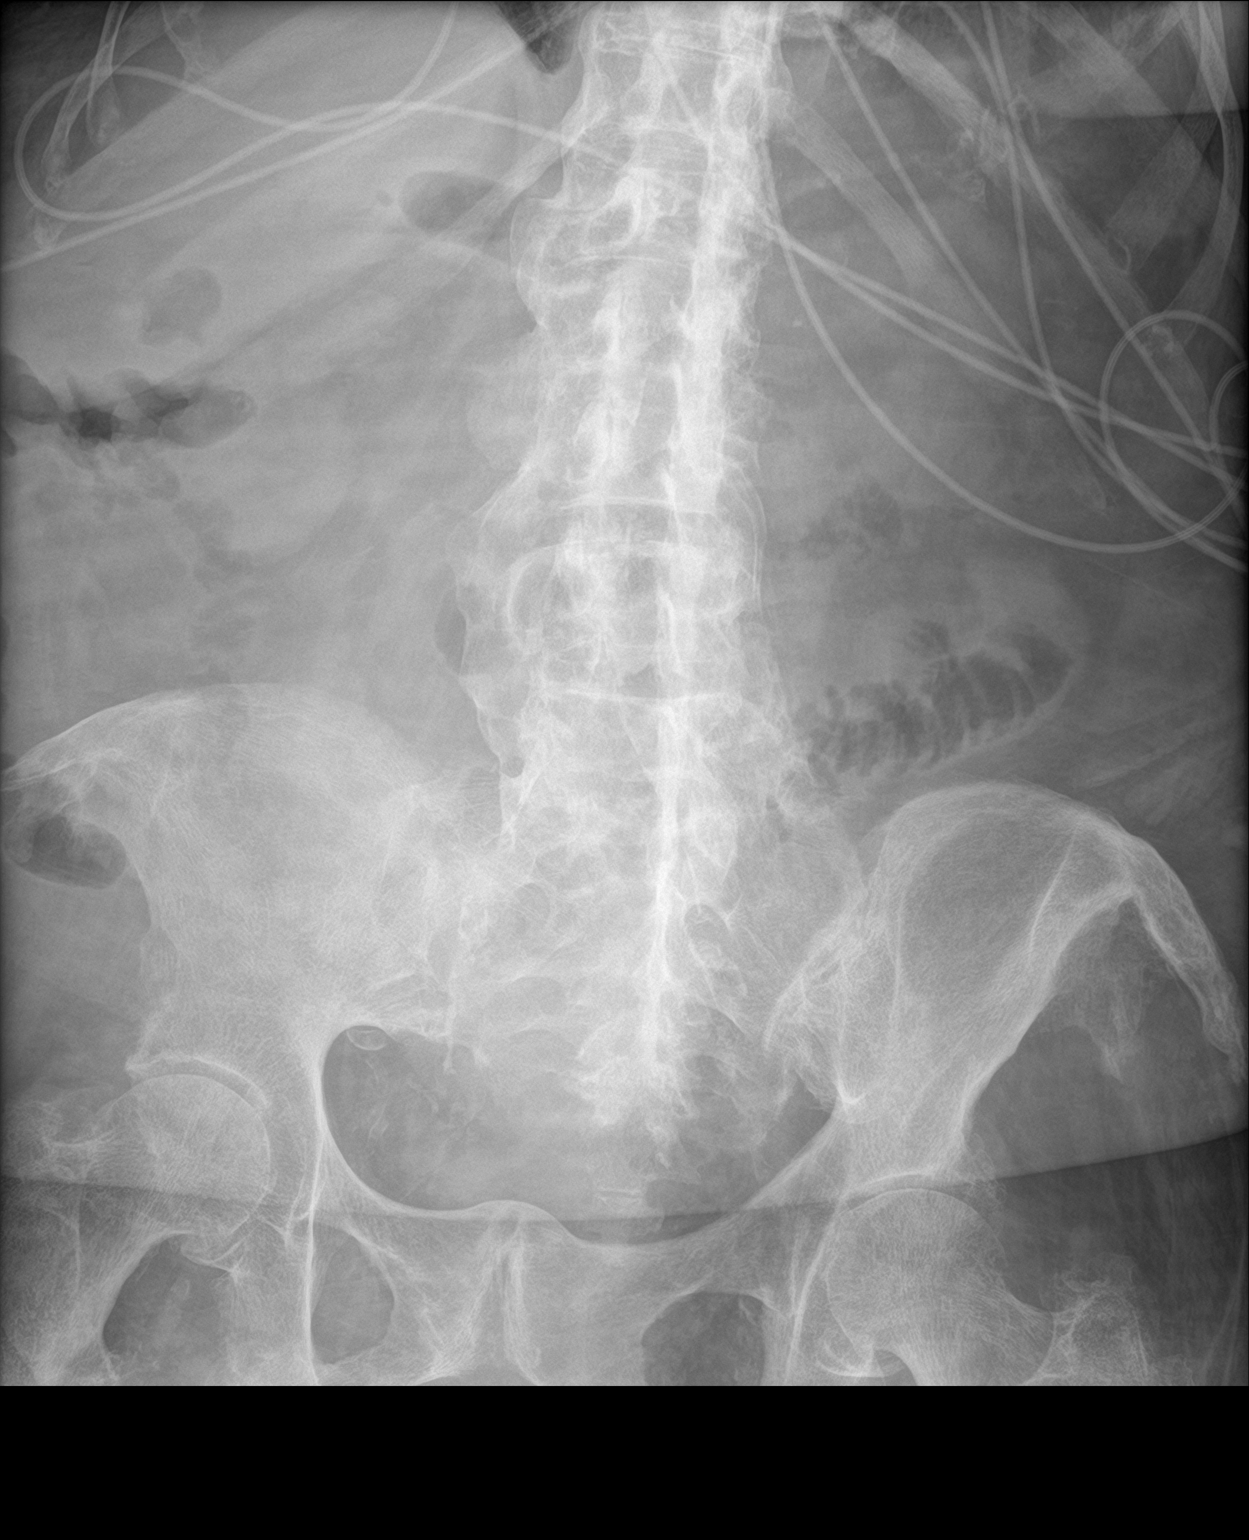

[abdomen decu]
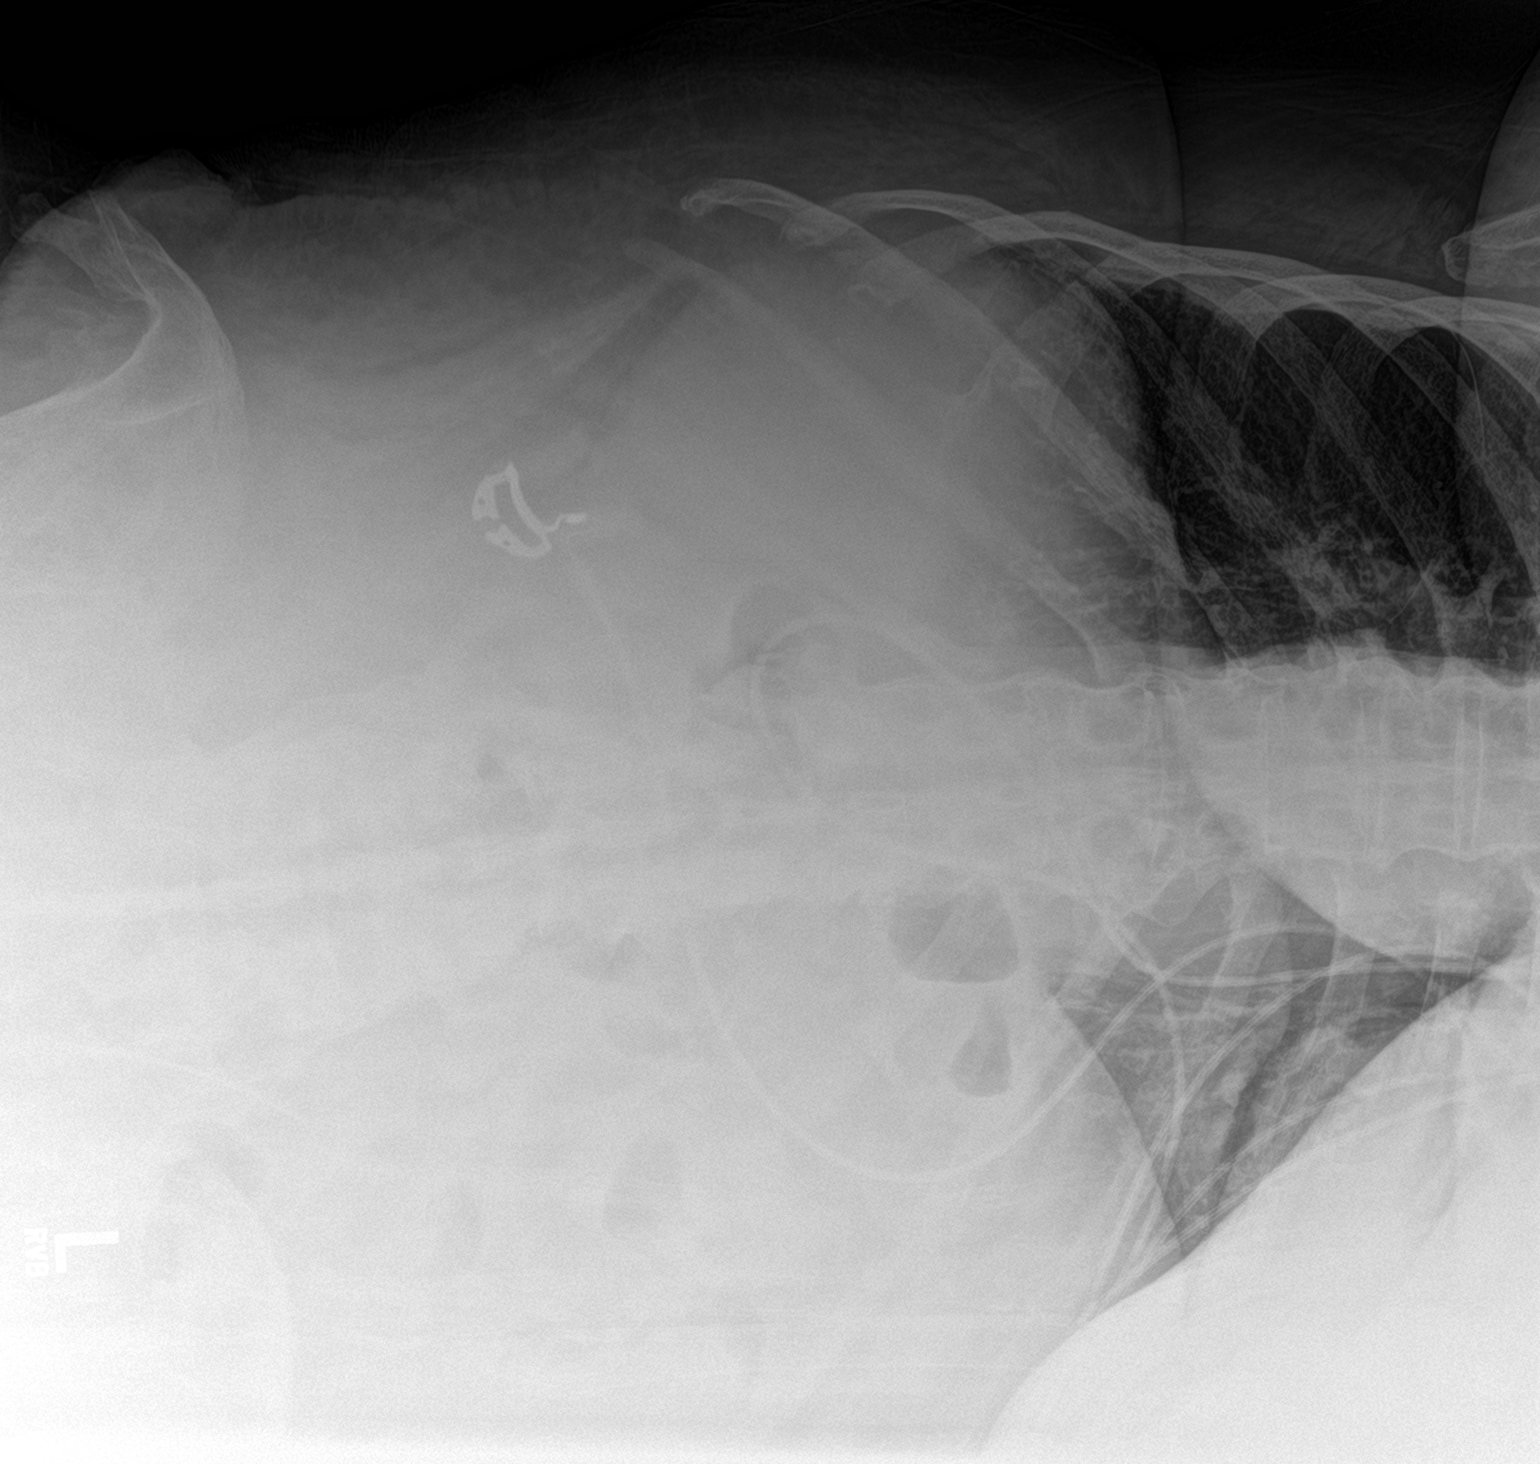

[2 of 2 positions shown; findings below may reference images not displayed]

FINDINGS: Paucity of gas somewhat limits evaluation of the bowels. No evidence
for obstruction or ileus. No free air on lateral decubitus view. No
soft tissue mass or abnormal calcification. Extensive degenerative
changes seen throughout the visualized osseous structures.
Visualized lungs are grossly clear. Scattered vascular
calcifications noted.
IMPRESSION: Nonobstructive bowel gas pattern with no radiographic evidence for
acute intra-abdominal process.

## 2018-10-14 ENCOUNTER — Other Ambulatory Visit: Payer: Self-pay | Admitting: Family Medicine

## 2018-10-14 DIAGNOSIS — I779 Disorder of arteries and arterioles, unspecified: Secondary | ICD-10-CM

## 2018-10-14 DIAGNOSIS — I10 Essential (primary) hypertension: Secondary | ICD-10-CM

## 2018-10-14 DIAGNOSIS — I739 Peripheral vascular disease, unspecified: Secondary | ICD-10-CM

## 2018-10-15 ENCOUNTER — Other Ambulatory Visit: Payer: Self-pay | Admitting: Family Medicine

## 2018-10-17 ENCOUNTER — Other Ambulatory Visit: Payer: Self-pay | Admitting: Family Medicine

## 2018-10-17 DIAGNOSIS — R200 Anesthesia of skin: Secondary | ICD-10-CM

## 2018-10-18 ENCOUNTER — Ambulatory Visit
Admission: RE | Admit: 2018-10-18 | Discharge: 2018-10-18 | Disposition: A | Discharge status: Home / Self Care | Payer: No Typology Code available for payment source | Source: Ambulatory Visit | Attending: Family Medicine | Admitting: Family Medicine

## 2018-10-18 DIAGNOSIS — R200 Anesthesia of skin: Secondary | ICD-10-CM

## 2020-02-24 ENCOUNTER — Ambulatory Visit: Payer: Medicare (Managed Care)

## 2020-03-23 ENCOUNTER — Encounter (HOSPITAL_COMMUNITY): Payer: Self-pay | Admitting: *Deleted

## 2020-03-23 ENCOUNTER — Emergency Department (HOSPITAL_COMMUNITY): Payer: Medicare (Managed Care)

## 2020-03-23 ENCOUNTER — Inpatient Hospital Stay (HOSPITAL_COMMUNITY)
Admission: EM | Admit: 2020-03-23 | Discharge: 2020-04-20 | DRG: 871 | Disposition: E | Payer: Medicare (Managed Care) | Attending: Internal Medicine | Admitting: Internal Medicine

## 2020-03-23 ENCOUNTER — Other Ambulatory Visit: Payer: Self-pay

## 2020-03-23 DIAGNOSIS — N1832 Chronic kidney disease, stage 3b: Secondary | ICD-10-CM | POA: Diagnosis present

## 2020-03-23 DIAGNOSIS — D631 Anemia in chronic kidney disease: Secondary | ICD-10-CM | POA: Diagnosis present

## 2020-03-23 DIAGNOSIS — N170 Acute kidney failure with tubular necrosis: Secondary | ICD-10-CM | POA: Diagnosis present

## 2020-03-23 DIAGNOSIS — H409 Unspecified glaucoma: Secondary | ICD-10-CM | POA: Diagnosis present

## 2020-03-23 DIAGNOSIS — Z539 Procedure and treatment not carried out, unspecified reason: Secondary | ICD-10-CM | POA: Diagnosis not present

## 2020-03-23 DIAGNOSIS — K219 Gastro-esophageal reflux disease without esophagitis: Secondary | ICD-10-CM | POA: Diagnosis present

## 2020-03-23 DIAGNOSIS — I5033 Acute on chronic diastolic (congestive) heart failure: Secondary | ICD-10-CM | POA: Diagnosis not present

## 2020-03-23 DIAGNOSIS — R109 Unspecified abdominal pain: Secondary | ICD-10-CM

## 2020-03-23 DIAGNOSIS — N179 Acute kidney failure, unspecified: Secondary | ICD-10-CM

## 2020-03-23 DIAGNOSIS — Z01810 Encounter for preprocedural cardiovascular examination: Secondary | ICD-10-CM | POA: Diagnosis not present

## 2020-03-23 DIAGNOSIS — I10 Essential (primary) hypertension: Secondary | ICD-10-CM | POA: Diagnosis not present

## 2020-03-23 DIAGNOSIS — I081 Rheumatic disorders of both mitral and tricuspid valves: Secondary | ICD-10-CM | POA: Diagnosis present

## 2020-03-23 DIAGNOSIS — I428 Other cardiomyopathies: Secondary | ICD-10-CM | POA: Diagnosis present

## 2020-03-23 DIAGNOSIS — F419 Anxiety disorder, unspecified: Secondary | ICD-10-CM | POA: Diagnosis present

## 2020-03-23 DIAGNOSIS — Z7982 Long term (current) use of aspirin: Secondary | ICD-10-CM

## 2020-03-23 DIAGNOSIS — G9341 Metabolic encephalopathy: Secondary | ICD-10-CM | POA: Diagnosis present

## 2020-03-23 DIAGNOSIS — R6520 Severe sepsis without septic shock: Secondary | ICD-10-CM

## 2020-03-23 DIAGNOSIS — E113599 Type 2 diabetes mellitus with proliferative diabetic retinopathy without macular edema, unspecified eye: Secondary | ICD-10-CM | POA: Diagnosis present

## 2020-03-23 DIAGNOSIS — I5032 Chronic diastolic (congestive) heart failure: Secondary | ICD-10-CM

## 2020-03-23 DIAGNOSIS — E1122 Type 2 diabetes mellitus with diabetic chronic kidney disease: Secondary | ICD-10-CM | POA: Diagnosis present

## 2020-03-23 DIAGNOSIS — R34 Anuria and oliguria: Secondary | ICD-10-CM | POA: Diagnosis not present

## 2020-03-23 DIAGNOSIS — Z66 Do not resuscitate: Secondary | ICD-10-CM | POA: Diagnosis present

## 2020-03-23 DIAGNOSIS — E785 Hyperlipidemia, unspecified: Secondary | ICD-10-CM | POA: Diagnosis present

## 2020-03-23 DIAGNOSIS — A419 Sepsis, unspecified organism: Secondary | ICD-10-CM | POA: Diagnosis not present

## 2020-03-23 DIAGNOSIS — H5461 Unqualified visual loss, right eye, normal vision left eye: Secondary | ICD-10-CM | POA: Diagnosis present

## 2020-03-23 DIAGNOSIS — K8000 Calculus of gallbladder with acute cholecystitis without obstruction: Secondary | ICD-10-CM

## 2020-03-23 DIAGNOSIS — Z515 Encounter for palliative care: Secondary | ICD-10-CM | POA: Diagnosis not present

## 2020-03-23 DIAGNOSIS — G4733 Obstructive sleep apnea (adult) (pediatric): Secondary | ICD-10-CM | POA: Diagnosis present

## 2020-03-23 DIAGNOSIS — Z8249 Family history of ischemic heart disease and other diseases of the circulatory system: Secondary | ICD-10-CM

## 2020-03-23 DIAGNOSIS — H9190 Unspecified hearing loss, unspecified ear: Secondary | ICD-10-CM | POA: Diagnosis present

## 2020-03-23 DIAGNOSIS — I2510 Atherosclerotic heart disease of native coronary artery without angina pectoris: Secondary | ICD-10-CM | POA: Diagnosis present

## 2020-03-23 DIAGNOSIS — M1990 Unspecified osteoarthritis, unspecified site: Secondary | ICD-10-CM | POA: Diagnosis present

## 2020-03-23 DIAGNOSIS — Z888 Allergy status to other drugs, medicaments and biological substances status: Secondary | ICD-10-CM

## 2020-03-23 DIAGNOSIS — F209 Schizophrenia, unspecified: Secondary | ICD-10-CM | POA: Diagnosis present

## 2020-03-23 DIAGNOSIS — I5043 Acute on chronic combined systolic (congestive) and diastolic (congestive) heart failure: Secondary | ICD-10-CM | POA: Diagnosis present

## 2020-03-23 DIAGNOSIS — K810 Acute cholecystitis: Secondary | ICD-10-CM | POA: Diagnosis not present

## 2020-03-23 DIAGNOSIS — Z955 Presence of coronary angioplasty implant and graft: Secondary | ICD-10-CM

## 2020-03-23 DIAGNOSIS — N189 Chronic kidney disease, unspecified: Secondary | ICD-10-CM

## 2020-03-23 DIAGNOSIS — Z79899 Other long term (current) drug therapy: Secondary | ICD-10-CM

## 2020-03-23 DIAGNOSIS — R1011 Right upper quadrant pain: Secondary | ICD-10-CM | POA: Diagnosis present

## 2020-03-23 DIAGNOSIS — I130 Hypertensive heart and chronic kidney disease with heart failure and stage 1 through stage 4 chronic kidney disease, or unspecified chronic kidney disease: Secondary | ICD-10-CM | POA: Diagnosis present

## 2020-03-23 DIAGNOSIS — Z20822 Contact with and (suspected) exposure to covid-19: Secondary | ICD-10-CM | POA: Diagnosis present

## 2020-03-23 DIAGNOSIS — Z7189 Other specified counseling: Secondary | ICD-10-CM | POA: Diagnosis not present

## 2020-03-23 DIAGNOSIS — Z6841 Body Mass Index (BMI) 40.0 and over, adult: Secondary | ICD-10-CM | POA: Diagnosis not present

## 2020-03-23 LAB — CBC WITH DIFFERENTIAL/PLATELET
Abs Immature Granulocytes: 0.17 10*3/uL — ABNORMAL HIGH (ref 0.00–0.07)
Basophils Absolute: 0.1 10*3/uL (ref 0.0–0.1)
Basophils Relative: 0 %
Eosinophils Absolute: 0 10*3/uL (ref 0.0–0.5)
Eosinophils Relative: 0 %
HCT: 33.8 % — ABNORMAL LOW (ref 36.0–46.0)
Hemoglobin: 10.6 g/dL — ABNORMAL LOW (ref 12.0–15.0)
Immature Granulocytes: 1 %
Lymphocytes Relative: 7 %
Lymphs Abs: 1.5 10*3/uL (ref 0.7–4.0)
MCH: 33.1 pg (ref 26.0–34.0)
MCHC: 31.4 g/dL (ref 30.0–36.0)
MCV: 105.6 fL — ABNORMAL HIGH (ref 80.0–100.0)
Monocytes Absolute: 1.3 10*3/uL — ABNORMAL HIGH (ref 0.1–1.0)
Monocytes Relative: 6 %
Neutro Abs: 17.7 10*3/uL — ABNORMAL HIGH (ref 1.7–7.7)
Neutrophils Relative %: 86 %
Platelets: 154 10*3/uL (ref 150–400)
RBC: 3.2 MIL/uL — ABNORMAL LOW (ref 3.87–5.11)
RDW: 14.3 % (ref 11.5–15.5)
WBC: 20.7 10*3/uL — ABNORMAL HIGH (ref 4.0–10.5)
nRBC: 0 % (ref 0.0–0.2)

## 2020-03-23 LAB — COMPREHENSIVE METABOLIC PANEL
ALT: 29 U/L (ref 0–44)
AST: 48 U/L — ABNORMAL HIGH (ref 15–41)
Albumin: 3.1 g/dL — ABNORMAL LOW (ref 3.5–5.0)
Alkaline Phosphatase: 63 U/L (ref 38–126)
Anion gap: 8 (ref 5–15)
BUN: 20 mg/dL (ref 8–23)
CO2: 26 mmol/L (ref 22–32)
Calcium: 8.5 mg/dL — ABNORMAL LOW (ref 8.9–10.3)
Chloride: 105 mmol/L (ref 98–111)
Creatinine, Ser: 1.86 mg/dL — ABNORMAL HIGH (ref 0.44–1.00)
GFR calc Af Amer: 29 mL/min — ABNORMAL LOW (ref >60)
GFR calc non Af Amer: 25 mL/min — ABNORMAL LOW (ref >60)
Glucose, Bld: 120 mg/dL — ABNORMAL HIGH (ref 70–99)
Potassium: 3.8 mmol/L (ref 3.5–5.1)
Sodium: 139 mmol/L (ref 135–145)
Total Bilirubin: 1.4 mg/dL — ABNORMAL HIGH (ref 0.3–1.2)
Total Protein: 6.1 g/dL — ABNORMAL LOW (ref 6.5–8.1)

## 2020-03-23 LAB — RESPIRATORY PANEL BY RT PCR (FLU A&B, COVID)
Influenza A by PCR: NEGATIVE
Influenza B by PCR: NEGATIVE
SARS Coronavirus 2 by RT PCR: NEGATIVE

## 2020-03-23 LAB — PROTIME-INR
INR: 1.2 (ref 0.8–1.2)
Prothrombin Time: 15.2 seconds (ref 11.4–15.2)

## 2020-03-23 LAB — LIPASE, BLOOD: Lipase: 23 U/L (ref 11–51)

## 2020-03-23 LAB — LACTIC ACID, PLASMA
Lactic Acid, Venous: 2.2 mmol/L (ref 0.5–1.9)
Lactic Acid, Venous: 2.4 mmol/L (ref 0.5–1.9)

## 2020-03-23 LAB — APTT: aPTT: 33 seconds (ref 24–36)

## 2020-03-23 MED ORDER — SODIUM CHLORIDE 0.9 % IV BOLUS
500.0000 mL | Freq: Once | INTRAVENOUS | Status: AC
  Administered 2020-03-23: 500 mL via INTRAVENOUS

## 2020-03-23 MED ORDER — IOHEXOL 9 MG/ML PO SOLN
ORAL | Status: AC
  Administered 2020-03-23: 500 mL
  Filled 2020-03-23: qty 500

## 2020-03-23 MED ORDER — METRONIDAZOLE IN NACL 5-0.79 MG/ML-% IV SOLN
500.0000 mg | Freq: Three times a day (TID) | INTRAVENOUS | Status: DC
  Administered 2020-03-23 – 2020-04-01 (×27): 500 mg via INTRAVENOUS
  Filled 2020-03-23 (×28): qty 100

## 2020-03-23 MED ORDER — HYDROCODONE-ACETAMINOPHEN 5-325 MG PO TABS
1.0000 | ORAL_TABLET | ORAL | Status: DC | PRN
  Administered 2020-03-26 – 2020-03-27 (×2): 1 via ORAL
  Filled 2020-03-23 (×2): qty 1

## 2020-03-23 MED ORDER — LACTATED RINGERS IV BOLUS (SEPSIS)
1000.0000 mL | Freq: Once | INTRAVENOUS | Status: AC
  Administered 2020-03-23: 1000 mL via INTRAVENOUS

## 2020-03-23 MED ORDER — SODIUM CHLORIDE 0.9 % IV SOLN
2.0000 g | Freq: Once | INTRAVENOUS | Status: AC
  Administered 2020-03-23: 2 g via INTRAVENOUS
  Filled 2020-03-23: qty 2

## 2020-03-23 MED ORDER — SODIUM CHLORIDE 0.9 % IV SOLN
2.0000 g | INTRAVENOUS | Status: DC
  Administered 2020-03-24 – 2020-03-25 (×2): 2 g via INTRAVENOUS
  Filled 2020-03-23 (×3): qty 2

## 2020-03-23 MED ORDER — ENOXAPARIN SODIUM 30 MG/0.3ML ~~LOC~~ SOLN
30.0000 mg | SUBCUTANEOUS | Status: DC
  Administered 2020-03-23 – 2020-03-31 (×9): 30 mg via SUBCUTANEOUS
  Filled 2020-03-23 (×8): qty 0.3

## 2020-03-23 MED ORDER — ONDANSETRON HCL 4 MG/2ML IJ SOLN
4.0000 mg | Freq: Four times a day (QID) | INTRAMUSCULAR | Status: DC | PRN
  Administered 2020-03-27: 4 mg via INTRAVENOUS
  Filled 2020-03-23: qty 2

## 2020-03-23 MED ORDER — CARVEDILOL 6.25 MG PO TABS
6.2500 mg | ORAL_TABLET | Freq: Two times a day (BID) | ORAL | Status: DC
  Filled 2020-03-23: qty 1

## 2020-03-23 MED ORDER — IOHEXOL 9 MG/ML PO SOLN
500.0000 mL | ORAL | Status: AC
  Administered 2020-03-23: 500 mL via ORAL

## 2020-03-23 MED ORDER — ONDANSETRON HCL 4 MG PO TABS: 4.0000 mg | ORAL_TABLET | Freq: Four times a day (QID) | ORAL | Status: DC | PRN

## 2020-03-23 MED ORDER — METRONIDAZOLE IN NACL 5-0.79 MG/ML-% IV SOLN
500.0000 mg | Freq: Once | INTRAVENOUS | Status: DC
  Filled 2020-03-23: qty 100

## 2020-03-23 MED ORDER — SODIUM CHLORIDE 0.9 % IV SOLN: INTRAVENOUS | Status: DC

## 2020-03-23 MED ORDER — ONDANSETRON HCL 4 MG/2ML IJ SOLN
4.0000 mg | Freq: Once | INTRAMUSCULAR | Status: AC
  Administered 2020-03-23: 4 mg via INTRAVENOUS
  Filled 2020-03-23: qty 2

## 2020-03-23 NOTE — Progress Notes (Signed)
5E RN notified of need to get repeat lactate now and BP every hour

## 2020-03-23 NOTE — Progress Notes (Signed)
Received call for RN Dedricka (spelling?) from [facility] requesting update for their records. Told RN that pt would remain inpatient and be seen by consulted physician in the morning.

## 2020-03-23 NOTE — Progress Notes (Signed)
Notified bedside nurse of need to draw repeat lactic acid @ 1730. 

## 2020-03-23 NOTE — Progress Notes (Signed)
Notified bedside nurse of need to draw repeat lactic acid. 

## 2020-03-23 NOTE — ED Triage Notes (Signed)
Patient brought in by EMS for abdominal pain and vomiting. Patient recently discharged from SNF on 3/29 per EMS.

## 2020-03-23 NOTE — Progress Notes (Signed)
Pt resting comfortably in bed, denies pain or nausea, vss, low grade temp at 99.0 , BP being monitored Q1 hr, repeat lactic 2.4, text message sent to on-call provider Janyth Contes) to make aware, call bell in hand (patient blind), will continue to closely monitor.

## 2020-03-23 NOTE — Progress Notes (Signed)
Called report to [employer] on [place].

## 2020-03-23 NOTE — H&P (Signed)
History and Physical    [name]:[id number] DOB: August 18, 1936  DOA: 04/19/2020 PCP: Jethro Bastos, MD  Patient coming from: Home  Chief Complaint: Abdominal pain  HPI: [name] is a 84 y.o. female with medical history significant of clinically blind, diabetes which is diet controlled, hypertension, dCHF, CKD stage III and remote history of pancreatitis [facility], presented to the emergency department complaining of RUQ abdominal pain and vomiting for 1 day.  Patient reported after having her meal she developed severe abdominal pain, which was sharp in nature.  And started vomiting.  Later on she developed chills and had diarrhea.  Patient denies chest pain, shortness of breath, dizziness, weakness and cough.  ED Course: Vitals stable, CT of the abdomen revealed possible acute cholecystitis and cholelithiasis. White count 20 K with left shift, hemoglobin 10.6, creatinine of 1.86, this and total bili minimally elevated.  Renal ultrasound pending.  ER physician called as a code sepsis with sofa score of 2.  Surgical team was consulted which recommended to admit to medicine and treat with antibiotic for now.  Review of Systems:    General: no changes in body weight, no fevers  HEENT: no hearing changes or sore throat Respiratory: no dyspnea, coughing, wheezing CV: no chest pain, no palpitations GI:  See HPI GU: no dysuria, burning on urination, increased urinary frequency, hematuria  Ext:. No deformities,  Neuro: no unilateral weakness, numbness, or tingling, no vision change or hearing loss Skin: No rashes, lesions or wounds. MSK: No muscle spasm, no deformity, no limitation of range of movement in spin Heme: No easy bruising.  Travel history: No recent long distant travel.   Past Medical History:  Diagnosis Date  . Anxiety   . Arthritis   . Blood transfusion without reported diagnosis   . Chronic diastolic CHF (congestive heart  failure) (HCC)   . Coronary artery disease    LHC 05/25/06: No renal artery stenosis, proximal LAD 40%, proximal D1 40%  . Diabetes mellitus    a1c 7.3 in 10/2011  . GERD (gastroesophageal reflux disease)   . GLAUCOMA 12/06/2008   Vision Loss right eye   . HYPERLIPIDEMIA 12/27/2006  . Hypertension   . NICM (nonischemic cardiomyopathy) (HCC)   . OSA (obstructive sleep apnea) 03/04/2012  . Proliferative diabetic retinopathy 01/28/2009   Severe Disease with Near Complete Blindness.    . Schizophrenia (HCC)   . Stage III chronic kidney disease 01/10/2007  . Systolic heart failure (HCC)    Echo 10/09: EF 30-35%. Echo 2/13: EF 50-55%.  . Visual hallucinations 07/15/2011    Past Surgical History:  Procedure Laterality Date  . CORONARY ANGIOPLASTY WITH STENT PLACEMENT    . EYE SURGERY  2012   both eyes     reports that she has never smoked. She has never used smokeless tobacco. She reports that she does not drink alcohol or use drugs.  Allergies  Allergen Reactions  . Heparin Other (See Comments)    hallucinations    . Lisinopril Cough    Family History  Problem Relation Age of Onset  . Heart disease Maternal Grandmother   . Prostate cancer Father   . Colon cancer Maternal Aunt     Prior to Admission medications   Medication Sig Start Date End Date Taking? Authorizing Provider  allopurinol (ZYLOPRIM) 100 MG tablet Take 200 mg by mouth daily with breakfast.    Yes [provider]  aspirin EC 81 MG tablet  Take 81 mg by mouth daily.   Yes [provider]  carvedilol (COREG) 6.25 MG tablet Take 6.25 mg by mouth in the morning and at bedtime.   Yes [provider]  Cholecalciferol 1.25 MG (50000 UT) TABS Take 1 tablet by mouth in the morning and at bedtime.   Yes [provider]  furosemide (LASIX) 20 MG tablet Take 20 mg by mouth 2 (two) times daily.   Yes [provider]  potassium chloride (KLOR-CON) 10 MEQ tablet Take 10 mEq by mouth  daily.   Yes [provider]  sennosides-docusate sodium (SENOKOT-S) 8.6-50 MG tablet Take 2 tablets by mouth in the morning and at bedtime.    Yes [provider]  atorvastatin (LIPITOR) 20 MG tablet Take 1 tablet (20 mg total) by mouth daily. Patient not taking: Reported on 04/19/2020 12/30/12   Trinda Pascal, MD  docusate sodium 100 MG CAPS Take 100 mg by mouth 2 (two) times daily. Patient not taking: Reported on 04/07/2020 09/13/12   Gae Gallop, MD  ergocalciferol (VITAMIN D2) 50000 UNITS capsule Take 50,000 Units by mouth every Wednesday.     [provider]  potassium chloride (KLOR-CON M15) 15 MEQ tablet Take 2 tablets (30 mEq total) by mouth once. Patient not taking: Reported on 04/09/2020 10/01/16 10/01/16  Nita Sells, MD    Physical Exam: Vitals:   04/02/2020 1300 04/04/2020 1330 04/06/2020 1415 04/06/2020 1445  BP: (!) 114/49 (!) 120/48 140/67 114/89  Pulse:    86  Resp: (!) 21 (!) 22 (!) 22 15  Temp:      TempSrc:      SpO2:    100%  Weight:      Height:         Constitutional: NAD, calm, comfortable Eyes: Legally blind, able to see only sclera Neck: normal, supple, no masses, no thyromegaly Respiratory: clear to auscultation bilaterally, no wheezing, no crackles. Normal respiratory effort. No accessory muscle use.  Cardiovascular: Regular rate and rhythm, no murmurs / rubs / gallops. No extremity edema. 2+ pedal pulses. No carotid bruits.  Abdomen: Right upper quadrant tenderness, no masses palpated. No hepatosplenomegaly. Bowel sounds positive.  Musculoskeletal: no clubbing / cyanosis.  Patient does not ambulate on her own. Good ROM, no contractures. Normal muscle tone.  Skin: no rashes, lesions, ulcers. No induration Neurologic: CN 2-12 grossly intact. Sensation intact, DTR normal. Strength 5/5 in all 4.  Psychiatric: Normal judgment and insight. Alert and oriented x 3. Normal mood.    Labs on Admission: I have personally reviewed  following labs and imaging studies  CBC: Recent Labs  Lab 04/04/2020 1147  WBC 20.7*  NEUTROABS 17.7*  HGB 10.6*  HCT 33.8*  MCV 105.6*  PLT 500   Basic Metabolic Panel: Recent Labs  Lab 04/02/2020 1147  NA 139  K 3.8  CL 105  CO2 26  GLUCOSE 120*  BUN 20  CREATININE 1.86*  CALCIUM 8.5*   GFR: Estimated Creatinine Clearance: 24.6 mL/min (A) (by C-G formula based on SCr of 1.86 mg/dL (H)). Liver Function Tests: Recent Labs  Lab 04/18/2020 1147  AST 48*  ALT 29  ALKPHOS 63  BILITOT 1.4*  PROT 6.1*  ALBUMIN 3.1*   Recent Labs  Lab 04/17/2020 1147  LIPASE 23   No results for input(s): AMMONIA in the last 168 hours. Coagulation Profile: No results for input(s): INR, PROTIME in the last 168 hours. Cardiac Enzymes: No results for input(s): CKTOTAL, CKMB, CKMBINDEX, TROPONINI in  the last 168 hours. BNP (last 3 results) No results for input(s): PROBNP in the last 8760 hours. HbA1C: No results for input(s): HGBA1C in the last 72 hours. CBG: No results for input(s): GLUCAP in the last 168 hours. Lipid Profile: No results for input(s): CHOL, HDL, LDLCALC, TRIG, CHOLHDL, LDLDIRECT in the last 72 hours. Thyroid Function Tests: No results for input(s): TSH, T4TOTAL, FREET4, T3FREE, THYROIDAB in the last 72 hours. Anemia Panel: No results for input(s): VITAMINB12, FOLATE, FERRITIN, TIBC, IRON, RETICCTPCT in the last 72 hours. Urine analysis:    Component Value Date/Time   COLORURINE YELLOW 09/25/2016 1600   APPEARANCEUR CLOUDY (A) 09/25/2016 1600   LABSPEC 1.014 09/25/2016 1600   PHURINE 5.0 09/25/2016 1600   GLUCOSEU NEGATIVE 09/25/2016 1600   GLUCOSEU NEG mg/dL 54/62/7035 0093   HGBUR NEGATIVE 09/25/2016 1600   HGBUR negative 07/02/2009 1003   BILIRUBINUR NEGATIVE 09/25/2016 1600   KETONESUR NEGATIVE 09/25/2016 1600   PROTEINUR NEGATIVE 09/25/2016 1600   UROBILINOGEN 0.2 03/05/2015 1854   NITRITE NEGATIVE 09/25/2016 1600   LEUKOCYTESUR MODERATE (A) 09/25/2016  1600   Sepsis Labs: !!!!!!!!!!!!!!!!!!!!!!!!!!!!!!!!!!!!!!!!!!!! @LABRCNTIP (procalcitonin:4,lacticidven:4) )No results found for this or any previous visit (from the past 240 hour(s)).   Radiological Exams on Admission: CT ABDOMEN PELVIS WO CONTRAST  Result Date: 04/05/2020 CLINICAL DATA:  Right-sided abdominal pain. EXAM: CT ABDOMEN AND PELVIS WITHOUT CONTRAST TECHNIQUE: Multidetector CT imaging of the abdomen and pelvis was performed following the standard protocol without IV contrast. COMPARISON:  September 23, 2016 FINDINGS: Lower chest: Small bilateral pleural effusions and bibasilar atelectasis. Calcific atherosclerotic disease of the coronary arteries. Hepatobiliary: No focal liver abnormality is seen. Numerous layering gallstones. Indistinctness of the gallbladder wall with pericholecystic fat stranding and small amount of fluid tracking from the gallbladder fossa to the right flank. Pancreas: Unremarkable. No pancreatic ductal dilatation or surrounding inflammatory changes. Spleen: Normal in size without focal abnormality. Adrenals/Urinary Tract: Adrenal glands are unremarkable. Kidneys are normal, without renal calculi, focal lesion, or hydronephrosis. Bladder is unremarkable. Stomach/Bowel: Mild indistinctness and mucosal wall thickening of the second portions of the duodenum. Normal caliber of the stomach, small bowel and colon. Vascular/Lymphatic: Aortic atherosclerosis. No enlarged abdominal or pelvic lymph nodes. Reproductive: Uterus and bilateral adnexa are unremarkable. Other: No abdominal wall hernia or abnormality. Small amount of free fluid in the anterior pelvis. Musculoskeletal: Extensive spondylosis of the lumbosacral spine. IMPRESSION: 1. Cholelithiasis with indistinctness of the gallbladder wall, pericholecystic fat stranding and small amount of fluid tracking from the gallbladder fossa to the right flank, and right pericolic gutter. Findings are suspicious for acute cholecystitis.  Confirmation with right upper quadrant ultrasound may be considered, given this is a noncontrasted study and the gallbladder wall is poorly evaluated. 2. Mild indistinctness and mucosal wall thickening of the second portions of the duodenum, likely reactive. 3. Small bilateral pleural effusions and bibasilar atelectasis. 4. Calcific atherosclerotic disease of the coronary arteries. Aortic Atherosclerosis (ICD10-I70.0). These results were called by telephone at the time of interpretation on 03/27/2020 at 3:24 pm to provider Dr. 05/23/2020, who verbally acknowledged these results. Electronically Signed   By: Rhunette Croft M.D.   On: 04/15/2020 15:28    EKG: reviewed.   Assessment/Plan Sepsis due to acute cholecystitis, present on admission. Will admit to medical floor, started on empiric antibiotic with cefepime and Flagyl we will continue the same.  Start IV fluid at 50 cc/h, monitor vital signs every 4 hours.  Lactic acid pending.  Acute cholecystitis Surgical consultation appreciated, will continue antibiotics  for now okay for clear liquids.  Possibly planning on percutaneous cholecystostomy.  Given her history will monitor for the next 24 hours, if symptoms improving vital signs stable she may be a candidate for surgical procedure.  Acute on CKD stage III  Hypoperfusion from sepsis, start IV fluids.  Avoid nephrotoxic agents.  Continue to monitor renal function on daily basis.  HTN BP seems to be stable, will continue home medication.  Chronic diastolic CHF  No signs of fluid overload, she is on Lasix at home.  Will hold this due to AKI.  Monitor for signs of fluid overload as patient is on IV fluid.  DVT prophylaxis: Lovenox   Code Status: DNR  Family Communication: Spoke with daughter [name] via phone call.  Disposition Plan: Anticipate discharge to previous home environment.  Consults called: Surgery Dr. Luisa Hart Admission status: Inpatient, Medsurg    Latrelle Dodrill MD [facility] 04/18/2020, 4:11 PM

## 2020-03-23 NOTE — Progress Notes (Signed)
Notified bedside nurse of need to get frequent BP's, pt code sepsis until 2132

## 2020-03-23 NOTE — Plan of Care (Signed)

## 2020-03-23 NOTE — Progress Notes (Signed)
A consult was received from an ED physician for cefepime per pharmacy dosing.  The patient's profile has been reviewed for ht/wt/allergies/indication/available labs.   A one time order has been placed for cefepime 2 gm IV x 1 dose.    Further antibiotics/pharmacy consults should be ordered by admitting physician if indicated.                       Thank you, Herby Abraham, Pharm.D [phone] 04/10/2020 3:39 PM

## 2020-03-23 NOTE — ED Notes (Signed)
Pure wick [employer].  

## 2020-03-23 NOTE — ED Notes (Signed)
US at bedside

## 2020-03-23 NOTE — ED Provider Notes (Signed)
[facility]-EMERGENCY DEPT Provider Note   CSN: [id number] Arrival date & time: April 03, 2020  1127     History Chief Complaint  Patient presents with  . Abdominal Pain    [name] is a 84 y.o. female.  Patient is an 84 year old female with a history of diabetes, hypertension, hyperlipidemia, CHF, chronic kidney disease and medication induced pancreatitis who presents today with abdominal pain and vomiting.  Patient states yesterday the right side of her abdomen was hurting and she started having emesis yesterday evening.  She reports the pain is much better today but she is still had some vomiting.  She has tried drinking some water but has not tried eating anything.  She felt chilled yesterday but does not know if she had a fever.  She denies any chest pain, shortness of breath, new cough or extremity pain.  She has had no new medications recently.  She has had no prior abdominal surgeries.  The history is provided by the patient and medical records.  Abdominal Pain Pain location:  RLQ and RUQ Pain quality: aching, cramping and shooting   Pain radiates to:  Does not radiate Pain severity:  Moderate (currently improved) Onset quality:  Gradual Duration:  1 day Timing:  Constant Progression:  Partially resolved Chronicity:  New Relieved by:  None tried Worsened by:  Eating Ineffective treatments:  None tried Associated symptoms: anorexia, chills, nausea and vomiting   Associated symptoms: no chest pain, no cough, no diarrhea, no dysuria, no fever and no shortness of breath   Risk factors: being elderly        Past Medical History:  Diagnosis Date  . Anxiety   . Arthritis   . Blood transfusion without reported diagnosis   . Chronic diastolic CHF (congestive heart failure) (HCC)   . Coronary artery disease    LHC 05/25/06: No renal artery stenosis, proximal LAD 40%, proximal D1 40%  . Diabetes mellitus    a1c 7.3 in 10/2011  . GERD  (gastroesophageal reflux disease)   . GLAUCOMA 12/06/2008   Vision Loss right eye   . HYPERLIPIDEMIA 12/27/2006  . Hypertension   . NICM (nonischemic cardiomyopathy) (HCC)   . OSA (obstructive sleep apnea) 03/04/2012  . Proliferative diabetic retinopathy 01/28/2009   Severe Disease with Near Complete Blindness.    . Schizophrenia (HCC)   . Stage III chronic kidney disease 01/10/2007  . Systolic heart failure (HCC)    Echo 10/09: EF 30-35%. Echo 2/13: EF 50-55%.  . Visual hallucinations 07/15/2011    Patient Active Problem List   Diagnosis Date Noted  . Drug-induced acute pancreatitis without infection or necrosis   . Muscle weakness (generalized)   . Acute renal failure superimposed on stage 3 chronic kidney disease (HCC)   . Hypokalemia   . C. difficile diarrhea 09/18/2016  . Leukocytosis 09/18/2016  . Acute renal failure (ARF) (HCC) 09/18/2016  . Anemia in stage 3 chronic kidney disease 11/06/2014  . Acute encephalopathy 11/05/2014  . Diabetes (HCC) 10/04/2014  . Type II or unspecified type diabetes mellitus with renal manifestations, uncontrolled(250.42) 10/13/2013  . Gout, unspecified 09/14/2013  . Urinary tract infection, site not specified 08/31/2013  . Edema 07/06/2013  . Encounter for long-term (current) use of other medications 07/06/2013  . Anemia in chronic kidney disease 07/05/2013  . 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 Type II or unspecified type diabetes mellitus with renal manifestations, not stated as uncontrolled(250.40) 06/02/2013  . Unspecified constipation 04/20/2013  . Psychosis (  [place]) 04/20/2013  . Right ankle sprain 12/30/2012  . Edema of foot 12/28/2012  . Acute on chronic renal failure ([place]) 12/27/2012  . Nonischemic cardiomyopathy ([place]) 12/27/2012  . Dysuria 12/27/2012  . UTI (lower urinary tract infection) 09/10/2012  . Chronic diastolic congestive heart failure ([place]) 04/01/2012  . OSA (obstructive sleep apnea) 03/04/2012  .  Preventative health care 11/19/2011  . Insomnia 08/04/2011  . Boil of buttock 08/04/2011  . Visual hallucinations 07/15/2011  . Proliferative diabetic retinopathy ([place]) 01/28/2009  . GLAUCOMA 12/06/2008  . Coronary atherosclerosis 10/19/2008  . ALLERGIC RHINITIS 01/03/2008  . Stage III chronic kidney disease 01/10/2007  . 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 HYPERLIPIDEMIA 12/27/2006  . Essential hypertension 12/27/2006  . Chronic diastolic heart failure ([place]) 12/27/2006    Past Surgical History:  Procedure Laterality Date  . CORONARY ANGIOPLASTY WITH STENT PLACEMENT    . EYE SURGERY  2012   both eyes     OB History   No obstetric history on file.     Family History  Problem Relation Age of Onset  . Heart disease Maternal Grandmother   . Prostate cancer Father   . Colon cancer Maternal Aunt     Social History   Tobacco Use  . Smoking status: Never Smoker  . Smokeless tobacco: Never Used  . Tobacco comment: does have second hand smoke exposure  Substance Use Topics  . Alcohol use: No  . Drug use: No    Home Medications Prior to Admission medications   Medication Sig Start Date End Date Taking? Authorizing Provider  allopurinol (ZYLOPRIM) 100 MG tablet Take 200 mg by mouth daily with breakfast.     [provider]  Amino Acids-Protein Hydrolys (FEEDING SUPPLEMENT, PRO-STAT SUGAR FREE 64,) LIQD Take 30 mLs by mouth 2 (two) times daily.    [provider]  atorvastatin (LIPITOR) 20 MG tablet Take 1 tablet (20 mg total) by mouth daily. Patient taking differently: Take 20 mg by mouth daily with breakfast.  12/30/12   Ziemer, Kelli Hope, MD  brimonidine (ALPHAGAN) 0.2 % ophthalmic solution Place 1 drop into the left eye daily after breakfast.     [provider]  Cranberry 450 MG TABS Take 1 tablet by mouth 2 (two) times daily.    [provider]  docusate sodium 100 MG CAPS Take 100  mg by mouth 2 (two) times daily. 09/13/12   McTyre, Emory R, MD  ergocalciferol (VITAMIN D2) 50000 UNITS capsule Take 50,000 Units by mouth every Wednesday.     [provider]  furosemide (LASIX) 20 MG tablet Take 20 mg by mouth 2 (two) times daily.    [provider]  insulin glargine (LANTUS) 100 UNIT/ML injection Inject 12 Units into the skin at bedtime.  12/30/12   Trinda Pascal, MD  lamoTRIgine (LAMICTAL) 25 MG tablet Take 25 mg by mouth at bedtime.    [provider]  Multiple Vitamins-Minerals (CERTAGEN PO) Take 1 tablet by mouth daily after breakfast.    [provider]  Nutritional Supplements (RESOURCE 2.0) LIQD Take 1 Can by mouth 2 (two) times daily.    [provider]  potassium chloride (KLOR-CON M15) 15 MEQ tablet Take 2 tablets (30 mEq total) by mouth once. 10/01/16 10/01/16  Nita Sells, MD  Probiotic Product (PROBIOTIC DAILY) CAPS Take 1 capsule by mouth 2 (two) times daily. Started 09/10 for 30 days    [provider]  sennosides-docusate  sodium (SENOKOT-S) 8.6-50 MG tablet Take 2 tablets by mouth daily with breakfast.     [provider]  vitamin C (ASCORBIC ACID) 500 MG tablet Take 500 mg by mouth daily after breakfast.    [provider]    Allergies    Heparin and Lisinopril  Review of Systems   Review of Systems  Constitutional: Positive for chills. Negative for fever.  Respiratory: Negative for cough and shortness of breath.   Cardiovascular: Negative for chest pain.  Gastrointestinal: Positive for abdominal pain, anorexia, nausea and vomiting. Negative for diarrhea.  Genitourinary: Negative for dysuria.  All other systems reviewed and are negative.   Physical Exam Updated Vital Signs BP 130/62 (BP Location: Left Arm)   Temp 98.7 F (37.1 C) (Oral)   Resp (!) 75   Ht 5\' 3"  (1.6 m)   Wt 85.7 kg   SpO2 99%   BMI 33.48 kg/m   Physical Exam Vitals and nursing note reviewed.   Constitutional:      General: She is not in acute distress.    Appearance: She is well-developed. She is obese.  HENT:     Head: Normocephalic and atraumatic.     Mouth/Throat:     Mouth: Mucous membranes are dry.  Eyes:     Comments: Blind in both eyes  Cardiovascular:     Rate and Rhythm: Normal rate and regular rhythm.     Heart sounds: Normal heart sounds. No murmur. No friction rub.  Pulmonary:     Effort: Pulmonary effort is normal.     Breath sounds: Normal breath sounds. No wheezing or rales.  Abdominal:     General: Bowel sounds are normal. There is no distension.     Palpations: Abdomen is soft.     Tenderness: There is abdominal tenderness in the right upper quadrant and right lower quadrant. There is guarding. There is no right CVA tenderness or rebound.  Musculoskeletal:        General: No tenderness. Normal range of motion.     Comments: Trace edema in bilateral lower extremities  Skin:    General: Skin is warm and dry.     Findings: No rash.  Neurological:     General: No focal deficit present.     Mental Status: She is alert and oriented to person, place, and time. Mental status is at baseline.     Cranial Nerves: No cranial nerve deficit.  Psychiatric:        Mood and Affect: Mood normal.        Behavior: Behavior normal.        Thought Content: Thought content normal.     ED Results / Procedures / Treatments   Labs (all labs ordered are listed, but only abnormal results are displayed) Labs Reviewed  CBC WITH DIFFERENTIAL/PLATELET - Abnormal; Notable for the following components:      Result Value   WBC 20.7 (*)    RBC 3.20 (*)    Hemoglobin 10.6 (*)    HCT 33.8 (*)    MCV 105.6 (*)    Neutro Abs 17.7 (*)    Monocytes Absolute 1.3 (*)    Abs Immature Granulocytes 0.17 (*)    All other components within normal limits  COMPREHENSIVE METABOLIC PANEL - Abnormal; Notable for the following components:   Glucose, Bld 120 (*)    Creatinine, Ser 1.86 (*)     Calcium 8.5 (*)    Total Protein 6.1 (*)    Albumin  3.1 (*)    AST 48 (*)    Total Bilirubin 1.4 (*)    GFR calc non Af Amer 25 (*)    GFR calc Af Amer 29 (*)    All other components within normal limits  LIPASE, BLOOD  URINALYSIS, ROUTINE W REFLEX MICROSCOPIC    EKG None  Radiology No results found.  Procedures Procedures (including critical care time)  Medications Ordered in ED Medications  ondansetron (ZOFRAN) injection 4 mg (has no administration in time range)    ED Course  I have reviewed the triage vital signs and the nursing notes.  Pertinent labs & imaging results that were available during my care of the patient were reviewed by me and considered in my medical decision making (see chart for details).    MDM Rules/Calculators/A&P                      Elderly female presenting today with nausea vomiting and abdominal pain.  She has right upper quadrant and right lower quadrant tenderness with guarding but more localized in the right lower quadrant.  Concern for appendicitis versus cholecystitis versus pancreatitis versus kidney stone versus UTI.  Lower suspicion for ischemic bowel.  Patient does not have significant findings concerning for pneumonia or CHF exacerbation and also low concern for cardiac cause.  CBC, CMP, lipase and UA pending.  Patient will need CT for further evaluation.  She was given IV antiemetic but reports the pain currently is not bad and she does not need pain medication.  1:19 PM Patient CBC, CMP and lipase was ordered and interpreted by myself.  Patient CBC with a new leukocytosis of 20,000 with stable hemoglobin of 10, CMP with persistent chronic kidney disease with creatinine of 1.86 which seems to be stable and no evidence of anion gap.  LFTs are relatively normal and total bilirubin is minimally elevated at 1.4 and lipase is within normal limits.  Will do CT to further evaluate.  Largest concern right now is for appendicitis versus  right-sided diverticulitis.  Patient remains hemodynamically stable.  Final Clinical Impression(s) / ED Diagnoses Final diagnoses:  None    Rx / DC Orders ED Discharge Orders    None       Gwyneth Sprout, MD 03/24/20 [phone]

## 2020-03-23 NOTE — Progress Notes (Signed)
CRITICAL VALUE STICKER  CRITICAL VALUE: lactic acid 2.2  RECEIVER (on-site recipient of call):   DATE & TIME NOTIFIED: 03/31/2020 at 1745  MESSENGER (representative from lab):  MD NOTIFIED: Edward Jolly  TIME OF NOTIFICATION:1745  RESPONSE: Lactic acid repeated per sepsis protocol  Md paged concerning pt need for higher level of care due to code sepsis continuation from ED. E link called Rn [place] and notified nursing staff that pt would need every 30 minute vital signs per code sepsis protocol. Md agreed to move but have no tele after RN Victorino Dike questioned this need. Transfer order placed by charge RN. Pt remained stable throughout this process.

## 2020-03-23 NOTE — ED Notes (Signed)
PT was provided lower extremity bath with peri-care, new brief and chuck applied, new purewick placed, PT repositioned.

## 2020-03-23 NOTE — Progress Notes (Signed)
Notified bedside nurse of need to draw lactic acid and blood cultures.  

## 2020-03-23 NOTE — Consult Note (Signed)
Reason for Consult: Cholecystitis Referring Physician: Derwood Kaplan, MD  [name] is an 84 y.o. female.  HPI: Asked to see patient at the request of the emergency room physician listed above for acute cholecystitis.  The patient is an 84 year old female with a 2-day history of right upper quadrant abdominal pain.  She has multiple medical problems including chronic renal insufficiency, congestive heart failure, clinically blind, hypertension, who presents from her skilled nursing facility with abdominal pain.  Location right upper quadrant and epigastrium.  It is a 5-6 out of 10 without radiation.  The pain is sharp in nature and made worse with compression.  Denies fever or chills.  She has never had this pain before.  No recent cardiac evaluation in the chart since 2017.  Denies shortness of breath or chest pain.  Past Medical History:  Diagnosis Date  . Anxiety   . Arthritis   . Blood transfusion without reported diagnosis   . Chronic diastolic CHF (congestive heart failure) (HCC)   . Coronary artery disease    LHC 05/25/06: No renal artery stenosis, proximal LAD 40%, proximal D1 40%  . Diabetes mellitus    a1c 7.3 in 10/2011  . GERD (gastroesophageal reflux disease)   . GLAUCOMA 12/06/2008   Vision Loss right eye   . HYPERLIPIDEMIA 12/27/2006  . Hypertension   . NICM (nonischemic cardiomyopathy) (HCC)   . OSA (obstructive sleep apnea) 03/04/2012  . Proliferative diabetic retinopathy 01/28/2009   Severe Disease with Near Complete Blindness.    . Schizophrenia (HCC)   . Stage III chronic kidney disease 01/10/2007  . Systolic heart failure (HCC)    Echo 10/09: EF 30-35%. Echo 2/13: EF 50-55%.  . Visual hallucinations 07/15/2011    Past Surgical History:  Procedure Laterality Date  . CORONARY ANGIOPLASTY WITH STENT PLACEMENT    . EYE SURGERY  2012   both eyes    Family History  Problem Relation Age of Onset  . Heart disease Maternal Grandmother   . Prostate cancer  Father   . Colon cancer Maternal Aunt     Social History:  reports that she has never smoked. She has never used smokeless tobacco. She reports that she does not drink alcohol or use drugs.  Allergies:  Allergies  Allergen Reactions  . Heparin Other (See Comments)    hallucinations    . Lisinopril Cough    Medications: I have reviewed the patient's current medications.  Results for orders placed or performed during the hospital encounter of 03/28/2020 (from the past 48 hour(s))  CBC with Differential/Platelet     Status: Abnormal   Collection Time: 03/31/2020 11:47 AM  Result Value Ref Range   WBC 20.7 (H) 4.0 - 10.5 K/uL   RBC 3.20 (L) 3.87 - 5.11 MIL/uL   Hemoglobin 10.6 (L) 12.0 - 15.0 g/dL   HCT 34.1 (L) 96.2 - 22.9 %   MCV 105.6 (H) 80.0 - 100.0 fL   MCH 33.1 26.0 - 34.0 pg   MCHC 31.4 30.0 - 36.0 g/dL   RDW 79.8 92.1 - 19.4 %   Platelets 154 150 - 400 K/uL   nRBC 0.0 0.0 - 0.2 %   Neutrophils Relative % 86 %   Neutro Abs 17.7 (H) 1.7 - 7.7 K/uL   Lymphocytes Relative 7 %   Lymphs Abs 1.5 0.7 - 4.0 K/uL   Monocytes Relative 6 %   Monocytes Absolute 1.3 (H) 0.1 - 1.0 K/uL   Eosinophils Relative 0 %  Eosinophils Absolute 0.0 0.0 - 0.5 K/uL   Basophils Relative 0 %   Basophils Absolute 0.1 0.0 - 0.1 K/uL   Immature Granulocytes 1 %   Abs Immature Granulocytes 0.17 (H) 0.00 - 0.07 K/uL    Comment: Performed at [facility], [address] [place], [place] [address]  Comprehensive metabolic panel     Status: Abnormal   Collection Time: 03-24-2020 11:47 AM  Result Value Ref Range   Sodium 139 135 - 145 mmol/L   Potassium 3.8 3.5 - 5.1 mmol/L   Chloride 105 98 - 111 mmol/L   CO2 26 22 - 32 mmol/L   Glucose, Bld 120 (H) 70 - 99 mg/dL    Comment: Glucose reference range applies only to samples taken after fasting for at least 8 hours.   BUN 20 8 - 23 mg/dL   Creatinine, Ser 6.06 (H) 0.44 - 1.00 mg/dL   Calcium 8.5 (L) 8.9 - 10.3 mg/dL   Total Protein  6.1 (L) 6.5 - 8.1 g/dL   Albumin 3.1 (L) 3.5 - 5.0 g/dL   AST 48 (H) 15 - 41 U/L   ALT 29 0 - 44 U/L   Alkaline Phosphatase 63 38 - 126 U/L   Total Bilirubin 1.4 (H) 0.3 - 1.2 mg/dL   GFR calc non Af Amer 25 (L) >60 mL/min   GFR calc Af Amer 29 (L) >60 mL/min   Anion gap 8 5 - 15    Comment: Performed at [facility], [address] [place], [place] [address]  Lipase, blood     Status: None   Collection Time: 03/24/2020 11:47 AM  Result Value Ref Range   Lipase 23 11 - 51 U/L    Comment: Performed at [facility], [address] [place], [place] [address]    CT ABDOMEN PELVIS WO CONTRAST  Result Date: 2020-03-24 CLINICAL DATA:  Right-sided abdominal pain. EXAM: CT ABDOMEN AND PELVIS WITHOUT CONTRAST TECHNIQUE: Multidetector CT imaging of the abdomen and pelvis was performed following the standard protocol without IV contrast. COMPARISON:  September 23, 2016 FINDINGS: Lower chest: Small bilateral pleural effusions and bibasilar atelectasis. Calcific atherosclerotic disease of the coronary arteries. Hepatobiliary: No focal liver abnormality is seen. Numerous layering gallstones. Indistinctness of the gallbladder wall with pericholecystic fat stranding and small amount of fluid tracking from the gallbladder fossa to the right flank. Pancreas: Unremarkable. No pancreatic ductal dilatation or surrounding inflammatory changes. Spleen: Normal in size without focal abnormality. Adrenals/Urinary Tract: Adrenal glands are unremarkable. Kidneys are normal, without renal calculi, focal lesion, or hydronephrosis. Bladder is unremarkable. Stomach/Bowel: Mild indistinctness and mucosal wall thickening of the second portions of the duodenum. Normal caliber of the stomach, small bowel and colon. Vascular/Lymphatic: Aortic atherosclerosis. No enlarged abdominal or pelvic lymph nodes. Reproductive: Uterus and bilateral adnexa are unremarkable. Other: No abdominal wall hernia or  abnormality. Small amount of free fluid in the anterior pelvis. Musculoskeletal: Extensive spondylosis of the lumbosacral spine. IMPRESSION: 1. Cholelithiasis with indistinctness of the gallbladder wall, pericholecystic fat stranding and small amount of fluid tracking from the gallbladder fossa to the right flank, and right pericolic gutter. Findings are suspicious for acute cholecystitis. Confirmation with right upper quadrant ultrasound may be considered, given this is a noncontrasted study and the gallbladder wall is poorly evaluated. 2. Mild indistinctness and mucosal wall thickening of the second portions of the duodenum, likely reactive. 3. Small bilateral pleural effusions and bibasilar atelectasis. 4. Calcific atherosclerotic disease of the  coronary arteries. Aortic Atherosclerosis (ICD10-I70.0). These results were called by telephone at the time of interpretation on 2020-03-27 at 3:24 pm to provider Dr. Rhunette Croft, who verbally acknowledged these results. Electronically Signed   By: Ted Mcalpine M.D.   On: 03/27/2020 15:28    Review of Systems  Constitutional: Negative.   HENT: Negative.   Eyes: Negative.   Respiratory: Negative.   Cardiovascular: Negative.   Gastrointestinal: Positive for abdominal pain and nausea.  Endocrine: Negative.   Genitourinary: Negative.   Musculoskeletal: Negative.   Neurological: Negative.   Hematological: Negative.   Psychiatric/Behavioral: Negative.    Blood pressure 114/89, pulse 86, temperature 98.7 F (37.1 C), temperature source Oral, resp. rate 15, height 5\' 3"  (1.6 m), weight 85.7 kg, SpO2 100 %. Physical Exam  Constitutional: She is oriented to person, place, and time. Vital signs are normal. She is cooperative.  HENT:  Head: Normocephalic and atraumatic.  Right Ear: External ear normal. No decreased hearing is noted.  Left Ear: External ear normal. No decreased hearing is noted.  Nose: Nose normal.  Eyes: Pupils are equal, round, and  reactive to light. No scleral icterus.  Clinically blind  Neck: No thyromegaly present.  Cardiovascular: Normal rate and regular rhythm.  Respiratory: Effort normal and breath sounds normal. No respiratory distress. She has no wheezes.  GI: She exhibits no distension. There is abdominal tenderness. There is negative Murphy's sign.  Musculoskeletal:        General: No deformity.     Cervical back: Normal range of motion and neck supple.     Comments: Patient does not ambulate on her own.  Neurological: She is alert and oriented to person, place, and time.  Skin: Skin is warm and dry.  Psychiatric: She has a normal mood and affect. Her behavior is normal. Judgment and thought content normal.    Assessment/Plan: Acute cholecystitis-patient will require medical clearance especially given her cardiac history.  She may require percutaneous cholecystostomy tube.  Recommend IV antibiotics and allow clear liquids for now.  Recommend medical/cardiac evaluation and admission since unclear if she is an appropriate operative candidate.  Patient Active Problem List   Diagnosis Date Noted  . Acute cholecystitis 03/27/2020  . Drug-induced acute pancreatitis without infection or necrosis   . Muscle weakness (generalized)   . Acute renal failure superimposed on stage 3 chronic kidney disease (HCC)   . Hypokalemia   . C. difficile diarrhea 09/18/2016  . Leukocytosis 09/18/2016  . Acute renal failure (ARF) (HCC) 09/18/2016  . Anemia in stage 3 chronic kidney disease 11/06/2014  . Acute encephalopathy 11/05/2014  . Diabetes (HCC) 10/04/2014  . Type II or unspecified type diabetes mellitus with renal manifestations, uncontrolled(250.42) 10/13/2013  . Gout, unspecified 09/14/2013  . Urinary tract infection, site not specified 08/31/2013  . Edema 07/06/2013  . Encounter for long-term (current) use of other medications 07/06/2013  . Anemia in chronic kidney disease 07/05/2013  . 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 Type II or unspecified type diabetes mellitus with renal manifestations, not stated as uncontrolled(250.40) 06/02/2013  . Unspecified constipation 04/20/2013  . Psychosis (HCC) 04/20/2013  . Right ankle sprain 12/30/2012  . Edema of foot 12/28/2012  . Acute on chronic renal failure (HCC) 12/27/2012  . Nonischemic cardiomyopathy (HCC) 12/27/2012  . Dysuria 12/27/2012  . UTI (lower urinary tract infection) 09/10/2012  . Chronic diastolic congestive heart failure (HCC) 04/01/2012  . OSA (obstructive sleep apnea) 03/04/2012  . Preventative health care 11/19/2011  . Insomnia  08/04/2011  . Boil of buttock 08/04/2011  . Visual hallucinations 07/15/2011  . Proliferative diabetic retinopathy ([place]) 01/28/2009  . GLAUCOMA 12/06/2008  . Coronary atherosclerosis 10/19/2008  . ALLERGIC RHINITIS 01/03/2008  . Stage III chronic kidney disease 01/10/2007  . 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 HYPERLIPIDEMIA 12/27/2006  . Essential hypertension 12/27/2006  . Chronic diastolic heart failure ([place]) 12/27/2006     Joyice Faster Lai Hendriks MD  04/14/2020, 4:14 PM

## 2020-03-23 NOTE — ED Provider Notes (Signed)
  Physical Exam  BP 114/89   Pulse 86   Temp 98.7 F (37.1 C) (Oral)   Resp 15   Ht 5\' 3"  (1.6 m)   Wt 85.7 kg   SpO2 100%   BMI 33.48 kg/m   Physical Exam  ED Course/Procedures     .Critical Care Performed by: , MD Authorized by: Derwood Kaplan, MD   Critical care provider statement:    Critical care time (minutes):  32   Critical care was necessary to treat or prevent imminent or life-threatening deterioration of the following conditions:  Sepsis   Critical care was time spent personally by me on the following activities:  Discussions with consultants, evaluation of patient's response to treatment, examination of patient, ordering and performing treatments and interventions, ordering and review of laboratory studies, ordering and review of radiographic studies, pulse oximetry, re-evaluation of patient's condition, obtaining history from patient or surrogate and review of old charts    MDM   Assuming care of patient from Dr. Derwood Kaplan.   Patient in the ED for abdominal pain. Workup thus far shows elevated white count, acute on chronic renal failure.  Concerning findings are as following  Important pending results are CT abdomen and pelvis w/o contrast  According to Dr. Tanna Savoy, plan is to f/u on CT scan. UA is also pending.  Patient had no complains, no concerns from the nursing side. Will continue to monitor.  Reassessment: On reassessment patient informs me that she is having pain in the lower part of her abdomen.  Pain started yesterday and it was excruciating initially.  On exam however she is noted to have right upper quadrant tenderness.  CT scan is showing signs of cholecystitis.  I see gallstones as well.  Ultrasound right upper quadrant added.  Reassessment: Radiology confirms clinical suspicion of cholecystitis at 330 p.m. Given that patient has elevated white count, elevated respiratory rate and worsening creatinine with sofa score of 2, she  clinically is septic because of her cholecystitis. We will initiate antibiotics and call code sepsis.  Surgery consulted.  Reassessment: Discussed case with Dr. Anitra Lauth, general surgery. He recommends that we admit the patient to medicine.  They will be on consult.  Patient has multiple medical comorbidities and he is unsure if she is healthy enough for surgery.  Results of the ER work-up has been discussed with the patient.  She is aware of antibiotics and admission. In addition to IV antibiotics we will start fluids as well.    Luisa Hart, MD 04/02/2020 1553

## 2020-03-23 NOTE — ED Notes (Signed)
Pt has expressed not voiding, no output in purewick container.

## 2020-03-24 ENCOUNTER — Encounter (HOSPITAL_COMMUNITY): Payer: Self-pay | Admitting: Family Medicine

## 2020-03-24 ENCOUNTER — Inpatient Hospital Stay (HOSPITAL_COMMUNITY): Payer: Medicare (Managed Care)

## 2020-03-24 DIAGNOSIS — K810 Acute cholecystitis: Secondary | ICD-10-CM | POA: Diagnosis not present

## 2020-03-24 DIAGNOSIS — I5033 Acute on chronic diastolic (congestive) heart failure: Secondary | ICD-10-CM

## 2020-03-24 DIAGNOSIS — Z01810 Encounter for preprocedural cardiovascular examination: Secondary | ICD-10-CM

## 2020-03-24 LAB — COMPREHENSIVE METABOLIC PANEL
ALT: 43 U/L (ref 0–44)
AST: 48 U/L — ABNORMAL HIGH (ref 15–41)
Albumin: 2.3 g/dL — ABNORMAL LOW (ref 3.5–5.0)
Alkaline Phosphatase: 54 U/L (ref 38–126)
Anion gap: 9 (ref 5–15)
BUN: 24 mg/dL — ABNORMAL HIGH (ref 8–23)
CO2: 23 mmol/L (ref 22–32)
Calcium: 7.8 mg/dL — ABNORMAL LOW (ref 8.9–10.3)
Chloride: 107 mmol/L (ref 98–111)
Creatinine, Ser: 2.01 mg/dL — ABNORMAL HIGH (ref 0.44–1.00)
GFR calc Af Amer: 26 mL/min — ABNORMAL LOW (ref >60)
GFR calc non Af Amer: 23 mL/min — ABNORMAL LOW (ref >60)
Glucose, Bld: 79 mg/dL (ref 70–99)
Potassium: 4 mmol/L (ref 3.5–5.1)
Sodium: 139 mmol/L (ref 135–145)
Total Bilirubin: 1.4 mg/dL — ABNORMAL HIGH (ref 0.3–1.2)
Total Protein: 5 g/dL — ABNORMAL LOW (ref 6.5–8.1)

## 2020-03-24 LAB — CBC
HCT: 26.5 % — ABNORMAL LOW (ref 36.0–46.0)
Hemoglobin: 8.5 g/dL — ABNORMAL LOW (ref 12.0–15.0)
MCH: 33.7 pg (ref 26.0–34.0)
MCHC: 32.1 g/dL (ref 30.0–36.0)
MCV: 105.2 fL — ABNORMAL HIGH (ref 80.0–100.0)
Platelets: 119 10*3/uL — ABNORMAL LOW (ref 150–400)
RBC: 2.52 MIL/uL — ABNORMAL LOW (ref 3.87–5.11)
RDW: 14.6 % (ref 11.5–15.5)
WBC: 22.4 10*3/uL — ABNORMAL HIGH (ref 4.0–10.5)
nRBC: 0 % (ref 0.0–0.2)

## 2020-03-24 LAB — LACTIC ACID, PLASMA
Lactic Acid, Venous: 1.5 mmol/L (ref 0.5–1.9)
Lactic Acid, Venous: 2.9 mmol/L (ref 0.5–1.9)

## 2020-03-24 LAB — ECHOCARDIOGRAM COMPLETE
Height: 63 in
Weight: 3024 oz

## 2020-03-24 LAB — GLUCOSE, CAPILLARY: Glucose-Capillary: 142 mg/dL — ABNORMAL HIGH (ref 70–99)

## 2020-03-24 MED ORDER — SODIUM CHLORIDE 0.9 % IV BOLUS
250.0000 mL | Freq: Once | INTRAVENOUS | Status: AC
  Administered 2020-03-24: 250 mL via INTRAVENOUS

## 2020-03-24 MED ORDER — SODIUM CHLORIDE 0.9 % IV SOLN: Freq: Once | INTRAVENOUS | Status: AC

## 2020-03-24 MED ORDER — SODIUM CHLORIDE 0.9 % IV BOLUS
500.0000 mL | Freq: Once | INTRAVENOUS | Status: AC
  Administered 2020-03-24: 500 mL via INTRAVENOUS

## 2020-03-24 NOTE — Progress Notes (Signed)
CRITICAL VALUE ALERT  Critical Value:  2.9 Lactic acid   Date & Time Notied:  03/24/2020 22:33 Provider Notified:  Np E. Ouma Orders Received/Actions taken: Paged provider awaiting orders    Will continue to monitor pt

## 2020-03-24 NOTE — Progress Notes (Signed)
Called MD to make him aware that pt still hasn't void spontaneously on her own. See new order received.

## 2020-03-24 NOTE — Progress Notes (Signed)
BP soft at 99/42. Dr Natale Milch aware Coreg due. MD didn't want to dc home meds. Waited a spell for BP to Increase.

## 2020-03-24 NOTE — Significant Event (Signed)
Rapid Response Event Note  Overview: Time Called: 2053 Arrival Time: 2100 Event Type: Hypotension  Initial Focused Assessment: RRT called for hypotensive, 94/54 MAP 54 SEE VSS, Patient has diagnosis of CHF.  Patinet alert and oriented x 4. Denies needs or wants. Patient also with decreased UOP. Notified    Interventions: Monitor VSS,  Monitor Lactic x 3  Primary to reach out to RRT and Provider if changes occur.     Plan of Care (if not transferred):  Event Summary: Lactic Acid x 3 Trickle bolus of 250 mL  Monitor for respiratory distress ( pulmonary edema. )   Name of Physician Notified: E.OUMA NP at 2126    at    Outcome: Stayed in room and stabalized  Event End Time: 2130  [facility] MSN, RN-BC  [facility] ICU/Stepdown  [facility]

## 2020-03-24 NOTE — Progress Notes (Signed)
In and out cathed pt at 1830. Able to dribble some prior to cath, then 10 ml results of foul smelling, dark yellow urine with white sediment. Total amt of urine resulting spontaneously and through cath approximately 25 ml. Dr. Natale Milch aware via phone. New orders received to hang another bag of NS at 50/hr times one bag.

## 2020-03-24 NOTE — Progress Notes (Signed)
Spoke with Dr. Natale Milch via phone about pt not voiding. Bladder scan with in hour showed 0 ml. No c/o pain nor discomfort. Received order to stop IVF and push PO and call back in an hour.

## 2020-03-24 NOTE — Progress Notes (Signed)
  Echocardiogram 2D Echocardiogram has been performed.  Penina Reisner G Uri Turnbough 03/24/2020, 10:25 AM

## 2020-03-24 NOTE — Progress Notes (Signed)
Subjective/Chief Complaint: Pt c/o pain to RUQ area, no n/v   Objective: Vital signs in last 24 hours: Temp:  [97.3 F (36.3 C)-99 F (37.2 C)] 98.9 F (37.2 C) (04/04 0600) Pulse Rate:  [79-91] 85 (04/04 0600) Resp:  [15-75] 19 (04/04 0600) BP: (92-140)/(44-89) 103/44 (04/04 0600) SpO2:  [92 %-100 %] 92 % (04/04 0600) Weight:  [85.7 kg] 85.7 kg (04/03 1153) Last BM Date: 03/21/2020  Intake/Output from previous day: 04/03 0701 - 04/04 0700 In: 1847.4 [P.O.:120; I.V.:527.4; IV Piggyback:1200] Out: 120 [Urine:120] Intake/Output this shift: No intake/output data recorded.  Constitutional: No acute distress, conversant, appears states age. Eyes: Anicteric sclerae, moist conjunctiva, no lid lag, blind Lungs: Clear to auscultation bilaterally, normal respiratory effort CV: regular rate and rhythm, no murmurs, no peripheral edema, pedal pulses 2+ GI: Soft, no masses or hepatosplenomegaly, tender to palpation RUQ Skin: No rashes, palpation reveals normal turgor Psychiatric: appropriate judgment and insight, oriented to person, place, and time    Lab Results:  Recent Labs    04/04/2020 1147 03/24/20 0550  WBC 20.7* 22.4*  HGB 10.6* 8.5*  HCT 33.8* 26.5*  PLT 154 119*   BMET Recent Labs    03/30/2020 1147 03/24/20 0550  NA 139 139  K 3.8 4.0  CL 105 107  CO2 26 23  GLUCOSE 120* 79  BUN 20 24*  CREATININE 1.86* 2.01*  CALCIUM 8.5* 7.8*   PT/INR Recent Labs    03/25/2020 1532  LABPROT 15.2  INR 1.2   Studies/Results: CT ABDOMEN PELVIS WO CONTRAST  Result Date: 04/09/2020 CLINICAL DATA:  Right-sided abdominal pain. EXAM: CT ABDOMEN AND PELVIS WITHOUT CONTRAST TECHNIQUE: Multidetector CT imaging of the abdomen and pelvis was performed following the standard protocol without IV contrast. COMPARISON:  September 23, 2016 FINDINGS: Lower chest: Small bilateral pleural effusions and bibasilar atelectasis. Calcific atherosclerotic disease of the coronary arteries.  Hepatobiliary: No focal liver abnormality is seen. Numerous layering gallstones. Indistinctness of the gallbladder wall with pericholecystic fat stranding and small amount of fluid tracking from the gallbladder fossa to the right flank. Pancreas: Unremarkable. No pancreatic ductal dilatation or surrounding inflammatory changes. Spleen: Normal in size without focal abnormality. Adrenals/Urinary Tract: Adrenal glands are unremarkable. Kidneys are normal, without renal calculi, focal lesion, or hydronephrosis. Bladder is unremarkable. Stomach/Bowel: Mild indistinctness and mucosal wall thickening of the second portions of the duodenum. Normal caliber of the stomach, small bowel and colon. Vascular/Lymphatic: Aortic atherosclerosis. No enlarged abdominal or pelvic lymph nodes. Reproductive: Uterus and bilateral adnexa are unremarkable. Other: No abdominal wall hernia or abnormality. Small amount of free fluid in the anterior pelvis. Musculoskeletal: Extensive spondylosis of the lumbosacral spine. IMPRESSION: 1. Cholelithiasis with indistinctness of the gallbladder wall, pericholecystic fat stranding and small amount of fluid tracking from the gallbladder fossa to the right flank, and right pericolic gutter. Findings are suspicious for acute cholecystitis. Confirmation with right upper quadrant ultrasound may be considered, given this is a noncontrasted study and the gallbladder wall is poorly evaluated. 2. Mild indistinctness and mucosal wall thickening of the second portions of the duodenum, likely reactive. 3. Small bilateral pleural effusions and bibasilar atelectasis. 4. Calcific atherosclerotic disease of the coronary arteries. Aortic Atherosclerosis (ICD10-I70.0). These results were called by telephone at the time of interpretation on 04/08/2020 at 3:24 pm to provider Dr. Rhunette Croft, who verbally acknowledged these results. Electronically Signed   By: Ted Mcalpine M.D.   On: 03/30/2020 15:28   US Abdomen  Limited RUQ  Result Date: 03/22/2020 CLINICAL  DATA:  Abdominal pain. EXAM: ULTRASOUND ABDOMEN LIMITED RIGHT UPPER QUADRANT COMPARISON:  Abdominal CT earlier today. FINDINGS: Gallbladder: Distended containing multiple intraluminal gallstones and sludge. There is wall thickening at 5-6 mm. Positive sonographic Murphy sign noted by sonographer. Common bile duct: Diameter: 4 mm proximally.  Distal aspect not well visualized. Liver: Technically limited and challenging exam. No focal lesion identified. Within normal limits in parenchymal echogenicity. Portal vein is patent on color Doppler imaging with normal direction of blood flow towards the liver. Other: None. IMPRESSION: 1. Sonographic findings highly suspicious for acute cholecystitis. Gallstones with wall thickening and positive sonographic Murphy sign. 2. No definite biliary dilatation. Distal common bile duct not well visualized due to bowel gas. Electronically Signed   By: Keith Rake M.D.   On: 04/09/2020 16:32    Anti-infectives: Anti-infectives (From admission, onward)   Start     Dose/Rate Route Frequency Ordered Stop   03/24/20 1600  ceFEPIme (MAXIPIME) 2 g in sodium chloride 0.9 % 100 mL IVPB     2 g 200 mL/hr over 30 Minutes Intravenous Every 24 hours 03/24/2020 1659     04/07/2020 1800  metroNIDAZOLE (FLAGYL) IVPB 500 mg     500 mg 100 mL/hr over 60 Minutes Intravenous Every 8 hours 04/16/2020 1700     03/31/2020 1545  ceFEPIme (MAXIPIME) 2 g in sodium chloride 0.9 % 100 mL IVPB     2 g 200 mL/hr over 30 Minutes Intravenous  Once 04/11/2020 1532 03/31/2020 1712   04/08/2020 1545  metroNIDAZOLE (FLAGYL) IVPB 500 mg  Status:  Discontinued     500 mg 100 mL/hr over 60 Minutes Intravenous  Once 04/15/2020 1532 04/18/2020 1659      Assessment/Plan: 24 M with acute cholecystitis Sepsis Acute on CKD Stg 3 HTN Chronic diastolic CHF  1.  Will await cardiac evaluation to assess if pt is an operative candidate.  If she is at mod/high risk would rec  IR per chole drain placement 2. Con't abx 3. con't NPO   LOS: 1 day    Ralene Ok 03/24/2020

## 2020-03-24 NOTE — Consult Note (Signed)
Cardiology Consultation:   Patient ID: [name] MRN: [id number]; DOB: 1936-02-25  Admit date: 03/30/2020 Date of Consult: 03/24/2020  Primary Care Provider: Jethro Bastos, MD Primary Cardiologist: Dr [name], Dr Gala Romney   Patient Profile:   [name] is a 84 y.o. female with a hx of chronic systolic congestive heart failure, schizophrenia, chronic stage III kidney disease, nonischemic cardiomyopathy, hypertension, hyperlipidemia, diabetes mellitus, nonobstructive coronary disease, blindness who is being seen today for preoperative evaluation prior to cholecystectomy at the request of Axel Filler MD.  History of Present Illness:   History of nonischemic cardiomyopathy.  Cardiac catheterization 2007 showed 40% proximal LAD and 40% first diagonal.  Patient had an ejection fraction of 30 to 35% in 2009 by echocardiogram.  Last echocardiogram February 2015 showed normalization of LV function with ejection fraction 55 to 60%.  Patient has been admitted with acute cholecystitis and cardiology asked to evaluate preoperatively.  Patient developed nausea, vomiting, chills and right upper quadrant pain yesterday.  She was admitted and diagnosed with cholecystitis.  Note she has very limited mobility.  She uses a wheelchair.  She can ambulate with assistance.  She denies increased dyspnea, chest pain, palpitations, syncope or pedal edema.   Past Medical History:  Diagnosis Date  . Anxiety   . Arthritis   . Blood transfusion without reported diagnosis   . Chronic diastolic CHF (congestive heart failure) (HCC)   . Coronary artery disease    LHC 05/25/06: No renal artery stenosis, proximal LAD 40%, proximal D1 40%  . Diabetes mellitus    a1c 7.3 in 10/2011  . GERD (gastroesophageal reflux disease)   . GLAUCOMA 12/06/2008   Vision Loss right eye   . HYPERLIPIDEMIA 12/27/2006  . Hypertension   . NICM (nonischemic cardiomyopathy) (HCC)   . OSA (obstructive sleep apnea)  03/04/2012  . Proliferative diabetic retinopathy 01/28/2009   Severe Disease with Near Complete Blindness.    . Schizophrenia (HCC)   . Stage III chronic kidney disease 01/10/2007  . Systolic heart failure (HCC)    Echo 10/09: EF 30-35%. Echo 2/13: EF 50-55%.  . Visual hallucinations 07/15/2011    Past Surgical History:  Procedure Laterality Date  . EYE SURGERY  2012   both eyes     Inpatient Medications: Scheduled Meds: . carvedilol  6.25 mg Oral BID WC  . enoxaparin (LOVENOX) injection  30 mg Subcutaneous Q24H   Continuous Infusions: . sodium chloride 50 mL/hr at 03/24/20 0600  . ceFEPime (MAXIPIME) IV    . metronidazole Stopped (03/24/20 0139)   PRN Meds: HYDROcodone-acetaminophen, ondansetron **OR** ondansetron (ZOFRAN) IV  Allergies:    Allergies  Allergen Reactions  . Heparin Other (See Comments)    hallucinations    . Lisinopril Cough    Social History:   Social History   Socioeconomic History  . Marital status: Widowed    Spouse name: Not on file  . Number of children: 5  . Years of education: RN school  . Highest education level: Not on file  Occupational History  . Occupation: Retired    Comment: formerly worked as a Chief Strategy Officer  Tobacco Use  . Smoking status: Never Smoker  . Smokeless tobacco: Never Used  . Tobacco comment: does have second hand smoke exposure  Substance and Sexual Activity  . Alcohol use: No  . Drug use: No  . Sexual activity: Never    Birth control/protection: Post-menopausal  Other Topics Concern  . Not on file  Social History Narrative  Lives with son who helps with her medications.   Social Determinants of Health   Financial Resource Strain:   . Difficulty of Paying Living Expenses:   Food Insecurity:   . Worried About Programme researcher, broadcasting/film/video in the Last Year:   . Barista in the Last Year:   Transportation Needs:   . Freight forwarder (Medical):   Marland Kitchen Lack of Transportation (Non-Medical):   Physical  Activity:   . Days of Exercise per Week:   . Minutes of Exercise per Session:   Stress:   . Feeling of Stress :   Social Connections:   . Frequency of Communication with Friends and Family:   . Frequency of Social Gatherings with Friends and Family:   . Attends Religious Services:   . Active Member of Clubs or Organizations:   . Attends Banker Meetings:   Marland Kitchen Marital Status:   Intimate Partner Violence:   . Fear of Current or Ex-Partner:   . Emotionally Abused:   Marland Kitchen Physically Abused:   . Sexually Abused:     Family History:   Family History  Problem Relation Age of Onset  . Heart disease Maternal Grandmother   . Prostate cancer Father   . Colon cancer Maternal Aunt      ROS:  Please see the history of present illness.  Nausea and vomiting, right upper quadrant pain, chills.  Patient blind. All other ROS reviewed and negative.     Physical Exam/Data:   Vitals:   03/24/20 0035 03/24/20 0157 03/24/20 0500 03/24/20 0600  BP: (!) 112/49 (!) 100/45 (!) 92/46 (!) 103/44  Pulse: 85 79 85 85  Resp: (!) 21 20 18 19   Temp: 98.6 F (37 C) 98.9 F (37.2 C) 98.5 F (36.9 C) 98.9 F (37.2 C)  TempSrc: Oral Oral Oral   SpO2: 96% 93% 93% 92%  Weight:      Height:        Intake/Output Summary (Last 24 hours) at 03/24/2020 0814 Last data filed at 03/24/2020 0600 Gross per 24 hour  Intake 1847.36 ml  Output 120 ml  Net 1727.36 ml   Last 3 Weights 04/13/2020 10/01/2016 09/30/2016  Weight (lbs) 189 lb 220 lb 0.3 oz 215 lb 13.3 oz  Weight (kg) 85.73 kg 99.8 kg 97.9 kg     Body mass index is 33.48 kg/m.  General:  Well nourished, well developed, in no acute distress HEENT: Blind Lymph: no adenopathy Neck: no JVD Endocrine:  No thryomegaly Vascular: No carotid bruits; FA pulses 2+ bilaterally without bruits  Cardiac:  normal S1, S2; RRR; no murmur  Lungs:  clear to auscultation bilaterally, no wheezing, rhonchi or rales  Abd: soft, tender to palpation RUQ Ext: no  edema Musculoskeletal:  No deformities, BUE and BLE strength normal and equal Skin: warm and dry  Neuro:  CNs 2-12 intact, no focal abnormalities noted Psych:  Normal affect   EKG:  The EKG was personally reviewed and demonstrates: Normal sinus rhythm, nonspecific ST changes, prolonged QT interval.  Chemistry Recent Labs  Lab 04/10/2020 1147 03/24/20 0550  NA 139 139  K 3.8 4.0  CL 105 107  CO2 26 23  GLUCOSE 120* 79  BUN 20 24*  CREATININE 1.86* 2.01*  CALCIUM 8.5* 7.8*  GFRNONAA 25* 23*  GFRAA 29* 26*  ANIONGAP 8 9    Recent Labs  Lab 03/25/2020 1147 03/24/20 0550  PROT 6.1* 5.0*  ALBUMIN 3.1* 2.3*  AST 48* 48*  ALT 29 43  ALKPHOS 63 54  BILITOT 1.4* 1.4*   Hematology Recent Labs  Lab 03/30/2020 1147 03/24/20 0550  WBC 20.7* 22.4*  RBC 3.20* 2.52*  HGB 10.6* 8.5*  HCT 33.8* 26.5*  MCV 105.6* 105.2*  MCH 33.1 33.7  MCHC 31.4 32.1  RDW 14.3 14.6  PLT 154 119*    Radiology/Studies:  CT ABDOMEN PELVIS WO CONTRAST  Result Date: 04/09/2020 CLINICAL DATA:  Right-sided abdominal pain. EXAM: CT ABDOMEN AND PELVIS WITHOUT CONTRAST TECHNIQUE: Multidetector CT imaging of the abdomen and pelvis was performed following the standard protocol without IV contrast. COMPARISON:  September 23, 2016 FINDINGS: Lower chest: Small bilateral pleural effusions and bibasilar atelectasis. Calcific atherosclerotic disease of the coronary arteries. Hepatobiliary: No focal liver abnormality is seen. Numerous layering gallstones. Indistinctness of the gallbladder wall with pericholecystic fat stranding and small amount of fluid tracking from the gallbladder fossa to the right flank. Pancreas: Unremarkable. No pancreatic ductal dilatation or surrounding inflammatory changes. Spleen: Normal in size without focal abnormality. Adrenals/Urinary Tract: Adrenal glands are unremarkable. Kidneys are normal, without renal calculi, focal lesion, or hydronephrosis. Bladder is unremarkable. Stomach/Bowel: Mild  indistinctness and mucosal wall thickening of the second portions of the duodenum. Normal caliber of the stomach, small bowel and colon. Vascular/Lymphatic: Aortic atherosclerosis. No enlarged abdominal or pelvic lymph nodes. Reproductive: Uterus and bilateral adnexa are unremarkable. Other: No abdominal wall hernia or abnormality. Small amount of free fluid in the anterior pelvis. Musculoskeletal: Extensive spondylosis of the lumbosacral spine. IMPRESSION: 1. Cholelithiasis with indistinctness of the gallbladder wall, pericholecystic fat stranding and small amount of fluid tracking from the gallbladder fossa to the right flank, and right pericolic gutter. Findings are suspicious for acute cholecystitis. Confirmation with right upper quadrant ultrasound may be considered, given this is a noncontrasted study and the gallbladder wall is poorly evaluated. 2. Mild indistinctness and mucosal wall thickening of the second portions of the duodenum, likely reactive. 3. Small bilateral pleural effusions and bibasilar atelectasis. 4. Calcific atherosclerotic disease of the coronary arteries. Aortic Atherosclerosis (ICD10-I70.0). These results were called by telephone at the time of interpretation on 03/24/2020 at 3:24 pm to provider Dr. Rhunette Croft, who verbally acknowledged these results. Electronically Signed   By: Ted Mcalpine M.D.   On: 04/06/2020 15:28   US Abdomen Limited RUQ  Result Date: 04/05/2020 CLINICAL DATA:  Abdominal pain. EXAM: ULTRASOUND ABDOMEN LIMITED RIGHT UPPER QUADRANT COMPARISON:  Abdominal CT earlier today. FINDINGS: Gallbladder: Distended containing multiple intraluminal gallstones and sludge. There is wall thickening at 5-6 mm. Positive sonographic Murphy sign noted by sonographer. Common bile duct: Diameter: 4 mm proximally.  Distal aspect not well visualized. Liver: Technically limited and challenging exam. No focal lesion identified. Within normal limits in parenchymal echogenicity. Portal  vein is patent on color Doppler imaging with normal direction of blood flow towards the liver. Other: None. IMPRESSION: 1. Sonographic findings highly suspicious for acute cholecystitis. Gallstones with wall thickening and positive sonographic Murphy sign. 2. No definite biliary dilatation. Distal common bile duct not well visualized due to bowel gas. Electronically Signed   By: Narda Rutherford M.D.   On: 03/27/2020 16:32    Assessment and Plan:   1. Preoperative evaluation prior to cholecystectomy-patient has had no recent cardiac symptoms but has very limited functional capacity.  I will arrange an echocardiogram to reassess LV function.  If normal I think it would be reasonable to proceed with cholecystectomy.  She will be at moderate risk given multiple comorbidities. 2. Chronic diastolic  congestive heart failure-I agree with holding Lasix while she is n.p.o.  Would resume home dose once she is tolerating oral intake. 3. History of nonischemic cardiomyopathy-improved on most recent echocardiogram.  We will repeat. 4. Hyperlipidemia-resume statin at discharge. 5. Hypertension-blood pressure is controlled.  Continue present medications and follow. 6. Cholecystitis-Per general surgery. 7. Chronic stage III kidney disease-follow renal function while in-house.  For questions or updates, please contact [facility] Please consult [email or web address] for contact info under     Signed, Kirk Ruths, MD  03/24/2020 8:14 AM

## 2020-03-24 NOTE — Progress Notes (Signed)
99/31 BP communicated to Dr Natale Milch. MD discontinued medication.

## 2020-03-24 NOTE — Progress Notes (Signed)
PROGRESS NOTE    [name]  OHY:[id number] DOB: 1936-07-05 DOA: 04/07/2020 PCP: Jethro Bastos, MD   Brief Narrative:  [name] is a 84 y.o. female with medical history significant of clinically blind, diabetes which is diet controlled, hypertension, dCHF, CKD stage III and remote history of pancreatitis who [facility], presented to the emergency department complaining of RUQ abdominal pain and vomiting for 1 day.  Patient reported after having her meal she developed severe abdominal pain, which was sharp in nature.  And started vomiting.  Later on she developed chills and had diarrhea.  Patient denies chest pain, shortness of breath, dizziness, weakness and cough. In ED: Vitals stable, CT of the abdomen revealed possible acute cholecystitis and cholelithiasis. White count 20 K with left shift, hemoglobin 10.6, creatinine of 1.86, this and total bili minimally elevated.  Renal ultrasound pending.  ER physician called as a code sepsis with sofa score of 2.  Surgical team was consulted which recommended to admit to medicine and treat with antibiotic for now.   Assessment & Plan:   Active Problems:   Acute cholecystitis   Sepsis due to acute cholecystitis, present on admission. General surgery following, pending cardiac clearance may proceed with lap chole versus cholecystostomy -Continue cefepime and Flagyl for coverage of abdomen -Currently very poor p.o. intake -Pending cardiac work-up likely surgical intervention in the next 24 to 48 hours.  Acute on CKD stage IIIb  Poor p.o. intake in the setting of above likely exacerbating CKD 3B Baseline appears to be around 1.5 per chart review Continue fluids, diet advanced to clears this am - will follow along Lab Results  Component Value Date   CREATININE 2.01 (H) 03/24/2020   CREATININE 1.86 (H) 04/06/2020   CREATININE 1.39 (H) 10/01/2016   HTN, essential - currently hypotensive -Discontinue  carvedilol, Lasix previously on hold given AKI -Follow clinically, blood pressure 90/30 today, patient does have wide pulse pressure which skews her MAP to be on the low side -Consider fluid challenge if symptomatic or blood pressure continues to drop  Chronic diastolic CHF  Appears euvolemic, hold Lasix given AKI as above  Cardiology following at the request of surgery, echocardiogram completed, follow formal read (last echo here 2015)  DVT prophylaxis: Lovenox   Code Status: DNR  Family Communication: Daughter [name] via phone call.  Disposition Plan: Anticipate discharge to previous home environment pending further evaluation, work-up and likely surgical intervention..  Consults called: Surgery Dr. Luisa Hart Admission status: Inpatient, Medsurg    Consultants:   General Surgery  Cardiology  Procedures:   None  Antimicrobials:  Cefepime, Flagyl   Subjective: No acute issues or events overnight, patient feels quite well, denies any nausea, vomiting, diarrhea, constipation, headache, fevers, chills.  She does indicate ongoing right upper quadrant abdominal pain but markedly improved on current medications.  Objective: Vitals:   03/24/20 0035 03/24/20 0157 03/24/20 0500 03/24/20 0600  BP: (!) 112/49 (!) 100/45 (!) 92/46 (!) 103/44  Pulse: 85 79 85 85  Resp: (!) 21 20 18 19   Temp: 98.6 F (37 C) 98.9 F (37.2 C) 98.5 F (36.9 C) 98.9 F (37.2 C)  TempSrc: Oral Oral Oral   SpO2: 96% 93% 93% 92%  Weight:      Height:        Intake/Output Summary (Last 24 hours) at 03/24/2020 0731 Last data filed at 03/24/2020 0600 Gross per 24 hour  Intake 1847.36 ml  Output 120 ml  Net 1727.36  ml   Filed Weights   03/22/2020 1153  Weight: 85.7 kg    Examination:  General:  Pleasantly resting in bed, No acute distress. HEENT:  Normocephalic atraumatic.  Sclerae nonicteric, noninjected.  Extraocular movements intact bilaterally. Neck:  Without mass or deformity.  Trachea is  midline. Lungs:  Clear to auscultate bilaterally without rhonchi, wheeze, or rales. Heart:  Regular rate and rhythm.  Without murmurs, rubs, or gallops. Abdomen:  Soft, moderate tenderness to right upper quadrant Extremities: Without cyanosis, clubbing, edema, or obvious deformity. Vascular:  Dorsalis pedis and posterior tibial pulses palpable bilaterally. Skin:  Warm and dry, no erythema, no ulcerations.   Data Reviewed: I have personally reviewed following labs and imaging studies  CBC: Recent Labs  Lab 03/31/2020 1147 03/24/20 0550  WBC 20.7* 22.4*  NEUTROABS 17.7*  --   HGB 10.6* 8.5*  HCT 33.8* 26.5*  MCV 105.6* 105.2*  PLT 154 119*   Basic Metabolic Panel: Recent Labs  Lab 04/15/2020 1147 03/24/20 0550  NA 139 139  K 3.8 4.0  CL 105 107  CO2 26 23  GLUCOSE 120* 79  BUN 20 24*  CREATININE 1.86* 2.01*  CALCIUM 8.5* 7.8*   GFR: Estimated Creatinine Clearance: 22.8 mL/min (A) (by C-G formula based on SCr of 2.01 mg/dL (H)). Liver Function Tests: Recent Labs  Lab 03/29/2020 1147 03/24/20 0550  AST 48* 48*  ALT 29 43  ALKPHOS 63 54  BILITOT 1.4* 1.4*  PROT 6.1* 5.0*  ALBUMIN 3.1* 2.3*   Recent Labs  Lab 03/29/2020 1147  LIPASE 23   No results for input(s): AMMONIA in the last 168 hours. Coagulation Profile: Recent Labs  Lab 03/31/2020 1532  INR 1.2   Cardiac Enzymes: No results for input(s): CKTOTAL, CKMB, CKMBINDEX, TROPONINI in the last 168 hours. BNP (last 3 results) No results for input(s): PROBNP in the last 8760 hours. HbA1C: No results for input(s): HGBA1C in the last 72 hours. CBG: No results for input(s): GLUCAP in the last 168 hours. Lipid Profile: No results for input(s): CHOL, HDL, LDLCALC, TRIG, CHOLHDL, LDLDIRECT in the last 72 hours. Thyroid Function Tests: No results for input(s): TSH, T4TOTAL, FREET4, T3FREE, THYROIDAB in the last 72 hours. Anemia Panel: No results for input(s): VITAMINB12, FOLATE, FERRITIN, TIBC, IRON, RETICCTPCT in  the last 72 hours. Sepsis Labs: Recent Labs  Lab 04/09/2020 1532 04/17/2020 1812 04/11/2020 2303  LATICACIDVEN 2.2* 2.4* 1.5    Recent Results (from the past 240 hour(s))  Blood Culture (routine x 2)     Status: None (Preliminary result)   Collection Time: 03/28/2020  3:32 PM   Specimen: BLOOD  Result Value Ref Range Status   Specimen Description   Final    BLOOD LEFT ANTECUBITAL Performed at [facility], [address], [place], [place] [address]    Special Requests   Final    BOTTLES DRAWN AEROBIC AND ANAEROBIC Blood Culture results may not be optimal due to an inadequate volume of blood received in culture bottles Performed at [facility], [address], [place], [place] [address]    Culture   Final    NO GROWTH < 12 HOURS Performed at [facility], [address] [place], [place] [address]    Report Status PENDING  Incomplete  Respiratory Panel by RT PCR (Flu A&B, Covid) - Nasopharyngeal Swab     Status: None   Collection Time: 04/08/2020  3:35 PM   Specimen: Nasopharyngeal Swab  Result Value Ref  Range Status   SARS Coronavirus 2 by RT PCR NEGATIVE NEGATIVE Final    Comment: (NOTE) SARS-CoV-2 target nucleic acids are NOT DETECTED. The SARS-CoV-2 RNA is generally detectable in upper respiratoy specimens during the acute phase of infection. The lowest concentration of SARS-CoV-2 viral copies this assay can detect is 131 copies/mL. A negative result does not preclude SARS-Cov-2 infection and should not be used as the sole basis for treatment or other patient management decisions. A negative result may occur with  improper specimen collection/handling, submission of specimen other than nasopharyngeal swab, presence of viral mutation(s) within the areas targeted by this assay, and inadequate number of viral copies (<131 copies/mL). A negative result must be combined with clinical observations, patient history, and epidemiological  information. The expected result is Negative. Fact Sheet for Patients:  https://www.moore.com/ Fact Sheet for Healthcare Providers:  https://www.young.biz/ This test is not yet ap proved or cleared by the [place] FDA and  has been authorized for detection and/or diagnosis of SARS-CoV-2 by FDA under an Emergency Use Authorization (EUA). This EUA will remain  in effect (meaning this test can be used) for the duration of the COVID-19 declaration under Section 564(b)(1) of the Act, 21 U.S.C. section 360bbb-3(b)(1), unless the authorization is terminated or revoked sooner.    Influenza A by PCR NEGATIVE NEGATIVE Final   Influenza B by PCR NEGATIVE NEGATIVE Final    Comment: (NOTE) The Xpert Xpress SARS-CoV-2/FLU/RSV assay is intended as an aid in  the diagnosis of influenza from Nasopharyngeal swab specimens and  should not be used as a sole basis for treatment. Nasal washings and  aspirates are unacceptable for Xpert Xpress SARS-CoV-2/FLU/RSV  testing. Fact Sheet for Patients: https://www.moore.com/ Fact Sheet for Healthcare Providers: https://www.young.biz/ This test is not yet approved or cleared by the [place] FDA and  has been authorized for detection and/or diagnosis of SARS-CoV-2 by  FDA under an Emergency Use Authorization (EUA). This EUA will remain  in effect (meaning this test can be used) for the duration of the  Covid-19 declaration under Section 564(b)(1) of the Act, 21  U.S.C. section 360bbb-3(b)(1), unless the authorization is  terminated or revoked. Performed at [facility], [address], [place], [place] [address]   Blood Culture (routine x 2)     Status: None (Preliminary result)   Collection Time: 04/07/2020  3:37 PM   Specimen: BLOOD RIGHT HAND  Result Value Ref Range Status   Specimen Description   Final    BLOOD RIGHT HAND Performed at [facility], [address] [place], [place] [address]    Special Requests   Final    BOTTLES DRAWN AEROBIC AND ANAEROBIC Blood Culture adequate volume Performed at [facility], [address] [place], [place] [address]    Culture   Final    NO GROWTH < 12 HOURS Performed at [facility], [address] [place], [place] [address]    Report Status PENDING  Incomplete         Radiology Studies: CT ABDOMEN PELVIS WO CONTRAST  Result Date: 04/15/2020 CLINICAL DATA:  Right-sided abdominal pain. EXAM: CT ABDOMEN AND PELVIS WITHOUT CONTRAST TECHNIQUE: Multidetector CT imaging of the abdomen and pelvis was performed following the standard protocol without IV contrast. COMPARISON:  September 23, 2016 FINDINGS: Lower chest: Small bilateral pleural effusions and bibasilar atelectasis. Calcific atherosclerotic disease of the coronary arteries. Hepatobiliary: No focal liver abnormality is seen. Numerous layering gallstones. Indistinctness of  the gallbladder wall with pericholecystic fat stranding and small amount of fluid tracking from the gallbladder fossa to the right flank. Pancreas: Unremarkable. No pancreatic ductal dilatation or surrounding inflammatory changes. Spleen: Normal in size without focal abnormality. Adrenals/Urinary Tract: Adrenal glands are unremarkable. Kidneys are normal, without renal calculi, focal lesion, or hydronephrosis. Bladder is unremarkable. Stomach/Bowel: Mild indistinctness and mucosal wall thickening of the second portions of the duodenum. Normal caliber of the stomach, small bowel and colon. Vascular/Lymphatic: Aortic atherosclerosis. No enlarged abdominal or pelvic lymph nodes. Reproductive: Uterus and bilateral adnexa are unremarkable. Other: No abdominal wall hernia or abnormality. Small amount of free fluid in the anterior pelvis. Musculoskeletal: Extensive spondylosis of the lumbosacral spine. IMPRESSION: 1. Cholelithiasis with  indistinctness of the gallbladder wall, pericholecystic fat stranding and small amount of fluid tracking from the gallbladder fossa to the right flank, and right pericolic gutter. Findings are suspicious for acute cholecystitis. Confirmation with right upper quadrant ultrasound may be considered, given this is a noncontrasted study and the gallbladder wall is poorly evaluated. 2. Mild indistinctness and mucosal wall thickening of the second portions of the duodenum, likely reactive. 3. Small bilateral pleural effusions and bibasilar atelectasis. 4. Calcific atherosclerotic disease of the coronary arteries. Aortic Atherosclerosis (ICD10-I70.0). These results were called by telephone at the time of interpretation on 04/03/2020 at 3:24 pm to provider Dr. Kathrynn Humble, who verbally acknowledged these results. Electronically Signed   By: Fidela Salisbury M.D.   On: 04/07/2020 15:28   US Abdomen Limited RUQ  Result Date: 04/07/2020 CLINICAL DATA:  Abdominal pain. EXAM: ULTRASOUND ABDOMEN LIMITED RIGHT UPPER QUADRANT COMPARISON:  Abdominal CT earlier today. FINDINGS: Gallbladder: Distended containing multiple intraluminal gallstones and sludge. There is wall thickening at 5-6 mm. Positive sonographic Murphy sign noted by sonographer. Common bile duct: Diameter: 4 mm proximally.  Distal aspect not well visualized. Liver: Technically limited and challenging exam. No focal lesion identified. Within normal limits in parenchymal echogenicity. Portal vein is patent on color Doppler imaging with normal direction of blood flow towards the liver. Other: None. IMPRESSION: 1. Sonographic findings highly suspicious for acute cholecystitis. Gallstones with wall thickening and positive sonographic Murphy sign. 2. No definite biliary dilatation. Distal common bile duct not well visualized due to bowel gas. Electronically Signed   By: Keith Rake M.D.   On: 04/13/2020 16:32        Scheduled Meds: . carvedilol  6.25 mg Oral  BID WC  . enoxaparin (LOVENOX) injection  30 mg Subcutaneous Q24H   Continuous Infusions: . sodium chloride 50 mL/hr at 03/24/20 0600  . ceFEPime (MAXIPIME) IV    . metronidazole Stopped (03/24/20 0139)     LOS: 1 day    Time spent: 96min  Ramiel Forti C Margareta Laureano, DO [facility]  If 7PM-7AM, please contact night-coverage [email or web address]  03/24/2020, 7:31 AM

## 2020-03-25 DIAGNOSIS — K810 Acute cholecystitis: Secondary | ICD-10-CM

## 2020-03-25 DIAGNOSIS — I2510 Atherosclerotic heart disease of native coronary artery without angina pectoris: Secondary | ICD-10-CM

## 2020-03-25 DIAGNOSIS — I10 Essential (primary) hypertension: Secondary | ICD-10-CM

## 2020-03-25 LAB — CBC
HCT: 25.2 % — ABNORMAL LOW (ref 36.0–46.0)
Hemoglobin: 7.9 g/dL — ABNORMAL LOW (ref 12.0–15.0)
MCH: 33.3 pg (ref 26.0–34.0)
MCHC: 31.3 g/dL (ref 30.0–36.0)
MCV: 106.3 fL — ABNORMAL HIGH (ref 80.0–100.0)
Platelets: 114 10*3/uL — ABNORMAL LOW (ref 150–400)
RBC: 2.37 MIL/uL — ABNORMAL LOW (ref 3.87–5.11)
RDW: 14.9 % (ref 11.5–15.5)
WBC: 20 10*3/uL — ABNORMAL HIGH (ref 4.0–10.5)
nRBC: 0 % (ref 0.0–0.2)

## 2020-03-25 LAB — COMPREHENSIVE METABOLIC PANEL
ALT: 38 U/L (ref 0–44)
AST: 46 U/L — ABNORMAL HIGH (ref 15–41)
Albumin: 2.4 g/dL — ABNORMAL LOW (ref 3.5–5.0)
Alkaline Phosphatase: 63 U/L (ref 38–126)
Anion gap: 8 (ref 5–15)
BUN: 29 mg/dL — ABNORMAL HIGH (ref 8–23)
CO2: 22 mmol/L (ref 22–32)
Calcium: 7.6 mg/dL — ABNORMAL LOW (ref 8.9–10.3)
Chloride: 105 mmol/L (ref 98–111)
Creatinine, Ser: 2.69 mg/dL — ABNORMAL HIGH (ref 0.44–1.00)
GFR calc Af Amer: 18 mL/min — ABNORMAL LOW (ref >60)
GFR calc non Af Amer: 16 mL/min — ABNORMAL LOW (ref >60)
Glucose, Bld: 122 mg/dL — ABNORMAL HIGH (ref 70–99)
Potassium: 4 mmol/L (ref 3.5–5.1)
Sodium: 135 mmol/L (ref 135–145)
Total Bilirubin: 1.5 mg/dL — ABNORMAL HIGH (ref 0.3–1.2)
Total Protein: 5.1 g/dL — ABNORMAL LOW (ref 6.5–8.1)

## 2020-03-25 LAB — GLUCOSE, CAPILLARY: Glucose-Capillary: 94 mg/dL (ref 70–99)

## 2020-03-25 LAB — LIPID PANEL
Cholesterol: 82 mg/dL (ref 0–200)
HDL: 32 mg/dL — ABNORMAL LOW (ref >40)
LDL Cholesterol: 37 mg/dL (ref 0–99)
Total CHOL/HDL Ratio: 2.6 RATIO
Triglycerides: 66 mg/dL (ref <150)
VLDL: 13 mg/dL (ref 0–40)

## 2020-03-25 LAB — LACTIC ACID, PLASMA: Lactic Acid, Venous: 1.7 mmol/L (ref 0.5–1.9)

## 2020-03-25 LAB — URINE CULTURE: Culture: NO GROWTH

## 2020-03-25 MED ORDER — CHLORHEXIDINE GLUCONATE CLOTH 2 % EX PADS
6.0000 | MEDICATED_PAD | Freq: Once | CUTANEOUS | Status: AC
  Administered 2020-03-25: 6 via TOPICAL

## 2020-03-25 MED ORDER — SODIUM CHLORIDE 0.9 % IV SOLN: INTRAVENOUS | Status: DC

## 2020-03-25 NOTE — Progress Notes (Signed)
Progress Note  Patient Name: [name] Date of Encounter: 03/25/2020  Primary Cardiologist: Losasso Rouge, MD   Subjective   No events overnight. Tentative plan for drain placement. Patient denies chest pain or sob.   Inpatient Medications    Scheduled Meds: . enoxaparin (LOVENOX) injection  30 mg Subcutaneous Q24H   Continuous Infusions: . ceFEPime (MAXIPIME) IV 2 g (03/24/20 1700)  . metronidazole 500 mg (03/25/20 0200)   PRN Meds: HYDROcodone-acetaminophen, ondansetron **OR** ondansetron (ZOFRAN) IV   Vital Signs    Vitals:   03/24/20 2334 03/25/20 0137 03/25/20 0412 03/25/20 0545  BP: (!) 95/32 (!) 91/35 (!) 91/34 (!) 91/40  Pulse: 81 86 87 89  Resp: 20 19 19 19   Temp: 98.8 F (37.1 C) 98.6 F (37 C) 98.9 F (37.2 C) 99.5 F (37.5 C)  TempSrc: Oral     SpO2: 95% 96% 97% 95%  Weight:      Height:        Intake/Output Summary (Last 24 hours) at 03/25/2020 0836 Last data filed at 03/25/2020 0100 Gross per 24 hour  Intake 397.49 ml  Output 50 ml  Net 347.49 ml   Last 3 Weights 04/14/2020 10/01/2016 09/30/2016  Weight (lbs) 189 lb 220 lb 0.3 oz 215 lb 13.3 oz  Weight (kg) 85.73 kg 99.8 kg 97.9 kg      Telemetry    N/A - Personally Reviewed  ECG    No new - Personally Reviewed  Physical Exam   GEN: No acute distress.   Neck: No JVD Cardiac: RRR, no murmurs, rubs, or gallops.  Respiratory:decreased breath sounds GI: Soft, mildly tender, non-distended  MS: No edema; No deformity. Neuro:  Nonfocal  Psych: Normal affect   Labs    High Sensitivity Troponin:  No results for input(s): TROPONINIHS in the last 720 hours.    Chemistry Recent Labs  Lab 03/31/2020 1147 03/24/20 0550 03/25/20 0115  NA 139 139 135  K 3.8 4.0 4.0  CL 105 107 105  CO2 26 23 22   GLUCOSE 120* 79 122*  BUN 20 24* 29*  CREATININE 1.86* 2.01* 2.69*  CALCIUM 8.5* 7.8* 7.6*  PROT 6.1* 5.0* 5.1*  ALBUMIN 3.1* 2.3* 2.4*  AST 48* 48* 46*  ALT 29 43 38  ALKPHOS 63 54  63  BILITOT 1.4* 1.4* 1.5*  GFRNONAA 25* 23* 16*  GFRAA 29* 26* 18*  ANIONGAP 8 9 8      Hematology Recent Labs  Lab 03/31/2020 1147 03/24/20 0550 03/25/20 0115  WBC 20.7* 22.4* 20.0*  RBC 3.20* 2.52* 2.37*  HGB 10.6* 8.5* 7.9*  HCT 33.8* 26.5* 25.2*  MCV 105.6* 105.2* 106.3*  MCH 33.1 33.7 33.3  MCHC 31.4 32.1 31.3  RDW 14.3 14.6 14.9  PLT 154 119* 114*    BNPNo results for input(s): BNP, PROBNP in the last 168 hours.   DDimer No results for input(s): DDIMER in the last 168 hours.   Radiology    CT ABDOMEN PELVIS WO CONTRAST  Result Date: 03/22/2020 CLINICAL DATA:  Right-sided abdominal pain. EXAM: CT ABDOMEN AND PELVIS WITHOUT CONTRAST TECHNIQUE: Multidetector CT imaging of the abdomen and pelvis was performed following the standard protocol without IV contrast. COMPARISON:  September 23, 2016 FINDINGS: Lower chest: Small bilateral pleural effusions and bibasilar atelectasis. Calcific atherosclerotic disease of the coronary arteries. Hepatobiliary: No focal liver abnormality is seen. Numerous layering gallstones. Indistinctness of the gallbladder wall with pericholecystic fat stranding and small amount of fluid tracking from the gallbladder fossa  to the right flank. Pancreas: Unremarkable. No pancreatic ductal dilatation or surrounding inflammatory changes. Spleen: Normal in size without focal abnormality. Adrenals/Urinary Tract: Adrenal glands are unremarkable. Kidneys are normal, without renal calculi, focal lesion, or hydronephrosis. Bladder is unremarkable. Stomach/Bowel: Mild indistinctness and mucosal wall thickening of the second portions of the duodenum. Normal caliber of the stomach, small bowel and colon. Vascular/Lymphatic: Aortic atherosclerosis. No enlarged abdominal or pelvic lymph nodes. Reproductive: Uterus and bilateral adnexa are unremarkable. Other: No abdominal wall hernia or abnormality. Small amount of free fluid in the anterior pelvis. Musculoskeletal: Extensive  spondylosis of the lumbosacral spine. IMPRESSION: 1. Cholelithiasis with indistinctness of the gallbladder wall, pericholecystic fat stranding and small amount of fluid tracking from the gallbladder fossa to the right flank, and right pericolic gutter. Findings are suspicious for acute cholecystitis. Confirmation with right upper quadrant ultrasound may be considered, given this is a noncontrasted study and the gallbladder wall is poorly evaluated. 2. Mild indistinctness and mucosal wall thickening of the second portions of the duodenum, likely reactive. 3. Small bilateral pleural effusions and bibasilar atelectasis. 4. Calcific atherosclerotic disease of the coronary arteries. Aortic Atherosclerosis (ICD10-I70.0). These results were called by telephone at the time of interpretation on March 24, 2020 at 3:24 pm to provider Dr. Rhunette Croft, who verbally acknowledged these results. Electronically Signed   By: Ted Mcalpine M.D.   On: 03/24/20 15:28   ECHOCARDIOGRAM COMPLETE  Result Date: 03/24/2020    ECHOCARDIOGRAM REPORT   Patient Name:   [name] Date of Exam: 03/24/2020 Medical Rec #:  [id number]         Height:       63.0 in Accession #:    [id number]        Weight:       189.0 lb Date of Birth:  1936-04-17        BSA:          1.888 m Patient Age:    83 years          BP:           99/42 mmHg Patient Gender: F                 HR:           87 bpm. Exam Location:  Inpatient Procedure: 2D Echo, Cardiac Doppler and Color Doppler Indications:    I50.33 Acute on chronic diastolic (congestive) heart failure  History:        Patient has prior history of Echocardiogram examinations, most                 recent 02/07/2014. Cardiomyopathy, CAD; Risk                 Factors:Hypertension, Diabetes, Dyslipidemia and GERD. CKD.  Sonographer:    Elmarie Shiley Dance Referring Phys: [id number] BRIAN S CRENSHAW  Sonographer Comments: No subcostal window. IMPRESSIONS  1. Left ventricular ejection fraction, by estimation, is 60 to 65%.  The left ventricle has normal function. The left ventricle has no regional wall motion abnormalities. Left ventricular diastolic parameters are consistent with Grade II diastolic dysfunction (pseudonormalization). Elevated left atrial pressure.  2. Right ventricular systolic function is normal. The right ventricular size is normal. Tricuspid regurgitation signal is inadequate for assessing PA pressure.  3. Left atrial size was mildly dilated.  4. The mitral valve is normal in structure. Trivial mitral valve regurgitation. No evidence of mitral stenosis.  5. The aortic valve is normal in structure. Aortic valve regurgitation  is not visualized. No aortic stenosis is present.  6. The inferior vena cava is normal in size with greater than 50% respiratory variability, suggesting right atrial pressure of 3 mmHg. Comparison(s): No significant change from prior study. Prior images reviewed side by side. FINDINGS  Left Ventricle: Left ventricular ejection fraction, by estimation, is 60 to 65%. The left ventricle has normal function. The left ventricle has no regional wall motion abnormalities. The left ventricular internal cavity size was normal in size. There is  borderline concentric left ventricular hypertrophy. Left ventricular diastolic parameters are consistent with Grade II diastolic dysfunction (pseudonormalization). Elevated left atrial pressure. Right Ventricle: The right ventricular size is normal. No increase in right ventricular wall thickness. Right ventricular systolic function is normal. Tricuspid regurgitation signal is inadequate for assessing PA pressure. Left Atrium: Left atrial size was mildly dilated. Right Atrium: Right atrial size was normal in size. Pericardium: There is no evidence of pericardial effusion. Mitral Valve: The mitral valve is normal in structure. Normal mobility of the mitral valve leaflets. Moderate mitral annular calcification. Trivial mitral valve regurgitation. No evidence of  mitral valve stenosis. Tricuspid Valve: The tricuspid valve is normal in structure. Tricuspid valve regurgitation is trivial. No evidence of tricuspid stenosis. Aortic Valve: The aortic valve is normal in structure. Aortic valve regurgitation is not visualized. No aortic stenosis is present. Pulmonic Valve: The pulmonic valve was normal in structure. Pulmonic valve regurgitation is not visualized. No evidence of pulmonic stenosis. Aorta: The aortic root is normal in size and structure. Venous: The inferior vena cava is normal in size with greater than 50% respiratory variability, suggesting right atrial pressure of 3 mmHg. IAS/Shunts: No atrial level shunt detected by color flow Doppler.  LEFT VENTRICLE PLAX 2D LVIDd:         4.80 cm  Diastology LVIDs:         3.50 cm  LV e' lateral:   5.11 cm/s LV PW:         1.20 cm  LV E/e' lateral: 15.1 LV IVS:        0.90 cm  LV e' medial:    5.55 cm/s LVOT diam:     1.90 cm  LV E/e' medial:  13.9 LV SV:         54 LV SV Index:   29 LVOT Area:     2.84 cm  RIGHT VENTRICLE RV Basal diam:  2.90 cm RV S prime:     12.50 cm/s TAPSE (M-mode): 2.0 cm LEFT ATRIUM             Index       RIGHT ATRIUM           Index LA diam:        3.40 cm 1.80 cm/m  RA Area:     15.30 cm LA Vol (A2C):   56.4 ml 29.88 ml/m RA Volume:   40.90 ml  21.67 ml/m LA Vol (A4C):   65.3 ml 34.59 ml/m LA Biplane Vol: 60.9 ml 32.26 ml/m  AORTIC VALVE LVOT Vmax:   93.80 cm/s LVOT Vmean:  59.100 cm/s LVOT VTI:    0.191 m  AORTA Ao Root diam: 3.10 cm Ao Asc diam:  3.10 cm MITRAL VALVE MV Area (PHT): 4.06 cm    SHUNTS MV Decel Time: 187 msec    Systemic VTI:  0.19 m MV E velocity: 77.10 cm/s  Systemic Diam: 1.90 cm MV A velocity: 81.90 cm/s MV E/A ratio:  0.94 Mihai Croitoru MD  Electronically signed by Thurmon Fair MD Signature Date/Time: 03/24/2020/11:47:38 AM    Final    US Abdomen Limited RUQ  Result Date: 04/04/2020 CLINICAL DATA:  Abdominal pain. EXAM: ULTRASOUND ABDOMEN LIMITED RIGHT UPPER QUADRANT  COMPARISON:  Abdominal CT earlier today. FINDINGS: Gallbladder: Distended containing multiple intraluminal gallstones and sludge. There is wall thickening at 5-6 mm. Positive sonographic Murphy sign noted by sonographer. Common bile duct: Diameter: 4 mm proximally.  Distal aspect not well visualized. Liver: Technically limited and challenging exam. No focal lesion identified. Within normal limits in parenchymal echogenicity. Portal vein is patent on color Doppler imaging with normal direction of blood flow towards the liver. Other: None. IMPRESSION: 1. Sonographic findings highly suspicious for acute cholecystitis. Gallstones with wall thickening and positive sonographic Murphy sign. 2. No definite biliary dilatation. Distal common bile duct not well visualized due to bowel gas. Electronically Signed   By: Narda Rutherford M.D.   On: 04/15/2020 16:32    Cardiac Studies   Echo 03/24/20 1. Left ventricular ejection fraction, by estimation, is 60 to 65%. The  left ventricle has normal function. The left ventricle has no regional  wall motion abnormalities. Left ventricular diastolic parameters are  consistent with Grade II diastolic  dysfunction (pseudonormalization). Elevated left atrial pressure.  2. Right ventricular systolic function is normal. The right ventricular  size is normal. Tricuspid regurgitation signal is inadequate for assessing  PA pressure.  3. Left atrial size was mildly dilated.  4. The mitral valve is normal in structure. Trivial mitral valve  regurgitation. No evidence of mitral stenosis.  5. The aortic valve is normal in structure. Aortic valve regurgitation is  not visualized. No aortic stenosis is present.  6. The inferior vena cava is normal in size with greater than 50%  respiratory variability, suggesting right atrial pressure of 3 mmHg.   Patient Profile     84 y.o. female withhx of chronic systolic CHF, schizophrenia, CKD stage 3, nonischemic C, HTN, HLD, DM2,  nonobstructive CAD, blindness who is is being seen for pre-op eval prior to cholecystectomy.  Assessment & Plan    Pre-op eval/cholcystitis/SEPSIS - patient has no recent cardiac symptoms.  - She had a cath in 2007 showing 40% prox LAD and 40% 1st diag. EF in 2009 was 30-35% but improved to 55-60% in 2/2-15 echo.  - she is not very functional at baseline using a wheelchair and assistance with ambulation - Echo this admission with EF 60-65%, G2DD, no RWMA, mildly dilated LA - Patient is at moderate risk given functional status and comorbidities. Likely OK to proceed with surgery although tentative plan for drain placement per surgery.   Chronic diastolic CHF - Lasix held while patient is NPO - monitor fluid status - restart lasix on discharge  H/o of Nonischemic CM - EF normal on recent echo  HTN - At baseline pt is on Coreg 6.25mg  BID and lasix 20 mg BID  HLD - resume atorvastatin 20 mg daily at discharge - check lipid panel  For questions or updates, please contact [facility] Please consult [email or web address] for contact info under        Signed, Renelda Kilian David Stall, PA-C  03/25/2020, 8:36 AM

## 2020-03-25 NOTE — Progress Notes (Signed)
[facility] Progress Note     Subjective: CC-  Still having RUQ abdominal pain. WBC slightly down 20, afebrile. Lactic acid normalized 1.7. LFTs about the same. ECHO yesterday showed left ventricular EF 60 to 65%, grade II diastolic dysfunction.   Objective: Vital signs in last 24 hours: Temp:  [98 F (36.7 C)-99.5 F (37.5 C)] 99.5 F (37.5 C) (04/05 0545) Pulse Rate:  [81-89] 89 (04/05 0545) Resp:  [14-24] 19 (04/05 0545) BP: (91-110)/(31-58) 91/40 (04/05 0545) SpO2:  [95 %-98 %] 95 % (04/05 0545) Last BM Date: 01-Apr-2020  Intake/Output from previous day: 04/04 0701 - 04/05 0700 In: 397.5 [I.V.:297.5; IV Piggyback:100] Out: 50 [Urine:50] Intake/Output this shift: No intake/output data recorded.  PE: Gen:  Alert, NAD, pleasant Pulm:  rate and effort normal Abd: Soft, nondistended, TTP RUQ Skin: warm and dry  Lab Results:  Recent Labs    03/24/20 0550 03/25/20 0115  WBC 22.4* 20.0*  HGB 8.5* 7.9*  HCT 26.5* 25.2*  PLT 119* 114*   BMET Recent Labs    03/24/20 0550 03/25/20 0115  NA 139 135  K 4.0 4.0  CL 107 105  CO2 23 22  GLUCOSE 79 122*  BUN 24* 29*  CREATININE 2.01* 2.69*  CALCIUM 7.8* 7.6*   PT/INR Recent Labs    04/10/2020 1532  LABPROT 15.2  INR 1.2   CMP     Component Value Date/Time   NA 135 03/25/2020 0115   K 4.0 03/25/2020 0115   CL 105 03/25/2020 0115   CO2 22 03/25/2020 0115   GLUCOSE 122 (H) 03/25/2020 0115   BUN 29 (H) 03/25/2020 0115   CREATININE 2.69 (H) 03/25/2020 0115   CREATININE 1.48 (H) 03/10/2012 1621   CALCIUM 7.6 (L) 03/25/2020 0115   PROT 5.1 (L) 03/25/2020 0115   ALBUMIN 2.4 (L) 03/25/2020 0115   AST 46 (H) 03/25/2020 0115   ALT 38 03/25/2020 0115   ALKPHOS 63 03/25/2020 0115   BILITOT 1.5 (H) 03/25/2020 0115   GFRNONAA 16 (L) 03/25/2020 0115   GFRAA 18 (L) 03/25/2020 0115   Lipase     Component Value Date/Time   LIPASE 23 03/24/2020 1147       Studies/Results: CT ABDOMEN PELVIS WO  CONTRAST  Result Date: Apr 01, 2020 CLINICAL DATA:  Right-sided abdominal pain. EXAM: CT ABDOMEN AND PELVIS WITHOUT CONTRAST TECHNIQUE: Multidetector CT imaging of the abdomen and pelvis was performed following the standard protocol without IV contrast. COMPARISON:  September 23, 2016 FINDINGS: Lower chest: Small bilateral pleural effusions and bibasilar atelectasis. Calcific atherosclerotic disease of the coronary arteries. Hepatobiliary: No focal liver abnormality is seen. Numerous layering gallstones. Indistinctness of the gallbladder wall with pericholecystic fat stranding and small amount of fluid tracking from the gallbladder fossa to the right flank. Pancreas: Unremarkable. No pancreatic ductal dilatation or surrounding inflammatory changes. Spleen: Normal in size without focal abnormality. Adrenals/Urinary Tract: Adrenal glands are unremarkable. Kidneys are normal, without renal calculi, focal lesion, or hydronephrosis. Bladder is unremarkable. Stomach/Bowel: Mild indistinctness and mucosal wall thickening of the second portions of the duodenum. Normal caliber of the stomach, small bowel and colon. Vascular/Lymphatic: Aortic atherosclerosis. No enlarged abdominal or pelvic lymph nodes. Reproductive: Uterus and bilateral adnexa are unremarkable. Other: No abdominal wall hernia or abnormality. Small amount of free fluid in the anterior pelvis. Musculoskeletal: Extensive spondylosis of the lumbosacral spine. IMPRESSION: 1. Cholelithiasis with indistinctness of the gallbladder wall, pericholecystic fat stranding and small amount of fluid tracking from the gallbladder fossa to the right  flank, and right pericolic gutter. Findings are suspicious for acute cholecystitis. Confirmation with right upper quadrant ultrasound may be considered, given this is a noncontrasted study and the gallbladder wall is poorly evaluated. 2. Mild indistinctness and mucosal wall thickening of the second portions of the duodenum, likely  reactive. 3. Small bilateral pleural effusions and bibasilar atelectasis. 4. Calcific atherosclerotic disease of the coronary arteries. Aortic Atherosclerosis (ICD10-I70.0). These results were called by telephone at the time of interpretation on 2020-04-06 at 3:24 pm to provider Dr. Rhunette Croft, who verbally acknowledged these results. Electronically Signed   By: Ted Mcalpine M.D.   On: April 06, 2020 15:28   ECHOCARDIOGRAM COMPLETE  Result Date: 03/24/2020    ECHOCARDIOGRAM REPORT   Patient Name:   [name] Date of Exam: 03/24/2020 Medical Rec #:  [id number]         Height:       63.0 in Accession #:    [id number]        Weight:       189.0 lb Date of Birth:  February 14, 1936        BSA:          1.888 m Patient Age:    84 years          BP:           99/42 mmHg Patient Gender: F                 HR:           87 bpm. Exam Location:  Inpatient Procedure: 2D Echo, Cardiac Doppler and Color Doppler Indications:    I50.33 Acute on chronic diastolic (congestive) heart failure  History:        Patient has prior history of Echocardiogram examinations, most                 recent 02/07/2014. Cardiomyopathy, CAD; Risk                 Factors:Hypertension, Diabetes, Dyslipidemia and GERD. CKD.  Sonographer:    Elmarie Shiley Dance Referring Phys: [id number] BRIAN S CRENSHAW  Sonographer Comments: No subcostal window. IMPRESSIONS  1. Left ventricular ejection fraction, by estimation, is 60 to 65%. The left ventricle has normal function. The left ventricle has no regional wall motion abnormalities. Left ventricular diastolic parameters are consistent with Grade II diastolic dysfunction (pseudonormalization). Elevated left atrial pressure.  2. Right ventricular systolic function is normal. The right ventricular size is normal. Tricuspid regurgitation signal is inadequate for assessing PA pressure.  3. Left atrial size was mildly dilated.  4. The mitral valve is normal in structure. Trivial mitral valve regurgitation. No evidence of mitral  stenosis.  5. The aortic valve is normal in structure. Aortic valve regurgitation is not visualized. No aortic stenosis is present.  6. The inferior vena cava is normal in size with greater than 50% respiratory variability, suggesting right atrial pressure of 3 mmHg. Comparison(s): No significant change from prior study. Prior images reviewed side by side. FINDINGS  Left Ventricle: Left ventricular ejection fraction, by estimation, is 60 to 65%. The left ventricle has normal function. The left ventricle has no regional wall motion abnormalities. The left ventricular internal cavity size was normal in size. There is  borderline concentric left ventricular hypertrophy. Left ventricular diastolic parameters are consistent with Grade II diastolic dysfunction (pseudonormalization). Elevated left atrial pressure. Right Ventricle: The right ventricular size is normal. No increase in right ventricular wall thickness. Right ventricular systolic function is normal.  Tricuspid regurgitation signal is inadequate for assessing PA pressure. Left Atrium: Left atrial size was mildly dilated. Right Atrium: Right atrial size was normal in size. Pericardium: There is no evidence of pericardial effusion. Mitral Valve: The mitral valve is normal in structure. Normal mobility of the mitral valve leaflets. Moderate mitral annular calcification. Trivial mitral valve regurgitation. No evidence of mitral valve stenosis. Tricuspid Valve: The tricuspid valve is normal in structure. Tricuspid valve regurgitation is trivial. No evidence of tricuspid stenosis. Aortic Valve: The aortic valve is normal in structure. Aortic valve regurgitation is not visualized. No aortic stenosis is present. Pulmonic Valve: The pulmonic valve was normal in structure. Pulmonic valve regurgitation is not visualized. No evidence of pulmonic stenosis. Aorta: The aortic root is normal in size and structure. Venous: The inferior vena cava is normal in size with greater  than 50% respiratory variability, suggesting right atrial pressure of 3 mmHg. IAS/Shunts: No atrial level shunt detected by color flow Doppler.  LEFT VENTRICLE PLAX 2D LVIDd:         4.80 cm  Diastology LVIDs:         3.50 cm  LV e' lateral:   5.11 cm/s LV PW:         1.20 cm  LV E/e' lateral: 15.1 LV IVS:        0.90 cm  LV e' medial:    5.55 cm/s LVOT diam:     1.90 cm  LV E/e' medial:  13.9 LV SV:         54 LV SV Index:   29 LVOT Area:     2.84 cm  RIGHT VENTRICLE RV Basal diam:  2.90 cm RV S prime:     12.50 cm/s TAPSE (M-mode): 2.0 cm LEFT ATRIUM             Index       RIGHT ATRIUM           Index LA diam:        3.40 cm 1.80 cm/m  RA Area:     15.30 cm LA Vol (A2C):   56.4 ml 29.88 ml/m RA Volume:   40.90 ml  21.67 ml/m LA Vol (A4C):   65.3 ml 34.59 ml/m LA Biplane Vol: 60.9 ml 32.26 ml/m  AORTIC VALVE LVOT Vmax:   93.80 cm/s LVOT Vmean:  59.100 cm/s LVOT VTI:    0.191 m  AORTA Ao Root diam: 3.10 cm Ao Asc diam:  3.10 cm MITRAL VALVE MV Area (PHT): 4.06 cm    SHUNTS MV Decel Time: 187 msec    Systemic VTI:  0.19 m MV E velocity: 77.10 cm/s  Systemic Diam: 1.90 cm MV A velocity: 81.90 cm/s MV E/A ratio:  0.94 Mihai Croitoru MD Electronically signed by Thurmon Fair MD Signature Date/Time: 03/24/2020/11:47:38 AM    Final    US Abdomen Limited RUQ  Result Date: 04/19/2020 CLINICAL DATA:  Abdominal pain. EXAM: ULTRASOUND ABDOMEN LIMITED RIGHT UPPER QUADRANT COMPARISON:  Abdominal CT earlier today. FINDINGS: Gallbladder: Distended containing multiple intraluminal gallstones and sludge. There is wall thickening at 5-6 mm. Positive sonographic Murphy sign noted by sonographer. Common bile duct: Diameter: 4 mm proximally.  Distal aspect not well visualized. Liver: Technically limited and challenging exam. No focal lesion identified. Within normal limits in parenchymal echogenicity. Portal vein is patent on color Doppler imaging with normal direction of blood flow towards the liver. Other: None. IMPRESSION:  1. Sonographic findings highly suspicious for acute cholecystitis. Gallstones with wall thickening  and positive sonographic Murphy sign. 2. No definite biliary dilatation. Distal common bile duct not well visualized due to bowel gas. Electronically Signed   By: Narda Rutherford M.D.   On: April 17, 2020 16:32    Anti-infectives: Anti-infectives (From admission, onward)   Start     Dose/Rate Route Frequency Ordered Stop   03/24/20 1600  ceFEPIme (MAXIPIME) 2 g in sodium chloride 0.9 % 100 mL IVPB     2 g 200 mL/hr over 30 Minutes Intravenous Every 24 hours 04-17-20 1659     Apr 17, 2020 1800  metroNIDAZOLE (FLAGYL) IVPB 500 mg     500 mg 100 mL/hr over 60 Minutes Intravenous Every 8 hours 04-17-20 1700     04-17-20 1545  ceFEPIme (MAXIPIME) 2 g in sodium chloride 0.9 % 100 mL IVPB     2 g 200 mL/hr over 30 Minutes Intravenous  Once April 17, 2020 1532 04-17-2020 1712   April 17, 2020 1545  metroNIDAZOLE (FLAGYL) IVPB 500 mg  Status:  Discontinued     500 mg 100 mL/hr over 60 Minutes Intravenous  Once 2020/04/17 1532 April 17, 2020 1659       Assessment/Plan HTN HLD CKD3 Chronic diastolic CHF DNR  Acute cholecystitis  - Await cardiologist final recommendations. Plan laparoscopic cholecystectomy vs perc chole tube tomorrow. Continue IV antibiotics. NPO after midnight.   ID - maxipime/flagyl 4/3>> FEN - IVF, CLD, NPO after MN VTE - lovenox Foley - none Follow up - TBD   LOS: 2 days    Franne Forts, [facility] 03/25/2020, 10:36 AM Please see Amion for pager number during day hours 7:00am-4:30pm

## 2020-03-25 NOTE — Progress Notes (Signed)
Rapid response called for hypotension, decreased urine output concern for sepsis with pt history.   Seen by rapid response RN Britta Mccreedy, NP E. Ouna notified, lactic acid ordered bolus ordered. Will continue to monitor

## 2020-03-25 NOTE — Progress Notes (Addendum)
PROGRESS NOTE    [name]  GBT:[id number] DOB: August 08, 1936 DOA: 04/10/2020 PCP: Jethro Bastos, MD   Brief Narrative:  [name] is a 84 y.o. female with medical history significant of clinically blind, diabetes which is diet controlled, hypertension, dCHF, CKD stage III and remote history of pancreatitis who [facility], presented to the emergency department complaining of RUQ abdominal pain and vomiting for 1 day.  Patient reported after having her meal she developed severe abdominal pain, which was sharp in nature.  And started vomiting.  Later on she developed chills and had diarrhea.  Patient denies chest pain, shortness of breath, dizziness, weakness and cough. In ED: Vitals stable, CT of the abdomen revealed possible acute cholecystitis and cholelithiasis. White count 20 K with left shift, hemoglobin 10.6, creatinine of 1.86, this and total bili minimally elevated.  Renal ultrasound pending.  ER physician called as a code sepsis with sofa score of 2.  Surgical team was consulted which recommended to admit to medicine and treat with antibiotic for now.   Assessment & Plan:   Active Problems:   Acute cholecystitis   Sepsis due to acute cholecystitis, present on admission. General surgery following, pending cardiac clearance may proceed with lap chole versus cholecystostomy -Continue cefepime and Flagyl for coverage of abdomen -Currently very poor p.o. intake -Pending cardiac work-up likely surgical intervention versus percutaneous drainage with IR in the next 24  Acute on CKD stage IIIb  with concurrent oliguria Poor p.o. intake in the setting of above likely exacerbating CKD 3B Baseline appears to be around 1.5 per chart review Very poor p.o. intake, increase fluids, received boluses overnight - patient indicates urine output is increasing appropriately, I's and O's difficult to obtain given patient continues to urinate in the bed Lab Results   Component Value Date   CREATININE 2.69 (H) 03/25/2020   CREATININE 2.01 (H) 03/24/2020   CREATININE 1.86 (H) 04/18/2020   HTN, essential - currently hypotensive -Discontinue carvedilol, Lasix previously on hold given AKI -Follow clinically, blood pressure 90/30 today, patient does have wide pulse pressure which skews her MAP to be on the low side -Patient's chronic blood pressure appears to be around 100/60 per chart review -Continue IV fluids in the setting of poor p.o. intake  Anemia, likely acute hemodilutional anemia on chronic anemia of chronic disease -H/H downtrending somewhat - likely somewhat hemodilutional - continue to monitor CBC Latest Ref Rng & Units 03/25/2020 03/24/2020 03/30/2020  WBC 4.0 - 10.5 K/uL 20.0(H) 22.4(H) 20.7(H)  Hemoglobin 12.0 - 15.0 g/dL 7.9(L) 8.5(L) 10.6(L)  Hematocrit 36.0 - 46.0 % 25.2(L) 26.5(L) 33.8(L)  Platelets 150 - 400 K/uL 114(L) 119(L) 154   Chronic diastolic CHF  Appears euvolemic, hold Lasix given AKI as above  Cardiology following at the request of surgery, echocardiogram as below  DVT prophylaxis: Lovenox   Code Status: DNR  Family Communication: Daughter [name] via phone call.  Disposition Plan: Anticipate discharge to previous home environment pending further evaluation, work-up and likely surgical intervention..  Consults called: Surgery Dr. Luisa Hart Admission status: Inpatient, Medsurg   Consultants:   General Surgery  Cardiology  Procedures:   None  Antimicrobials:  Cefepime, Flagyl   Subjective: No acute issues or events overnight, patient feels quite well, denies any nausea, vomiting, diarrhea, constipation, headache, fevers, chills.  She does indicate mild ongoing right upper quadrant abdominal pain but markedly improved on current medications.  Objective: Vitals:   03/24/20 2334 03/25/20 0137 03/25/20 0412 03/25/20  0545  BP: (!) 95/32 (!) 91/35 (!) 91/34 (!) 91/40  Pulse: 81 86 87 89  Resp: 20 19 19 19   Temp:  98.8 F (37.1 C) 98.6 F (37 C) 98.9 F (37.2 C) 99.5 F (37.5 C)  TempSrc: Oral     SpO2: 95% 96% 97% 95%  Weight:      Height:        Intake/Output Summary (Last 24 hours) at 03/25/2020 0721 Last data filed at 03/25/2020 0100 Gross per 24 hour  Intake 397.49 ml  Output 50 ml  Net 347.49 ml   Filed Weights   04/11/2020 1153  Weight: 85.7 kg    Examination:  General:  Pleasantly resting in bed, No acute distress. HEENT:  Normocephalic atraumatic.  Sclerae nonicteric, noninjected.  Extraocular movements intact bilaterally. Neck:  Without mass or deformity.  Trachea is midline. Lungs:  Clear to auscultate bilaterally without rhonchi, wheeze, or rales. Heart:  Regular rate and rhythm.  Without murmurs, rubs, or gallops. Abdomen:  Soft, moderate tenderness to right upper quadrant Extremities: Without cyanosis, clubbing, edema, or obvious deformity. Vascular:  Dorsalis pedis and posterior tibial pulses palpable bilaterally. Skin:  Warm and dry, no erythema, no ulcerations.   Data Reviewed: I have personally reviewed following labs and imaging studies  CBC: Recent Labs  Lab 04/18/2020 1147 03/24/20 0550 03/25/20 0115  WBC 20.7* 22.4* 20.0*  NEUTROABS 17.7*  --   --   HGB 10.6* 8.5* 7.9*  HCT 33.8* 26.5* 25.2*  MCV 105.6* 105.2* 106.3*  PLT 154 119* 114*   Basic Metabolic Panel: Recent Labs  Lab 04/04/2020 1147 03/24/20 0550 03/25/20 0115  NA 139 139 135  K 3.8 4.0 4.0  CL 105 107 105  CO2 26 23 22   GLUCOSE 120* 79 122*  BUN 20 24* 29*  CREATININE 1.86* 2.01* 2.69*  CALCIUM 8.5* 7.8* 7.6*   GFR: Estimated Creatinine Clearance: 17 mL/min (A) (by C-G formula based on SCr of 2.69 mg/dL (H)). Liver Function Tests: Recent Labs  Lab 03/27/2020 1147 03/24/20 0550 03/25/20 0115  AST 48* 48* 46*  ALT 29 43 38  ALKPHOS 63 54 63  BILITOT 1.4* 1.4* 1.5*  PROT 6.1* 5.0* 5.1*  ALBUMIN 3.1* 2.3* 2.4*   Recent Labs  Lab 04/07/2020 1147  LIPASE 23   No results for  input(s): AMMONIA in the last 168 hours. Coagulation Profile: Recent Labs  Lab 04/08/2020 1532  INR 1.2   Cardiac Enzymes: No results for input(s): CKTOTAL, CKMB, CKMBINDEX, TROPONINI in the last 168 hours. BNP (last 3 results) No results for input(s): PROBNP in the last 8760 hours. HbA1C: No results for input(s): HGBA1C in the last 72 hours. CBG: Recent Labs  Lab 03/24/20 2139 03/25/20 0600  GLUCAP 142* 94   Lipid Profile: No results for input(s): CHOL, HDL, LDLCALC, TRIG, CHOLHDL, LDLDIRECT in the last 72 hours. Thyroid Function Tests: No results for input(s): TSH, T4TOTAL, FREET4, T3FREE, THYROIDAB in the last 72 hours. Anemia Panel: No results for input(s): VITAMINB12, FOLATE, FERRITIN, TIBC, IRON, RETICCTPCT in the last 72 hours. Sepsis Labs: Recent Labs  Lab 04/05/2020 1812 04/08/2020 2303 03/24/20 2141 03/25/20 0115  LATICACIDVEN 2.4* 1.5 2.9* 1.7    Recent Results (from the past 240 hour(s))  Blood Culture (routine x 2)     Status: None (Preliminary result)   Collection Time: 04/19/2020  3:32 PM   Specimen: BLOOD  Result Value Ref Range Status   Specimen Description   Final    BLOOD LEFT  ANTECUBITAL Performed at [facility], [place] [address], [place], [place] [address]    Special Requests   Final    BOTTLES DRAWN AEROBIC AND ANAEROBIC Blood Culture results may not be optimal due to an inadequate volume of blood received in culture bottles Performed at [facility] [address] [place], [place] [address]    Culture   Final    NO GROWTH < 12 HOURS Performed at [facility] [address] [place], [place] [address]    Report Status PENDING  Incomplete  Respiratory Panel by RT PCR (Flu A&B, Covid) - Nasopharyngeal Swab     Status: None   Collection Time: 04-14-2020  3:35 PM   Specimen: Nasopharyngeal Swab  Result Value Ref Range Status   SARS Coronavirus 2 by RT PCR NEGATIVE NEGATIVE Final    Comment:  (NOTE) SARS-CoV-2 target nucleic acids are NOT DETECTED. The SARS-CoV-2 RNA is generally detectable in upper respiratoy specimens during the acute phase of infection. The lowest concentration of SARS-CoV-2 viral copies this assay can detect is 131 copies/mL. A negative result does not preclude SARS-Cov-2 infection and should not be used as the sole basis for treatment or other patient management decisions. A negative result may occur with  improper specimen collection/handling, submission of specimen other than nasopharyngeal swab, presence of viral mutation(s) within the areas targeted by this assay, and inadequate number of viral copies (<131 copies/mL). A negative result must be combined with clinical observations, patient history, and epidemiological information. The expected result is Negative. Fact Sheet for Patients:  PinkCheek.be Fact Sheet for Healthcare Providers:  GravelBags.it This test is not yet ap proved or cleared by the [place] FDA and  has been authorized for detection and/or diagnosis of SARS-CoV-2 by FDA under an Emergency Use Authorization (EUA). This EUA will remain  in effect (meaning this test can be used) for the duration of the COVID-19 declaration under Section 564(b)(1) of the Act, 21 U.S.C. section 360bbb-3(b)(1), unless the authorization is terminated or revoked sooner.    Influenza A by PCR NEGATIVE NEGATIVE Final   Influenza B by PCR NEGATIVE NEGATIVE Final    Comment: (NOTE) The Xpert Xpress SARS-CoV-2/FLU/RSV assay is intended as an aid in  the diagnosis of influenza from Nasopharyngeal swab specimens and  should not be used as a sole basis for treatment. Nasal washings and  aspirates are unacceptable for Xpert Xpress SARS-CoV-2/FLU/RSV  testing. Fact Sheet for Patients: PinkCheek.be Fact Sheet for Healthcare  Providers: GravelBags.it This test is not yet approved or cleared by the [place] FDA and  has been authorized for detection and/or diagnosis of SARS-CoV-2 by  FDA under an Emergency Use Authorization (EUA). This EUA will remain  in effect (meaning this test can be used) for the duration of the  Covid-19 declaration under Section 564(b)(1) of the Act, 21  U.S.C. section 360bbb-3(b)(1), unless the authorization is  terminated or revoked. Performed at [facility], [place] [address], [place], [place] [address]   Blood Culture (routine x 2)     Status: None (Preliminary result)   Collection Time: 2020-04-14  3:37 PM   Specimen: BLOOD RIGHT HAND  Result Value Ref Range Status   Specimen Description   Final    BLOOD RIGHT HAND Performed at [facility] [address], [place], [place] [address]    Special Requests   Final    BOTTLES DRAWN AEROBIC AND ANAEROBIC Blood Culture adequate volume Performed  at [facility], [address], [place], [place] [address]    Culture   Final    NO GROWTH < 12 HOURS Performed at [facility], [address] [place], [place] [address]    Report Status PENDING  Incomplete         Radiology Studies: CT ABDOMEN PELVIS WO CONTRAST  Result Date: 03/22/2020 CLINICAL DATA:  Right-sided abdominal pain. EXAM: CT ABDOMEN AND PELVIS WITHOUT CONTRAST TECHNIQUE: Multidetector CT imaging of the abdomen and pelvis was performed following the standard protocol without IV contrast. COMPARISON:  September 23, 2016 FINDINGS: Lower chest: Small bilateral pleural effusions and bibasilar atelectasis. Calcific atherosclerotic disease of the coronary arteries. Hepatobiliary: No focal liver abnormality is seen. Numerous layering gallstones. Indistinctness of the gallbladder wall with pericholecystic fat stranding and small amount of fluid tracking from the gallbladder fossa to the right  flank. Pancreas: Unremarkable. No pancreatic ductal dilatation or surrounding inflammatory changes. Spleen: Normal in size without focal abnormality. Adrenals/Urinary Tract: Adrenal glands are unremarkable. Kidneys are normal, without renal calculi, focal lesion, or hydronephrosis. Bladder is unremarkable. Stomach/Bowel: Mild indistinctness and mucosal wall thickening of the second portions of the duodenum. Normal caliber of the stomach, small bowel and colon. Vascular/Lymphatic: Aortic atherosclerosis. No enlarged abdominal or pelvic lymph nodes. Reproductive: Uterus and bilateral adnexa are unremarkable. Other: No abdominal wall hernia or abnormality. Small amount of free fluid in the anterior pelvis. Musculoskeletal: Extensive spondylosis of the lumbosacral spine. IMPRESSION: 1. Cholelithiasis with indistinctness of the gallbladder wall, pericholecystic fat stranding and small amount of fluid tracking from the gallbladder fossa to the right flank, and right pericolic gutter. Findings are suspicious for acute cholecystitis. Confirmation with right upper quadrant ultrasound may be considered, given this is a noncontrasted study and the gallbladder wall is poorly evaluated. 2. Mild indistinctness and mucosal wall thickening of the second portions of the duodenum, likely reactive. 3. Small bilateral pleural effusions and bibasilar atelectasis. 4. Calcific atherosclerotic disease of the coronary arteries. Aortic Atherosclerosis (ICD10-I70.0). These results were called by telephone at the time of interpretation on 03/29/2020 at 3:24 pm to provider Dr. Rhunette Croft, who verbally acknowledged these results. Electronically Signed   By: Ted Mcalpine M.D.   On: 04/19/2020 15:28   ECHOCARDIOGRAM COMPLETE  Result Date: 03/24/2020    ECHOCARDIOGRAM REPORT   Patient Name:   ANASTASIA TOMPSON Date of Exam: 03/24/2020 Medical Rec #:  [id number]         Height:       63.0 in Accession #:    [id number]        Weight:       189.0  lb Date of Birth:  October 16, 1936        BSA:          1.888 m Patient Age:    83 years          BP:           99/42 mmHg Patient Gender: F                 HR:           87 bpm. Exam Location:  Inpatient Procedure: 2D Echo, Cardiac Doppler and Color Doppler Indications:    I50.33 Acute on chronic diastolic (congestive) heart failure  History:        Patient has prior history of Echocardiogram examinations, most                 recent 02/07/2014.  Cardiomyopathy, CAD; Risk                 Factors:Hypertension, Diabetes, Dyslipidemia and GERD. CKD.  Sonographer:    Elmarie Shiley Dance Referring Phys: 87 BRIAN S CRENSHAW  Sonographer Comments: No subcostal window. IMPRESSIONS  1. Left ventricular ejection fraction, by estimation, is 60 to 65%. The left ventricle has normal function. The left ventricle has no regional wall motion abnormalities. Left ventricular diastolic parameters are consistent with Grade II diastolic dysfunction (pseudonormalization). Elevated left atrial pressure.  2. Right ventricular systolic function is normal. The right ventricular size is normal. Tricuspid regurgitation signal is inadequate for assessing PA pressure.  3. Left atrial size was mildly dilated.  4. The mitral valve is normal in structure. Trivial mitral valve regurgitation. No evidence of mitral stenosis.  5. The aortic valve is normal in structure. Aortic valve regurgitation is not visualized. No aortic stenosis is present.  6. The inferior vena cava is normal in size with greater than 50% respiratory variability, suggesting right atrial pressure of 3 mmHg. Comparison(s): No significant change from prior study. Prior images reviewed side by side. FINDINGS  Left Ventricle: Left ventricular ejection fraction, by estimation, is 60 to 65%. The left ventricle has normal function. The left ventricle has no regional wall motion abnormalities. The left ventricular internal cavity size was normal in size. There is  borderline concentric left  ventricular hypertrophy. Left ventricular diastolic parameters are consistent with Grade II diastolic dysfunction (pseudonormalization). Elevated left atrial pressure. Right Ventricle: The right ventricular size is normal. No increase in right ventricular wall thickness. Right ventricular systolic function is normal. Tricuspid regurgitation signal is inadequate for assessing PA pressure. Left Atrium: Left atrial size was mildly dilated. Right Atrium: Right atrial size was normal in size. Pericardium: There is no evidence of pericardial effusion. Mitral Valve: The mitral valve is normal in structure. Normal mobility of the mitral valve leaflets. Moderate mitral annular calcification. Trivial mitral valve regurgitation. No evidence of mitral valve stenosis. Tricuspid Valve: The tricuspid valve is normal in structure. Tricuspid valve regurgitation is trivial. No evidence of tricuspid stenosis. Aortic Valve: The aortic valve is normal in structure. Aortic valve regurgitation is not visualized. No aortic stenosis is present. Pulmonic Valve: The pulmonic valve was normal in structure. Pulmonic valve regurgitation is not visualized. No evidence of pulmonic stenosis. Aorta: The aortic root is normal in size and structure. Venous: The inferior vena cava is normal in size with greater than 50% respiratory variability, suggesting right atrial pressure of 3 mmHg. IAS/Shunts: No atrial level shunt detected by color flow Doppler.  LEFT VENTRICLE PLAX 2D LVIDd:         4.80 cm  Diastology LVIDs:         3.50 cm  LV e' lateral:   5.11 cm/s LV PW:         1.20 cm  LV E/e' lateral: 15.1 LV IVS:        0.90 cm  LV e' medial:    5.55 cm/s LVOT diam:     1.90 cm  LV E/e' medial:  13.9 LV SV:         54 LV SV Index:   29 LVOT Area:     2.84 cm  RIGHT VENTRICLE RV Basal diam:  2.90 cm RV S prime:     12.50 cm/s TAPSE (M-mode): 2.0 cm LEFT ATRIUM             Index       RIGHT  ATRIUM           Index LA diam:        3.40 cm 1.80 cm/m  RA  Area:     15.30 cm LA Vol (A2C):   56.4 ml 29.88 ml/m RA Volume:   40.90 ml  21.67 ml/m LA Vol (A4C):   65.3 ml 34.59 ml/m LA Biplane Vol: 60.9 ml 32.26 ml/m  AORTIC VALVE LVOT Vmax:   93.80 cm/s LVOT Vmean:  59.100 cm/s LVOT VTI:    0.191 m  AORTA Ao Root diam: 3.10 cm Ao Asc diam:  3.10 cm MITRAL VALVE MV Area (PHT): 4.06 cm    SHUNTS MV Decel Time: 187 msec    Systemic VTI:  0.19 m MV E velocity: 77.10 cm/s  Systemic Diam: 1.90 cm MV A velocity: 81.90 cm/s MV E/A ratio:  0.94 Mihai Croitoru MD Electronically signed by Thurmon Fair MD Signature Date/Time: 03/24/2020/11:47:38 AM    Final    US Abdomen Limited RUQ  Result Date: 03/30/2020 CLINICAL DATA:  Abdominal pain. EXAM: ULTRASOUND ABDOMEN LIMITED RIGHT UPPER QUADRANT COMPARISON:  Abdominal CT earlier today. FINDINGS: Gallbladder: Distended containing multiple intraluminal gallstones and sludge. There is wall thickening at 5-6 mm. Positive sonographic Murphy sign noted by sonographer. Common bile duct: Diameter: 4 mm proximally.  Distal aspect not well visualized. Liver: Technically limited and challenging exam. No focal lesion identified. Within normal limits in parenchymal echogenicity. Portal vein is patent on color Doppler imaging with normal direction of blood flow towards the liver. Other: None. IMPRESSION: 1. Sonographic findings highly suspicious for acute cholecystitis. Gallstones with wall thickening and positive sonographic Murphy sign. 2. No definite biliary dilatation. Distal common bile duct not well visualized due to bowel gas. Electronically Signed   By: Narda Rutherford M.D.   On: 04/12/2020 16:32        Scheduled Meds: . enoxaparin (LOVENOX) injection  30 mg Subcutaneous Q24H   Continuous Infusions: . ceFEPime (MAXIPIME) IV 2 g (03/24/20 1700)  . metronidazole 500 mg (03/25/20 0200)     LOS: 2 days    Time spent:  Azucena Fallen, DO [facility]  If 7PM-7AM, please contact  night-coverage [email or web address]  03/25/2020, 7:21 AM

## 2020-03-25 NOTE — Progress Notes (Addendum)
   Vital Signs MEWS/VS Documentation      03/25/2020 1102 03/25/2020 1353 03/25/2020 1632 03/25/2020 1638   MEWS Score:  1  0  2  2   MEWS Score Color:  Green  Green  Yellow  Yellow   Resp:  --  20  (!) 24  (!) 23   Pulse:  --  92  92  91   BP:  --  (!) 150/112  (!) 95/32  (!) 82/28   Temp:  --  98.8 F (37.1 C)  100 F (37.8 C)  99.4 F (37.4 C)   O2 Device:  --  --  --  Room Air   Level of Consciousness:  Alert  --  --  --      After consulting w/ Annabelle Harman, RN I initiated the yellow mews protocol and notified Dr. Natale Milch by [facility] page.   Dr. Natale Milch stated that patient has chronically low blood pressure and as long as she is asymptomatic it is ok to monitor patient for now.   Patient is not tachycardic and is sleeping at this time. She will wake up to answer questions. She is A&O x4.   Dr. Natale Milch wants to be notified if patient becomes symptomatic.   Notified Christena Deem, RN.      [name] 03/25/2020,4:45 PM

## 2020-03-26 ENCOUNTER — Inpatient Hospital Stay (HOSPITAL_COMMUNITY): Payer: Medicare (Managed Care)

## 2020-03-26 ENCOUNTER — Encounter (HOSPITAL_COMMUNITY): Payer: Self-pay | Admitting: Anesthesiology

## 2020-03-26 ENCOUNTER — Encounter (HOSPITAL_COMMUNITY): Admission: EM | Disposition: E | Payer: Self-pay | Source: Home / Self Care | Attending: Internal Medicine

## 2020-03-26 DIAGNOSIS — N189 Chronic kidney disease, unspecified: Secondary | ICD-10-CM

## 2020-03-26 DIAGNOSIS — K8000 Calculus of gallbladder with acute cholecystitis without obstruction: Secondary | ICD-10-CM

## 2020-03-26 DIAGNOSIS — K810 Acute cholecystitis: Secondary | ICD-10-CM | POA: Diagnosis not present

## 2020-03-26 DIAGNOSIS — N179 Acute kidney failure, unspecified: Secondary | ICD-10-CM

## 2020-03-26 HISTORY — PX: IR PERC CHOLECYSTOSTOMY: IMG2326

## 2020-03-26 LAB — CBC
HCT: 24.8 % — ABNORMAL LOW (ref 36.0–46.0)
Hemoglobin: 8 g/dL — ABNORMAL LOW (ref 12.0–15.0)
MCH: 33.9 pg (ref 26.0–34.0)
MCHC: 32.3 g/dL (ref 30.0–36.0)
MCV: 105.1 fL — ABNORMAL HIGH (ref 80.0–100.0)
Platelets: 106 10*3/uL — ABNORMAL LOW (ref 150–400)
RBC: 2.36 MIL/uL — ABNORMAL LOW (ref 3.87–5.11)
RDW: 15 % (ref 11.5–15.5)
WBC: 12.5 10*3/uL — ABNORMAL HIGH (ref 4.0–10.5)
nRBC: 0 % (ref 0.0–0.2)

## 2020-03-26 LAB — COMPREHENSIVE METABOLIC PANEL
ALT: 27 U/L (ref 0–44)
AST: 46 U/L — ABNORMAL HIGH (ref 15–41)
Albumin: 2.5 g/dL — ABNORMAL LOW (ref 3.5–5.0)
Alkaline Phosphatase: 78 U/L (ref 38–126)
Anion gap: 8 (ref 5–15)
BUN: 37 mg/dL — ABNORMAL HIGH (ref 8–23)
CO2: 20 mmol/L — ABNORMAL LOW (ref 22–32)
Calcium: 7.3 mg/dL — ABNORMAL LOW (ref 8.9–10.3)
Chloride: 109 mmol/L (ref 98–111)
Creatinine, Ser: 3.94 mg/dL — ABNORMAL HIGH (ref 0.44–1.00)
GFR calc Af Amer: 12 mL/min — ABNORMAL LOW (ref >60)
GFR calc non Af Amer: 10 mL/min — ABNORMAL LOW (ref >60)
Glucose, Bld: 78 mg/dL (ref 70–99)
Potassium: 4 mmol/L (ref 3.5–5.1)
Sodium: 137 mmol/L (ref 135–145)
Total Bilirubin: 1.4 mg/dL — ABNORMAL HIGH (ref 0.3–1.2)
Total Protein: 5.1 g/dL — ABNORMAL LOW (ref 6.5–8.1)

## 2020-03-26 LAB — BASIC METABOLIC PANEL
Anion gap: 11 (ref 5–15)
BUN: 39 mg/dL — ABNORMAL HIGH (ref 8–23)
CO2: 19 mmol/L — ABNORMAL LOW (ref 22–32)
Calcium: 7.2 mg/dL — ABNORMAL LOW (ref 8.9–10.3)
Chloride: 106 mmol/L (ref 98–111)
Creatinine, Ser: 4.06 mg/dL — ABNORMAL HIGH (ref 0.44–1.00)
GFR calc Af Amer: 11 mL/min — ABNORMAL LOW (ref >60)
GFR calc non Af Amer: 10 mL/min — ABNORMAL LOW (ref >60)
Glucose, Bld: 73 mg/dL (ref 70–99)
Potassium: 4.7 mmol/L (ref 3.5–5.1)
Sodium: 136 mmol/L (ref 135–145)

## 2020-03-26 LAB — SURGICAL PCR SCREEN
MRSA, PCR: NEGATIVE
Staphylococcus aureus: NEGATIVE

## 2020-03-26 SURGERY — LAPAROSCOPIC CHOLECYSTECTOMY WITH INTRAOPERATIVE CHOLANGIOGRAM
Anesthesia: General

## 2020-03-26 MED ORDER — IOHEXOL 300 MG/ML  SOLN
50.0000 mL | Freq: Once | INTRAMUSCULAR | Status: AC | PRN
  Administered 2020-03-26: 10 mL

## 2020-03-26 MED ORDER — SODIUM CHLORIDE 0.9 % IV SOLN
1.0000 g | INTRAVENOUS | Status: DC
  Administered 2020-03-26 – 2020-03-31 (×6): 1 g via INTRAVENOUS
  Filled 2020-03-26 (×10): qty 1

## 2020-03-26 MED ORDER — SODIUM CHLORIDE 0.9 % IV BOLUS
500.0000 mL | Freq: Once | INTRAVENOUS | Status: AC
  Administered 2020-03-26: 500 mL via INTRAVENOUS

## 2020-03-26 MED ORDER — SODIUM CHLORIDE 0.9 % IV SOLN: INTRAVENOUS | Status: DC

## 2020-03-26 MED ORDER — FENTANYL CITRATE (PF) 100 MCG/2ML IJ SOLN
INTRAMUSCULAR | Status: AC
  Filled 2020-03-26: qty 2

## 2020-03-26 MED ORDER — LIDOCAINE HCL 1 % IJ SOLN
INTRAMUSCULAR | Status: AC | PRN
  Administered 2020-03-26: 10 mL via INTRADERMAL

## 2020-03-26 MED ORDER — MIDAZOLAM HCL 2 MG/2ML IJ SOLN
INTRAMUSCULAR | Status: AC
  Filled 2020-03-26: qty 2

## 2020-03-26 MED ORDER — MIDAZOLAM HCL 2 MG/2ML IJ SOLN
INTRAMUSCULAR | Status: AC | PRN
  Administered 2020-03-26 (×3): 0.5 mg via INTRAVENOUS

## 2020-03-26 MED ORDER — CHLORHEXIDINE GLUCONATE CLOTH 2 % EX PADS
6.0000 | MEDICATED_PAD | Freq: Every day | CUTANEOUS | Status: DC
  Administered 2020-03-26 – 2020-04-01 (×7): 6 via TOPICAL

## 2020-03-26 MED ORDER — CIPROFLOXACIN IN D5W 400 MG/200ML IV SOLN
INTRAVENOUS | Status: AC
  Administered 2020-03-26: 400 mg
  Filled 2020-03-26: qty 200

## 2020-03-26 MED ORDER — LIDOCAINE HCL 1 % IJ SOLN
INTRAMUSCULAR | Status: AC
  Filled 2020-03-26: qty 20

## 2020-03-26 MED ORDER — FENTANYL CITRATE (PF) 100 MCG/2ML IJ SOLN
INTRAMUSCULAR | Status: AC | PRN
  Administered 2020-03-26: 50 ug via INTRAVENOUS

## 2020-03-26 NOTE — Progress Notes (Signed)
Patient returns to room 1505 from IR at this time.

## 2020-03-26 NOTE — Progress Notes (Signed)
Patient in yellow mews at change of shift.

## 2020-03-26 NOTE — Progress Notes (Signed)
Day of Surgery    CC: Right upper quadrant abdominal pain  Subjective: Pt lying in bed, won't open her eyes, says she feels bad, but can't say where.  Her BP has been down to the 70's and she got a fluid bolus.  I am having them do orthostatics because she has historically low BP's and no one was sure the low BP was a concern.  Her Creatinine has also doubled since admit.  I am talking with Dr. Natale Milch.  He is going to recheck her numbers.  She says she is not very active at home.    Objective: Vital signs in last 24 hours: Temp:  [98.5 F (36.9 C)-100 F (37.8 C)] 98.5 F (36.9 C) (04/06 0833) Pulse Rate:  [82-98] 82 (04/06 0837) Resp:  [14-24] 21 (04/06 0833) BP: (73-150)/(28-112) 73/32 (04/06 0837) SpO2:  [94 %-99 %] 97 % (04/06 0837) Last BM Date: 04/05/2020 50 p.o. recorded 997 IV 700 urine Afebrile blood pressures remains low; 73/32 at 8 AM. Creatinine 1.86>> 2.01>> 2.69>> 3.94 total bilirubin 1.4>>1.4>> 1.5>> 1.4 WBC 20.7>> 22.4>> 20.0>> 12.5 Intake/Output from previous day: 04/05 0701 - 04/06 0700 In: 1747.1 [P.O.:50; I.V.:997.1; IV Piggyback:700] Out: -  Intake/Output this shift: No intake/output data recorded.  General appearance: she seems tired, won't open her eyes, says she feels bad but not sure just what is bothering her.    Resp: clear to auscultation bilaterally and not moving alot of air, but no wheezing GI: soft, tender all over, but more in the RUQ.    Lab Results:  Recent Labs    03/25/20 0115 04/07/20 0555  WBC 20.0* 12.5*  HGB 7.9* 8.0*  HCT 25.2* 24.8*  PLT 114* 106*    BMET Recent Labs    03/25/20 0115 April 07, 2020 0555  NA 135 137  K 4.0 4.0  CL 105 109  CO2 22 20*  GLUCOSE 122* 78  BUN 29* 37*  CREATININE 2.69* 3.94*  CALCIUM 7.6* 7.3*   PT/INR Recent Labs    04/11/2020 1532  LABPROT 15.2  INR 1.2    Recent Labs  Lab 04/02/2020 1147 03/24/20 0550 03/25/20 0115 April 07, 2020 0555  AST 48* 48* 46* 46*  ALT 29 43 38 27  ALKPHOS  63 54 63 78  BILITOT 1.4* 1.4* 1.5* 1.4*  PROT 6.1* 5.0* 5.1* 5.1*  ALBUMIN 3.1* 2.3* 2.4* 2.5*     Lipase     Component Value Date/Time   LIPASE 23 04/17/2020 1147     Medications: . enoxaparin (LOVENOX) injection  30 mg Subcutaneous Q24H    Assessment/Plan HTN HLD CKD3 Chronic diastolic CHF  -Echo shows EF 60 to 65% no significant valvular abnormality, patient is    moderate risk but not prohibitive for cholecystectomy DNR  Acute cholecystitis  -  ID - maxipime/flagyl 4/3>> FEN - IVF, CLD, NPO after MN VTE - lovenox Foley - none Follow up - TBD   Plan:  Will review and await new labs, Dr. Natale Milch is seeing now.  Currently I do not think taking her to the OR is the best option.  If Bp improves, urine output is OK and creatinine is not as high as this AM labs show we can reconsider later.  I would keep her NPO till Dr. Donell Beers see's her.        LOS: 3 days    Constantina Laseter 04/07/20 Please see Amion

## 2020-03-26 NOTE — Progress Notes (Signed)
PHARMACY NOTE:  ANTIMICROBIAL RENAL DOSAGE ADJUSTMENT  Current antimicrobial regimen includes a mismatch between antimicrobial dosage and estimated renal function.  As per policy approved by the Pharmacy & Therapeutics and Medical Executive Committees, the antimicrobial dosage will be adjusted accordingly.  Current antimicrobial dosage:  Cefepime 2gm q24  Indication: Cholecystitis  Renal Function:   Estimated Creatinine Clearance: 11.6 mL/min (A) (by C-G formula based on SCr of 3.94 mg/dL (H)). []      On intermittent HD, scheduled: []      On CRRT    Antimicrobial dosage has been changed to:  Cefepime 1gm q24   Additional Comments: for lap cholecystectomy today 4/6, abx may be discontinued. But SCr increasing, therefore reduced Cefepime dose   Thank you for allowing pharmacy to be a part of this patient's care.  PharmD 03/28/2020 11:32 AM

## 2020-03-26 NOTE — Care Management Important Message (Signed)
Important Message  Patient Details IM Letter given to Daryel Gerald SW Case Manager to present to the Patient Name: MELITA VILLALONA MRN: [id number] Date of Birth: 11-Dec-1936   Medicare Important Message Given:  Yes     Caren Macadam 04-17-2020, 10:38 AM

## 2020-03-26 NOTE — Progress Notes (Signed)
Progress Note  Patient Name: [name] Date of Encounter: 03/28/2020  Primary Cardiologist: Egley Rouge, MD   Subjective   Tentative plan for lap chole today. Pressure soft overnight and IVF bolus given.   Patient is more lethargic on exam. A&O x1. Denies chest pain. Abdominal pain mildly worse today.   Inpatient Medications    Scheduled Meds: . enoxaparin (LOVENOX) injection  30 mg Subcutaneous Q24H   Continuous Infusions: . sodium chloride 75 mL/hr at 04/04/2020 0219  . ceFEPime (MAXIPIME) IV 2 g (03/25/20 1517)  . metronidazole 500 mg (03/31/2020 0231)   PRN Meds: HYDROcodone-acetaminophen, ondansetron **OR** ondansetron (ZOFRAN) IV   Vital Signs    Vitals:   03/25/20 2046 03/24/2020 0020 03/21/2020 0451 03/28/2020 0635  BP: (!) 106/50 (!) 86/38 (!) 85/38 (!) 75/35  Pulse: 98 95 92 90  Resp: 14 18 18 20   Temp: 99.4 F (37.4 C) 99.1 F (37.3 C) 99.6 F (37.6 C) 99.2 F (37.3 C)  TempSrc: Oral Oral Oral Oral  SpO2: 97% 95% 94% 95%  Weight:      Height:        Intake/Output Summary (Last 24 hours) at 04/15/2020 0827 Last data filed at 03/25/2020 0600 Gross per 24 hour  Intake 1747.05 ml  Output -  Net 1747.05 ml   Last 3 Weights 03/30/2020 10/01/2016 09/30/2016  Weight (lbs) 189 lb 220 lb 0.3 oz 215 lb 13.3 oz  Weight (kg) 85.73 kg 99.8 kg 97.9 kg      Telemetry    N/A - Personally Reviewed  ECG    No new - Personally Reviewed  Physical Exam   GEN: No acute distress.; lethargic Neck: No JVD Cardiac: RRR, no murmurs, rubs, or gallops.  Respiratory: Clear to auscultation bilaterally. GI: Soft, mildly tender, non-distended  MS: No edema; No deformity. Neuro:  A&O x 1 Psych: Normal affect   Labs    High Sensitivity Troponin:  No results for input(s): TROPONINIHS in the last 720 hours.    Chemistry Recent Labs  Lab 03/24/20 0550 03/25/20 0115 04/06/2020 0555  NA 139 135 137  K 4.0 4.0 4.0  CL 107 105 109  CO2 23 22 20*  GLUCOSE 79 122* 78   BUN 24* 29* 37*  CREATININE 2.01* 2.69* 3.94*  CALCIUM 7.8* 7.6* 7.3*  PROT 5.0* 5.1* 5.1*  ALBUMIN 2.3* 2.4* 2.5*  AST 48* 46* 46*  ALT 43 38 27  ALKPHOS 54 63 78  BILITOT 1.4* 1.5* 1.4*  GFRNONAA 23* 16* 10*  GFRAA 26* 18* 12*  ANIONGAP 9 8 8      Hematology Recent Labs  Lab 03/24/20 0550 03/25/20 0115 04/12/2020 0555  WBC 22.4* 20.0* 12.5*  RBC 2.52* 2.37* 2.36*  HGB 8.5* 7.9* 8.0*  HCT 26.5* 25.2* 24.8*  MCV 105.2* 106.3* 105.1*  MCH 33.7 33.3 33.9  MCHC 32.1 31.3 32.3  RDW 14.6 14.9 15.0  PLT 119* 114* 106*    BNPNo results for input(s): BNP, PROBNP in the last 168 hours.   DDimer No results for input(s): DDIMER in the last 168 hours.   Radiology    ECHOCARDIOGRAM COMPLETE  Result Date: 03/24/2020    ECHOCARDIOGRAM REPORT   Patient Name:   [name] Date of Exam: 03/24/2020 Medical Rec #:  [id number]         Height:       63.0 in Accession #:    [id number]        Weight:  189.0 lb Date of Birth:  05-07-1936        BSA:          1.888 m Patient Age:    83 years          BP:           99/42 mmHg Patient Gender: F                 HR:           87 bpm. Exam Location:  Inpatient Procedure: 2D Echo, Cardiac Doppler and Color Doppler Indications:    I50.33 Acute on chronic diastolic (congestive) heart failure  History:        Patient has prior history of Echocardiogram examinations, most                 recent 02/07/2014. Cardiomyopathy, CAD; Risk                 Factors:Hypertension, Diabetes, Dyslipidemia and GERD. CKD.  Sonographer:    Elmarie Shiley Dance Referring Phys: [id number] BRIAN S CRENSHAW  Sonographer Comments: No subcostal window. IMPRESSIONS  1. Left ventricular ejection fraction, by estimation, is 60 to 65%. The left ventricle has normal function. The left ventricle has no regional wall motion abnormalities. Left ventricular diastolic parameters are consistent with Grade II diastolic dysfunction (pseudonormalization). Elevated left atrial pressure.  2. Right  ventricular systolic function is normal. The right ventricular size is normal. Tricuspid regurgitation signal is inadequate for assessing PA pressure.  3. Left atrial size was mildly dilated.  4. The mitral valve is normal in structure. Trivial mitral valve regurgitation. No evidence of mitral stenosis.  5. The aortic valve is normal in structure. Aortic valve regurgitation is not visualized. No aortic stenosis is present.  6. The inferior vena cava is normal in size with greater than 50% respiratory variability, suggesting right atrial pressure of 3 mmHg. Comparison(s): No significant change from prior study. Prior images reviewed side by side. FINDINGS  Left Ventricle: Left ventricular ejection fraction, by estimation, is 60 to 65%. The left ventricle has normal function. The left ventricle has no regional wall motion abnormalities. The left ventricular internal cavity size was normal in size. There is  borderline concentric left ventricular hypertrophy. Left ventricular diastolic parameters are consistent with Grade II diastolic dysfunction (pseudonormalization). Elevated left atrial pressure. Right Ventricle: The right ventricular size is normal. No increase in right ventricular wall thickness. Right ventricular systolic function is normal. Tricuspid regurgitation signal is inadequate for assessing PA pressure. Left Atrium: Left atrial size was mildly dilated. Right Atrium: Right atrial size was normal in size. Pericardium: There is no evidence of pericardial effusion. Mitral Valve: The mitral valve is normal in structure. Normal mobility of the mitral valve leaflets. Moderate mitral annular calcification. Trivial mitral valve regurgitation. No evidence of mitral valve stenosis. Tricuspid Valve: The tricuspid valve is normal in structure. Tricuspid valve regurgitation is trivial. No evidence of tricuspid stenosis. Aortic Valve: The aortic valve is normal in structure. Aortic valve regurgitation is not  visualized. No aortic stenosis is present. Pulmonic Valve: The pulmonic valve was normal in structure. Pulmonic valve regurgitation is not visualized. No evidence of pulmonic stenosis. Aorta: The aortic root is normal in size and structure. Venous: The inferior vena cava is normal in size with greater than 50% respiratory variability, suggesting right atrial pressure of 3 mmHg. IAS/Shunts: No atrial level shunt detected by color flow Doppler.  LEFT VENTRICLE PLAX 2D LVIDd:  4.80 cm  Diastology LVIDs:         3.50 cm  LV e' lateral:   5.11 cm/s LV PW:         1.20 cm  LV E/e' lateral: 15.1 LV IVS:        0.90 cm  LV e' medial:    5.55 cm/s LVOT diam:     1.90 cm  LV E/e' medial:  13.9 LV SV:         54 LV SV Index:   29 LVOT Area:     2.84 cm  RIGHT VENTRICLE RV Basal diam:  2.90 cm RV S prime:     12.50 cm/s TAPSE (M-mode): 2.0 cm LEFT ATRIUM             Index       RIGHT ATRIUM           Index LA diam:        3.40 cm 1.80 cm/m  RA Area:     15.30 cm LA Vol (A2C):   56.4 ml 29.88 ml/m RA Volume:   40.90 ml  21.67 ml/m LA Vol (A4C):   65.3 ml 34.59 ml/m LA Biplane Vol: 60.9 ml 32.26 ml/m  AORTIC VALVE LVOT Vmax:   93.80 cm/s LVOT Vmean:  59.100 cm/s LVOT VTI:    0.191 m  AORTA Ao Root diam: 3.10 cm Ao Asc diam:  3.10 cm MITRAL VALVE MV Area (PHT): 4.06 cm    SHUNTS MV Decel Time: 187 msec    Systemic VTI:  0.19 m MV E velocity: 77.10 cm/s  Systemic Diam: 1.90 cm MV A velocity: 81.90 cm/s MV E/A ratio:  0.94 Mihai Croitoru MD Electronically signed by Thurmon Fair MD Signature Date/Time: 03/24/2020/11:47:38 AM    Final     Cardiac Studies    Echo 03/24/20 1. Left ventricular ejection fraction, by estimation, is 60 to 65%. The  left ventricle has normal function. The left ventricle has no regional  wall motion abnormalities. Left ventricular diastolic parameters are  consistent with Grade II diastolic  dysfunction (pseudonormalization). Elevated left atrial pressure.  2. Right ventricular  systolic function is normal. The right ventricular  size is normal. Tricuspid regurgitation signal is inadequate for assessing  PA pressure.  3. Left atrial size was mildly dilated.  4. The mitral valve is normal in structure. Trivial mitral valve  regurgitation. No evidence of mitral stenosis.  5. The aortic valve is normal in structure. Aortic valve regurgitation is  not visualized. No aortic stenosis is present.  6. The inferior vena cava is normal in size with greater than 50%  respiratory variability, suggesting right atrial pressure of 3 mmHg.    Patient Profile     84 y.o. female  withhx of chronic systolic CHF, schizophrenia, CKD stage 3, nonischemic C, HTN, HLD, DM2, nonobstructive CAD, blindness who is is being seen for pre-op eval prior to cholecystectomy.  Assessment & Plan    Pre-op eval/cholcystitis/SEPSIS - patient has no recent cardiac symptoms.  - She had a cath in 2007 showing 40% prox LAD and 40% 1st diag. EF in 2009 was 30-35% but improved to 55-60% in 2/2-15 echo.  - she is not very functional at baseline using a wheelchair and assistance with ambulation - Echo this admission with EF 60-65%, G2DD, no RWMA, mildly dilated LA - Patient is at moderate risk given functional status and comorbidities. OK to proceed with surgery>tentative plan for today. Monitor fluid status  Chronic diastolic  CHF - Lasix held while patient is NPO - monitor fluid status - restart lasix after procedure   H/o of Nonischemic CM - EF normal on recent echo  HTN - At baseline pt is on Coreg 6.25mg  BID and lasix 20 mg BID>>held for soft pressures - IVF bolus given  HLD - resume atorvastatin 20 mg daily at discharge - LDL 37  AKI on CKD stage 3 - on admission creatinine was 1.86.  creatinine continues to rise 2.69>3.94 - baseline around 1.5 - wonder if this is due to hypotension    For questions or updates, please contact [facility] Please consult [email or web address] for  contact info under        Signed, Ludie Hudon David Stall, PA-C  04/08/2020, 8:27 AM

## 2020-03-26 NOTE — Procedures (Signed)
Interventional Radiology Procedure Note  Procedure: Placement of a 50F transhepatic percutaneous cholecystostomy tube.   Complications: None  Estimated Blood Loss: None  Recommendations: - Tube to gravity - Flush once per shift   Signed,  Sterling Big, MD

## 2020-03-26 NOTE — Progress Notes (Addendum)
   Vital Signs MEWS/VS Documentation      03/30/2020 1325 03/25/2020 1330 04/05/2020 1347 04/08/2020 1347   MEWS Score:  3  1  1  3    MEWS Score Color:  Yellow  Green  Green  Yellow   Resp:  --  15  --  20   Pulse:  --  84  --  75   BP:  --  (!) 103/50  --  (!) 78/40   Temp:  --  --  --  98.1 F (36.7 C)   O2 Device:  --  --  Room Air  Room Air   Level of Consciousness:  Responds to Voice  --  Responds to Voice  --      Patient with low bp at this time is not an acute change. Patient is yellow mews  MD is aware that patient has consistently had a low blood pressure and does not want to be notified of low bp's      04/13/2020,1:56 PM

## 2020-03-26 NOTE — Consult Note (Signed)
Chief Complaint: Patient was seen in consultation today for percutaneous cholecystostomy Chief Complaint  Patient presents with   Abdominal Pain    Referring Physician(s): Byerly,F  Supervising Physician: Malachy Moan  Patient Status: [facility] - In-pt  History of Present Illness: [name] is an 84 y.o. female with past medical history of CHF, coronary artery disease, diabetes, GERD, glaucoma with vision loss right eye, hyperlipidemia, hypertension, cardiomyopathy, obstructive sleep apnea, schizophrenia, and chronic kidney disease who was admitted to [facility] on 2020-04-02 with right upper quadrant pain/nausea and vomiting.  CT abdomen/ pelvis revealed:  1. Cholelithiasis with indistinctness of the gallbladder wall, pericholecystic fat stranding and small amount of fluid tracking from the gallbladder fossa to the right flank, and right pericolic gutter. Findings are suspicious for acute cholecystitis. Confirmation with right upper quadrant ultrasound may be considered, given this is a noncontrasted study and the gallbladder wall is poorly evaluated. 2. Mild indistinctness and mucosal wall thickening of the second portions of the duodenum, likely reactive. 3. Small bilateral pleural effusions and bibasilar atelectasis. 4. Calcific atherosclerotic disease of the coronary arteries.  Aortic Atherosclerosis (ICD10-I70.0).  Ultrasound abdomen revealed : 1. Sonographic findings highly suspicious for acute cholecystitis. Gallstones with wall thickening and positive sonographic Murphy sign. 2. No definite biliary dilatation. Distal common bile duct not well visualized due to bowel gas.  She is currently afebrile but BP is soft; WBC 12.5, hemoglobin 8, platelets 106K, creatinine 3.94 and climbing; total bilirubin 1.4; COVID-19 negative ;patient not ideal candidate for surgery at this time, therefore request now received for percutaneous cholecystostomy.  Past  Medical History:  Diagnosis Date   Anxiety    Arthritis    Blood transfusion without reported diagnosis    Chronic diastolic CHF (congestive heart failure) (HCC)    Coronary artery disease    LHC 05/25/06: No renal artery stenosis, proximal LAD 40%, proximal D1 40%   Diabetes mellitus    a1c 7.3 in 10/2011   GERD (gastroesophageal reflux disease)    GLAUCOMA 12/06/2008   Vision Loss right eye    HYPERLIPIDEMIA 12/27/2006   Hypertension    NICM (nonischemic cardiomyopathy) (HCC)    OSA (obstructive sleep apnea) 03/04/2012   Proliferative diabetic retinopathy 01/28/2009   Severe Disease with Near Complete Blindness.     Schizophrenia (HCC)    Stage III chronic kidney disease 01/10/2007   Systolic heart failure (HCC)    Echo 10/09: EF 30-35%. Echo 2/13: EF 50-55%.   Visual hallucinations 07/15/2011    Past Surgical History:  Procedure Laterality Date   EYE SURGERY  2012   both eyes    Allergies: Heparin and Lisinopril  Medications: Prior to Admission medications   Medication Sig Start Date End Date Taking? Authorizing Provider  allopurinol (ZYLOPRIM) 100 MG tablet Take 200 mg by mouth daily with breakfast.    Yes [provider]  aspirin EC 81 MG tablet Take 81 mg by mouth daily.   Yes [provider]  carvedilol (COREG) 6.25 MG tablet Take 6.25 mg by mouth in the morning and at bedtime.   Yes [provider]  Cholecalciferol 1.25 MG (50000 UT) TABS Take 1 tablet by mouth in the morning and at bedtime.   Yes [provider]  furosemide (LASIX) 20 MG tablet Take 20 mg by mouth 2 (two) times daily.   Yes [provider]  potassium chloride (KLOR-CON) 10 MEQ tablet Take 10 mEq by mouth daily.   Yes [provider]  sennosides-docusate sodium (SENOKOT-S) 8.6-50 MG tablet Take 2 tablets by mouth in the morning and at bedtime.    Yes [provider]  atorvastatin (LIPITOR) 20 MG tablet Take 1 tablet (20 mg  total) by mouth daily. Patient not taking: Reported on 2020-04-07 12/30/12   Ignacia Palma, MD  docusate sodium 100 MG CAPS Take 100 mg by mouth 2 (two) times daily. Patient not taking: Reported on April 07, 2020 09/13/12   Elfredia Nevins, MD  ergocalciferol (VITAMIN D2) 50000 UNITS capsule Take 50,000 Units by mouth every Wednesday.     [provider]  potassium chloride (KLOR-CON M15) 15 MEQ tablet Take 2 tablets (30 mEq total) by mouth once. 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   Social Determinants of Health   Financial Resource Strain:    Difficulty of Paying Living Expenses:   Food Insecurity:    Worried About Programme researcher, broadcasting/film/video in the Last Year:    Barista in the Last Year:   Transportation Needs:    Freight forwarder (Medical):    Lack of Transportation (Non-Medical):   Physical Activity:    Days of Exercise per Week:    Minutes of Exercise per Session:   Stress:    Feeling of Stress :   Social Connections:    Frequency of Communication with Friends and Family:     Frequency of Social Gatherings with Friends and Family:    Attends Religious Services:    Active Member of Clubs or Organizations:    Attends Banker Meetings:    Marital Status:       Review of Systems currently denies fever, headache, chest pain, worsening dyspnea, cough, back pain, nausea, vomiting or bleeding.  Continues to have significant right upper quadrant discomfort.  Vital Signs: BP (!) 77/25 (BP Location: Right Arm)    Pulse 96    Temp 98.9 F (37.2 C) (Oral)    Resp 20    Ht 5\' 3"  (1.6 m)    Wt 189 lb (85.7 kg)    SpO2 97%    BMI 33.48 kg/m   Physical Exam awake, answers simple questions appropriately.  Keeps eyes closed.  Daughter in room.  Chest with slightly diminished breath sounds bases.  Heart with regular rate and rhythm.  Abdomen obese, soft, positive bowel sounds, moderate tenderness right upper quadrant.  No significant lower extremity edema.  Imaging: CT ABDOMEN PELVIS WO CONTRAST  Result Date: 04/07/2020 CLINICAL DATA:  Right-sided  abdominal pain. EXAM: CT ABDOMEN AND PELVIS WITHOUT CONTRAST TECHNIQUE: Multidetector CT imaging of the abdomen and pelvis was performed following the standard protocol without IV contrast. COMPARISON:  September 23, 2016 FINDINGS: Lower chest: Small bilateral pleural effusions and bibasilar atelectasis. Calcific atherosclerotic disease of the coronary arteries. Hepatobiliary: No focal liver abnormality is seen. Numerous layering gallstones. Indistinctness of the gallbladder wall with pericholecystic fat stranding and small amount of fluid tracking from the gallbladder fossa to the right flank. Pancreas: Unremarkable. No pancreatic ductal dilatation or surrounding inflammatory changes. Spleen: Normal in size without focal abnormality. Adrenals/Urinary Tract: Adrenal glands are unremarkable. Kidneys are normal, without renal calculi, focal lesion, or hydronephrosis. Bladder is unremarkable. Stomach/Bowel: Mild indistinctness and  mucosal wall thickening of the second portions of the duodenum. Normal caliber of the stomach, small bowel and colon. Vascular/Lymphatic: Aortic atherosclerosis. No enlarged abdominal or pelvic lymph nodes. Reproductive: Uterus and bilateral adnexa are unremarkable. Other: No abdominal wall hernia or abnormality. Small amount of free fluid in the anterior pelvis. Musculoskeletal: Extensive spondylosis of the lumbosacral spine. IMPRESSION: 1. Cholelithiasis with indistinctness of the gallbladder wall, pericholecystic fat stranding and small amount of fluid tracking from the gallbladder fossa to the right flank, and right pericolic gutter. Findings are suspicious for acute cholecystitis. Confirmation with right upper quadrant ultrasound may be considered, given this is a noncontrasted study and the gallbladder wall is poorly evaluated. 2. Mild indistinctness and mucosal wall thickening of the second portions of the duodenum, likely reactive. 3. Small bilateral pleural effusions and bibasilar atelectasis. 4. Calcific atherosclerotic disease of the coronary arteries. Aortic Atherosclerosis (ICD10-I70.0). These results were called by telephone at the time of interpretation on 04/10/2020 at 3:24 pm to provider Dr. Rhunette Croft, who verbally acknowledged these results. Electronically Signed   By: Ted Mcalpine M.D.   On: 04/08/2020 15:28   ECHOCARDIOGRAM COMPLETE  Result Date: 03/24/2020    ECHOCARDIOGRAM REPORT   Patient Name:   ALEIYA RYE Date of Exam: 03/24/2020 Medical Rec #:  [id number]         Height:       63.0 in Accession #:    [id number]        Weight:       189.0 lb Date of Birth:  1936/05/14        BSA:          1.888 m Patient Age:    83 years          BP:           99/42 mmHg Patient Gender: F                 HR:           87 bpm. Exam Location:  Inpatient Procedure: 2D Echo, Cardiac Doppler and Color Doppler Indications:    I50.33 Acute on chronic diastolic (congestive) heart failure  History:         Patient has prior history of Echocardiogram examinations, most                 recent 02/07/2014. Cardiomyopathy, CAD; Risk                 Factors:Hypertension, Diabetes, Dyslipidemia and GERD. CKD.  Sonographer:    Elmarie Shiley Dance Referring Phys: 72 BRIAN S CRENSHAW  Sonographer Comments: No subcostal window. IMPRESSIONS  1. Left ventricular ejection fraction, by estimation, is 60 to 65%. The left ventricle has normal function. The left ventricle has no regional wall motion  abnormalities. Left ventricular diastolic parameters are consistent with Grade II diastolic dysfunction (pseudonormalization). Elevated left atrial pressure.  2. Right ventricular systolic function is normal. The right ventricular size is normal. Tricuspid regurgitation signal is inadequate for assessing PA pressure.  3. Left atrial size was mildly dilated.  4. The mitral valve is normal in structure. Trivial mitral valve regurgitation. No evidence of mitral stenosis.  5. The aortic valve is normal in structure. Aortic valve regurgitation is not visualized. No aortic stenosis is present.  6. The inferior vena cava is normal in size with greater than 50% respiratory variability, suggesting right atrial pressure of 3 mmHg. Comparison(s): No significant change from prior study. Prior images reviewed side by side. FINDINGS  Left Ventricle: Left ventricular ejection fraction, by estimation, is 60 to 65%. The left ventricle has normal function. The left ventricle has no regional wall motion abnormalities. The left ventricular internal cavity size was normal in size. There is  borderline concentric left ventricular hypertrophy. Left ventricular diastolic parameters are consistent with Grade II diastolic dysfunction (pseudonormalization). Elevated left atrial pressure. Right Ventricle: The right ventricular size is normal. No increase in right ventricular wall thickness. Right ventricular systolic function is normal. Tricuspid regurgitation signal is  inadequate for assessing PA pressure. Left Atrium: Left atrial size was mildly dilated. Right Atrium: Right atrial size was normal in size. Pericardium: There is no evidence of pericardial effusion. Mitral Valve: The mitral valve is normal in structure. Normal mobility of the mitral valve leaflets. Moderate mitral annular calcification. Trivial mitral valve regurgitation. No evidence of mitral valve stenosis. Tricuspid Valve: The tricuspid valve is normal in structure. Tricuspid valve regurgitation is trivial. No evidence of tricuspid stenosis. Aortic Valve: The aortic valve is normal in structure. Aortic valve regurgitation is not visualized. No aortic stenosis is present. Pulmonic Valve: The pulmonic valve was normal in structure. Pulmonic valve regurgitation is not visualized. No evidence of pulmonic stenosis. Aorta: The aortic root is normal in size and structure. Venous: The inferior vena cava is normal in size with greater than 50% respiratory variability, suggesting right atrial pressure of 3 mmHg. IAS/Shunts: No atrial level shunt detected by color flow Doppler.  LEFT VENTRICLE PLAX 2D LVIDd:         4.80 cm  Diastology LVIDs:         3.50 cm  LV e' lateral:   5.11 cm/s LV PW:         1.20 cm  LV E/e' lateral: 15.1 LV IVS:        0.90 cm  LV e' medial:    5.55 cm/s LVOT diam:     1.90 cm  LV E/e' medial:  13.9 LV SV:         54 LV SV Index:   29 LVOT Area:     2.84 cm  RIGHT VENTRICLE RV Basal diam:  2.90 cm RV S prime:     12.50 cm/s TAPSE (M-mode): 2.0 cm LEFT ATRIUM             Index       RIGHT ATRIUM           Index LA diam:        3.40 cm 1.80 cm/m  RA Area:     15.30 cm LA Vol (A2C):   56.4 ml 29.88 ml/m RA Volume:   40.90 ml  21.67 ml/m LA Vol (A4C):   65.3 ml 34.59 ml/m LA Biplane Vol: 60.9 ml 32.26 ml/m  AORTIC VALVE  LVOT Vmax:   93.80 cm/s LVOT Vmean:  59.100 cm/s LVOT VTI:    0.191 m  AORTA Ao Root diam: 3.10 cm Ao Asc diam:  3.10 cm MITRAL VALVE MV Area (PHT): 4.06 cm    SHUNTS MV Decel  Time: 187 msec    Systemic VTI:  0.19 m MV E velocity: 77.10 cm/s  Systemic Diam: 1.90 cm MV A velocity: 81.90 cm/s MV E/A ratio:  0.94 Mihai Croitoru MD Electronically signed by Sanda Klein MD Signature Date/Time: 03/24/2020/11:47:38 AM    Final    US Abdomen Limited RUQ  Result Date: 03/30/2020 CLINICAL DATA:  Abdominal pain. EXAM: ULTRASOUND ABDOMEN LIMITED RIGHT UPPER QUADRANT COMPARISON:  Abdominal CT earlier today. FINDINGS: Gallbladder: Distended containing multiple intraluminal gallstones and sludge. There is wall thickening at 5-6 mm. Positive sonographic Murphy sign noted by sonographer. Common bile duct: Diameter: 4 mm proximally.  Distal aspect not well visualized. Liver: Technically limited and challenging exam. No focal lesion identified. Within normal limits in parenchymal echogenicity. Portal vein is patent on color Doppler imaging with normal direction of blood flow towards the liver. Other: None. IMPRESSION: 1. Sonographic findings highly suspicious for acute cholecystitis. Gallstones with wall thickening and positive sonographic Murphy sign. 2. No definite biliary dilatation. Distal common bile duct not well visualized due to bowel gas. Electronically Signed   By: Keith Rake M.D.   On: 03/21/2020 16:32    Labs:  CBC: Recent Labs    04/04/2020 1147 03/24/20 0550 03/25/20 0115 2020-04-22 0555  WBC 20.7* 22.4* 20.0* 12.5*  HGB 10.6* 8.5* 7.9* 8.0*  HCT 33.8* 26.5* 25.2* 24.8*  PLT 154 119* 114* 106*    COAGS: Recent Labs    03/31/2020 1532  INR 1.2  APTT 33    BMP: Recent Labs    03/25/2020 1147 03/24/20 0550 03/25/20 0115 04/22/2020 0555  NA 139 139 135 137  K 3.8 4.0 4.0 4.0  CL 105 107 105 109  CO2 26 23 22  20*  GLUCOSE 120* 79 122* 78  BUN 20 24* 29* 37*  CALCIUM 8.5* 7.8* 7.6* 7.3*  CREATININE 1.86* 2.01* 2.69* 3.94*  GFRNONAA 25* 23* 16* 10*  GFRAA 29* 26* 18* 12*    LIVER FUNCTION TESTS: Recent Labs    03/21/2020 1147 03/24/20 0550 03/25/20 0115  Apr 22, 2020 0555  BILITOT 1.4* 1.4* 1.5* 1.4*  AST 48* 48* 46* 46*  ALT 29 43 38 27  ALKPHOS 63 54 63 78  PROT 6.1* 5.0* 5.1* 5.1*  ALBUMIN 3.1* 2.3* 2.4* 2.5*    TUMOR MARKERS: No results for input(s): AFPTM, CEA, CA199, CHROMGRNA in the last 8760 hours.  Assessment and Plan: 83 y.o. female with past medical history of CHF, coronary artery disease, diabetes, GERD, glaucoma with vision loss right eye, hyperlipidemia, hypertension, cardiomyopathy, obstructive sleep apnea, schizophrenia, and chronic kidney disease who was admitted to [facility] on 04/11/2020 with right upper quadrant pain/nausea and vomiting.  CT abdomen /pelvis revealed:  1. Cholelithiasis with indistinctness of the gallbladder wall, pericholecystic fat stranding and small amount of fluid tracking from the gallbladder fossa to the right flank, and right pericolic gutter. Findings are suspicious for acute cholecystitis. Confirmation with right upper quadrant ultrasound may be considered, given this is a noncontrasted study and the gallbladder wall is poorly evaluated. 2. Mild indistinctness and mucosal wall thickening of the second portions of the duodenum, likely reactive. 3. Small bilateral pleural effusions and bibasilar atelectasis. 4. Calcific atherosclerotic disease of the coronary arteries.  Aortic  Atherosclerosis (ICD10-I70.0).  Ultrasound abdomen revealed : 1. Sonographic findings highly suspicious for acute cholecystitis. Gallstones with wall thickening and positive sonographic Murphy sign. 2. No definite biliary dilatation. Distal common bile duct not well visualized due to bowel gas.  She is currently afebrile but BP is soft; WBC 12.5, hemoglobin 8, platelets 106K, PT/INR nl;creatinine 3.94 and climbing; total bilirubin 1.4; COVID-19 negative ;patient not ideal candidate for surgery at this time therefore request now received for percutaneous cholecystostomy.  Imaging studies reviewed by Dr.  Archer Asa.  Details/risks of procedure, including but not limited to, internal bleeding, infection, injury to adjacent structures, prolonged gallbladder drainage discussed with patient and daughter with their understanding and consent.  Procedure scheduled for this afternoon.   Thank you for this interesting consult.  I greatly enjoyed meeting [name] and look forward to participating in their care.  A copy of this report was sent to the requesting provider on this date.  Electronically Signed: D. Jeananne Rama, PA-C 04/05/2020, 11:59 AM   I spent a total of  30 minutes   in face to face in clinical consultation, greater than 50% of which was counseling/coordinating care for percutaneous cholecystostomy

## 2020-03-26 NOTE — Progress Notes (Addendum)
PROGRESS NOTE    [name]  RKY:[id number] DOB: [birth year] DOA: 03/31/2020 PCP: Jethro Bastos, MD   Brief Narrative:  [name] is a 84 y.o. female with medical history significant of clinically blind, diabetes which is diet controlled, hypertension, dCHF, CKD stage IIIb and remote history of pancreatitis who [facility], presented to the emergency department complaining of RUQ abdominal pain and vomiting for 1 day.  Patient reported after having her meal she developed severe abdominal pain, which was sharp in nature.  And started vomiting.  Later on she developed chills and had diarrhea.  Patient denies chest pain, shortness of breath, dizziness, weakness and cough. In ED: Vitals stable, CT of the abdomen revealed possible acute cholecystitis and cholelithiasis. White count 20 K with left shift, hemoglobin 10.6, creatinine of 1.86, this and total bili minimally elevated.  Renal ultrasound pending.  ER physician called as a code sepsis with sofa score of 2. Surgical team was consulted which recommended to admit to medicine and treat with antibiotic for now.  Subjective: Overnight patient's blood pressure continues to drop currently 70/40, remains asymptomatic (without tachypnea, tachycardia or complaints of lightheadedness or dizziness). Urine output continues to be poorly measured -urinating in the bed, Foley catheter to be placed, challenge this morning and repeat labs this afternoon to follow creatinine given elevation overnight.  Surgery currently on hold.  Patient herself states she feels generalized pain in her abdomen and generally unwell but nonspecific.  Declines of breath, chest pain, fevers, chills, nausea, vomiting, diarrhea, constipation.  Afternoon update - patient repeat BMP shows worsening creatinine, potassium, low CO2 despite increasing IV fluids - will have nephrology consult - unclear if this is related to pre-renal/hypotension; sepsis; or  possibly intra-renal etiology that would benefit from second look/further evaluation from specialist. Family unavailable by phone - discussed with nephrology - given age/comorbidities not a candidate for dialysis.   Assessment & Plan:   Active Problems:   Acute cholecystitis   Coronary artery disease involving native coronary artery of native heart without angina pectoris   Acute calculous cholecystitis   Acute kidney injury superimposed on chronic kidney disease (HCC)   Sepsis due to acute cholecystitis, present on admission. - General surgery following, tentative plan for lap chole this morning on hold due to patient's soft blood pressure and poor urine output, elevated creatinine as below - Continue cefepime and Flagyl for coverage of abdomen - Currently very poor p.o. intake prior to admission  Acute on CKD stage IIIb  with oliguria - Poor p.o. intake in the setting of above likely exacerbating CKD 3B -  NPO perioperatively but maintenance fluids ongoing - Creatinine worsening despite IV fluids - will increase IV fluids today as well as fluid challenge this morning with 500 cc bolus - we will repeat as indicated - Repeat creatinine this afternoon, if worsening will discuss with nephrology - Unclear if this is simply prerenal due to poor volume status or worsening intra-abdominal infection/sepsis - Baseline appears to be around 1.5 per chart review - Foley catheter to follow UOP more accurately Lab Results  Component Value Date   CREATININE 4.06 (H) 04-19-20   CREATININE 3.94 (H) Apr 19, 2020   CREATININE 2.69 (H) 03/25/2020   HTN, essential - currently hypotensive - Discontinue carvedilol, Lasix previously on hold given AKI - Follow clinically, blood pressure as low as 70s/30s today, patient does have wide pulse pressure which skews her MAP to be on the low side - Patient's chronic  blood pressure appears to be around 100/60 per chart review - Continue IV fluids in the setting  of poor p.o. intake - multiple boluses now with minimal change  Anemia, likely acute hemodilutional anemia on chronic anemia of chronic disease - H/H downtrending somewhat - likely somewhat hemodilutional - continue to monitor CBC Latest Ref Rng & Units 03/25/2020 03/25/2020 03/24/2020  WBC 4.0 - 10.5 K/uL 12.5(H) 20.0(H) 22.4(H)  Hemoglobin 12.0 - 15.0 g/dL 8.0(L) 7.9(L) 8.5(L)  Hematocrit 36.0 - 46.0 % 24.8(L) 25.2(L) 26.5(L)  Platelets 150 - 400 K/uL 106(L) 114(L) 119(L)   Chronic diastolic CHF  - Appears euvolemic, hold Lasix given AKI as above  - Cardiology following at the request of surgery, echocardiogram as below - Fluids cautiously as above - monitor for respiratory decline  DVT prophylaxis: Lovenox   Code Status: DNR  Family Communication: Daughter [name] unavailable by phone x2 today.  Disposition Plan: Anticipate discharge to previous home environment pending further evaluation, work-up and likely surgical intervention..  Admission status: Inpatient, Medsurg -continues to require further monitoring in the setting of sepsis, acute cholecystitis, hypotension, requiring IV fluids, IV antibiotics and IV pain medication.  Consultants:   General Surgery  Cardiology  IR  Nephrology  Procedures:   IR placed perc drain of gallbladder  Antimicrobials:  Cefepime, Flagyl  Objective: Vitals:   04/19/2020 1347 03/25/2020 1347 04/17/2020 1442 04/07/2020 1625  BP:  (!) 78/40 (!) 76/41 (!) 78/38  Pulse:  75 79 73  Resp:  20 16 20   Temp:  98.1 F (36.7 C) (!) 97.5 F (36.4 C) 97.6 F (36.4 C)  TempSrc:  Oral Axillary Axillary  SpO2: 90%  93% 95%  Weight:      Height:        Intake/Output Summary (Last 24 hours) at 03/27/2020 1650 Last data filed at 03/25/2020 0600 Gross per 24 hour  Intake 1141.01 ml  Output --  Net 1141.01 ml   Filed Weights   03/31/2020 1153  Weight: 85.7 kg    Examination:  General:  Pleasantly resting in bed, No acute distress.  HEENT:  Normocephalic  atraumatic.  Sclerae nonicteric, noninjected.  Extraocular movements intact bilaterally. Neck:  Without mass or deformity.  Trachea is midline. Lungs:  Clear to auscultate bilaterally without rhonchi, wheeze, or rales. Heart:  Regular rate and rhythm.  Without murmurs, rubs, or gallops. Abdomen:  Soft, moderate tenderness generally but PMI right upper quadrant Extremities: Without cyanosis, clubbing, edema, or obvious deformity. Vascular:  Dorsalis pedis and posterior tibial pulses palpable bilaterally. Skin:  Warm and dry, no erythema, no ulcerations.   Data Reviewed: I have personally reviewed following labs and imaging studies  CBC: Recent Labs  Lab 03/25/2020 1147 03/24/20 0550 03/25/20 0115 04/07/2020 0555  WBC 20.7* 22.4* 20.0* 12.5*  NEUTROABS 17.7*  --   --   --   HGB 10.6* 8.5* 7.9* 8.0*  HCT 33.8* 26.5* 25.2* 24.8*  MCV 105.6* 105.2* 106.3* 105.1*  PLT 154 119* 114* 106*   Basic Metabolic Panel: Recent Labs  Lab 04/14/2020 1147 03/24/20 0550 03/25/20 0115 04/14/2020 0555 04/07/2020 1210  NA 139 139 135 137 136  K 3.8 4.0 4.0 4.0 4.7  CL 105 107 105 109 106  CO2 26 23 22  20* 19*  GLUCOSE 120* 79 122* 78 73  BUN 20 24* 29* 37* 39*  CREATININE 1.86* 2.01* 2.69* 3.94* 4.06*  CALCIUM 8.5* 7.8* 7.6* 7.3* 7.2*   GFR: Estimated Creatinine Clearance: 11.3 mL/min (A) (by C-G formula based  on SCr of 4.06 mg/dL (H)). Liver Function Tests: Recent Labs  Lab 04/12/2020 1147 03/24/20 0550 03/25/20 0115 04-18-20 0555  AST 48* 48* 46* 46*  ALT 29 43 38 27  ALKPHOS 63 54 63 78  BILITOT 1.4* 1.4* 1.5* 1.4*  PROT 6.1* 5.0* 5.1* 5.1*  ALBUMIN 3.1* 2.3* 2.4* 2.5*   Recent Labs  Lab 04/15/2020 1147  LIPASE 23   No results for input(s): AMMONIA in the last 168 hours. Coagulation Profile: Recent Labs  Lab 04/09/2020 1532  INR 1.2   Cardiac Enzymes: No results for input(s): CKTOTAL, CKMB, CKMBINDEX, TROPONINI in the last 168 hours. BNP (last 3 results) No results for  input(s): PROBNP in the last 8760 hours. HbA1C: No results for input(s): HGBA1C in the last 72 hours. CBG: Recent Labs  Lab 03/24/20 2139 03/25/20 0600  GLUCAP 142* 94   Lipid Profile: Recent Labs    03/25/20 0200  CHOL 82  HDL 32*  LDLCALC 37  TRIG 66  CHOLHDL 2.6   Thyroid Function Tests: No results for input(s): TSH, T4TOTAL, FREET4, T3FREE, THYROIDAB in the last 72 hours. Anemia Panel: No results for input(s): VITAMINB12, FOLATE, FERRITIN, TIBC, IRON, RETICCTPCT in the last 72 hours. Sepsis Labs: Recent Labs  Lab 03/28/2020 1812 04/12/2020 2303 03/24/20 2141 03/25/20 0115  LATICACIDVEN 2.4* 1.5 2.9* 1.7    Recent Results (from the past 240 hour(s))  Blood Culture (routine x 2)     Status: None (Preliminary result)   Collection Time: 03/25/2020  3:32 PM   Specimen: BLOOD  Result Value Ref Range Status   Specimen Description   Final    BLOOD LEFT ANTECUBITAL Performed at [facility], [address] [place], [place] [address]    Special Requests   Final    BOTTLES DRAWN AEROBIC AND ANAEROBIC Blood Culture results may not be optimal due to an inadequate volume of blood received in culture bottles Performed at [facility], [address] [place], [place] [address]    Culture   Final    NO GROWTH 3 DAYS Performed at [facility], [address], [place], [place] [address]    Report Status PENDING  Incomplete  Respiratory Panel by RT PCR (Flu A&B, Covid) - Nasopharyngeal Swab     Status: None   Collection Time: 04/03/2020  3:35 PM   Specimen: Nasopharyngeal Swab  Result Value Ref Range Status   SARS Coronavirus 2 by RT PCR NEGATIVE NEGATIVE Final    Comment: (NOTE) SARS-CoV-2 target nucleic acids are NOT DETECTED. The SARS-CoV-2 RNA is generally detectable in upper respiratoy specimens during the acute phase of infection. The lowest concentration of SARS-CoV-2 viral copies this assay can detect is 131 copies/mL. A  negative result does not preclude SARS-Cov-2 infection and should not be used as the sole basis for treatment or other patient management decisions. A negative result may occur with  improper specimen collection/handling, submission of specimen other than nasopharyngeal swab, presence of viral mutation(s) within the areas targeted by this assay, and inadequate number of viral copies (<131 copies/mL). A negative result must be combined with clinical observations, patient history, and epidemiological information. The expected result is Negative. Fact Sheet for Patients:  https://www.moore.com/ Fact Sheet for Healthcare Providers:  https://www.young.biz/ This test is not yet ap proved or cleared by the [place] FDA and  has been authorized for detection and/or diagnosis of SARS-CoV-2 by FDA under an Emergency Use Authorization (EUA). This EUA will remain  in effect (meaning this test can be used) for the duration of the COVID-19 declaration under Section 564(b)(1) of the Act, 21 U.S.C. section 360bbb-3(b)(1), unless the authorization is terminated or revoked sooner.    Influenza A by PCR NEGATIVE NEGATIVE Final   Influenza B by PCR NEGATIVE NEGATIVE Final    Comment: (NOTE) The Xpert Xpress SARS-CoV-2/FLU/RSV assay is intended as an aid in  the diagnosis of influenza from Nasopharyngeal swab specimens and  should not be used as a sole basis for treatment. Nasal washings and  aspirates are unacceptable for Xpert Xpress SARS-CoV-2/FLU/RSV  testing. Fact Sheet for Patients: https://www.moore.com/ Fact Sheet for Healthcare Providers: https://www.young.biz/ This test is not yet approved or cleared by the [place] FDA and  has been authorized for detection and/or diagnosis of SARS-CoV-2 by  FDA under an Emergency Use Authorization (EUA). This EUA will remain  in effect (meaning this test can be used) for  the duration of the  Covid-19 declaration under Section 564(b)(1) of the Act, 21  U.S.C. section 360bbb-3(b)(1), unless the authorization is  terminated or revoked. Performed at [facility], [address], [place], [place] [address]   Blood Culture (routine x 2)     Status: None (Preliminary result)   Collection Time: 03/22/2020  3:37 PM   Specimen: BLOOD RIGHT HAND  Result Value Ref Range Status   Specimen Description   Final    BLOOD RIGHT HAND Performed at [facility], [address], [place], [place] [address]    Special Requests   Final    BOTTLES DRAWN AEROBIC AND ANAEROBIC Blood Culture adequate volume Performed at [facility], [address], [place], [place] [address]    Culture   Final    NO GROWTH 3 DAYS Performed at [facility], [address] [place], [place] [address]    Report Status PENDING  Incomplete  Urine culture     Status: None   Collection Time: 03/24/20 12:34 AM   Specimen: In/Out Cath Urine  Result Value Ref Range Status   Specimen Description   Final    IN/OUT CATH URINE Performed at [facility], [address], [place], [place] [address]    Special Requests   Final    NONE Performed at [facility], [address], [place], [place] [address]    Culture   Final    NO GROWTH Performed at [facility], [address] [place], [place] [address]    Report Status 03/25/2020 FINAL  Final  Surgical pcr screen     Status: None   Collection Time: 03/25/20  7:44 PM   Specimen: Nasal Mucosa; Nasal Swab  Result Value Ref Range Status   MRSA, PCR NEGATIVE NEGATIVE Final   Staphylococcus aureus NEGATIVE NEGATIVE Final    Comment: (NOTE) The Xpert SA Assay (FDA approved for NASAL specimens in patients 20 years of age and older), is one component of a comprehensive surveillance program. It is not intended to diagnose infection nor to guide or  monitor treatment. Performed at [facility], [address] [place], [place] [address]          Radiology Studies: IR Perc Cholecystostomy  Result Date: 03/31/2020 INDICATION: 84 year old female with acute calculus cholecystitis. Her condition has deteriorated today concerning for worsening cholecystitis. She is no longer a surgical candidate and presents for placement of an urgent percutaneous cholecystostomy tube. EXAM: CHOLECYSTOSTOMY MEDICATIONS: 400 mg  ciprofloxacin; The antibiotic was administered within an appropriate time frame prior to the initiation of the procedure. ANESTHESIA/SEDATION: Moderate (conscious) sedation was employed during this procedure. A total of Versed 1.5 mg and Fentanyl 50 mcg was administered intravenously. Moderate Sedation Time: 17 minutes. The patient's level of consciousness and vital signs were monitored continuously by radiology nursing throughout the procedure under my direct supervision. FLUOROSCOPY TIME:  Fluoroscopy Time: 0 minutes 36 seconds (9 mGy). COMPLICATIONS: None immediate. PROCEDURE: Informed written consent was obtained from the patient after a thorough discussion of the procedural risks, benefits and alternatives. All questions were addressed. Maximal Sterile Barrier Technique was utilized including caps, mask, sterile gowns, sterile gloves, sterile drape, hand hygiene and skin antiseptic. A timeout was performed prior to the initiation of the procedure. The right upper quadrant was interrogated with ultrasound. The dilated gallbladder was successfully identified. A suitable skin entry site was selected and marked. Local anesthesia was attained by infiltration with 1% lidocaine. A small dermatotomy was made. Under real-time ultrasound guidance, a 21 gauge Chiba needle was advanced over a rib, along a short transhepatic tract and into the gallbladder lumen. There was return of black bile. A 0.018 wire was advanced into the  gallbladder. The needle was removed. A hydrophilic transitional dilator was advanced over the wire and into the gallbladder lumen. The wire and inner dilator were removed. Contrast injection was then performed opacifying the gallbladder lumen. Numerous filling defects are present consistent with cholelithiasis. A 0.035 wire was then coiled in the gallbladder lumen. The transitional dilator was removed. The skin tract was then dilated to 10 [place] and a Cook 10.2 [place] all-purpose drainage catheter advanced over the wire and formed in the gallbladder lumen. An image was obtained and stored for the medical record. Aspiration yields dark black bile. No frank purulence. The catheter was gently flushed and connected to gravity bag drainage. The catheter was secured to the skin with 0 Prolene suture and an adhesive fixation device. The patient tolerated the procedure well. IMPRESSION: Successful placement of a 10 French percutaneous transhepatic percutaneous cholecystostomy tube for acute calculus cholecystitis and a non operative candidate. PLAN: Maintain drain to gravity bag drainage. Flush drainage catheter at least once per shift with 5 mL sterile saline. Follow-up cholangiogram at drain clinic in approximately 4 weeks. Signed, Criselda Peaches, MD, [facility] Radiology Electronically Signed   By: Jacqulynn Cadet M.D.   On: 04/03/2020 14:02        Scheduled Meds: . Chlorhexidine Gluconate Cloth  6 each Topical Daily  . enoxaparin (LOVENOX) injection  30 mg Subcutaneous Q24H  . fentaNYL      . lidocaine      . midazolam       Continuous Infusions: . sodium chloride 75 mL/hr at 04/04/2020 0219  . sodium chloride 125 mL/hr at 03/25/2020 1630  . ceFEPime (MAXIPIME) IV 1 g (04/19/2020 1629)  . metronidazole 500 mg (03/30/2020 1047)     LOS: 3 days   Time spent: 22min  Amiliah Campisi C Jnae Thomaston, DO [facility]  If 7PM-7AM, please contact  night-coverage [email or web address]  03/29/2020, 4:50 PM

## 2020-03-26 NOTE — Anesthesia Preprocedure Evaluation (Deleted)
Anesthesia Evaluation    Reviewed: Allergy & Precautions, Patient's Chart, lab work & pertinent test results, reviewed documented beta blocker date and time   History of Anesthesia Complications Negative for: history of anesthetic complications  Airway        Dental   Pulmonary sleep apnea ,           Cardiovascular hypertension, Pt. on medications and Pt. on home beta blockers + CAD and +CHF    TTE 03/24/20: EF 65%, grade II diastolic dysfunction, mild LAE   Neuro/Psych Anxiety Schizophrenia negative neurological ROS     GI/Hepatic Neg liver ROS, GERD  Controlled,  Endo/Other  diabetes, Type 2  Renal/GU Renal Insufficiency and CRFRenal disease (Cr 3.94)  negative genitourinary   Musculoskeletal  (+) Arthritis ,   Abdominal   Peds  Hematology  (+) anemia , Hgb 8.0, plt 106   Anesthesia Other Findings Day of surgery medications reviewed with patient.  Reproductive/Obstetrics negative OB ROS                             Anesthesia Physical Anesthesia Plan  ASA: III  Anesthesia Plan: General   Post-op Pain Management:    Induction: Intravenous  PONV Risk Score and Plan: 4 or greater and Treatment may vary due to age or medical condition, Ondansetron, Dexamethasone and Midazolam  Airway Management Planned: Oral ETT  Additional Equipment: None  Intra-op Plan:   Post-operative Plan: Extubation in OR  Informed Consent:   Patient has DNR.     Plan Discussed with:   Anesthesia Plan Comments:         Anesthesia Quick Evaluation

## 2020-03-26 NOTE — Progress Notes (Signed)
   Vital Signs MEWS/VS Documentation      04-06-20 1347 2020-04-06 1347 Apr 06, 2020 1442 2020-04-06 1625   MEWS Score:  1  3  3  3    MEWS Score Color:  Green  Yellow  Yellow  Yellow   Resp:  --  20  16  20    Pulse:  --  75  79  73   BP:  --  (!) 78/40  (!) 76/41  (!) 78/38   Temp:  --  98.1 F (36.7 C)  (!) 97.5 F (36.4 C)  97.6 F (36.4 C)   O2 Device:  Room Air  Room Air  --  --   Level of Consciousness:  Responds to Voice  --  --  --     Patient remains yellow mews at this time with no acute change noted.        Adynn Caseres 04-06-2020,4:30 PM

## 2020-03-26 NOTE — Progress Notes (Signed)
B/p 75/35 asymptomatic Md not notified as per Nsg note

## 2020-03-26 NOTE — Progress Notes (Signed)
   Vital Signs MEWS/VS Documentation      03/21/2020 1330 04/03/2020 1347 04/11/2020 1347 04/05/2020 1442   MEWS Score:  1  1  3  3    MEWS Score Color:  Green  Green  Yellow  Yellow   Resp:  15  --  20  16   Pulse:  84  --  75  79   BP:  (!) 103/50  --  (!) 78/40  (!) 76/41   Temp:  --  --  98.1 F (36.7 C)  (!) 97.5 F (36.4 C)   O2 Device:  --  Room Air  Room Air  --   Level of Consciousness:  --  Responds to Voice  --  --      Patient remains yellow mews with no acute change at this time     03/25/2020,2:49 PM

## 2020-03-27 ENCOUNTER — Encounter (HOSPITAL_COMMUNITY): Payer: Self-pay | Admitting: Family Medicine

## 2020-03-27 DIAGNOSIS — I5032 Chronic diastolic (congestive) heart failure: Secondary | ICD-10-CM

## 2020-03-27 DIAGNOSIS — K810 Acute cholecystitis: Secondary | ICD-10-CM | POA: Diagnosis not present

## 2020-03-27 DIAGNOSIS — N179 Acute kidney failure, unspecified: Secondary | ICD-10-CM

## 2020-03-27 LAB — CBC
HCT: 22.3 % — ABNORMAL LOW (ref 36.0–46.0)
Hemoglobin: 7.2 g/dL — ABNORMAL LOW (ref 12.0–15.0)
MCH: 34.1 pg — ABNORMAL HIGH (ref 26.0–34.0)
MCHC: 32.3 g/dL (ref 30.0–36.0)
MCV: 105.7 fL — ABNORMAL HIGH (ref 80.0–100.0)
Platelets: 114 10*3/uL — ABNORMAL LOW (ref 150–400)
RBC: 2.11 MIL/uL — ABNORMAL LOW (ref 3.87–5.11)
RDW: 15 % (ref 11.5–15.5)
WBC: 10.4 10*3/uL (ref 4.0–10.5)
nRBC: 0 % (ref 0.0–0.2)

## 2020-03-27 LAB — URINALYSIS, ROUTINE W REFLEX MICROSCOPIC
Bilirubin Urine: NEGATIVE
Glucose, UA: 50 mg/dL — AB
Ketones, ur: 5 mg/dL — AB
Nitrite: NEGATIVE
Protein, ur: 100 mg/dL — AB
Specific Gravity, Urine: 1.017 (ref 1.005–1.030)
Squamous Epithelial / HPF: >50 — ABNORMAL HIGH (ref 0–5)
WBC, UA: >50 WBC/hpf — ABNORMAL HIGH (ref 0–5)
pH: 5 (ref 5.0–8.0)

## 2020-03-27 LAB — SODIUM, URINE, RANDOM: Sodium, Ur: 19 mmol/L

## 2020-03-27 LAB — CREATININE, URINE, RANDOM: Creatinine, Urine: 133.02 mg/dL

## 2020-03-27 LAB — COMPREHENSIVE METABOLIC PANEL
ALT: 21 U/L (ref 0–44)
AST: 48 U/L — ABNORMAL HIGH (ref 15–41)
Albumin: 2.1 g/dL — ABNORMAL LOW (ref 3.5–5.0)
Alkaline Phosphatase: 81 U/L (ref 38–126)
Anion gap: 9 (ref 5–15)
BUN: 41 mg/dL — ABNORMAL HIGH (ref 8–23)
CO2: 18 mmol/L — ABNORMAL LOW (ref 22–32)
Calcium: 7.3 mg/dL — ABNORMAL LOW (ref 8.9–10.3)
Chloride: 111 mmol/L (ref 98–111)
Creatinine, Ser: 4.36 mg/dL — ABNORMAL HIGH (ref 0.44–1.00)
GFR calc Af Amer: 10 mL/min — ABNORMAL LOW (ref >60)
GFR calc non Af Amer: 9 mL/min — ABNORMAL LOW (ref >60)
Glucose, Bld: 93 mg/dL (ref 70–99)
Potassium: 4.1 mmol/L (ref 3.5–5.1)
Sodium: 138 mmol/L (ref 135–145)
Total Bilirubin: 0.9 mg/dL (ref 0.3–1.2)
Total Protein: 4.7 g/dL — ABNORMAL LOW (ref 6.5–8.1)

## 2020-03-27 LAB — PREPARE RBC (CROSSMATCH)

## 2020-03-27 LAB — ABO/RH: ABO/RH(D): O NEG

## 2020-03-27 MED ORDER — SODIUM CHLORIDE 0.9% IV SOLUTION: Freq: Once | INTRAVENOUS | Status: AC

## 2020-03-27 NOTE — Progress Notes (Signed)
1 Day Post-Op    CC: Right upper quadrant pain  Subjective: Patient is talkative but she is confused and does not really give [place] any useful information.  Her abdomen less tender than yesterday.  IR drain in place with green bilious fluid.  Objective: Vital signs in last 24 hours: Temp:  [97.5 F (36.4 C)-98.9 F (37.2 C)] 97.7 F (36.5 C) (04/07 0529) Pulse Rate:  [73-96] 76 (04/07 0529) Resp:  [15-22] 17 (04/07 0529) BP: (73-105)/(25-69) 91/41 (04/07 0529) SpO2:  [90 %-100 %] 100 % (04/07 0529) Last BM Date: 03-28-2020 1953 IV Urine - none recorded Stool x 2 No recording from the drain Afebrile, BP improving; sats good on RA, HR in 70's Creatinine increasing 4.36 WBC improving 10.4 H/H 8.5/26.5>>7.9/25.2>>8.0/24.8>>7.2/22.3(4/7) Intake/Output from previous day: 04/06 0701 - 04/07 0700 In: 1953.9 [I.V.:1553.9; IV Piggyback:400] Out: 0  Intake/Output this shift: No intake/output data recorded.  General appearance: alert, cooperative, no distress and Confused she does not know where she is most of her speech is disconnected. Resp: clear to auscultation bilaterally and Clear anterior. GI: Soft, minimal tenderness in the right upper quadrant.  Remainder of her abdomen appears nontender on exam.  There is no distention.  Lab Results:  Recent Labs    04/02/2020 0555 03/27/20 0554  WBC 12.5* 10.4  HGB 8.0* 7.2*  HCT 24.8* 22.3*  PLT 106* 114*    BMET Recent Labs    03/25/2020 1210 03/27/20 0554  NA 136 138  K 4.7 4.1  CL 106 111  CO2 19* 18*  GLUCOSE 73 93  BUN 39* 41*  CREATININE 4.06* 4.36*  CALCIUM 7.2* 7.3*   PT/INR No results for input(s): LABPROT, INR in the last 72 hours.  Recent Labs  Lab Mar 28, 2020 1147 03/24/20 0550 03/25/20 0115 04/02/2020 0555 03/27/20 0554  AST 48* 48* 46* 46* 48*  ALT 29 43 38 27 21  ALKPHOS 63 54 63 78 81  BILITOT 1.4* 1.4* 1.5* 1.4* 0.9  PROT 6.1* 5.0* 5.1* 5.1* 4.7*  ALBUMIN 3.1* 2.3* 2.4* 2.5* 2.1*     Lipase      Component Value Date/Time   LIPASE 23 03-28-2020 1147     Medications: . Chlorhexidine Gluconate Cloth  6 each Topical Daily  . enoxaparin (LOVENOX) injection  30 mg Subcutaneous Q24H     Assessment/Plan HTN HLD CKD3  - Creatinine 1.86>> 2.01>> 2.69>> 3.94 >>4.36(4/7) Chronic diastolic CHF  -Echo shows EF 60 to 65% no significant valvular abnormality, patient is    moderate risk but not prohibitive for cholecystectomy Hypotension - improving Anemia  - H/H 8.5/26.5>>7.9/25.2>>8.0/24.8>>7.2/22.3(4/7) DNR Blind  Acute cholecystitis - total bilirubin 1.4>>1.4>> 1.5>> 1.4>>0.9  - WBC 20.7>> 22.4>> 20.0>> 12.5>>10.4(4/7) IR drain placement 03/29/2020 ID -maxipime/flagyl 4/3>> FEN -IVF, CLD, NPO after MN VTE -lovenox Foley -none Follow up -TBD  Plan: Agree with clear liquids and ongoing antibiotics.  Medical management for now.  She will need keep the drain for at least 6 weeks. She has progressively worsening anemia and renal function.    LOS: 4 days    Yovanna Cogan 03/27/2020 Please see Amion

## 2020-03-27 NOTE — Progress Notes (Signed)
Progress Note  Patient Name: [name] Date of Encounter: 03/27/2020  Primary Cardiologist: Charlton Haws, MD   Subjective   Drain placed yesterday by IR. Pressures still soft, IVF ordered. Patient still mildly confused on exam. No chest pain.  Inpatient Medications    Scheduled Meds: . Chlorhexidine Gluconate Cloth  6 each Topical Daily  . enoxaparin (LOVENOX) injection  30 mg Subcutaneous Q24H   Continuous Infusions: . sodium chloride 75 mL/hr at 03/28/2020 0219  . sodium chloride 125 mL/hr at 03/27/20 0456  . ceFEPime (MAXIPIME) IV Stopped (03/28/2020 1659)  . metronidazole Stopped (03/27/20 0337)   PRN Meds: HYDROcodone-acetaminophen, ondansetron **OR** ondansetron (ZOFRAN) IV   Vital Signs    Vitals:   04/16/2020 1824 03/21/2020 2038 03/25/2020 2216 03/27/20 0529  BP: (!) 89/39 (!) 95/35 (!) 105/39 (!) 91/41  Pulse: 77 74 79 76  Resp: 20 17 17 17   Temp: 97.9 F (36.6 C) 97.8 F (36.6 C) 97.9 F (36.6 C) 97.7 F (36.5 C)  TempSrc: Axillary Oral    SpO2: 97% 99% 100% 100%  Weight:      Height:        Intake/Output Summary (Last 24 hours) at 03/27/2020 0805 Last data filed at 03/27/2020 0500 Gross per 24 hour  Intake 1953.89 ml  Output 0 ml  Net 1953.89 ml   Last 3 Weights 12-Apr-2020 10/01/2016 09/30/2016  Weight (lbs) 189 lb 220 lb 0.3 oz 215 lb 13.3 oz  Weight (kg) 85.73 kg 99.8 kg 97.9 kg      Telemetry    N/A - Personally Reviewed  ECG    No new - Personally Reviewed  Physical Exam   GEN: No acute distress.   Neck: No JVD Cardiac: RRR, no murmurs, rubs, or gallops.  Respiratory: Clear to auscultation bilaterally. GI: Soft, nontender, non-distended  MS: No edema; No deformity. Neuro: A&O x2  Psych: Normal affect   Labs    High Sensitivity Troponin:  No results for input(s): TROPONINIHS in the last 720 hours.    Chemistry Recent Labs  Lab 03/25/20 0115 03/25/20 0115 04/17/2020 0555 04/05/2020 1210 03/27/20 0554  NA 135   < > 137 136 138   K 4.0   < > 4.0 4.7 4.1  CL 105   < > 109 106 111  CO2 22   < > 20* 19* 18*  GLUCOSE 122*   < > 78 73 93  BUN 29*   < > 37* 39* 41*  CREATININE 2.69*   < > 3.94* 4.06* 4.36*  CALCIUM 7.6*   < > 7.3* 7.2* 7.3*  PROT 5.1*  --  5.1*  --  4.7*  ALBUMIN 2.4*  --  2.5*  --  2.1*  AST 46*  --  46*  --  48*  ALT 38  --  27  --  21  ALKPHOS 63  --  78  --  81  BILITOT 1.5*  --  1.4*  --  0.9  GFRNONAA 16*   < > 10* 10* 9*  GFRAA 18*   < > 12* 11* 10*  ANIONGAP 8   < > 8 11 9    < > = values in this interval not displayed.     Hematology Recent Labs  Lab 03/25/20 0115 03/27/2020 0555 03/27/20 0554  WBC 20.0* 12.5* 10.4  RBC 2.37* 2.36* 2.11*  HGB 7.9* 8.0* 7.2*  HCT 25.2* 24.8* 22.3*  MCV 106.3* 105.1* 105.7*  MCH 33.3 33.9 34.1*  MCHC  31.3 32.3 32.3  RDW 14.9 15.0 15.0  PLT 114* 106* 114*    BNPNo results for input(s): BNP, PROBNP in the last 168 hours.   DDimer No results for input(s): DDIMER in the last 168 hours.   Radiology    IR Perc Cholecystostomy  Result Date: 03/28/2020 INDICATION: 84 year old female with acute calculus cholecystitis. Her condition has deteriorated today concerning for worsening cholecystitis. She is no longer a surgical candidate and presents for placement of an urgent percutaneous cholecystostomy tube. EXAM: CHOLECYSTOSTOMY MEDICATIONS: 400 mg ciprofloxacin; The antibiotic was administered within an appropriate time frame prior to the initiation of the procedure. ANESTHESIA/SEDATION: Moderate (conscious) sedation was employed during this procedure. A total of Versed 1.5 mg and Fentanyl 50 mcg was administered intravenously. Moderate Sedation Time: 17 minutes. The patient's level of consciousness and vital signs were monitored continuously by radiology nursing throughout the procedure under my direct supervision. FLUOROSCOPY TIME:  Fluoroscopy Time: 0 minutes 36 seconds (9 mGy). COMPLICATIONS: None immediate. PROCEDURE: Informed written consent was obtained  from the patient after a thorough discussion of the procedural risks, benefits and alternatives. All questions were addressed. Maximal Sterile Barrier Technique was utilized including caps, mask, sterile gowns, sterile gloves, sterile drape, hand hygiene and skin antiseptic. A timeout was performed prior to the initiation of the procedure. The right upper quadrant was interrogated with ultrasound. The dilated gallbladder was successfully identified. A suitable skin entry site was selected and marked. Local anesthesia was attained by infiltration with 1% lidocaine. A small dermatotomy was made. Under real-time ultrasound guidance, a 21 gauge Chiba needle was advanced over a rib, along a short transhepatic tract and into the gallbladder lumen. There was return of black bile. A 0.018 wire was advanced into the gallbladder. The needle was removed. A hydrophilic transitional dilator was advanced over the wire and into the gallbladder lumen. The wire and inner dilator were removed. Contrast injection was then performed opacifying the gallbladder lumen. Numerous filling defects are present consistent with cholelithiasis. A 0.035 wire was then coiled in the gallbladder lumen. The transitional dilator was removed. The skin tract was then dilated to 10 [place] and a Cook 10.2 [place] all-purpose drainage catheter advanced over the wire and formed in the gallbladder lumen. An image was obtained and stored for the medical record. Aspiration yields dark black bile. No frank purulence. The catheter was gently flushed and connected to gravity bag drainage. The catheter was secured to the skin with 0 Prolene suture and an adhesive fixation device. The patient tolerated the procedure well. IMPRESSION: Successful placement of a 10 French percutaneous transhepatic percutaneous cholecystostomy tube for acute calculus cholecystitis and a non operative candidate. PLAN: Maintain drain to gravity bag drainage. Flush drainage catheter at  least once per shift with 5 mL sterile saline. Follow-up cholangiogram at drain clinic in approximately 4 weeks. Signed, Criselda Peaches, MD, [facility] Radiology Electronically Signed   By: Jacqulynn Cadet M.D.   On: 04/09/2020 14:02    Cardiac Studies   Echo 03/24/20 1. Left ventricular ejection fraction, by estimation, is 60 to 65%. The  left ventricle has normal function. The left ventricle has no regional  wall motion abnormalities. Left ventricular diastolic parameters are  consistent with Grade II diastolic  dysfunction (pseudonormalization). Elevated left atrial pressure.  2. Right ventricular systolic function is normal. The right ventricular  size is normal. Tricuspid regurgitation signal is inadequate for assessing  PA pressure.  3. Left atrial size was  mildly dilated.  4. The mitral valve is normal in structure. Trivial mitral valve  regurgitation. No evidence of mitral stenosis.  5. The aortic valve is normal in structure. Aortic valve regurgitation is  not visualized. No aortic stenosis is present.  6. The inferior vena cava is normal in size with greater than 50%  respiratory variability, suggesting right atrial pressure of 3 mmHg.  Patient Profile     84 y.o. female  with hx of chronic systolic CHF, schizophrenia, CKD stage 3, nonischemic C, HTN, HLD, DM2, nonobstructive CAD, blindness who is is being seen for pre-op eval prior to cholecystectomy.  Assessment & Plan    Pre-op eval/cholcystitis/SEPSIS - patient has no recent cardiac symptoms.  - She had a cath in 2007 showing 40% prox LAD and 40% 1st diag. EF in 2009 was 30-35% but improved to 55-60% in 2/2-15 echo.  - she is not very functional at baseline using a wheelchair and assistance with ambulation - Echo this admission with EF 60-65%, G2DD, no RWMA, mildly dilated LA - Patient is at moderate risk given functional status and comorbidities. -  surgery canceled yesterday for low BP and rising creatinine. Drain placed yesterday by IR. Leukocytosis improving but creatinine worse today  Chronic diastolic CHF - Lasix held while patient is NPO - monitor fluid status - restart lasix after procedure   H/o of Nonischemic CM - EF normal on recent echo  HTN - At baseline pt is on Coreg 6.25mg  BID and lasix 20 mg BID>>held for soft pressures - given IVF  HLD - resume atorvastatin 20 mg daily at discharge - LDL 37  AKI on CKD stage 3 - on admission creatinine was 1.86.  creatinine continues to rise 2.69>3.94>4.36 - baseline around 1.5 - Would consult nephrology  For questions or updates, please contact [facility] Please consult [email or web address] for contact info under        Signed, Shafter Jupin David Stall, PA-C  03/27/2020, 8:05 AM

## 2020-03-27 NOTE — Progress Notes (Signed)
Referring Physician(s): Byerly,F  Supervising Physician: Ruel Favors  Patient Status:  [facility] [address] - In-pt  Chief Complaint: Abdominal pain/cholecystitis   Subjective: Patient resting quietly in bed, some confusion persists, has some mild right upper quadrant tenderness, denies nausea/ vomiting.  BP remains soft.   Allergies: Heparin and Lisinopril  Medications: Prior to Admission medications   Medication Sig Start Date End Date Taking? Authorizing Provider  allopurinol (ZYLOPRIM) 100 MG tablet Take 200 mg by mouth daily with breakfast.    Yes [provider]  aspirin EC 81 MG tablet Take 81 mg by mouth daily.   Yes [provider]  carvedilol (COREG) 6.25 MG tablet Take 6.25 mg by mouth in the morning and at bedtime.   Yes [provider]  Cholecalciferol 1.25 MG (50000 UT) TABS Take 1 tablet by mouth in the morning and at bedtime.   Yes [provider]  furosemide (LASIX) 20 MG tablet Take 20 mg by mouth 2 (two) times daily.   Yes [provider]  potassium chloride (KLOR-CON) 10 MEQ tablet Take 10 mEq by mouth daily.   Yes [provider]  sennosides-docusate sodium (SENOKOT-S) 8.6-50 MG tablet Take 2 tablets by mouth in the morning and at bedtime.    Yes [provider]  atorvastatin (LIPITOR) 20 MG tablet Take 1 tablet (20 mg total) by mouth daily. Patient not taking: Reported on 03/29/2020 12/30/12   Ignacia Palma, MD  docusate sodium 100 MG CAPS Take 100 mg by mouth 2 (two) times daily. Patient not taking: Reported on 04/16/2020 09/13/12   Elfredia Nevins, MD  ergocalciferol (VITAMIN D2) 50000 UNITS capsule Take 50,000 Units by mouth every Wednesday.     [provider]  potassium chloride (KLOR-CON M15) 15 MEQ tablet Take 2 tablets (30 mEq total) by mouth once. Patient not taking: Reported on 03/31/2020 10/01/16 10/01/16  Rhetta Mura, MD     Vital Signs: BP (!) 91/41 (BP Location: Left Arm)    Pulse 76   Temp 97.7 F (36.5 C)   Resp 17   Ht 5\' 3"  (1.6 m)   Wt 189 lb (85.7 kg)   SpO2 100%   BMI 33.48 kg/m   Physical Exam patient awake, will answer some simple questions with yes or no response; gallbladder drain intact, insertion site okay, mildly tender to palpation, output 70 cc green bile, drain flushed without difficulty  Imaging: CT ABDOMEN PELVIS WO CONTRAST  Result Date: 04/12/2020 CLINICAL DATA:  Right-sided abdominal pain. EXAM: CT ABDOMEN AND PELVIS WITHOUT CONTRAST TECHNIQUE: Multidetector CT imaging of the abdomen and pelvis was performed following the standard protocol without IV contrast. COMPARISON:  September 23, 2016 FINDINGS: Lower chest: Small bilateral pleural effusions and bibasilar atelectasis. Calcific atherosclerotic disease of the coronary arteries. Hepatobiliary: No focal liver abnormality is seen. Numerous layering gallstones. Indistinctness of the gallbladder wall with pericholecystic fat stranding and small amount of fluid tracking from the gallbladder fossa to the right flank. Pancreas: Unremarkable. No pancreatic ductal dilatation or surrounding inflammatory changes. Spleen: Normal in size without focal abnormality. Adrenals/Urinary Tract: Adrenal glands are unremarkable. Kidneys are normal, without renal calculi, focal lesion, or hydronephrosis. Bladder is unremarkable. Stomach/Bowel: Mild indistinctness and mucosal wall thickening of the second portions of the duodenum. Normal caliber of the stomach, small bowel and colon. Vascular/Lymphatic: Aortic atherosclerosis. No enlarged abdominal or pelvic lymph nodes. Reproductive: Uterus and bilateral adnexa are unremarkable. Other: No abdominal wall hernia or abnormality. Small amount of free fluid in  the anterior pelvis. Musculoskeletal: Extensive spondylosis of the lumbosacral spine. IMPRESSION: 1. Cholelithiasis with indistinctness of the gallbladder wall, pericholecystic fat stranding and small amount of fluid  tracking from the gallbladder fossa to the right flank, and right pericolic gutter. Findings are suspicious for acute cholecystitis. Confirmation with right upper quadrant ultrasound may be considered, given this is a noncontrasted study and the gallbladder wall is poorly evaluated. 2. Mild indistinctness and mucosal wall thickening of the second portions of the duodenum, likely reactive. 3. Small bilateral pleural effusions and bibasilar atelectasis. 4. Calcific atherosclerotic disease of the coronary arteries. Aortic Atherosclerosis (ICD10-I70.0). These results were called by telephone at the time of interpretation on 01-Apr-2020 at 3:24 pm to provider Dr. Kathrynn Humble, who verbally acknowledged these results. Electronically Signed   By: Fidela Salisbury M.D.   On: April 01, 2020 15:28   IR Perc Cholecystostomy  Result Date: 03/31/2020 INDICATION: 84 year old female with acute calculus cholecystitis. Her condition has deteriorated today concerning for worsening cholecystitis. She is no longer a surgical candidate and presents for placement of an urgent percutaneous cholecystostomy tube. EXAM: CHOLECYSTOSTOMY MEDICATIONS: 400 mg ciprofloxacin; The antibiotic was administered within an appropriate time frame prior to the initiation of the procedure. ANESTHESIA/SEDATION: Moderate (conscious) sedation was employed during this procedure. A total of Versed 1.5 mg and Fentanyl 50 mcg was administered intravenously. Moderate Sedation Time: 17 minutes. The patient's level of consciousness and vital signs were monitored continuously by radiology nursing throughout the procedure under my direct supervision. FLUOROSCOPY TIME:  Fluoroscopy Time: 0 minutes 36 seconds (9 mGy). COMPLICATIONS: None immediate. PROCEDURE: Informed written consent was obtained from the patient after a thorough discussion of the procedural risks, benefits and alternatives. All questions were addressed. Maximal Sterile Barrier Technique was utilized  including caps, mask, sterile gowns, sterile gloves, sterile drape, hand hygiene and skin antiseptic. A timeout was performed prior to the initiation of the procedure. The right upper quadrant was interrogated with ultrasound. The dilated gallbladder was successfully identified. A suitable skin entry site was selected and marked. Local anesthesia was attained by infiltration with 1% lidocaine. A small dermatotomy was made. Under real-time ultrasound guidance, a 21 gauge Chiba needle was advanced over a rib, along a short transhepatic tract and into the gallbladder lumen. There was return of black bile. A 0.018 wire was advanced into the gallbladder. The needle was removed. A hydrophilic transitional dilator was advanced over the wire and into the gallbladder lumen. The wire and inner dilator were removed. Contrast injection was then performed opacifying the gallbladder lumen. Numerous filling defects are present consistent with cholelithiasis. A 0.035 wire was then coiled in the gallbladder lumen. The transitional dilator was removed. The skin tract was then dilated to 10 [place] and a Cook 10.2 [place] all-purpose drainage catheter advanced over the wire and formed in the gallbladder lumen. An image was obtained and stored for the medical record. Aspiration yields dark black bile. No frank purulence. The catheter was gently flushed and connected to gravity bag drainage. The catheter was secured to the skin with 0 Prolene suture and an adhesive fixation device. The patient tolerated the procedure well. IMPRESSION: Successful placement of a 10 French percutaneous transhepatic percutaneous cholecystostomy tube for acute calculus cholecystitis and a non operative candidate. PLAN: Maintain drain to gravity bag drainage. Flush drainage catheter at least once per shift with 5 mL sterile saline. Follow-up cholangiogram at drain clinic in approximately 4 weeks. Signed, Criselda Peaches, MD, [facility] Radiology Electronically  Signed   By: Malachy Moan M.D.   On: 04/04/2020 14:02   ECHOCARDIOGRAM COMPLETE  Result Date: 03/24/2020    ECHOCARDIOGRAM REPORT   Patient Name:   CAMELLIA POPESCU Date of Exam: 03/24/2020 Medical Rec #:  [id number]         Height:       63.0 in Accession #:    [id number]        Weight:       189.0 lb Date of Birth:  10/01/1936        BSA:          1.888 m Patient Age:    83 years          BP:           99/42 mmHg Patient Gender: F                 HR:           87 bpm. Exam Location:  Inpatient Procedure: 2D Echo, Cardiac Doppler and Color Doppler Indications:    I50.33 Acute on chronic diastolic (congestive) heart failure  History:        Patient has prior history of Echocardiogram examinations, most                 recent 02/07/2014. Cardiomyopathy, CAD; Risk                 Factors:Hypertension, Diabetes, Dyslipidemia and GERD. CKD.  Sonographer:    Elmarie Shiley Dance Referring Phys: 70 BRIAN S CRENSHAW  Sonographer Comments: No subcostal window. IMPRESSIONS  1. Left ventricular ejection fraction, by estimation, is 60 to 65%. The left ventricle has normal function. The left ventricle has no regional wall motion abnormalities. Left ventricular diastolic parameters are consistent with Grade II diastolic dysfunction (pseudonormalization). Elevated left atrial pressure.  2. Right ventricular systolic function is normal. The right ventricular size is normal. Tricuspid regurgitation signal is inadequate for assessing PA pressure.  3. Left atrial size was mildly dilated.  4. The mitral valve is normal in structure. Trivial mitral valve regurgitation. No evidence of mitral stenosis.  5. The aortic valve is normal in structure. Aortic valve regurgitation is not visualized. No aortic stenosis is present.  6. The inferior vena cava is normal in size with greater than 50% respiratory variability, suggesting right atrial pressure of 3 mmHg. Comparison(s): No  significant change from prior study. Prior images reviewed side by side. FINDINGS  Left Ventricle: Left ventricular ejection fraction, by estimation, is 60 to 65%. The left ventricle has normal function. The left ventricle has no regional wall motion abnormalities. The left ventricular internal cavity size was normal in size. There is  borderline concentric left ventricular hypertrophy. Left ventricular diastolic parameters are consistent with Grade II diastolic dysfunction (pseudonormalization). Elevated left atrial pressure. Right Ventricle: The right ventricular size is normal. No increase in right ventricular wall thickness. Right ventricular systolic function is normal. Tricuspid regurgitation signal is inadequate for assessing PA pressure. Left Atrium: Left atrial size was mildly dilated. Right Atrium: Right atrial size was normal in size. Pericardium: There is no evidence of pericardial effusion. Mitral Valve: The mitral valve is normal in structure. Normal mobility of the mitral valve leaflets. Moderate mitral annular calcification. Trivial mitral valve regurgitation. No evidence of mitral valve stenosis. Tricuspid Valve: The tricuspid valve is normal in structure. Tricuspid valve regurgitation is trivial. No evidence of tricuspid stenosis. Aortic Valve: The aortic valve is normal in structure. Aortic valve regurgitation  is not visualized. No aortic stenosis is present. Pulmonic Valve: The pulmonic valve was normal in structure. Pulmonic valve regurgitation is not visualized. No evidence of pulmonic stenosis. Aorta: The aortic root is normal in size and structure. Venous: The inferior vena cava is normal in size with greater than 50% respiratory variability, suggesting right atrial pressure of 3 mmHg. IAS/Shunts: No atrial level shunt detected by color flow Doppler.  LEFT VENTRICLE PLAX 2D LVIDd:         4.80 cm  Diastology LVIDs:         3.50 cm  LV e' lateral:   5.11 cm/s LV PW:         1.20 cm  LV E/e'  lateral: 15.1 LV IVS:        0.90 cm  LV e' medial:    5.55 cm/s LVOT diam:     1.90 cm  LV E/e' medial:  13.9 LV SV:         54 LV SV Index:   29 LVOT Area:     2.84 cm  RIGHT VENTRICLE RV Basal diam:  2.90 cm RV S prime:     12.50 cm/s TAPSE (M-mode): 2.0 cm LEFT ATRIUM             Index       RIGHT ATRIUM           Index LA diam:        3.40 cm 1.80 cm/m  RA Area:     15.30 cm LA Vol (A2C):   56.4 ml 29.88 ml/m RA Volume:   40.90 ml  21.67 ml/m LA Vol (A4C):   65.3 ml 34.59 ml/m LA Biplane Vol: 60.9 ml 32.26 ml/m  AORTIC VALVE LVOT Vmax:   93.80 cm/s LVOT Vmean:  59.100 cm/s LVOT VTI:    0.191 m  AORTA Ao Root diam: 3.10 cm Ao Asc diam:  3.10 cm MITRAL VALVE MV Area (PHT): 4.06 cm    SHUNTS MV Decel Time: 187 msec    Systemic VTI:  0.19 m MV E velocity: 77.10 cm/s  Systemic Diam: 1.90 cm MV A velocity: 81.90 cm/s MV E/A ratio:  0.94 Mihai Croitoru MD Electronically signed by Thurmon Fair MD Signature Date/Time: 03/24/2020/11:47:38 AM    Final    US Abdomen Limited RUQ  Result Date: 03/26/2020 CLINICAL DATA:  Abdominal pain. EXAM: ULTRASOUND ABDOMEN LIMITED RIGHT UPPER QUADRANT COMPARISON:  Abdominal CT earlier today. FINDINGS: Gallbladder: Distended containing multiple intraluminal gallstones and sludge. There is wall thickening at 5-6 mm. Positive sonographic Murphy sign noted by sonographer. Common bile duct: Diameter: 4 mm proximally.  Distal aspect not well visualized. Liver: Technically limited and challenging exam. No focal lesion identified. Within normal limits in parenchymal echogenicity. Portal vein is patent on color Doppler imaging with normal direction of blood flow towards the liver. Other: None. IMPRESSION: 1. Sonographic findings highly suspicious for acute cholecystitis. Gallstones with wall thickening and positive sonographic Murphy sign. 2. No definite biliary dilatation. Distal common bile duct not well visualized due to bowel gas. Electronically Signed   By: Narda Rutherford M.D.    On: 04/12/2020 16:32    Labs:  CBC: Recent Labs    03/24/20 0550 03/25/20 0115 03/25/2020 0555 03/27/20 0554  WBC 22.4* 20.0* 12.5* 10.4  HGB 8.5* 7.9* 8.0* 7.2*  HCT 26.5* 25.2* 24.8* 22.3*  PLT 119* 114* 106* 114*    COAGS: Recent Labs    03/30/2020 1532  INR 1.2  APTT 33  BMP: Recent Labs    03/25/20 0115 04/04/20 0555 2020-04-04 1210 03/27/20 0554  NA 135 137 136 138  K 4.0 4.0 4.7 4.1  CL 105 109 106 111  CO2 22 20* 19* 18*  GLUCOSE 122* 78 73 93  BUN 29* 37* 39* 41*  CALCIUM 7.6* 7.3* 7.2* 7.3*  CREATININE 2.69* 3.94* 4.06* 4.36*  GFRNONAA 16* 10* 10* 9*  GFRAA 18* 12* 11* 10*    LIVER FUNCTION TESTS: Recent Labs    03/24/20 0550 03/25/20 0115 April 04, 2020 0555 03/27/20 0554  BILITOT 1.4* 1.5* 1.4* 0.9  AST 48* 46* 46* 48*  ALT 43 38 27 21  ALKPHOS 54 63 78 81  PROT 5.0* 5.1* 5.1* 4.7*  ALBUMIN 2.3* 2.4* 2.5* 2.1*    Assessment and Plan: Patient with history of acute calculus cholecystitis (poor surgical candidate), hypotension, chronic kidney disease, heart failure, coronary artery disease, diabetes; status post percutaneous cholecystostomy on 4/6; afebrile, WBC normal, hemoglobin 7.2 down from 8, creatinine 4.36 up from 4.06, culture negative today, culture negative, bile culture pending. Gallbladder drain will need to remain in place at least 4 to 6 weeks unless cholecystectomy done in interim.  Continue with drain irrigation, output monitoring, lab checks; she will be scheduled for follow-up drain injection in 4 to 6 weeks.  Other plans per surgery, TRH.  Electronically Signed: D. Jeananne Rama, PA-C 03/27/2020, 11:43 AM   I spent a total of 15 minutes at the the patient's bedside AND on the patient's hospital floor or unit, greater than 50% of which was counseling/coordinating care for percutaneous cholecystostomy    Patient ID: Reita Cliche, female   DOB: Nov 24, 1936, 84 y.o.   MRN: [id number]

## 2020-03-27 NOTE — Progress Notes (Addendum)
PROGRESS NOTE    MATTILYN CRITES  WUJ:[id number] DOB: 1936/11/07 DOA: 04/07/2020 PCP: Jethro Bastos, MD   Brief Narrative:  SHAQUINTA PERUSKI is a 84 y.o. female with medical history significant of clinically blind, diabetes which is diet controlled, hypertension, dCHF, CKD stage IIIb and remote history of pancreatitis who [facility], presented to the emergency department complaining of RUQ abdominal pain and vomiting for 1 day.  Patient reported after having her meal she developed severe abdominal pain, which was sharp in nature.  And started vomiting.  Later on she developed chills and had diarrhea.  Patient denies chest pain, shortness of breath, dizziness, weakness and cough. In ED: Vitals stable, CT of the abdomen revealed possible acute cholecystitis and cholelithiasis. White count 20 K with left shift, hemoglobin 10.6, creatinine of 1.86, this and total bili minimally elevated.  Renal ultrasound pending.  ER physician called as a code sepsis with sofa score of 2. Surgical team was consulted which recommended to admit to medicine and treat with antibiotic for now.  Subjective: No acute issues or events overnight, patient's creatinine continues to climb, family discussion with [name] at bedside about patient's ongoing issues given infection, low blood pressure, advanced age and comorbid conditions.  Patient declines any chest pain, shortness of breath, nausea, vomiting she does indicate ongoing abdominal pain but this is markedly improved from previous.  Assessment & Plan:   Active Problems:   Acute cholecystitis   Coronary artery disease involving native coronary artery of native heart without angina pectoris   Acute calculous cholecystitis   Acute kidney injury superimposed on chronic kidney disease (HCC)    Sepsis due to acute cholecystitis, present on admission. - General surgery following, status post percutaneous chole drain 04/11/2020 - will need to remain in  place for 4 to 6 weeks unless cholecystectomy can be done in the interim per IR. - Continue cefepime and Flagyl for coverage of abdomen - Currently very poor p.o. intake prior to admission -continue IV fluids, advance diet per surgical recommendations  Acute on CKD stage IIIb  with oliguria, ongoing - Poor p.o. intake in the setting of above likely exacerbating CKD 3B  -Creatinine still climbing overnight although not as aggressively, nephrology following, appreciate insight and recommendations -Currently continues on IV fluids, hoping to advance patient's diet currently on clears - Unclear if this is simply prerenal due to poor volume status or worsening intra-abdominal infection/sepsis - Baseline appears to be around 1.5 per chart review - Foley catheter to follow UOP more accurately Lab Results  Component Value Date   CREATININE 4.36 (H) 03/27/2020   CREATININE 4.06 (H) 04/04/2020   CREATININE 3.94 (H) 04/18/2020   HTN, essential - currently hypotensive - Discontinue carvedilol, Lasix previously on hold given AKI - Follow clinically, blood pressure minimally improving with IV fluids and advancement of diet - Patient's chronic blood pressure appears to be around 100/60 per chart review - Continue IV fluids in the setting of poor p.o. intake - multiple boluses now with minimal change  Anemia, likely acute hemodilutional anemia on chronic anemia of chronic disease - H/H downtrending somewhat - likely somewhat hemodilutional/post procedure- continue to monitor -Transfuse 1 unit PRBC given worsening anemia, renal function, volume status CBC Latest Ref Rng & Units 03/27/2020 03/27/2020 03/25/2020  WBC 4.0 - 10.5 K/uL 10.4 12.5(H) 20.0(H)  Hemoglobin 12.0 - 15.0 g/dL 7.2(L) 8.0(L) 7.9(L)  Hematocrit 36.0 - 46.0 % 22.3(L) 24.8(L) 25.2(L)  Platelets 150 - 400 K/uL 114(L)  106(L) 114(L)   Chronic diastolic CHF  - Appears euvolemic, hold Lasix given AKI as above  - Cardiology following at the  request of surgery, echocardiogram as below - Fluids cautiously as above - monitor for respiratory decline  DVT prophylaxis: Lovenox   Code Status: DNR  Family Communication: Daughter [name] updated at bedside Disposition Plan: Anticipate discharge to previous home environment pending further evaluation, work-up and likely surgical intervention..  Admission status: Inpatient, Medsurg -continues to require further monitoring in the setting of sepsis, acute cholecystitis, hypotension, requiring IV fluids, IV antibiotics and IV pain medication.  Consultants:   General Surgery  Cardiology  IR  Nephrology  Procedures:   IR placed perc drain of gallbladder 18-Apr-2020  Antimicrobials:  Cefepime, Flagyl  Objective: Vitals:   04-18-2020 1824 2020-04-18 2038 04-18-2020 2216 03/27/20 0529  BP: (!) 89/39 (!) 95/35 (!) 105/39 (!) 91/41  Pulse: 77 74 79 76  Resp: 20 17 17 17   Temp: 97.9 F (36.6 C) 97.8 F (36.6 C) 97.9 F (36.6 C) 97.7 F (36.5 C)  TempSrc: Axillary Oral    SpO2: 97% 99% 100% 100%  Weight:      Height:        Intake/Output Summary (Last 24 hours) at 03/27/2020 0716 Last data filed at 03/27/2020 0500 Gross per 24 hour  Intake 1953.89 ml  Output 0 ml  Net 1953.89 ml   Filed Weights   04/11/2020 1153  Weight: 85.7 kg    Examination:  General:  Pleasantly resting in bed, No acute distress.  HEENT:  Normocephalic atraumatic.  Sclerae nonicteric, noninjected.  Extraocular movements intact bilaterally. Neck:  Without mass or deformity.  Trachea is midline. Lungs:  Clear to auscultate bilaterally without rhonchi, wheeze, or rales. Heart:  Regular rate and rhythm.  Without murmurs, rubs, or gallops. Abdomen:  Soft, moderate tenderness generally but PMI right upper quadrant Extremities: Without cyanosis, clubbing, edema, or obvious deformity. Vascular:  Dorsalis pedis and posterior tibial pulses palpable bilaterally. Skin:  Warm and dry, no erythema, no  ulcerations.   Data Reviewed: I have personally reviewed following labs and imaging studies  CBC: Recent Labs  Lab 04/14/2020 1147 03/24/20 0550 03/25/20 0115 2020/04/18 0555 03/27/20 0554  WBC 20.7* 22.4* 20.0* 12.5* 10.4  NEUTROABS 17.7*  --   --   --   --   HGB 10.6* 8.5* 7.9* 8.0* 7.2*  HCT 33.8* 26.5* 25.2* 24.8* 22.3*  MCV 105.6* 105.2* 106.3* 105.1* 105.7*  PLT 154 119* 114* 106* 114*   Basic Metabolic Panel: Recent Labs  Lab 03/24/20 0550 03/25/20 0115 04-18-2020 0555 April 18, 2020 1210 03/27/20 0554  NA 139 135 137 136 138  K 4.0 4.0 4.0 4.7 4.1  CL 107 105 109 106 111  CO2 23 22 20* 19* 18*  GLUCOSE 79 122* 78 73 93  BUN 24* 29* 37* 39* 41*  CREATININE 2.01* 2.69* 3.94* 4.06* 4.36*  CALCIUM 7.8* 7.6* 7.3* 7.2* 7.3*   GFR: Estimated Creatinine Clearance: 10.5 mL/min (A) (by C-G formula based on SCr of 4.36 mg/dL (H)). Liver Function Tests: Recent Labs  Lab 03/31/2020 1147 03/24/20 0550 03/25/20 0115 Apr 18, 2020 0555 03/27/20 0554  AST 48* 48* 46* 46* 48*  ALT 29 43 38 27 21  ALKPHOS 63 54 63 78 81  BILITOT 1.4* 1.4* 1.5* 1.4* 0.9  PROT 6.1* 5.0* 5.1* 5.1* 4.7*  ALBUMIN 3.1* 2.3* 2.4* 2.5* 2.1*   Recent Labs  Lab 04/11/2020 1147  LIPASE 23   No results for input(s): AMMONIA  in the last 168 hours. Coagulation Profile: Recent Labs  Lab 05-Apr-2020 1532  INR 1.2   Cardiac Enzymes: No results for input(s): CKTOTAL, CKMB, CKMBINDEX, TROPONINI in the last 168 hours. BNP (last 3 results) No results for input(s): PROBNP in the last 8760 hours. HbA1C: No results for input(s): HGBA1C in the last 72 hours. CBG: Recent Labs  Lab 03/24/20 2139 03/25/20 0600  GLUCAP 142* 94   Lipid Profile: Recent Labs    03/25/20 0200  CHOL 82  HDL 32*  LDLCALC 37  TRIG 66  CHOLHDL 2.6   Thyroid Function Tests: No results for input(s): TSH, T4TOTAL, FREET4, T3FREE, THYROIDAB in the last 72 hours. Anemia Panel: No results for input(s): VITAMINB12, FOLATE, FERRITIN,  TIBC, IRON, RETICCTPCT in the last 72 hours. Sepsis Labs: Recent Labs  Lab 05-Apr-2020 1812 2020/04/05 2303 03/24/20 2141 03/25/20 0115  LATICACIDVEN 2.4* 1.5 2.9* 1.7    Recent Results (from the past 240 hour(s))  Blood Culture (routine x 2)     Status: None (Preliminary result)   Collection Time: 04-05-2020  3:32 PM   Specimen: BLOOD  Result Value Ref Range Status   Specimen Description   Final    BLOOD LEFT ANTECUBITAL Performed at [facility], [address], [place], [place] [address]    Special Requests   Final    BOTTLES DRAWN AEROBIC AND ANAEROBIC Blood Culture results may not be optimal due to an inadequate volume of blood received in culture bottles Performed at [facility], [address], [place], [place] [address]    Culture   Final    NO GROWTH 3 DAYS Performed at [facility], [address] [place], [place] [address]    Report Status PENDING  Incomplete  Respiratory Panel by RT PCR (Flu A&B, Covid) - Nasopharyngeal Swab     Status: None   Collection Time: 2020-04-05  3:35 PM   Specimen: Nasopharyngeal Swab  Result Value Ref Range Status   SARS Coronavirus 2 by RT PCR NEGATIVE NEGATIVE Final    Comment: (NOTE) SARS-CoV-2 target nucleic acids are NOT DETECTED. The SARS-CoV-2 RNA is generally detectable in upper respiratoy specimens during the acute phase of infection. The lowest concentration of SARS-CoV-2 viral copies this assay can detect is 131 copies/mL. A negative result does not preclude SARS-Cov-2 infection and should not be used as the sole basis for treatment or other patient management decisions. A negative result may occur with  improper specimen collection/handling, submission of specimen other than nasopharyngeal swab, presence of viral mutation(s) within the areas targeted by this assay, and inadequate number of viral copies (<131 copies/mL). A negative result must be combined with clinical observations,  patient history, and epidemiological information. The expected result is Negative. Fact Sheet for Patients:  https://www.moore.com/ Fact Sheet for Healthcare Providers:  https://www.young.biz/ This test is not yet ap proved or cleared by the [place] FDA and  has been authorized for detection and/or diagnosis of SARS-CoV-2 by FDA under an Emergency Use Authorization (EUA). This EUA will remain  in effect (meaning this test can be used) for the duration of the COVID-19 declaration under Section 564(b)(1) of the Act, 21 U.S.C. section 360bbb-3(b)(1), unless the authorization is terminated or revoked sooner.    Influenza A by PCR NEGATIVE NEGATIVE Final   Influenza B by PCR NEGATIVE NEGATIVE Final    Comment: (NOTE) The Xpert Xpress SARS-CoV-2/FLU/RSV assay is intended as an aid in  the diagnosis of influenza from Nasopharyngeal  swab specimens and  should not be used as a sole basis for treatment. Nasal washings and  aspirates are unacceptable for Xpert Xpress SARS-CoV-2/FLU/RSV  testing. Fact Sheet for Patients: PinkCheek.be Fact Sheet for Healthcare Providers: GravelBags.it This test is not yet approved or cleared by the [place] FDA and  has been authorized for detection and/or diagnosis of SARS-CoV-2 by  FDA under an Emergency Use Authorization (EUA). This EUA will remain  in effect (meaning this test can be used) for the duration of the  Covid-19 declaration under Section 564(b)(1) of the Act, 21  U.S.C. section 360bbb-3(b)(1), unless the authorization is  terminated or revoked. Performed at [facility], [place] [address], [place], [place] [address]   Blood Culture (routine x 2)     Status: None (Preliminary result)   Collection Time: 04/13/2020  3:37 PM   Specimen: BLOOD RIGHT HAND  Result Value Ref Range Status   Specimen Description   Final    BLOOD  RIGHT HAND Performed at [facility] [address], [place], [place] [address]    Special Requests   Final    BOTTLES DRAWN AEROBIC AND ANAEROBIC Blood Culture adequate volume Performed at [facility] [place] [address], [place], [place] [address]    Culture   Final    NO GROWTH 3 DAYS Performed at [facility], [place] [address], [place], [place] [address]    Report Status PENDING  Incomplete  Urine culture     Status: None   Collection Time: 03/24/20 12:34 AM   Specimen: In/Out Cath Urine  Result Value Ref Range Status   Specimen Description   Final    IN/OUT CATH URINE Performed at [place] [address], [place], [place] [address]    Special Requests   Final    NONE Performed at [facility], [place] [address], [place], [place] [address]    Culture   Final    NO GROWTH Performed at [facility], [place] [address], [place], [place] [address]    Report Status 03/25/2020 FINAL  Final  Surgical pcr screen     Status: None   Collection Time: 03/25/20  7:44 PM   Specimen: Nasal Mucosa; Nasal Swab  Result Value Ref Range Status   MRSA, PCR NEGATIVE NEGATIVE Final   Staphylococcus aureus NEGATIVE NEGATIVE Final    Comment: (NOTE) The Xpert SA Assay (FDA approved for NASAL specimens in patients 17 years of age and older), is one component of a comprehensive surveillance program. It is not intended to diagnose infection nor to guide or monitor treatment. Performed at [facility], [place] [address], [place], [place] [address]          Radiology Studies: IR Perc Cholecystostomy  Result Date: 2020-04-03 INDICATION: 84 year old female with acute calculus cholecystitis. Her condition has deteriorated today concerning for worsening cholecystitis. She is no longer a surgical candidate and presents for placement of an urgent percutaneous cholecystostomy tube. EXAM:  CHOLECYSTOSTOMY MEDICATIONS: 400 mg ciprofloxacin; The antibiotic was administered within an appropriate time frame prior to the initiation of the procedure. ANESTHESIA/SEDATION: Moderate (conscious) sedation was employed during this procedure. A total of Versed 1.5 mg and Fentanyl 50 mcg was administered intravenously. Moderate Sedation Time: 17 minutes. The patient's level of consciousness and vital signs were monitored continuously by radiology nursing throughout the procedure under my direct supervision. FLUOROSCOPY TIME:  Fluoroscopy Time: 0 minutes 36 seconds (9  mGy). COMPLICATIONS: None immediate. PROCEDURE: Informed written consent was obtained from the patient after a thorough discussion of the procedural risks, benefits and alternatives. All questions were addressed. Maximal Sterile Barrier Technique was utilized including caps, mask, sterile gowns, sterile gloves, sterile drape, hand hygiene and skin antiseptic. A timeout was performed prior to the initiation of the procedure. The right upper quadrant was interrogated with ultrasound. The dilated gallbladder was successfully identified. A suitable skin entry site was selected and marked. Local anesthesia was attained by infiltration with 1% lidocaine. A small dermatotomy was made. Under real-time ultrasound guidance, a 21 gauge Chiba needle was advanced over a rib, along a short transhepatic tract and into the gallbladder lumen. There was return of black bile. A 0.018 wire was advanced into the gallbladder. The needle was removed. A hydrophilic transitional dilator was advanced over the wire and into the gallbladder lumen. The wire and inner dilator were removed. Contrast injection was then performed opacifying the gallbladder lumen. Numerous filling defects are present consistent with cholelithiasis. A 0.035 wire was then coiled in the gallbladder lumen. The transitional dilator was removed. The skin tract was then dilated to 10 [place] and a [place] 10.2  [place] all-purpose drainage catheter advanced over the wire and formed in the gallbladder lumen. An image was obtained and stored for the medical record. Aspiration yields dark black bile. No frank purulence. The catheter was gently flushed and connected to gravity bag drainage. The catheter was secured to the skin with 0 Prolene suture and an adhesive fixation device. The patient tolerated the procedure well. IMPRESSION: Successful placement of a 10 French percutaneous transhepatic percutaneous cholecystostomy tube for acute calculus cholecystitis and a non operative candidate. PLAN: Maintain drain to gravity bag drainage. Flush drainage catheter at least once per shift with 5 mL sterile saline. Follow-up cholangiogram at drain clinic in approximately 4 weeks. Signed, Sterling Big, MD, RPVI [facility] Radiology Electronically Signed   By: Malachy Moan M.D.   On: 04/11/20 14:02        Scheduled Meds: . Chlorhexidine Gluconate Cloth  6 each Topical Daily  . enoxaparin (LOVENOX) injection  30 mg Subcutaneous Q24H   Continuous Infusions: . sodium chloride 75 mL/hr at 11-Apr-2020 0219  . sodium chloride 125 mL/hr at 03/27/20 0456  . ceFEPime (MAXIPIME) IV Stopped (Apr 11, 2020 1659)  . metronidazole Stopped (03/27/20 0370)     LOS: 4 days   Time spent:  Azucena Fallen, DO [facility]  If 7PM-7AM, please contact night-coverage [email or web address]  03/27/2020, 7:16 AM

## 2020-03-27 NOTE — Progress Notes (Signed)
Patient is in yellow mews at beginning of shift. No acute change in vital signs noted at this time

## 2020-03-27 NOTE — Consult Note (Signed)
Renal Service Consult Note [name]  [name] 03/27/2020 Sol Blazing Requesting Physician:  Dr Avon Gully  Reason for Consult:  AKI HPI: The patient is a 84 y.o. year-old with hx of systolic CHF, CKD 3, schizophrenia, DM w/ retinopathy, OSA, morbid obesity, HTN, CAD presented on 4/3 w/ abd pain, w/ N/V.  Just dc'd from SNF on 3/29.  Also had chills/ diarrhea. WBC was 20K, creat 1.86. Code sepsis was called and pt rec'd IVF's and IV abx.  Pt was found to have acute cholecystitis and went for perc chole drain per IR on 4/6 yesterday.  Creat up from 1.8 on admit but now is up over 4, associated w/ drop in BP's into the 70's on late 4/5 and early 4/6.  UOP has dropped off. Getting IVF's. Creat today is up from 3.9 > 4.3.  Asked to see for renal failure.   Total I/O's recorded are 5.9 L in and 240 cc UOP out.  Wt's are not approp recorded.   Pt seen in room, she is lying flat, not in distress. Very HOH.  Doesn't respond to most questions.  Gets agitated easily.     Past Medical History  Past Medical History:  Diagnosis Date  . Anxiety   . Arthritis   . Blood transfusion without reported diagnosis   . Chronic diastolic CHF (congestive heart failure) ([place])   . Coronary artery disease    LHC 05/25/06: No renal artery stenosis, proximal LAD 40%, proximal D1 40%  . Diabetes mellitus    a1c 7.3 in 10/2011  . GERD (gastroesophageal reflux disease)   . GLAUCOMA 12/06/2008   Vision Loss right eye   . HYPERLIPIDEMIA 12/27/2006  . Hypertension   . NICM (nonischemic cardiomyopathy) ([place])   . OSA (obstructive sleep apnea) 03/04/2012  . Proliferative diabetic retinopathy 01/28/2009   Severe Disease with Near Complete Blindness.    . Schizophrenia ([place])   . Stage III chronic kidney disease 01/10/2007  . Systolic heart failure ([place])    Echo 10/09: EF 30-35%. Echo 2/13: EF 50-55%.  . Visual hallucinations 07/15/2011   Past Surgical History  Past Surgical History:   Procedure Laterality Date  . EYE SURGERY  2012   both eyes  . IR PERC CHOLECYSTOSTOMY  04/04/2020   Family History  Family History  Problem Relation Age of Onset  . Heart disease Maternal Grandmother   . Prostate cancer Father   . Colon cancer Maternal Aunt    Social History  reports that she has never smoked. She has never used smokeless tobacco. She reports that she does not drink alcohol or use drugs. Allergies  Allergies  Allergen Reactions  . Heparin Other (See Comments)    hallucinations    . Lisinopril Cough   Home medications Prior to Admission medications   Medication Sig Start Date End Date Taking? Authorizing Provider  allopurinol (ZYLOPRIM) 100 MG tablet Take 200 mg by mouth daily with breakfast.    Yes [provider]  aspirin EC 81 MG tablet Take 81 mg by mouth daily.   Yes [provider]  carvedilol (COREG) 6.25 MG tablet Take 6.25 mg by mouth in the morning and at bedtime.   Yes [provider]  Cholecalciferol 1.25 MG (50000 UT) TABS Take 1 tablet by mouth in the morning and at bedtime.   Yes [provider]  furosemide (LASIX) 20 MG tablet Take 20 mg by mouth 2 (two) times daily.   Yes [provider]  potassium chloride (KLOR-CON) 10 MEQ tablet Take 10 mEq by mouth daily.   Yes [provider]  sennosides-docusate sodium (SENOKOT-S) 8.6-50 MG tablet Take 2 tablets by mouth in the morning and at bedtime.    Yes [provider]  atorvastatin (LIPITOR) 20 MG tablet Take 1 tablet (20 mg total) by mouth daily. Patient not taking: Reported on 03/22/2020 12/30/12   Trinda Pascal, MD  docusate sodium 100 MG CAPS Take 100 mg by mouth 2 (two) times daily. Patient not taking: Reported on 04/17/2020 09/13/12   Gae Gallop, MD  ergocalciferol (VITAMIN D2) 50000 UNITS capsule Take 50,000 Units by mouth every Wednesday.     [provider]  potassium chloride (KLOR-CON M15) 15 MEQ tablet Take 2 tablets  (30 mEq total) by mouth once. Patient not taking: Reported on 03/22/2020 10/01/16 10/01/16  Nita Sells, MD     Vitals:   03/27/20 0529 03/27/20 1331 03/27/20 1501 03/27/20 1525  BP: (!) 91/41 (!) 98/56 (!) 108/58 (!) 104/58  Pulse: 76 74 73 73  Resp: '17 18 18 18  '$ Temp: 97.7 F (36.5 C) 98 F (36.7 C) (!) 97.5 F (36.4 C) (!) 97.4 F (36.3 C)  TempSrc:  Oral Axillary Axillary  SpO2: 100% 100% 100% 100%  Weight:      Height:       Exam Gen obese AAF, lying flat, in no distress No rash, cyanosis or gangrene Sclera anicteric, throat clear and moist  No jvd or bruits Chest clear bilat to bases, no rales or wheezing RRR no MRG Abd soft ntnd no mass or ascites +bs very obese GU w/ foley in place draining small amts dark amber urine MS no joint effusions or deformity Ext diffuse 2+ LE and UE edema, no wounds or ulcers Neuro is alert, Ox person only     Home meds:  - coreg 6.25 bid/ lasix 20 bid/ Kdur 30 meq qd  - lipitor 20/ aspirin 81  - allopurinol 200 qd   - prn's/ vitamins/ supplements     UA - not sent    Renal US - not done    ABD CT noncon 4/3 - Kidneys are normal, without renal calculi, focal lesion, or hydronephrosis. Bladder is unremarkable.     Last creat in Epic is 1.5- 1.9 in Oct 2017, eGFR 36  Assessment/ Plan: 1. AoCKD 3 - baseline creat not known, most recent labs are creat 1.5- 1.8 in 2017.  CT abd shows smallish kidneys to my read, no hydro. Need UA, lytes. Cause is likely shock episode from gallbladder infection, which has resolved.  BP's okay. No UOP recorded lately, will check w/ RN. Cath in place. Getting IVF"s and has edema in legs/ arms, but would cont for another 24 hrs or so. Check weights, I/O. Hopefully will recover function soon. 2. Septic shock - resolving 3. Acute cholecystitis - sp perc chole drain 4/6, on IV abx also 4. HTN - bp meds on hold w/ low BP's 5. DNR 6. Anemia - for 1u prbc today 7. Chron diast CHF - no signs of resp  issues      Rob Jonnie Finner  MD 03/27/2020, 4:12 PM  Recent Labs  Lab 03/21/2020 0555 03/27/20 0554  WBC 12.5* 10.4  HGB 8.0* 7.2*   Recent Labs  Lab 04/02/2020 1147 04/04/2020 1147 03/24/20 0550 03/24/20 0550 03/25/20 0115 03/25/20 0115 03/25/2020 0555 04/04/2020 0555 04/14/2020 1210 03/27/20 0554  K 3.8   < > 4.0   < >  4.0   < > 4.0   < > 4.7 4.1  BUN 20   < > 24*   < > 29*   < > 37*   < > 39* 41*  CREATININE 1.86*   < > 2.01*   < > 2.69*   < > 3.94*   < > 4.06* 4.36*  CALCIUM 8.5*  --  7.8*  --  7.6*  --  7.3*  --  7.2* 7.3*   < > = values in this interval not displayed.

## 2020-03-28 ENCOUNTER — Inpatient Hospital Stay (HOSPITAL_COMMUNITY): Payer: Medicare (Managed Care)

## 2020-03-28 DIAGNOSIS — K810 Acute cholecystitis: Secondary | ICD-10-CM | POA: Diagnosis not present

## 2020-03-28 LAB — CULTURE, BLOOD (ROUTINE X 2)
Culture: NO GROWTH
Culture: NO GROWTH
Special Requests: ADEQUATE

## 2020-03-28 LAB — COMPREHENSIVE METABOLIC PANEL
ALT: 19 U/L (ref 0–44)
AST: 62 U/L — ABNORMAL HIGH (ref 15–41)
Albumin: 2.1 g/dL — ABNORMAL LOW (ref 3.5–5.0)
Alkaline Phosphatase: 109 U/L (ref 38–126)
Anion gap: 14 (ref 5–15)
BUN: 46 mg/dL — ABNORMAL HIGH (ref 8–23)
CO2: 14 mmol/L — ABNORMAL LOW (ref 22–32)
Calcium: 7.3 mg/dL — ABNORMAL LOW (ref 8.9–10.3)
Chloride: 111 mmol/L (ref 98–111)
Creatinine, Ser: 4.81 mg/dL — ABNORMAL HIGH (ref 0.44–1.00)
GFR calc Af Amer: 9 mL/min — ABNORMAL LOW (ref >60)
GFR calc non Af Amer: 8 mL/min — ABNORMAL LOW (ref >60)
Glucose, Bld: 72 mg/dL (ref 70–99)
Potassium: 4.9 mmol/L (ref 3.5–5.1)
Sodium: 139 mmol/L (ref 135–145)
Total Bilirubin: 1.2 mg/dL (ref 0.3–1.2)
Total Protein: 4.9 g/dL — ABNORMAL LOW (ref 6.5–8.1)

## 2020-03-28 LAB — TYPE AND SCREEN
ABO/RH(D): O NEG
Antibody Screen: NEGATIVE
Unit division: 0

## 2020-03-28 LAB — CBC
HCT: 30.1 % — ABNORMAL LOW (ref 36.0–46.0)
Hemoglobin: 9.8 g/dL — ABNORMAL LOW (ref 12.0–15.0)
MCH: 31.2 pg (ref 26.0–34.0)
MCHC: 32.6 g/dL (ref 30.0–36.0)
MCV: 95.9 fL (ref 80.0–100.0)
Platelets: 112 10*3/uL — ABNORMAL LOW (ref 150–400)
RBC: 3.14 MIL/uL — ABNORMAL LOW (ref 3.87–5.11)
RDW: 18.4 % — ABNORMAL HIGH (ref 11.5–15.5)
WBC: 9.8 10*3/uL (ref 4.0–10.5)
nRBC: 0 % (ref 0.0–0.2)

## 2020-03-28 LAB — BPAM RBC
Blood Product Expiration Date: 202105072359
ISSUE DATE / TIME: 202104071502
Unit Type and Rh: 9500

## 2020-03-28 MED ORDER — SODIUM CHLORIDE 0.9% FLUSH
5.0000 mL | Freq: Three times a day (TID) | INTRAVENOUS | Status: DC
  Administered 2020-03-28 – 2020-04-01 (×14): 5 mL

## 2020-03-28 NOTE — Progress Notes (Signed)
2 Days Post-Op    CC: Right upper quadrant pain  Subjective: She is doing better from our standpoint, IR drain working well.  She is mostly bedbound, so she has not been up.  No real abdominal complaints  Objective: Vital signs in last 24 hours: Temp:  [97.4 F (36.3 C)-98.7 F (37.1 C)] 98.7 F (37.1 C) (04/08 0411) Pulse Rate:  [72-80] 73 (04/08 0411) Resp:  [18] 18 (04/08 0411) BP: (98-123)/(53-71) 106/53 (04/08 0411) SpO2:  [99 %-100 %] 99 % (04/08 0411) Weight:  [95.9 kg] 95.9 kg (04/08 0450) Last BM Date: 04/16/2020 150 p.o. recorded 2900 IV 140 urine recorded No emesis recorded Drain 110 BM x2 Afebrile blood pressure improving, and stable. Creatinine continues to rise 4.81 today. Intake/Output from previous day: 04/07 0701 - 04/08 0700 In: 3097.6 [P.O.:150; I.V.:2215.6; Blood:345; IV Piggyback:387] Out: 250 [Urine:140; Drains:110] Intake/Output this shift: No intake/output data recorded.  General appearance: alert, cooperative and no distress Resp: clear to auscultation bilaterally GI: less tender and eating per daughter.    Lab Results:  Recent Labs    03/27/20 0554 03/28/20 0615  WBC 10.4 9.8  HGB 7.2* 9.8*  HCT 22.3* 30.1*  PLT 114* 112*    BMET Recent Labs    03/27/20 0554 03/28/20 0615  NA 138 139  K 4.1 4.9  CL 111 111  CO2 18* 14*  GLUCOSE 93 72  BUN 41* 46*  CREATININE 4.36* 4.81*  CALCIUM 7.3* 7.3*   PT/INR No results for input(s): LABPROT, INR in the last 72 hours.  Recent Labs  Lab 03/24/20 0550 03/25/20 0115 Apr 21, 2020 0555 03/27/20 0554 03/28/20 0615  AST 48* 46* 46* 48* 62*  ALT 43 38 27 21 19   ALKPHOS 54 63 78 81 109  BILITOT 1.4* 1.5* 1.4* 0.9 1.2  PROT 5.0* 5.1* 5.1* 4.7* 4.9*  ALBUMIN 2.3* 2.4* 2.5* 2.1* 2.1*     Lipase     Component Value Date/Time   LIPASE 23 04/10/2020 1147     Medications: . Chlorhexidine Gluconate Cloth  6 each Topical Daily  . enoxaparin (LOVENOX) injection  30 mg Subcutaneous Q24H   . sodium chloride flush  5 mL Intracatheter Q8H   . sodium chloride 75 mL/hr at 03/28/20 0600  . ceFEPime (MAXIPIME) IV Stopped (03/27/20 1831)  . metronidazole Stopped (03/28/20 05/28/20)    Assessment/Plan HTN HLD CKD3  - Creatinine 1.86>>2.01>>2.69>>3.94 >>4.36(4/7)>>4.81(4/8) Chronic diastolic CHF -Echo shows EF 60 to 65% no significant valvular abnormality, patient is moderate risk but not prohibitive for cholecystectomy Hypotension - improving Anemia   - H/H 8.5/26.5>>7.9/25.2>>8.0/24.8>>7.2/22.3(4/7) >>9.8/30.1  - transfused 4/7 DNR Blind  Acute cholecystitis - total bilirubin1.4>>1.4>>1.5>>1.4>>0.9  - WBC 20.7>>22.4>>20.0>>12.5>>10.4(4/7)>>9.8(4/8) IR drain placement Apr 21, 2020 ID -maxipime/flagyl 4/3>> FEN -IVF, ? NPO VTE -lovenox Foley -none Follow up -TBD   Plan:  From our standpoint she could go home.  Her other medical issue will preclude that.  I will leave follow up information on the chart for our office.  IR will set her up for follow up in 5-6 weeks, we will have her come back in 6 weeks to see Dr. 05/26/20.  Drain care per IR.       LOS: 5 days    Jazari Ober 03/28/2020 Please see Amion

## 2020-03-28 NOTE — Discharge Instructions (Signed)
Cholecystitis  Cholecystitis is irritation and swelling (inflammation) of the gallbladder. The gallbladder is an organ that is shaped like a pear. It is under the liver on the right side of the body. This organ stores bile. Bile helps the body break down (digest) the fats in food. This condition can occur all of a sudden. It needs to be treated. What are the causes? This condition may be caused by stones or lumps that form in the gallbladder (gallstones). Gallstones can block the tube (duct) that carries bile out of your gallbladder. Other causes are:  Damage to the gallbladder due to less blood flow.  Germs in the bile ducts.  Scars or kinks in the bile ducts.  Abnormal growths (tumors) in the liver, pancreas, or gallbladder. What increases the risk? You are more likely to develop this condition if:  You have sickle cell disease.  You take birth control pills.  You use estrogen.  You have alcoholic liver disease.  You have liver cirrhosis.  You are being fed through a vein.  You are very ill.  You do not eat or drink for a long time. This is also called "fasting."  You are overweight (obese).  You lose weight too fast.  You are pregnant.  You have high levels of fat in the blood (triglycerides).  You have irritation and swelling of the pancreas (pancreatitis). What are the signs or symptoms? Symptoms of this condition include:  Pain in the belly (abdomen). Pain is often in the upper right area of the belly.  Tenderness or bloating in the belly.  Feeling sick to your stomach (nauseous).  Throwing up (vomiting).  Fever.  Chills. How is this diagnosed? This condition may be diagnosed with a medical history and exam. You may also have other tests, such as:  Imaging tests. This may include: ? Ultrasound. ? CT scan of the belly. ? Nuclear scan. This is also called a HIDA scan. This scan lets your doctor see the bile as it moves in your body. ? MRI.  Blood  tests. These are done to check: ? Your blood count. The white blood cell count may be higher than normal. ? How well your liver works. How is this treated? This condition may be treated with:  Surgery to take out your gallbladder.  Antibiotic medicines to treat illnesses caused by germs.  Going without food for some time.  Giving fluids through an IV tube.  Medicines to treat pain or throwing up. Follow these instructions at home:  If you had surgery, follow instructions from your doctor about how to care for yourself after you go home. Medicines   Take over-the-counter and prescription medicines only as told by your doctor.  If you were prescribed an antibiotic medicine, take it as told by your doctor. Do not stop taking it even if you start to feel better. General instructions  Follow instructions from your doctor about what to eat or drink. Do not eat or drink anything that makes you sick again.  Do not lift anything that is heavier than 10 lb (4.5 kg) until your doctor says that it is safe.  Do not use any products that contain nicotine or tobacco, such as cigarettes and e-cigarettes. If you need help quitting, ask your doctor.  Keep all follow-up visits as told by your doctor. This is important. Contact a doctor if:  You have pain and your medicine does not help.  You have a fever. Get help right away if:  Your pain moves to: ? Another part of your belly. ? Your back.  Your symptoms do not go away.  You have new symptoms. Summary  Cholecystitis is swelling and irritation of the gallbladder.  This condition may be caused by stones or lumps that form in the gallbladder (gallstones).  Common symptoms are pain in the belly. You may feel sick to your stomach and start throwing up. You may also have a fever and chills.  This condition may be treated with surgery to take out the gallbladder. It may also be treated with medicines, fasting, and fluids through an IV  tube.  Follow what you are told about eating and drinking. Do not eat things that make you sick again. This information is not intended to replace advice given to you by your health care provider. Make sure you discuss any questions you have with your health care provider. Document Revised: 04/15/2018 Document Reviewed: 04/15/2018 Elsevier Patient Education  2020 [employer]   Biliary Drainage Catheter Home Guide A biliary drainage catheter is a thin, flexible tube that is inserted through your skin into the bile ducts in your liver. Bile is a thick yellow or green fluid that helps digest fat in foods. The purpose of a biliary drainage catheter is to keep bile from backing up into your liver. Backup of bile can occur when there is a blockage that prevents bile from moving from the bile ducts into the small intestine as it should. The blockage can be caused by gallstones, a tumor, or scar tissue. There are three types of biliary drainage:  External biliary drainage. With this type, bile is only drained into a collection bag outside your body (external collection bag).  Internal-external biliary drainage. With this type, bile is drained to an external collection bag as well as into your small intestine.  Internal biliary drainage. With this type, bile is only drained into your small intestine. General home care includes these daily actions:   Inspection of your drainage catheter.  Flushing your drainage catheter with saline.  Emptying drainage from the collection bag (if present).  Recording the amount of drainage.  Checking the catheter insertion site for signs of infection. Check for: ? Redness, swelling, or pain. ? Fluid or blood. ? Warmth. ? Pus or a bad smell. How do I inspect my drainage catheter?  Check the dressing to make sure that it is dry and clean.  Look at the skin around the drainage catheter when changing the dressing for any problems such as redness, rash, or  skin breakdown.  Check the drainage bag to make sure that drainage fluid is flowing into the bag well. Note the color and amount compared to other days.  Check the drainage catheter and bag for any cracks or kinks in the tubing. How do I change my dressing? The dressing over the drainage catheter should be changed every other day, or more often if needed to keep the dressing dry. Your health care provider will instruct you about how often to change your dressing. Supplies needed:  Mild soap and warm water.  Split gauze pads, 4 x 4 inches (10 x 10 cm) to use as a dressing sponge.  Gauze pads, 4 x 4 inches (10 x 10 cm) or adhesive dressing cover.  Paper tape. How to change the dressing: 1. Wash your hands with soap and water. 2. Gently remove the old dressing. Avoid using scissors to remove the dressing because they may damage the drainage catheter. 3.  Wash the skin around the insertion site with mild soap and warm water, rinse well, then pat the area dry with a clean cloth. 4. Check the skin around the drainage catheter for redness or swelling, or for yellow or green discharge that has a bad smell. 5. If the drainage catheter was stitched (sutured) to the skin, inspect the suture to make sure it is still anchored in the skin. 6. Do not apply creams, ointments, or alcohol to the site. Allow the skin to air-dry completely before you apply a new dressing. 7. Place the drainage catheter through the slit in a dressing sponge. The dressing sponge should slide under the disk that holds the drainage catheter in place. 8. Cover the drainage catheter and the dressing sponge with a 4 x 4 inch (10 x 10 cm) gauze. The drainage catheter should rest on the gauze and not on the skin. 9. Tape the dressing to the skin. 10. You may be instructed to use an adhesive dressing covering over the top of this in place of the gauze and tape. 11. Wash your hands with soap and water. How do I flush my drainage  catheter? Biliary drainagecatheters should be flushed daily, or as often as told by your health care provider. The end of the drainage catheter is closed using an IV cap. A syringe can be directly connected to the IV cap. Supplies needed:  Alcohol swab.  10 mL prefilled normal saline syringe. How to flush the drainage catheter: 1. Wash your hands with soap and water. 2. If your drainage catheter has a stopcock attached to it, turn the stopcock toward the drainage bag. This will allow the saline to flow in the direction of your body. 3. Clean the IV cap with an alcohol swab. 4. Screw the tip of a 10 mL normal saline syringe onto the IV cap. 5. Inject the saline over 5-10 seconds. If you feel resistance while injecting, stop immediately. Avoid  pulling back on the plunger. Doing that could increase your risk of infection. 6. Remove the syringe from the cap. Turn the stopcock so that fluid flows from your body into the drainage bag. You may notice more fluid flowing into the bag after you have completed the flush. How do I attach a bag to my drainage catheter? If you are having trouble with your internal biliary drain, you may be directed by your health care provider to use bag drainage until you can be seen to fix the problem. For this reason, you should always have a collection bag and connecting tubing at home. If you do not have these supplies, remember to ask for them at your next appointment. 1. Remove the bag and the connecting tubing from their packaging. 2. Connect the funnel end of the tubing to the bag's cone-shaped stem. 3. Remove the IV cap from the biliary drain. To do this, unscrew it and replace it with the screw-on end of the tubing. 4. Save the IV cap in a plastic storage bag that can be sealed. How do I empty my collection bag? Empty the collection bag whenever it becomes 2/3 full. Also empty it before you go to sleep. Most collection bags have a drainage valve at the bottom so  the bag can be that allows them to be emptied easily. 1. Wash your hands with soap and water. 2. Hold the collection bag over the toilet, basin, or collection container. Use a measuring container if your health care provider told you to measure  the drainage. 3. Unscrew the valve to open it, and allow the bag to drain. 4. Close the valve securely to avoid leakage. 5. Use a tissue or disposable napkin to wipe the valve clean. 6. Wash the measuring container with soap and water. 7. Record the amount of drainage as told by your health care provider. Contact a health care provider if:  Your pain gets worse after it had improved, and it is not relieved with pain medicines.  You have any questions about caring for your drainage catheter or collection bag.  You have any of these around your catheter insertion site or coming from it: ? Skin breakdown. ? Redness, swelling, or pain. ? Fluid or blood. ? Warmth to the touch. ? Pus or a bad smell. Get help right away if:  You have a fever or chills.  Your redness, swelling, or pain at the catheter insertion site gets worse, even though you are cleaning it well.  You have leakage of bile around the drainage catheter.  Your drainage catheter becomes blocked or clogged.  Your drainage catheter comes out. This information is not intended to replace advice given to you by your health care provider. Make sure you discuss any questions you have with your health care provider. Document Revised: 11/19/2017 Document Reviewed: 10/26/2016 Elsevier Patient Education  2020 [employer].

## 2020-03-28 NOTE — Progress Notes (Addendum)
PROGRESS NOTE    [name]  TDV:[id number] DOB: September 22, 1936 DOA: 03/31/2020 PCP: Jethro Bastos, MD   Brief Narrative:  [name] is a 84 y.o. female with medical history significant of clinically blind, diabetes which is diet controlled, hypertension, dCHF, CKD stage IIIb and remote history of pancreatitis who [facility], presented to the emergency department complaining of RUQ abdominal pain and vomiting for 1 day.  Patient reported after having her meal she developed severe abdominal pain, which was sharp in nature.  And started vomiting.  Later on she developed chills and had diarrhea.  Patient denies chest pain, shortness of breath, dizziness, weakness and cough. In ED: Vitals stable, CT of the abdomen revealed possible acute cholecystitis and cholelithiasis. White count 20 K with left shift, hemoglobin 10.6, creatinine of 1.86, this and total bili minimally elevated.  Renal ultrasound pending.  ER physician called as a code sepsis with sofa score of 2. Surgical team was consulted which recommended to admit to medicine and treat with antibiotic for now.  Subjective: No acute issues or events overnight, patient's creatinine continues to climb.  She continues to be somewhat difficult to interview given her hearing impairment but continue to decline chest pain, shortness of breath, nausea, vomiting; she no longer endorses abdominal pain even with palpation.  Assessment & Plan:   Active Problems:   Acute cholecystitis   Coronary artery disease involving native coronary artery of native heart without angina pectoris   Acute calculous cholecystitis   Acute kidney injury superimposed on chronic kidney disease (HCC)   Chronic diastolic CHF (congestive heart failure) (HCC)   Sepsis due to acute cholecystitis, present on admission, ongoing. - General surgery previously following, status post percutaneous chole drain 04/24/2020 - will need to remain in place for  4 to 6 weeks unless cholecystectomy can be done in the interim per IR. Will need follow up in 6 weeks to see Dr. Donell Beers - Continue cefepime and Flagyl for coverage of abdomen - Currently very poor p.o. intake prior to admission - continue IV fluids, advance diet per surgical recommendations  Acute on CKD stage IIIb  with worsening urine output, anuric at this point, ongoing - Poor p.o. intake in the setting of above likely exacerbating CKD 3B  -Creatinine continues to worsen despite IV fluids, nephrology following, appreciate insight and recommendations -Currently continues on IV fluids, hoping to advance patient's diet currently on clears - Unclear if this is simply prerenal due to poor volume status or worsening intra-abdominal infection/sepsis - Baseline appears to be around 1.5 per chart review - Foley catheter to follow UOP more accurately -essentially anuric over the past 12 hours Lab Results  Component Value Date   CREATININE 4.81 (H) 03/28/2020   CREATININE 4.36 (H) 03/27/2020   CREATININE 4.06 (H) 24-Apr-2020   HTN, essential - currently hypotensive - Discontinue carvedilol, Lasix previously on hold given AKI - Follow clinically, blood pressure minimally improving with IV fluids and advancement of diet - Patient's chronic blood pressure appears to be around 100/60 per chart review - Continue IV fluids in the setting of poor p.o. intake - multiple boluses now with minimal change  Anemia, likely acute hemodilutional anemia on chronic anemia of chronic disease -Transfused 1 unit PRBC 03/27/2020 given worsening anemia, renal function, volume status - Hemoglobin improved more drastically than expected CBC Latest Ref Rng & Units 03/28/2020 03/27/2020 2020/04/24  WBC 4.0 - 10.5 K/uL 9.8 10.4 12.5(H)  Hemoglobin 12.0 - 15.0 g/dL  9.8(L) 7.2(L) 8.0(L)  Hematocrit 36.0 - 46.0 % 30.1(L) 22.3(L) 24.8(L)  Platelets 150 - 400 K/uL 112(L) 114(L) 106(L)   Chronic diastolic CHF  - Appears somewhat  volume overloaded today, hold Lasix given AKI as above  - Cardiology following at the request of surgery, echocardiogram as below - Fluids cautiously as above - monitor for respiratory decline  DVT prophylaxis: Lovenox   Code Status: DNR  Family Communication: Daughter [name] updated at bedside Disposition Plan: Unclear position status, discussion with [name] indicates likely discharge to SNF if reasonable however given patient's worsening status we discussed that given her advanced age, worsening creatinine, worsening urine output, low blood pressure, infection and advanced age with multiple chronic comorbid conditions if she does not continue to improve over the next few days we would likely need to discuss palliative care in hopes to keep the patient comfortable which is a primary goal for the family..  Admission status: Inpatient, Medsurg -continues to require further monitoring in the setting of sepsis, acute cholecystitis, hypotension, requiring IV fluids, IV antibiotics and IV pain medication.  Consultants:   General Surgery  Cardiology  IR  Nephrology  Procedures:   IR placed perc drain of gallbladder 04/13/2020  Antimicrobials:  Cefepime, Flagyl  Objective: Vitals:   03/27/20 2100 03/28/20 0014 03/28/20 0411 03/28/20 0450  BP: (!) 114/59 (!) 119/57 (!) 106/53   Pulse: 77 72 73   Resp: 18 18 18    Temp: 98.3 F (36.8 C) 98.2 F (36.8 C) 98.7 F (37.1 C)   TempSrc: Axillary Axillary Axillary   SpO2: 100% 100% 99%   Weight:    95.9 kg  Height:        Intake/Output Summary (Last 24 hours) at 03/28/2020 0716 Last data filed at 03/28/2020 0600 Gross per 24 hour  Intake 3097.62 ml  Output 250 ml  Net 2847.62 ml   Filed Weights   04/19/2020 1153 03/28/20 0450  Weight: 85.7 kg 95.9 kg    Examination:  General:  Pleasantly resting in bed, No acute distress.  HEENT:  Normocephalic atraumatic.  Sclerae nonicteric, noninjected.  Extraocular movements intact  bilaterally. Neck:  Without mass or deformity.  Trachea is midline. Lungs:  Clear to auscultate bilaterally without rhonchi, wheeze, or rales. Heart:  Regular rate and rhythm.  Without murmurs, rubs, or gallops. Abdomen:  Soft, moderate tenderness generally but PMI right upper quadrant Extremities: Bilateral upper extremities 1+ pitting edema distally, lower extremities scant to 1+ pitting edema. Vascular:  Dorsalis pedis and posterior tibial pulses palpable bilaterally. Skin:  Warm and dry, no erythema, no ulcerations.   Data Reviewed: I have personally reviewed following labs and imaging studies  CBC: Recent Labs  Lab 04/03/2020 1147 04/17/2020 1147 03/24/20 0550 03/25/20 0115 Apr 13, 2020 0555 03/27/20 0554 03/28/20 0615  WBC 20.7*   < > 22.4* 20.0* 12.5* 10.4 9.8  NEUTROABS 17.7*  --   --   --   --   --   --   HGB 10.6*   < > 8.5* 7.9* 8.0* 7.2* 9.8*  HCT 33.8*   < > 26.5* 25.2* 24.8* 22.3* 30.1*  MCV 105.6*   < > 105.2* 106.3* 105.1* 105.7* 95.9  PLT 154   < > 119* 114* 106* 114* 112*   < > = values in this interval not displayed.   Basic Metabolic Panel: Recent Labs  Lab 03/25/20 0115 13-Apr-2020 0555 13-Apr-2020 1210 03/27/20 0554 03/28/20 0615  NA 135 137 136 138 139  K 4.0 4.0 4.7 4.1  4.9  CL 105 109 106 111 111  CO2 22 20* 19* 18* 14*  GLUCOSE 122* 78 73 93 72  BUN 29* 37* 39* 41* 46*  CREATININE 2.69* 3.94* 4.06* 4.36* 4.81*  CALCIUM 7.6* 7.3* 7.2* 7.3* 7.3*   GFR: Estimated Creatinine Clearance: 10.1 mL/min (A) (by C-G formula based on SCr of 4.81 mg/dL (H)). Liver Function Tests: Recent Labs  Lab 03/24/20 0550 03/25/20 0115 04/10/2020 0555 03/27/20 0554 03/28/20 0615  AST 48* 46* 46* 48* 62*  ALT 43 38 27 21 19   ALKPHOS 54 63 78 81 109  BILITOT 1.4* 1.5* 1.4* 0.9 1.2  PROT 5.0* 5.1* 5.1* 4.7* 4.9*  ALBUMIN 2.3* 2.4* 2.5* 2.1* 2.1*   Recent Labs  Lab 03/28/2020 1147  LIPASE 23   No results for input(s): AMMONIA in the last 168 hours. Coagulation  Profile: Recent Labs  Lab 04/02/2020 1532  INR 1.2   Cardiac Enzymes: No results for input(s): CKTOTAL, CKMB, CKMBINDEX, TROPONINI in the last 168 hours. BNP (last 3 results) No results for input(s): PROBNP in the last 8760 hours. HbA1C: No results for input(s): HGBA1C in the last 72 hours. CBG: Recent Labs  Lab 03/24/20 2139 03/25/20 0600  GLUCAP 142* 94   Lipid Profile: No results for input(s): CHOL, HDL, LDLCALC, TRIG, CHOLHDL, LDLDIRECT in the last 72 hours. Thyroid Function Tests: No results for input(s): TSH, T4TOTAL, FREET4, T3FREE, THYROIDAB in the last 72 hours. Anemia Panel: No results for input(s): VITAMINB12, FOLATE, FERRITIN, TIBC, IRON, RETICCTPCT in the last 72 hours. Sepsis Labs: Recent Labs  Lab 03/22/2020 1812 04/14/2020 2303 03/24/20 2141 03/25/20 0115  LATICACIDVEN 2.4* 1.5 2.9* 1.7    Recent Results (from the past 240 hour(s))  Blood Culture (routine x 2)     Status: None   Collection Time: 04/19/2020  3:32 PM   Specimen: BLOOD  Result Value Ref Range Status   Specimen Description   Final    BLOOD LEFT ANTECUBITAL Performed at [facility], [address], [place], [place] [address]    Special Requests   Final    BOTTLES DRAWN AEROBIC AND ANAEROBIC Blood Culture results may not be optimal due to an inadequate volume of blood received in culture bottles Performed at [facility], [address] [place], [place] [address]    Culture   Final    NO GROWTH 5 DAYS Performed at [facility], [address], [place], [place] [address]    Report Status 03/28/2020 FINAL  Final  Respiratory Panel by RT PCR (Flu A&B, Covid) - Nasopharyngeal Swab     Status: None   Collection Time: 03/31/2020  3:35 PM   Specimen: Nasopharyngeal Swab  Result Value Ref Range Status   SARS Coronavirus 2 by RT PCR NEGATIVE NEGATIVE Final    Comment: (NOTE) SARS-CoV-2 target nucleic acids are NOT DETECTED. The SARS-CoV-2 RNA is  generally detectable in upper respiratoy specimens during the acute phase of infection. The lowest concentration of SARS-CoV-2 viral copies this assay can detect is 131 copies/mL. A negative result does not preclude SARS-Cov-2 infection and should not be used as the sole basis for treatment or other patient management decisions. A negative result may occur with  improper specimen collection/handling, submission of specimen other than nasopharyngeal swab, presence of viral mutation(s) within the areas targeted by this assay, and inadequate number of viral copies (<131 copies/mL). A negative result must be combined with clinical observations, patient history, and epidemiological information. The expected  result is Negative. Fact Sheet for Patients:  https://www.moore.com/ Fact Sheet for Healthcare Providers:  https://www.young.biz/ This test is not yet ap proved or cleared by the [place] FDA and  has been authorized for detection and/or diagnosis of SARS-CoV-2 by FDA under an Emergency Use Authorization (EUA). This EUA will remain  in effect (meaning this test can be used) for the duration of the COVID-19 declaration under Section 564(b)(1) of the Act, 21 U.S.C. section 360bbb-3(b)(1), unless the authorization is terminated or revoked sooner.    Influenza A by PCR NEGATIVE NEGATIVE Final   Influenza B by PCR NEGATIVE NEGATIVE Final    Comment: (NOTE) The Xpert Xpress SARS-CoV-2/FLU/RSV assay is intended as an aid in  the diagnosis of influenza from Nasopharyngeal swab specimens and  should not be used as a sole basis for treatment. Nasal washings and  aspirates are unacceptable for Xpert Xpress SARS-CoV-2/FLU/RSV  testing. Fact Sheet for Patients: https://www.moore.com/ Fact Sheet for Healthcare Providers: https://www.young.biz/ This test is not yet approved or cleared by the [place] FDA and   has been authorized for detection and/or diagnosis of SARS-CoV-2 by  FDA under an Emergency Use Authorization (EUA). This EUA will remain  in effect (meaning this test can be used) for the duration of the  Covid-19 declaration under Section 564(b)(1) of the Act, 21  U.S.C. section 360bbb-3(b)(1), unless the authorization is  terminated or revoked. Performed at [facility], [address] [place], [place] [address]   Blood Culture (routine x 2)     Status: None   Collection Time: 03/22/2020  3:37 PM   Specimen: BLOOD RIGHT HAND  Result Value Ref Range Status   Specimen Description   Final    BLOOD RIGHT HAND Performed at [facility], [address] [place], [place] [address]    Special Requests   Final    BOTTLES DRAWN AEROBIC AND ANAEROBIC Blood Culture adequate volume Performed at [facility], [address] [place], [place] [address]    Culture   Final    NO GROWTH 5 DAYS Performed at [facility], [address] [place], [place] [address]    Report Status 03/28/2020 FINAL  Final  Urine culture     Status: None   Collection Time: 03/24/20 12:34 AM   Specimen: In/Out Cath Urine  Result Value Ref Range Status   Specimen Description   Final    IN/OUT CATH URINE Performed at [facility], [address], [place], [place] [address]    Special Requests   Final    NONE Performed at [facility], [address] [place], [place] [address]    Culture   Final    NO GROWTH Performed at [facility], [address], [place], [place] [address]    Report Status 03/25/2020 FINAL  Final  Surgical pcr screen     Status: None   Collection Time: 03/25/20  7:44 PM   Specimen: Nasal Mucosa; Nasal Swab  Result Value Ref Range Status   MRSA, PCR NEGATIVE NEGATIVE Final   Staphylococcus aureus NEGATIVE NEGATIVE Final    Comment: (NOTE) The Xpert SA Assay (FDA approved for  NASAL specimens in patients 24 years of age and older), is one component of a comprehensive surveillance program. It is not intended to diagnose infection nor to guide or monitor treatment. Performed at [facility], [address], [place], [place] [address]   Body  fluid culture     Status: None (Preliminary result)   Collection Time: 03/27/20 11:53 AM   Specimen: BILE; Body Fluid  Result Value Ref Range Status   Specimen Description   Final    BILE Performed at [facility], [address], [place], [place] [address]    Special Requests NONE  Final   Gram Stain   Final    NO WBC SEEN NO ORGANISMS SEEN Performed at [facility], [address] [place], [place] [address]    Culture PENDING  Incomplete   Report Status PENDING  Incomplete         Radiology Studies: IR Perc Cholecystostomy  Result Date: 04/07/2020 INDICATION: 84 year old female with acute calculus cholecystitis. Her condition has deteriorated today concerning for worsening cholecystitis. She is no longer a surgical candidate and presents for placement of an urgent percutaneous cholecystostomy tube. EXAM: CHOLECYSTOSTOMY MEDICATIONS: 400 mg ciprofloxacin; The antibiotic was administered within an appropriate time frame prior to the initiation of the procedure. ANESTHESIA/SEDATION: Moderate (conscious) sedation was employed during this procedure. A total of Versed 1.5 mg and Fentanyl 50 mcg was administered intravenously. Moderate Sedation Time: 17 minutes. The patient's level of consciousness and vital signs were monitored continuously by radiology nursing throughout the procedure under my direct supervision. FLUOROSCOPY TIME:  Fluoroscopy Time: 0 minutes 36 seconds (9 mGy). COMPLICATIONS: None immediate. PROCEDURE: Informed written consent was obtained from the patient after a thorough discussion of the procedural risks, benefits and alternatives. All questions were addressed.  Maximal Sterile Barrier Technique was utilized including caps, mask, sterile gowns, sterile gloves, sterile drape, hand hygiene and skin antiseptic. A timeout was performed prior to the initiation of the procedure. The right upper quadrant was interrogated with ultrasound. The dilated gallbladder was successfully identified. A suitable skin entry site was selected and marked. Local anesthesia was attained by infiltration with 1% lidocaine. A small dermatotomy was made. Under real-time ultrasound guidance, a 21 gauge Chiba needle was advanced over a rib, along a short transhepatic tract and into the gallbladder lumen. There was return of black bile. A 0.018 wire was advanced into the gallbladder. The needle was removed. A hydrophilic transitional dilator was advanced over the wire and into the gallbladder lumen. The wire and inner dilator were removed. Contrast injection was then performed opacifying the gallbladder lumen. Numerous filling defects are present consistent with cholelithiasis. A 0.035 wire was then coiled in the gallbladder lumen. The transitional dilator was removed. The skin tract was then dilated to 10 [place] and a Cook 10.2 [place] all-purpose drainage catheter advanced over the wire and formed in the gallbladder lumen. An image was obtained and stored for the medical record. Aspiration yields dark black bile. No frank purulence. The catheter was gently flushed and connected to gravity bag drainage. The catheter was secured to the skin with 0 Prolene suture and an adhesive fixation device. The patient tolerated the procedure well. IMPRESSION: Successful placement of a 10 French percutaneous transhepatic percutaneous cholecystostomy tube for acute calculus cholecystitis and a non operative candidate. PLAN: Maintain drain to gravity bag drainage. Flush drainage catheter at least once per shift with 5 mL sterile saline. Follow-up cholangiogram at drain clinic in approximately 4 weeks. Signed, Sterling Big, MD, RPVI [facility] Radiology Electronically Signed   By: Malachy Moan M.D.   On: 04/11/2020 14:02   Scheduled Meds: . Chlorhexidine Gluconate Cloth  6 each Topical Daily  . enoxaparin (LOVENOX) injection  30 mg Subcutaneous Q24H  . sodium chloride flush  5 mL Intracatheter Q8H   Continuous Infusions: . sodium chloride 75 mL/hr at 03/28/20 0600  . ceFEPime (MAXIPIME) IV Stopped (03/27/20 1831)  . metronidazole Stopped (03/28/20 7412)     LOS: 5 days   Time spent: 68min  Kenyette Gundy C Tor Tsuda, DO [facility]  If 7PM-7AM, please contact night-coverage [email or web address]  03/28/2020, 7:16 AM

## 2020-03-28 NOTE — Progress Notes (Signed)
[facility] Progress Note  Subjective: poor UOP 150 cc yest  Vitals:   03/28/20 1013 03/28/20 1428 03/28/20 1445 03/28/20 1445  BP: (!) 120/48 91/70  (!) 96/58  Pulse: 75 77  75  Resp: '18 18  14  '$ Temp: (!) 96 F (35.6 C) (!) 96.1 F (35.6 C)  97.6 F (36.4 C)  TempSrc:   Axillary Oral  SpO2: 98% 100%  100%  Weight:      Height:        Exam: Gen obese AAF, lying flat, in no distress HOH, responds to loud voice, O x1 No jvd or bruits Chest clear bilat to bases RRR no MRG Abd soft ntnd no mass or ascites +bs very obese GU w/ foley in place w/ small amt amber urine Ext diffuse 2+ LE and UE edema Neuro is alert, Ox person only     Home meds:  - coreg 6.25 bid/ lasix 20 bid/ Kdur 30 meq qd  - lipitor 20/ aspirin 81  - allopurinol 200 qd   - prn's/ vitamins/ supplements     UA - not sent    Renal US - not done    ABD CT noncon 4/3 - Kidneys are normal, without renal calculi, focal lesion, or hydronephrosis. Bladder is unremarkable.     Last creat in Epic is 1.5- 1.9 in Oct 2017, eGFR 36  Assessment/ Plan: 1. AoCKD 3 - baseline creat not known, most recent labs are creat 1.5- 1.8 in 2017.  CT abd shows smallish kidneys to my read, no hydro. Need UA, lytes. Cause is likely shock episode from gallbladder infection, which has resolved.  BP's okay now. Unfortunately renal function not improving yet. UOP poor. Pt is poor HD candidate given comorbidities. Cont supportive care.  2. Septic shock - resolving 3. Acute cholecystitis - sp perc chole drain 4/6, on IV abx also 4. HTN/ vol  - sig edema on exam, bp meds on hold w/ soft BP's 5. DNR 6. Anemia - sp prbc's 7. Chron diast CHF - no signs of resp issues     Rob Loria Lacina 03/28/2020, 4:57 PM   Recent Labs  Lab 03/27/20 0554 03/28/20 0615  K 4.1 4.9  BUN 41* 46*  CREATININE 4.36* 4.81*  CALCIUM 7.3* 7.3*  HGB 7.2* 9.8*   Inpatient medications: . Chlorhexidine Gluconate Cloth  6 each Topical Daily   . enoxaparin (LOVENOX) injection  30 mg Subcutaneous Q24H  . sodium chloride flush  5 mL Intracatheter Q8H   . ceFEPime (MAXIPIME) IV 1 g (03/28/20 1625)  . metronidazole 500 mg (03/28/20 0939)   HYDROcodone-acetaminophen, ondansetron **OR** ondansetron (ZOFRAN) IV

## 2020-03-29 DIAGNOSIS — K810 Acute cholecystitis: Secondary | ICD-10-CM | POA: Diagnosis not present

## 2020-03-29 LAB — COMPREHENSIVE METABOLIC PANEL
ALT: 21 U/L (ref 0–44)
AST: 61 U/L — ABNORMAL HIGH (ref 15–41)
Albumin: 2 g/dL — ABNORMAL LOW (ref 3.5–5.0)
Alkaline Phosphatase: 125 U/L (ref 38–126)
Anion gap: 18 — ABNORMAL HIGH (ref 5–15)
BUN: 47 mg/dL — ABNORMAL HIGH (ref 8–23)
CO2: 14 mmol/L — ABNORMAL LOW (ref 22–32)
Calcium: 7.7 mg/dL — ABNORMAL LOW (ref 8.9–10.3)
Chloride: 109 mmol/L (ref 98–111)
Creatinine, Ser: 5.09 mg/dL — ABNORMAL HIGH (ref 0.44–1.00)
GFR calc Af Amer: 9 mL/min — ABNORMAL LOW (ref >60)
GFR calc non Af Amer: 7 mL/min — ABNORMAL LOW (ref >60)
Glucose, Bld: 67 mg/dL — ABNORMAL LOW (ref 70–99)
Potassium: 4.4 mmol/L (ref 3.5–5.1)
Sodium: 141 mmol/L (ref 135–145)
Total Bilirubin: 1 mg/dL (ref 0.3–1.2)
Total Protein: 5.1 g/dL — ABNORMAL LOW (ref 6.5–8.1)

## 2020-03-29 LAB — CBC
HCT: 29.8 % — ABNORMAL LOW (ref 36.0–46.0)
Hemoglobin: 10 g/dL — ABNORMAL LOW (ref 12.0–15.0)
MCH: 32.3 pg (ref 26.0–34.0)
MCHC: 33.6 g/dL (ref 30.0–36.0)
MCV: 96.1 fL (ref 80.0–100.0)
Platelets: 106 10*3/uL — ABNORMAL LOW (ref 150–400)
RBC: 3.1 MIL/uL — ABNORMAL LOW (ref 3.87–5.11)
RDW: 17.6 % — ABNORMAL HIGH (ref 11.5–15.5)
WBC: 7.4 10*3/uL (ref 4.0–10.5)
nRBC: 0 % (ref 0.0–0.2)

## 2020-03-29 MED ORDER — FENTANYL CITRATE (PF) 100 MCG/2ML IJ SOLN
12.5000 ug | Freq: Once | INTRAMUSCULAR | Status: AC
  Administered 2020-03-29: 04:00:00 12.5 ug via INTRAVENOUS
  Filled 2020-03-29: qty 2

## 2020-03-29 NOTE — Progress Notes (Signed)
Referring Physician(s): Byerly,F  Supervising Physician: Jacqulynn Cadet  Patient Status:  [facility] - In-pt  Chief Complaint: Abdominal pain/cholecystitis   Subjective: Pt resting quietly; no acute changes; no worsening abd pain   Allergies: Heparin and Lisinopril  Medications: Prior to Admission medications   Medication Sig Start Date End Date Taking? Authorizing Provider  allopurinol (ZYLOPRIM) 100 MG tablet Take 200 mg by mouth daily with breakfast.    Yes [provider]  aspirin EC 81 MG tablet Take 81 mg by mouth daily.   Yes [provider]  carvedilol (COREG) 6.25 MG tablet Take 6.25 mg by mouth in the morning and at bedtime.   Yes [provider]  Cholecalciferol 1.25 MG (50000 UT) TABS Take 1 tablet by mouth in the morning and at bedtime.   Yes [provider]  furosemide (LASIX) 20 MG tablet Take 20 mg by mouth 2 (two) times daily.   Yes [provider]  potassium chloride (KLOR-CON) 10 MEQ tablet Take 10 mEq by mouth daily.   Yes [provider]  sennosides-docusate sodium (SENOKOT-S) 8.6-50 MG tablet Take 2 tablets by mouth in the morning and at bedtime.    Yes [provider]  atorvastatin (LIPITOR) 20 MG tablet Take 1 tablet (20 mg total) by mouth daily. Patient not taking: Reported on 03/24/2020 12/30/12   Trinda Pascal, MD  docusate sodium 100 MG CAPS Take 100 mg by mouth 2 (two) times daily. Patient not taking: Reported on 04/19/2020 09/13/12   Gae Gallop, MD  ergocalciferol (VITAMIN D2) 50000 UNITS capsule Take 50,000 Units by mouth every Wednesday.     [provider]  potassium chloride (KLOR-CON M15) 15 MEQ tablet Take 2 tablets (30 mEq total) by mouth once. Patient not taking: Reported on 04/06/2020 10/01/16 10/01/16  Nita Sells, MD     Vital Signs: BP (!) 117/52 (BP Location: Right Wrist)   Pulse 78   Temp (!) 95.6 F (35.3 C)   Resp 14   Ht 5\' 3"  (1.6 m)   Wt 218  lb 0.6 oz (98.9 kg)   SpO2 99%   BMI 38.62 kg/m   Physical Exam awake, HOH, GB drain intact, insertion site ok, not sig tender, OP 90 cc, drain flushed without difficulty  Imaging: US RENAL  Result Date: 03/28/2020 CLINICAL DATA:  Acute on chronic renal failure EXAM: RENAL / URINARY TRACT ULTRASOUND COMPLETE COMPARISON:  None. FINDINGS: Right Kidney: Renal measurements: 8.6 x 3.8 x 3.7 cm. = volume: 64 mL. Increased echogenicity is noted consistent with medical renal disease. Left Kidney: Renal measurements: 8.5 x 4.3 x 4.0 cm. = volume: 77 mL. Increased echogenicity is noted. Bladder: Decompressed by Foley catheter Other: None. IMPRESSION: Increased echogenicity consistent with medical renal disease. No other focal abnormality is noted. Electronically Signed   By: Inez Catalina M.D.   On: 03/28/2020 13:44   IR Perc Cholecystostomy  Result Date: 03-31-2020 INDICATION: 84 year old female with acute calculus cholecystitis. Her condition has deteriorated today concerning for worsening cholecystitis. She is no longer a surgical candidate and presents for placement of an urgent percutaneous cholecystostomy tube. EXAM: CHOLECYSTOSTOMY MEDICATIONS: 400 mg ciprofloxacin; The antibiotic was administered within an appropriate time frame prior to the initiation of the procedure. ANESTHESIA/SEDATION: Moderate (conscious) sedation was employed during this procedure. A total of Versed 1.5 mg and Fentanyl 50 mcg was administered intravenously. Moderate Sedation Time: 17 minutes. The patient's level of consciousness and vital signs were monitored continuously by radiology nursing  throughout the procedure under my direct supervision. FLUOROSCOPY TIME:  Fluoroscopy Time: 0 minutes 36 seconds (9 mGy). COMPLICATIONS: None immediate. PROCEDURE: Informed written consent was obtained from the patient after a thorough discussion of the procedural risks, benefits and alternatives. All questions were addressed. Maximal Sterile  Barrier Technique was utilized including caps, mask, sterile gowns, sterile gloves, sterile drape, hand hygiene and skin antiseptic. A timeout was performed prior to the initiation of the procedure. The right upper quadrant was interrogated with ultrasound. The dilated gallbladder was successfully identified. A suitable skin entry site was selected and marked. Local anesthesia was attained by infiltration with 1% lidocaine. A small dermatotomy was made. Under real-time ultrasound guidance, a 21 gauge Chiba needle was advanced over a rib, along a short transhepatic tract and into the gallbladder lumen. There was return of black bile. A 0.018 wire was advanced into the gallbladder. The needle was removed. A hydrophilic transitional dilator was advanced over the wire and into the gallbladder lumen. The wire and inner dilator were removed. Contrast injection was then performed opacifying the gallbladder lumen. Numerous filling defects are present consistent with cholelithiasis. A 0.035 wire was then coiled in the gallbladder lumen. The transitional dilator was removed. The skin tract was then dilated to 10 [place] and a Cook 10.2 [place] all-purpose drainage catheter advanced over the wire and formed in the gallbladder lumen. An image was obtained and stored for the medical record. Aspiration yields dark black bile. No frank purulence. The catheter was gently flushed and connected to gravity bag drainage. The catheter was secured to the skin with 0 Prolene suture and an adhesive fixation device. The patient tolerated the procedure well. IMPRESSION: Successful placement of a 10 French percutaneous transhepatic percutaneous cholecystostomy tube for acute calculus cholecystitis and a non operative candidate. PLAN: Maintain drain to gravity bag drainage. Flush drainage catheter at least once per shift with 5 mL sterile saline. Follow-up cholangiogram at drain clinic in approximately 4 weeks. Signed, Sterling Big, MD,  [facility] Radiology Electronically Signed   By: Malachy Moan M.D.   On: 03/31/2020 14:02    Labs:  CBC: Recent Labs    03/31/2020 0555 03/27/20 0554 03/28/20 0615 03/29/20 0625  WBC 12.5* 10.4 9.8 7.4  HGB 8.0* 7.2* 9.8* 10.0*  HCT 24.8* 22.3* 30.1* 29.8*  PLT 106* 114* 112* 106*    COAGS: Recent Labs    03/31/2020 1532  INR 1.2  APTT 33    BMP: Recent Labs    04/07/2020 1210 03/27/20 0554 03/28/20 0615 03/29/20 0625  NA 136 138 139 141  K 4.7 4.1 4.9 4.4  CL 106 111 111 109  CO2 19* 18* 14* 14*  GLUCOSE 73 93 72 67*  BUN 39* 41* 46* 47*  CALCIUM 7.2* 7.3* 7.3* 7.7*  CREATININE 4.06* 4.36* 4.81* 5.09*  GFRNONAA 10* 9* 8* 7*  GFRAA 11* 10* 9* 9*    LIVER FUNCTION TESTS: Recent Labs    04/03/2020 0555 03/27/20 0554 03/28/20 0615 03/29/20 0625  BILITOT 1.4* 0.9 1.2 1.0  AST 46* 48* 62* 61*  ALT 27 21 19 21   ALKPHOS 78 81 109 125  PROT 5.1* 4.7* 4.9* 5.1*  ALBUMIN 2.5* 2.1* 2.1* 2.0*    Assessment and Plan: Patient with history of acute calculus cholecystitis (poor surgical candidate), hypotension, chronic kidney disease, heart failure, coronary artery disease, diabetes; status post percutaneous cholecystostomy on 4/6; afebrile, WBC normal,creat 5.09(4.51)- nephrology following; hgb 10; bile cx neg  to date; Gallbladder drain will need to remain in place at least 4 to 6 weeks unless cholecystectomy done in interim.  Continue with drain irrigation, output monitoring, lab checks; she will be scheduled for follow-up drain injection in 4 to 6 weeks. Other plans per surgery, TRH, nephrology. As OP GB drain will need to be flushed once daily with 5 cc sterile NS and output recorded.   Electronically Signed: D. Jeananne Rama, PA-C 03/29/2020, 2:40 PM   I spent a total of 15 minutes at the the patient's bedside AND on the patient's hospital floor or unit, greater than 50% of which was counseling/coordinating  care for gallbladder drain    Patient ID: [name], female   DOB: 07-08-1936, 84 y.o.   MRN: [id number]

## 2020-03-29 NOTE — TOC Initial Note (Signed)
Transition of Care [facility]) - Initial/Assessment Note    Patient Details  Name: [name] MRN: [id number] Date of Birth: 1936-08-01  Transition of Care [facility]) CM/SW Contact:    Ida Rogue, LCSW Phone Number: 03/29/2020, 3:02 PM  Clinical Narrative:    Patient seen in follow up to PT recommendation of SNF.  Unable to carry on meaningful conversation with patient.  Called daughter, who states she has been caregiver at home for patient since August of 2019, took her out of LTC at [facility] at that time.  Has all needed DME at home and is serviced by [facility].  Daughter feels that rehab is appropriate, says that patient recently spent a week of resp[facility], and is hoping she can get in there.  [facility] is second choice and [facility] is not preferred.  Called Ladona Ridgel, social worker at [facility], to confirm plan.  Bed search sent out to those three facilities that are in network with [facility]. TOC will continue to follow during the course of hospitalization.              Expected Discharge Plan: Skilled Nursing Facility Barriers to Discharge: SNF Pending bed offer   Patient Goals and CMS Choice        Expected Discharge Plan and Services Expected Discharge Plan: Skilled Nursing Facility   Discharge Planning Services: CM Consult   Living arrangements for the past 2 months: Single Family Home                                      Prior Living Arrangements/Services Living arrangements for the past 2 months: Single Family Home Lives with:: Adult Children Patient language and need for interpreter reviewed:: Yes        Need for Family Participation in Patient Care: Yes (Comment) Care giver support system in place?: Yes (comment) Current home services: DME Criminal Activity/Legal Involvement Pertinent to Current Situation/Hospitalization: No - Comment as needed  Activities of Daily Living Home Assistive Devices/Equipment: Environmental consultant (specify type),  Wheelchair ADL Screening (condition at time of admission) Patient's cognitive ability adequate to safely complete daily activities?: Yes Is the patient deaf or have difficulty hearing?: No Does the patient have difficulty seeing, even when wearing glasses/contacts?: Yes(blind) Does the patient have difficulty concentrating, remembering, or making decisions?: No Patient able to express need for assistance with ADLs?: Yes Does the patient have difficulty dressing or bathing?: Yes Independently performs ADLs?: No Communication: Independent Dressing (OT): Dependent Is this a change from baseline?: Pre-admission baseline Grooming: Dependent Is this a change from baseline?: Pre-admission baseline Feeding: Dependent Is this a change from baseline?: Pre-admission baseline Bathing: Dependent Is this a change from baseline?: Pre-admission baseline Toileting: Dependent Is this a change from baseline?: Pre-admission baseline In/Out Bed: Dependent Is this a change from baseline?: Pre-admission baseline Walks in Home: Dependent Is this a change from baseline?: Pre-admission baseline Does the patient have difficulty walking or climbing stairs?: No Weakness of Legs: Both Weakness of Arms/Hands: None  Permission Sought/Granted Permission sought to share information with : Family Supports Permission granted to share information with : Yes, Verbal Permission Granted  Share Information with NAME: Moumouni,[name]     Permission granted to share info w Relationship: daughter  Permission granted to share info w Contact Information: [phone]  Emotional Assessment Appearance:: Appears stated age Attitude/Demeanor/Rapport: Unable to Assess Affect (typically observed):  Unable to Assess   Alcohol / Substance Use: Not Applicable Psych Involvement: No (comment)  Admission diagnosis:  Acute cholecystitis [K81.0] Abdominal pain [R10.9] Severe sepsis ([place]) [A41.9, R65.20] Patient Active Problem List    Diagnosis Date Noted  . Chronic diastolic CHF (congestive heart failure) ([place])   . Acute kidney injury superimposed on chronic kidney disease ([place]) 04/10/2020  . Acute calculous cholecystitis   . Coronary artery disease involving native coronary artery of native heart without angina pectoris   . Acute cholecystitis 04/06/2020  . Drug-induced acute pancreatitis without infection or necrosis   . Muscle weakness (generalized)   . Hypokalemia   . C. difficile diarrhea 09/18/2016  . Leukocytosis 09/18/2016  . Acute renal failure (ARF) ([place]) 09/18/2016  . Anemia in stage 3 chronic kidney disease 11/06/2014  . Acute encephalopathy 11/05/2014  . Diabetes ([place]) 10/04/2014  . Type II or unspecified type diabetes mellitus with renal manifestations, uncontrolled(250.42) 10/13/2013  . Gout, unspecified 09/14/2013  . Urinary tract infection, site not specified 08/31/2013  . Edema 07/06/2013  . Encounter for long-term (current) use of other medications 07/06/2013  . Anemia in chronic kidney disease 07/05/2013  . 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 Type II or unspecified type diabetes mellitus with renal manifestations, not stated as uncontrolled(250.40) 06/02/2013  . Unspecified constipation 04/20/2013  . Psychosis ([place]) 04/20/2013  . Right ankle sprain 12/30/2012  . Edema of foot 12/28/2012  . Acute on chronic renal failure ([place]) 12/27/2012  . Nonischemic cardiomyopathy ([place]) 12/27/2012  . Dysuria 12/27/2012  . UTI (lower urinary tract infection) 09/10/2012  . Chronic diastolic congestive heart failure ([place]) 04/01/2012  . OSA (obstructive sleep apnea) 03/04/2012  . Preventative health care 11/19/2011  . Insomnia 08/04/2011  . Boil of buttock 08/04/2011  . Visual hallucinations 07/15/2011  . Proliferative diabetic retinopathy ([place]) 01/28/2009  . GLAUCOMA 12/06/2008  . Coronary atherosclerosis 10/19/2008  . ALLERGIC RHINITIS 01/03/2008  . Stage III chronic kidney  disease 01/10/2007  . 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 HYPERLIPIDEMIA 12/27/2006  . Essential hypertension 12/27/2006  . Chronic diastolic heart failure ([place]) 12/27/2006   PCP:  Janifer Adie, MD Pharmacy:   [facility] ([facility] [place] - [address] [place] [address] Phone: [phone] Fax: [place] [address], [place] [address] Phone: [phone] Fax: [place], [place] - [place] [address] Phone: [phone] Fax: [phone]  [id number] [place], [place] [facility] [address] [address] [place] [address] Phone: [phone] Fax: [phone]     Social Determinants of Health (SDOH) Interventions    Readmission Risk Interventions No flowsheet data found.

## 2020-03-29 NOTE — Evaluation (Signed)
Occupational Therapy Evaluation Patient Details Name: [name] MRN: [id number] DOB: [birth year] Today's Date: 03/29/2020    History of Present Illness [name] is a 84 y.o. female with medical history significant of clinically blind, diabetes which is diet controlled, hypertension, dCHF, CKD stage IIIb and remote history of pancreatitis [facility], presented to the emergency department complaining of RUQ abdominal pain and vomiting for 1 day.  Patient reported after having her meal she developed severe abdominal pain, which was sharp in nature.  And started vomiting.  Later on she developed chills and had diarrhea.  Patient denies chest pain, shortness of breath, dizziness, weakness and cough. In ED: Vitals stable, CT of the abdomen revealed possible acute cholecystitis and cholelithiasis. White count 20 K with left shift, hemoglobin 10.6, creatinine of 1.86, this and total bili minimally elevated.  Renal ultrasound pending.  ER physician called as a code sepsis with sofa score of 2. Surgical team was consulted which recommended to admit to medicine and treat with antibiotic for now.   Clinical Impression   Pt is dependent in all ADL and mobility. She followed one command during session and appeared anxious when positioned in sitting at EOB. She demonstrated a strong posterior lean. Pt exclaiming "Hey" throughout session and "Ok" at times. No acute OT needs. Recommending SNF.    Follow Up Recommendations  SNF;Supervision/Assistance - 24 hour    Equipment Recommendations       Recommendations for Other Services       Precautions / Restrictions Precautions Precautions: Fall Precaution Comments: blind Restrictions Weight Bearing Restrictions: No      Mobility Bed Mobility Overal bed mobility: Needs Assistance Bed Mobility: Rolling;Supine to Sit;Sit to Supine Rolling: +2 for physical assistance;Total assist   Supine to sit: +2 for physical  assistance;Total assist Sit to supine: +2 for physical assistance;Total assist   General bed mobility comments: assist for all aspects, strong posterior lean with supine to sit and in sitting  Transfers                 General transfer comment: pt will require lift equipment    Balance Overall balance assessment: Needs assistance Sitting-balance support: Feet unsupported Sitting balance-Leahy Scale: Zero                                     ADL either performed or assessed with clinical judgement   ADL                                         General ADL Comments: requires total care     Vision Baseline Vision/History: Legally blind       Perception     Praxis      Pertinent Vitals/Pain Pain Assessment: Faces Faces Pain Scale: No hurt     Hand Dominance     Extremity/Trunk Assessment Upper Extremity Assessment Upper Extremity Assessment: Generalized weakness;Difficult to assess due to impaired cognition(edematous)   Lower Extremity Assessment Lower Extremity Assessment: Defer to PT evaluation   Cervical / Trunk Assessment Cervical / Trunk Assessment: Other exceptions Cervical / Trunk Exceptions: obesity, weakness   Communication Communication Communication: HOH;Expressive difficulties(pt only stating, "Hey" and " OK")   Cognition Arousal/Alertness: Lethargic Behavior During Therapy: Anxious(with movement) Overall Cognitive Status: No family/caregiver present to  determine baseline cognitive functioning                                     General Comments       Exercises     Shoulder Instructions      Home Living Family/patient expects to be discharged to:: Unsure Living Arrangements: Children                               Additional Comments: pt discharged from SNF on 3/29, readmitted on 4/3      Prior Functioning/Environment Level of Independence: Needs assistance  Gait /  Transfers Assistance Needed: bedbound per chart ADL's / Homemaking Assistance Needed: likely dependent            OT Problem List:        OT Treatment/Interventions:      OT Goals(Current goals can be found in the care plan section)    OT Frequency:     Barriers to D/C:            Co-evaluation PT/OT/SLP Co-Evaluation/Treatment: Yes Reason for Co-Treatment: Complexity of the patient's impairments (multi-system involvement);For patient/therapist safety PT goals addressed during session: Mobility/safety with mobility;Balance OT goals addressed during session: ADL's and self-care      AM-PAC OT "6 Clicks" Daily Activity     Outcome Measure Help from another person eating meals?: Total Help from another person taking care of personal grooming?: Total Help from another person toileting, which includes using toliet, bedpan, or urinal?: Total Help from another person bathing (including washing, rinsing, drying)?: Total Help from another person to put on and taking off regular upper body clothing?: Total Help from another person to put on and taking off regular lower body clothing?: Total 6 Click Score: 6   End of Session    Activity Tolerance: Patient tolerated treatment well Patient left: in bed;with call bell/phone within reach;with bed alarm set  OT Visit Diagnosis: Other symptoms and signs involving cognitive function;Muscle weakness (generalized) (M62.81)                Time: 3474-2595 OT Time Calculation (min): 13 min Charges:  OT General Charges $OT Visit: 1 Visit OT Evaluation $OT Eval Moderate Complexity: 1 Mod  Nestor Lewandowsky, OTR/L Acute Rehabilitation Services Pager: [phone] Office: [phone]  Malka So 03/29/2020, 12:22 PM

## 2020-03-29 NOTE — [place] FL2 (Signed)
[place] MEDICAID FL2 LEVEL OF CARE SCREENING TOOL     IDENTIFICATION  Patient Name: [name] Birthdate: October 17, 1936 Sex: female Admission Date (Current Location): 04/16/2020  [facility] and [place] Number:  Producer, television/film/video and Address:  [facility],  [address] [place], [place] [address]      Provider Number: [id number]  Attending Physician Name and Address:  Azucena Fallen, MD  Relative Name and Phone Number:       Current Level of Care: Hospital Recommended Level of Care: Skilled Nursing Facility Prior Approval Number:    Date Approved/Denied:   PASRR Number:  [id number]  Discharge Plan: SNF    Current Diagnoses: Patient Active Problem List   Diagnosis Date Noted  . Chronic diastolic CHF (congestive heart failure) (HCC)   . Acute kidney injury superimposed on chronic kidney disease (HCC) 04/03/20  . Acute calculous cholecystitis   . Coronary artery disease involving native coronary artery of native heart without angina pectoris   . Acute cholecystitis 04/15/2020  . Drug-induced acute pancreatitis without infection or necrosis   . Muscle weakness (generalized)   . Hypokalemia   . C. difficile diarrhea 09/18/2016  . Leukocytosis 09/18/2016  . Acute renal failure (ARF) (HCC) 09/18/2016  . Anemia in stage 3 chronic kidney disease 11/06/2014  . Acute encephalopathy 11/05/2014  . Diabetes (HCC) 10/04/2014  . Type II or unspecified type diabetes mellitus with renal manifestations, uncontrolled(250.42) 10/13/2013  . Gout, unspecified 09/14/2013  . Urinary tract infection, site not specified 08/31/2013  . Edema 07/06/2013  . Encounter for long-term (current) use of other medications 07/06/2013  . Anemia in chronic kidney disease 07/05/2013  . 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 Type II or unspecified type diabetes mellitus with renal manifestations, not stated as uncontrolled(250.40) 06/02/2013  . Unspecified  constipation 04/20/2013  . Psychosis (HCC) 04/20/2013  . Right ankle sprain 12/30/2012  . Edema of foot 12/28/2012  . Acute on chronic renal failure (HCC) 12/27/2012  . Nonischemic cardiomyopathy (HCC) 12/27/2012  . Dysuria 12/27/2012  . UTI (lower urinary tract infection) 09/10/2012  . Chronic diastolic congestive heart failure (HCC) 04/01/2012  . OSA (obstructive sleep apnea) 03/04/2012  . Preventative health care 11/19/2011  . Insomnia 08/04/2011  . Boil of buttock 08/04/2011  . Visual hallucinations 07/15/2011  . Proliferative diabetic retinopathy (HCC) 01/28/2009  . GLAUCOMA 12/06/2008  . Coronary atherosclerosis 10/19/2008  . ALLERGIC RHINITIS 01/03/2008  . Stage III chronic kidney disease 01/10/2007  . 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 HYPERLIPIDEMIA 12/27/2006  . Essential hypertension 12/27/2006  . Chronic diastolic heart failure (HCC) 12/27/2006    Orientation RESPIRATION BLADDER Height & Weight     Self  Normal External catheter Weight: 98.9 kg Height:  5\' 3"  (160 cm)  BEHAVIORAL SYMPTOMS/MOOD NEUROLOGICAL BOWEL NUTRITION STATUS  (none) (none) Incontinent Diet(see d/c summary)  AMBULATORY STATUS COMMUNICATION OF NEEDS Skin   Extensive Assist Verbally Other (Comment)(Biliary Tube)                       Personal Care Assistance Level of Assistance  Bathing, Feeding, Dressing Bathing Assistance: Maximum assistance Feeding assistance: Limited assistance Dressing Assistance: Maximum assistance     Functional Limitations Info  Sight, Hearing, Speech Sight Info: Impaired(blind) Hearing Info: Adequate Speech Info: Adequate    SPECIAL CARE FACTORS FREQUENCY  PT (By licensed PT), OT (By licensed OT)     PT Frequency: 5X/W OT Frequency:  5X/W            Contractures Contractures Info: Not present    Additional Factors Info  Code Status, Allergies Code Status Info: DNR Allergies Info: Heparin,  Lisinopril           Current Medications (03/29/2020):  This is the current hospital active medication list Current Facility-Administered Medications  Medication Dose Route Frequency Provider Last Rate Last Admin  . ceFEPIme (MAXIPIME) 1 g in sodium chloride 0.9 % 100 mL IVPB  1 g Intravenous Q24H Green, Terri L, RPH 200 mL/hr at 03/28/20 1625 1 g at 03/28/20 1625  . Chlorhexidine Gluconate Cloth 2 % PADS 6 each  6 each Topical Daily Little Ishikawa, MD   6 each at 03/28/20 1000  . enoxaparin (LOVENOX) injection 30 mg  30 mg Subcutaneous Q24H Patrecia Pour, Christean Grief, MD   30 mg at 03/28/20 1626  . HYDROcodone-acetaminophen (NORCO/VICODIN) 5-325 MG per tablet 1-2 tablet  1-2 tablet Oral Q4H PRN Patrecia Pour, Christean Grief, MD   1 tablet at 03/27/20 0453  . metroNIDAZOLE (FLAGYL) IVPB 500 mg  500 mg Intravenous Q8H Patrecia Pour, Christean Grief, MD 100 mL/hr at 03/29/20 1114 500 mg at 03/29/20 1114  . ondansetron (ZOFRAN) tablet 4 mg  4 mg Oral Q6H PRN Patrecia Pour, Christean Grief, MD       Or  . ondansetron [facility]) injection 4 mg  4 mg Intravenous Q6H PRN Patrecia Pour, Christean Grief, MD   4 mg at 03/27/20 1224  . sodium chloride flush (NS) 0.9 % injection 5 mL  5 mL Intracatheter Q8H Jacqulynn Cadet, MD   5 mL at 03/29/20 1404     Discharge Medications: Please see discharge summary for a list of discharge medications.  Relevant Imaging Results:  Relevant Lab Results:   Additional Information [phone]  Trish Mage, [place]

## 2020-03-29 NOTE — Evaluation (Signed)
Physical Therapy Evaluation Patient Details Name: [name] MRN: [id number] DOB: 1936/05/15 Today's Date: 03/29/2020   History of Present Illness  [name] is a 84 y.o. female with medical history significant of clinically blind, diabetes which is diet controlled, hypertension, dCHF, CKD stage IIIb and remote history of pancreatitis [facility], presented to the emergency department complaining of RUQ abdominal pain and vomiting for 1 day.  Patient reported after having her meal she developed severe abdominal pain, which was sharp in nature.  And started vomiting.  Later on she developed chills and had diarrhea.  Patient denies chest pain, shortness of breath, dizziness, weakness and cough. In ED: Vitals stable, CT of the abdomen revealed possible acute cholecystitis and cholelithiasis. White count 20 K with left shift, hemoglobin 10.6, creatinine of 1.86, this and total bili minimally elevated.  Renal ultrasound pending.  ER physician called as a code sepsis with sofa score of 2. Surgical team was consulted which recommended to admit to medicine and treat with antibiotic for now.  Clinical Impression  Pt dependent for all mobility. Pt appears anxious with mobility rolling and supine<>sit, demonstrates strong posterior lean in supported sitting EOB. Pt followed 1 command during eval and saying "hey" and "ok" throughout evaluation to all verbal and tactile cues. No acute PT needs at this time, recommending SNF.     Follow Up Recommendations SNF;Supervision/Assistance - 24 hour    Equipment Recommendations  None recommended by PT    Recommendations for Other Services       Precautions / Restrictions Precautions Precautions: Fall Precaution Comments: blind Restrictions Weight Bearing Restrictions: No      Mobility  Bed Mobility Overal bed mobility: Needs Assistance Bed Mobility: Rolling;Supine to Sit;Sit to Supine Rolling: +2 for physical  assistance;Total assist   Supine to sit: +2 for physical assistance;Total assist Sit to supine: +2 for physical assistance;Total assist   General bed mobility comments: assist for all aspects, strong posterior lean with supine<>sit and in sitting  Transfers      General transfer comment: pt will require lift equipment  Ambulation/Gait             General Gait Details: not attempted  Stairs            Wheelchair Mobility    Modified Rankin (Stroke Patients Only)       Balance Overall balance assessment: Needs assistance Sitting-balance support: Feet unsupported Sitting balance-Leahy Scale: Zero Sitting balance - Comments: strong extension/posterior lean in supported sitting, total assist to maintain Postural control: Posterior lean              Pertinent Vitals/Pain Pain Assessment: Faces Faces Pain Scale: No hurt    Home Living Family/patient expects to be discharged to:: Unsure Living Arrangements: Children               Additional Comments: pt discharged from SNF on 3/29, readmitted on 4/3    Prior Function Level of Independence: Needs assistance   Gait / Transfers Assistance Needed: bedbound per chart  ADL's / Homemaking Assistance Needed: likely dependent        Hand Dominance        Extremity/Trunk Assessment   Upper Extremity Assessment Upper Extremity Assessment: Defer to OT evaluation    Lower Extremity Assessment Lower Extremity Assessment: Generalized weakness;Difficult to assess due to impaired cognition    Cervical / Trunk Assessment Cervical / Trunk Assessment: Other exceptions Cervical / Trunk Exceptions: obesity, weakness  Communication  Communication: HOH;Expressive difficulties(pt only stating, "Hey" and " OK")  Cognition Arousal/Alertness: Lethargic Behavior During Therapy: Anxious(with movement) Overall Cognitive Status: No family/caregiver present to determine baseline cognitive functioning                                         General Comments      Exercises     Assessment/Plan    PT Assessment All further PT needs can be met in the next venue of care  PT Problem List Decreased strength;Decreased range of motion;Decreased activity tolerance;Decreased balance;Decreased mobility;Decreased cognition;Obesity       PT Treatment Interventions      PT Goals (Current goals can be found in the Care Plan section)  Acute Rehab PT Goals Patient Stated Goal: none stated PT Goal Formulation: Patient unable to participate in goal setting    Frequency     Barriers to discharge        Co-evaluation PT/OT/SLP Co-Evaluation/Treatment: Yes Reason for Co-Treatment: Complexity of the patient's impairments (multi-system involvement);For patient/therapist safety PT goals addressed during session: Mobility/safety with mobility;Balance OT goals addressed during session: ADL's and self-care       AM-PAC PT "6 Clicks" Mobility  Outcome Measure Help needed turning from your back to your side while in a flat bed without using bedrails?: Total Help needed moving from lying on your back to sitting on the side of a flat bed without using bedrails?: Total Help needed moving to and from a bed to a chair (including a wheelchair)?: Total Help needed standing up from a chair using your arms (e.g., wheelchair or bedside chair)?: Total Help needed to walk in hospital room?: Total Help needed climbing 3-5 steps with a railing? : Total 6 Click Score: 6    End of Session   Activity Tolerance: Other (comment)(pt limited secondary to cognition) Patient left: in bed;with call bell/phone within reach;with bed alarm set;with nursing/sitter in room Nurse Communication: Mobility status PT Visit Diagnosis: Other abnormalities of gait and mobility (R26.89);Muscle weakness (generalized) (M62.81)    Time: 3358-2518 PT Time Calculation (min) (ACUTE ONLY): 16 min   Charges:   PT Evaluation $PT  Eval Moderate Complexity: 1 Mod           Tori Paul Trettin PT, DPT 03/29/20, 1:22 PM [phone]

## 2020-03-29 NOTE — Progress Notes (Signed)
PROGRESS NOTE    [name]  LNL:[id number] DOB: [birth year] DOA: 04/09/2020 PCP: Janifer Adie, MD   Brief Narrative:  [name] is a 84 y.o. female with medical history significant of clinically blind, diabetes which is diet controlled, hypertension, dCHF, CKD stage IIIb and remote history of pancreatitis who [facility], presented to the emergency department complaining of RUQ abdominal pain and vomiting for 1 day.  Patient reported after having her meal she developed severe abdominal pain, which was sharp in nature.  And started vomiting.  Later on she developed chills and had diarrhea.  Patient denies chest pain, shortness of breath, dizziness, weakness and cough. In ED: Vitals stable, CT of the abdomen revealed possible acute cholecystitis and cholelithiasis. White count 20 K with left shift, hemoglobin 10.6, creatinine of 1.86, this and total bili minimally elevated.  Renal ultrasound pending.  ER physician called as a code sepsis with sofa score of 2. Surgical team was consulted which recommended to admit to medicine and treat with antibiotic for now.  Subjective: No acute issues or events overnight, patient's creatinine continues to climb.  She continues to be somewhat difficult to interview given her hearing impairment but continue to decline chest pain, shortness of breath, nausea, vomiting; she no longer endorses abdominal pain even with palpation.  Assessment & Plan:   Active Problems:   Acute cholecystitis   Coronary artery disease involving native coronary artery of native heart without angina pectoris   Acute calculous cholecystitis   Acute kidney injury superimposed on chronic kidney disease (HCC)   Chronic diastolic CHF (congestive heart failure) ([place])   Sepsis due to acute cholecystitis, present on admission, resolving. - General surgery previously following, status post percutaneous chole drain 04/14/2020 - will need to remain in place  for 4 to 6 weeks unless cholecystectomy can be done in the interim per IR. Will need follow up in 6 weeks to see Dr. Barry Dienes - Continue cefepime and Flagyl for coverage of abdomen - likely 10 day course - BP better, afebrile, leukocytosis improving - all good prognosticator that sepsis is resolving - Currently very poor p.o. intake since prior to admission - continue IV fluids, advance diet per surgical recommendations  Acute on CKD stage IIIb  with worsening urine output, anuric at this point, ongoing - Poor p.o. intake in the setting of above likely exacerbating CKD 3B  -Creatinine continues to worsen despite IV fluids, nephrology following, appreciate insight and recommendations -Currently discontinue fluids given respiratory rate/mild wheeze - Unclear if this is simply prerenal due to poor volume status or worsening intra-abdominal infection/sepsis - Baseline appears to be around 1.5 per chart review - Foley catheter to follow UOP more accurately -essentially anuric over the past 12 hours Lab Results  Component Value Date   CREATININE 5.09 (H) 03/29/2020   CREATININE 4.81 (H) 03/28/2020   CREATININE 4.36 (H) 03/27/2020   HTN, essential - currently hypotensive but improving - Discontinue carvedilol, Lasix previously on hold given AKI - Follow clinically, blood pressure minimally improving with IV fluids and advancement of diet - Patient's chronic blood pressure appears to be around 100/60 per distant chart review - BP improving - off fluids given respiratory symptoms  Anemia, likely acute hemodilutional anemia on chronic anemia of chronic disease -Transfused 1 unit PRBC 03/27/2020 given worsening anemia, renal function, volume status - Hemoglobin improved more drastically than expected consistent with hypovolemia CBC Latest Ref Rng & Units 03/29/2020 03/28/2020 03/27/2020  WBC 4.0 -  10.5 K/uL 7.4 9.8 10.4  Hemoglobin 12.0 - 15.0 g/dL 10.0(L) 9.8(L) 7.2(L)  Hematocrit 36.0 - 46.0 % 29.8(L)  30.1(L) 22.3(L)  Platelets 150 - 400 K/uL 106(L) 112(L) 114(L)   Chronic diastolic CHF  - Appears somewhat volume overloaded today, hold Lasix given AKI as above  - Cardiology following at the request of surgery, echocardiogram as below - Fluids cautiously as above - monitor for respiratory decline  DVT prophylaxis: Lovenox   Code Status: DNR  Family Communication: Daughter [name] updated at bedside Disposition Plan: Unclear disposition status, discussion with [name] daily indicates she would like patient to discharge to SNF if reasonable however given patient's worsening status we discussed that given her advanced age, worsening creatinine, worsening urine output, low blood pressure, infection and advanced age with multiple chronic comorbid conditions if she does not continue to improve over the next few days we would likely need to discuss palliative care in hopes to keep the patient comfortable which is a primary goal for the family..   Admission status: Inpatient, Medsurg - continues to require further monitoring in the setting of sepsis, acute cholecystitis, hypotension, requiring IV fluids, IV antibiotics and IV pain medication.  Consultants:   General Surgery  Cardiology  IR  Nephrology  Procedures:   IR placed perc drain of gallbladder 03/30/2020  Antimicrobials:  Cefepime, Flagyl 04/17/2020--> likely 10 day course through 2020-04-12  Objective: Vitals:   03/28/20 1445 03/28/20 1445 03/28/20 2053 03/29/20 0526  BP:  (!) 96/58 (!) 100/51 (!) 112/54  Pulse:  75 91 76  Resp:  14 16 18   Temp:  97.6 F (36.4 C) 97.9 F (36.6 C) (!) 97.5 F (36.4 C)  TempSrc: Axillary Oral Oral Oral  SpO2:  100% 100% 97%  Weight:    98.9 kg  Height:        Intake/Output Summary (Last 24 hours) at 03/29/2020 0732 Last data filed at 03/29/2020 0527 Gross per 24 hour  Intake 891.03 ml  Output 120 ml  Net 771.03 ml   Filed Weights   04/03/2020 1153 03/28/20 0450 03/29/20 0526  Weight: 85.7 kg  95.9 kg 98.9 kg    Examination:  General:  Pleasantly resting in bed, No acute distress. Somnolent but arousable to voice, difficulty answering complex questions but follows simple commands, alert to person only at this point HEENT:  Normocephalic atraumatic.  Sclerae nonicteric, noninjected.  Extraocular movements intact bilaterally. Neck:  Without mass or deformity.  Trachea is midline. Lungs:  Clear to auscultate bilaterally without rhonchi, wheeze, or rales. Heart:  Regular rate and rhythm.  Without murmurs, rubs, or gallops. Abdomen:  Soft, moderate tenderness generally but PMI right upper quadrant; percutaneous drain bandage clean dry intact Extremities: Bilateral upper extremities 1+ pitting edema distally, lower extremities scant to 1+ pitting edema. Vascular:  Dorsalis pedis and posterior tibial pulses palpable bilaterally. Skin:  Warm and dry, no erythema, no ulcerations.   Data Reviewed: I have personally reviewed following labs and imaging studies  CBC: Recent Labs  Lab 04/10/2020 1147 03/24/20 0550 03/25/20 0115 04/16/2020 0555 03/27/20 0554 03/28/20 0615 03/29/20 0625  WBC 20.7*   < > 20.0* 12.5* 10.4 9.8 7.4  NEUTROABS 17.7*  --   --   --   --   --   --   HGB 10.6*   < > 7.9* 8.0* 7.2* 9.8* 10.0*  HCT 33.8*   < > 25.2* 24.8* 22.3* 30.1* 29.8*  MCV 105.6*   < > 106.3* 105.1* 105.7* 95.9 96.1  PLT 154   < > 114* 106* 114* 112* 106*   < > = values in this interval not displayed.   Basic Metabolic Panel: Recent Labs  Lab 04/09/2020 0555 04/17/2020 1210 03/27/20 0554 03/28/20 0615 03/29/20 0625  NA 137 136 138 139 141  K 4.0 4.7 4.1 4.9 4.4  CL 109 106 111 111 109  CO2 20* 19* 18* 14* 14*  GLUCOSE 78 73 93 72 67*  BUN 37* 39* 41* 46* 47*  CREATININE 3.94* 4.06* 4.36* 4.81* 5.09*  CALCIUM 7.3* 7.2* 7.3* 7.3* 7.7*   GFR: Estimated Creatinine Clearance: 9.7 mL/min (A) (by C-G formula based on SCr of 5.09 mg/dL (H)). Liver Function Tests: Recent Labs  Lab  03/25/20 0115 04/14/2020 0555 03/27/20 0554 03/28/20 0615 03/29/20 0625  AST 46* 46* 48* 62* 61*  ALT 38 27 21 19 21   ALKPHOS 63 78 81 109 125  BILITOT 1.5* 1.4* 0.9 1.2 1.0  PROT 5.1* 5.1* 4.7* 4.9* 5.1*  ALBUMIN 2.4* 2.5* 2.1* 2.1* 2.0*   Recent Labs  Lab Apr 09, 2020 1147  LIPASE 23   No results for input(s): AMMONIA in the last 168 hours. Coagulation Profile: Recent Labs  Lab 04-09-2020 1532  INR 1.2   Cardiac Enzymes: No results for input(s): CKTOTAL, CKMB, CKMBINDEX, TROPONINI in the last 168 hours. BNP (last 3 results) No results for input(s): PROBNP in the last 8760 hours. HbA1C: No results for input(s): HGBA1C in the last 72 hours. CBG: Recent Labs  Lab 03/24/20 2139 03/25/20 0600  GLUCAP 142* 94   Lipid Profile: No results for input(s): CHOL, HDL, LDLCALC, TRIG, CHOLHDL, LDLDIRECT in the last 72 hours. Thyroid Function Tests: No results for input(s): TSH, T4TOTAL, FREET4, T3FREE, THYROIDAB in the last 72 hours. Anemia Panel: No results for input(s): VITAMINB12, FOLATE, FERRITIN, TIBC, IRON, RETICCTPCT in the last 72 hours. Sepsis Labs: Recent Labs  Lab 04/09/2020 1812 2020/04/09 2303 03/24/20 2141 03/25/20 0115  LATICACIDVEN 2.4* 1.5 2.9* 1.7    Recent Results (from the past 240 hour(s))  Blood Culture (routine x 2)     Status: None   Collection Time: 04-09-20  3:32 PM   Specimen: BLOOD  Result Value Ref Range Status   Specimen Description   Final    BLOOD LEFT ANTECUBITAL Performed at [facility], [address], [place], [place]    Special Requests   Final    BOTTLES DRAWN AEROBIC AND ANAEROBIC Blood Culture results may not be optimal due to an inadequate volume of blood received in culture bottles Performed at [facility], [address], [place], [place]    Culture   Final    NO GROWTH 5 DAYS Performed at [facility], [address], [place], [place]    Report Status  03/28/2020 FINAL  Final  Respiratory Panel by RT PCR (Flu A&B, Covid) - Nasopharyngeal Swab     Status: None   Collection Time: April 09, 2020  3:35 PM   Specimen: Nasopharyngeal Swab  Result Value Ref Range Status   SARS Coronavirus 2 by RT PCR NEGATIVE NEGATIVE Final    Comment: (NOTE) SARS-CoV-2 target nucleic acids are NOT DETECTED. The SARS-CoV-2 RNA is generally detectable in upper respiratoy specimens during the acute phase of infection. The lowest concentration of SARS-CoV-2 viral copies this assay can detect is 131 copies/mL. A negative result does not preclude SARS-Cov-2 infection and should not be used as the sole basis for treatment or other patient  management decisions. A negative result may occur with  improper specimen collection/handling, submission of specimen other than nasopharyngeal swab, presence of viral mutation(s) within the areas targeted by this assay, and inadequate number of viral copies (<131 copies/mL). A negative result must be combined with clinical observations, patient history, and epidemiological information. The expected result is Negative. Fact Sheet for Patients:  https://www.moore.com/ Fact Sheet for Healthcare Providers:  https://www.young.biz/ This test is not yet ap proved or cleared by the [place] FDA and  has been authorized for detection and/or diagnosis of SARS-CoV-2 by FDA under an Emergency Use Authorization (EUA). This EUA will remain  in effect (meaning this test can be used) for the duration of the COVID-19 declaration under Section 564(b)(1) of the Act, 21 U.S.C. section 360bbb-3(b)(1), unless the authorization is terminated or revoked sooner.    Influenza A by PCR NEGATIVE NEGATIVE Final   Influenza B by PCR NEGATIVE NEGATIVE Final    Comment: (NOTE) The Xpert Xpress SARS-CoV-2/FLU/RSV assay is intended as an aid in  the diagnosis of influenza from Nasopharyngeal swab specimens and  should  not be used as a sole basis for treatment. Nasal washings and  aspirates are unacceptable for Xpert Xpress SARS-CoV-2/FLU/RSV  testing. Fact Sheet for Patients: https://www.moore.com/ Fact Sheet for Healthcare Providers: https://www.young.biz/ This test is not yet approved or cleared by the [place] FDA and  has been authorized for detection and/or diagnosis of SARS-CoV-2 by  FDA under an Emergency Use Authorization (EUA). This EUA will remain  in effect (meaning this test can be used) for the duration of the  Covid-19 declaration under Section 564(b)(1) of the Act, 21  U.S.C. section 360bbb-3(b)(1), unless the authorization is  terminated or revoked. Performed at [facility], [address], [place], [place] [address]   Blood Culture (routine x 2)     Status: None   Collection Time: 04/16/2020  3:37 PM   Specimen: BLOOD RIGHT HAND  Result Value Ref Range Status   Specimen Description   Final    BLOOD RIGHT HAND Performed at [facility], [address], [place], [place] [address]    Special Requests   Final    BOTTLES DRAWN AEROBIC AND ANAEROBIC Blood Culture adequate volume Performed at [facility], [address], [place], [place] [address]    Culture   Final    NO GROWTH 5 DAYS Performed at [facility], [address] [place], [place] [address]    Report Status 03/28/2020 FINAL  Final  Urine culture     Status: None   Collection Time: 03/24/20 12:34 AM   Specimen: In/Out Cath Urine  Result Value Ref Range Status   Specimen Description   Final    IN/OUT CATH URINE Performed at [facility], [address], [place], [place] [address]    Special Requests   Final    NONE Performed at [facility], [address], [place], [place] [address]    Culture   Final    NO GROWTH Performed at [facility], [address],  [place], [place] [address]    Report Status 03/25/2020 FINAL  Final  Surgical pcr screen     Status: None   Collection Time: 03/25/20  7:44 PM   Specimen: Nasal Mucosa; Nasal Swab  Result Value Ref Range Status   MRSA, PCR NEGATIVE NEGATIVE Final   Staphylococcus aureus NEGATIVE NEGATIVE Final    Comment: (NOTE)  The Xpert SA Assay (FDA approved for NASAL specimens in patients 38 years of age and older), is one component of a comprehensive surveillance program. It is not intended to diagnose infection nor to guide or monitor treatment. Performed at [facility], [address] [place], [place] [address]   Body fluid culture     Status: None (Preliminary result)   Collection Time: 03/27/20 11:53 AM   Specimen: BILE; Body Fluid  Result Value Ref Range Status   Specimen Description   Final    BILE Performed at [facility] , [address], [place], [place] [address]    Special Requests NONE  Final   Gram Stain NO WBC SEEN NO ORGANISMS SEEN   Final   Culture   Final    NO GROWTH < 24 HOURS Performed at [facility], [address] [place], [place] [address]    Report Status PENDING  Incomplete         Radiology Studies: US RENAL  Result Date: 03/28/2020 CLINICAL DATA:  Acute on chronic renal failure EXAM: RENAL / URINARY TRACT ULTRASOUND COMPLETE COMPARISON:  None. FINDINGS: Right Kidney: Renal measurements: 8.6 x 3.8 x 3.7 cm. = volume: 64 mL. Increased echogenicity is noted consistent with medical renal disease. Left Kidney: Renal measurements: 8.5 x 4.3 x 4.0 cm. = volume: 77 mL. Increased echogenicity is noted. Bladder: Decompressed by Foley catheter Other: None. IMPRESSION: Increased echogenicity consistent with medical renal disease. No other focal abnormality is noted. Electronically Signed   By: Alcide Clever M.D.   On: 03/28/2020 13:44   Scheduled Meds: . Chlorhexidine Gluconate Cloth  6 each Topical Daily  . enoxaparin  (LOVENOX) injection  30 mg Subcutaneous Q24H  . sodium chloride flush  5 mL Intracatheter Q8H   Continuous Infusions: . ceFEPime (MAXIPIME) IV 1 g (03/28/20 1625)  . metronidazole 500 mg (03/29/20 0219)     LOS: 6 days   Time spent:  Azucena Fallen, DO [facility]  If 7PM-7AM, please contact night-coverage [email or web address]  03/29/2020, 7:32 AM

## 2020-03-29 NOTE — Progress Notes (Signed)
[facility] Progress Note  Subjective: poor UOP yest on ly 30 cc recorded, creat continues to rise  Vitals:   03/28/20 2053 03/29/20 0526 03/29/20 1100 03/29/20 1421  BP: (!) 100/51 (!) 112/54 132/61 (!) 117/52  Pulse: 91 76 80 78  Resp: _0 Temp: 97.9 F (36.6 C) (!) 97.5 F (36.4 C) (!) 97.3 F (36.3 C) (!) 95.6 F (35.3 C)  TempSrc: Oral Oral Oral   SpO2: 100% 97% 98% 99%  Weight:  98.9 kg    Height:        Exam: Gen obese AAF, lying flat, in no distress HOH, responds to loud voice, O x1 No jvd or bruits Chest clear bilat to bases RRR no MRG Abd soft ntnd no mass or ascites +bs very obese GU w/ foley in place w/ small amt amber urine Ext diffuse 2+ LE and UE edema Neuro is alert, Ox person only     Home meds:  - coreg 6.25 bid/ lasix 20 bid/ Kdur 30 meq qd  - lipitor 20/ aspirin 81  - allopurinol 200 qd   - prn's/ vitamins/ supplements     UA - not sent    Renal US - not done    ABD CT noncon 4/3 - Kidneys are normal, without renal calculi, focal lesion, or hydronephrosis. Bladder is unremarkable.     Last creat in Epic is 1.5- 1.9 in Oct 2017, eGFR 36    UA 4/7 > tubid , 100 prot, rare bact, > 50 epi/ wbc, 11-20 rbc, +wbc clumps  Assessment/ Plan: 1. AoCKD 3 - baseline creat not known, most recent labs are creat 1.5- 1.8 in 2017.  CT abd shows smallish kidneys to my read, no hydro.  Cause is likely shock episode causing ATN from gallbladder infection. UOP is very poor.  Prognosis worsening from renal standpoint. Not much to offer, will follow from a distance.  2. Septic shock - resolving 3. Acute cholecystitis - sp perc chole drain 4/6, on IV abx  4. HTN/ vol  - sig edema on exam, bp meds on hold, BP's wnl 5. DNR 6. Anemia - sp prbc's 7. Chron diast CHF - no signs of resp issues     Rob Iker Nuttall 03/29/2020, 4:33 PM   Recent Labs  Lab 03/28/20 0615 03/29/20 0625  K 4.9 4.4  BUN 46* 47*  CREATININE 4.81* 5.09*  CALCIUM  7.3* 7.7*  HGB 9.8* 10.0*   Inpatient medications: . Chlorhexidine Gluconate Cloth  6 each Topical Daily  . enoxaparin (LOVENOX) injection  30 mg Subcutaneous Q24H  . sodium chloride flush  5 mL Intracatheter Q8H   . ceFEPime (MAXIPIME) IV 1 g (03/28/20 1625)  . metronidazole 500 mg (03/29/20 1114)   HYDROcodone-acetaminophen, ondansetron **OR** ondansetron (ZOFRAN) IV

## 2020-03-30 DIAGNOSIS — K810 Acute cholecystitis: Secondary | ICD-10-CM | POA: Diagnosis not present

## 2020-03-30 LAB — BODY FLUID CULTURE
Culture: NO GROWTH
Gram Stain: NONE SEEN

## 2020-03-30 NOTE — Progress Notes (Addendum)
PROGRESS NOTE    [name]  OZH:[id number] DOB: Mar 28, 1936 DOA: 04/03/2020 PCP: Jethro Bastos, MD   Brief Narrative:  [name] is a 84 y.o. female with medical history significant of clinically blind, diabetes which is diet controlled, hypertension, dCHF, CKD stage IIIb and remote history of pancreatitis who [facility], presented to the emergency department complaining of RUQ abdominal pain and vomiting for 1 day.  Patient reported after having her meal she developed severe abdominal pain, which was sharp in nature.  And started vomiting.  Later on she developed chills and had diarrhea.  Patient denies chest pain, shortness of breath, dizziness, weakness and cough. In ED: Vitals stable, CT of the abdomen revealed possible acute cholecystitis and cholelithiasis. White count 20 K with left shift, hemoglobin 10.6, creatinine of 1.86, this and total bili minimally elevated.  Renal ultrasound pending.  ER physician called as a code sepsis with sofa score of 2. Surgical team was consulted which recommended to admit to medicine and treat with antibiotic for now.  Subjective: Mental status continues to be somewhat waxing and waning but generally worsening over the past 48 hours.  Attempting to be fed this morning by family member with very poor interaction and p.o. intake, we discussed making patient n.p.o. and following with speech therapy to ensure safe feeding.  Review of systems per patient is quite limited she does follow simple commands but is unable to answer questions appropriately at this point.  Assessment & Plan:   Active Problems:   Acute cholecystitis   Coronary artery disease involving native coronary artery of native heart without angina pectoris   Acute calculous cholecystitis   Acute kidney injury superimposed on chronic kidney disease (HCC)   Chronic diastolic CHF (congestive heart failure) (HCC)   Goals of care  -Lengthy discussion this  morning with [name] at bedside about patient's worsening condition and prognosis -Admit appears to understand the patient's mental status is worsening due to kidney failure which is likely in setting of sepsis hypotension and poor p.o. intake. -Family understands that patient is not a candidate for dialysis or further aggressive treatment and at this point focusing on keeping patient comfortable as one of our top priorities. -We discussed that over the next 48 hours patient will likely continue to decline and ultimately is not expected to do well given her poor p.o. status and urine output -Palliative care was brought up but family indicated "we are not there yet" and there appears to be some misunderstanding about what palliative care entails, will continue to round daily with family about patient's prognosis and likely consult palliative care in the next 24 hours further discussion and information  Acute metabolic encephalopathy, likely multifactorial  -Patient's mental status continues to wax and wane as above likely worsening in the setting of resolving sepsis, uremia, kidney failure and p.o. intake -Speech therapy to evaluate patient's mental status today and ability to tolerate p.o. safely as patient's daughter [name] was attempting to feed the patient today with very poor response from the patient -patient has been made n.p.o., high risk for aspiration at this point  Sepsis due to acute cholecystitis, present on admission, resolving. - General surgery previously following, status post percutaneous chole drain 04/13/20 - will need to remain in place for 4 to 6 weeks unless cholecystectomy can be done in the interim per IR. Will need follow up in 6 weeks to see Dr. Donell Beers - Continue cefepime and Flagyl for coverage of  abdomen - likely 10 day course 21-Apr-2020 - BP better, afebrile, leukocytosis improving - all good prognosticator that sepsis is resolving but her renal failure appears to be her major  issue now -Fluids discontinued as below given volume status  Acute on CKD stage IIIb  with worsening urine output, anuric at this point, ongoing - Poor p.o. intake in the setting of above likely exacerbating CKD 3B  -Creatinine continues to worsen despite being previously on IV fluids, nephrology following, appreciate insight and recommendations -she is not a candidate for dialysis, family is aware and understands -Currently discontinue IV fluids given respiratory rate, profound edema - Unclear if this is simply prerenal due to poor volume status or worsening intra-abdominal infection/sepsis - Baseline appears to be around 1.5 per chart review - Foley catheter to follow UOP more accurately -essentially anuric over the past 48 hours Lab Results  Component Value Date   CREATININE 5.09 (H) 03/29/2020   CREATININE 4.81 (H) 03/28/2020   CREATININE 4.36 (H) 03/27/2020   HTN, essential - currently hypotensive but improving - Discontinue carvedilol, Lasix previously on hold given AKI - Follow clinically, blood pressure minimally improving now off IV fluids as above - Patient's chronic blood pressure appears to be around 100/60 per distant chart review  Anemia, likely acute hemodilutional anemia on chronic anemia of chronic disease -Transfused 1 unit PRBC 03/27/2020 given worsening anemia, renal function, volume status - Hemoglobin improved more drastically than expected consistent with hypovolemia CBC Latest Ref Rng & Units 03/29/2020 03/28/2020 03/27/2020  WBC 4.0 - 10.5 K/uL 7.4 9.8 10.4  Hemoglobin 12.0 - 15.0 g/dL 10.0(L) 9.8(L) 7.2(L)  Hematocrit 36.0 - 46.0 % 29.8(L) 30.1(L) 22.3(L)  Platelets 150 - 400 K/uL 106(L) 112(L) 114(L)   Chronic diastolic CHF  - Continues to be moderately hypervolemic exacerbated by kidney failure as above - Cardiology following at the request of surgery, echocardiogram as below -Fluids discontinued as above  DVT prophylaxis: Lovenox   Code Status: DNR  Family  Communication: Daughter [name] updated at bedside Disposition Plan: Daughter [name] continues to request follow-up for SNF placement, we continue to explain that as patient clinically worsens she would likely not be a candidate for SNF placement given her poor p.o. intake, worsening kidney failure, worsening mental status.  We discussed that she is likely only going to continue to decline at this point given her chronic comorbid conditions as above, acute infection, renal failure and volume overload.  See goals of care as above, family hesitant to discuss with palliative care. Admission status: Inpatient, Medsurg - continues to require further monitoring in the setting of sepsis, acute cholecystitis, hypotension, requiring IV fluids, IV antibiotics and IV pain medication.  Consultants:   General Surgery  Cardiology  IR  Nephrology  Procedures:   IR placed perc drain of gallbladder 04/16/2020  Antimicrobials:  Cefepime, Flagyl 04/12/2020--> likely 10 day course through 2020-04-21  Objective: Vitals:   03/29/20 1421 03/29/20 2044 03/30/20 0519 03/30/20 0606  BP: (!) 117/52 119/76 (!) 136/52   Pulse: 78 80 80   Resp: 14 19 19    Temp: (!) 95.6 F (35.3 C) 97.7 F (36.5 C) 97.8 F (36.6 C)   TempSrc:  Oral Oral   SpO2: 99% 98% 98%   Weight:    98.6 kg  Height:        Intake/Output Summary (Last 24 hours) at 03/30/2020 0730 Last data filed at 03/29/2020 2134 Gross per 24 hour  Intake 10 ml  Output --  Net 10 ml  Filed Weights   03/28/20 0450 03/29/20 0526 03/30/20 0606  Weight: 95.9 kg 98.9 kg 98.6 kg    Examination:  General:  Pleasantly resting in bed, No acute distress. Somnolent but arousable to voice, difficulty answering complex questions but follows simple commands, alert to person only at this point HEENT:  Normocephalic atraumatic.  Sclerae nonicteric, noninjected.  Extraocular movements intact bilaterally. Neck:  Without mass or deformity.  Trachea is midline. Lungs:  Clear  to auscultate bilaterally without rhonchi, wheeze, or rales. Heart:  Regular rate and rhythm.  Without murmurs, rubs, or gallops. Abdomen:  Soft, moderate tenderness generally but PMI right upper quadrant; percutaneous drain bandage clean dry intact Extremities: Bilateral upper extremities 2+ pitting edema in all 4 extremities  Vascular:  Dorsalis pedis and posterior tibial pulses palpable bilaterally. Skin:  Warm and dry, no erythema, no ulcerations.   Data Reviewed: I have personally reviewed following labs and imaging studies  CBC: Recent Labs  Lab 04/19/2020 1147 03/24/20 0550 03/25/20 0115 03/25/2020 0555 03/27/20 0554 03/28/20 0615 03/29/20 0625  WBC 20.7*   < > 20.0* 12.5* 10.4 9.8 7.4  NEUTROABS 17.7*  --   --   --   --   --   --   HGB 10.6*   < > 7.9* 8.0* 7.2* 9.8* 10.0*  HCT 33.8*   < > 25.2* 24.8* 22.3* 30.1* 29.8*  MCV 105.6*   < > 106.3* 105.1* 105.7* 95.9 96.1  PLT 154   < > 114* 106* 114* 112* 106*   < > = values in this interval not displayed.   Basic Metabolic Panel: Recent Labs  Lab 04/14/2020 0555 03/21/2020 1210 03/27/20 0554 03/28/20 0615 03/29/20 0625  NA 137 136 138 139 141  K 4.0 4.7 4.1 4.9 4.4  CL 109 106 111 111 109  CO2 20* 19* 18* 14* 14*  GLUCOSE 78 73 93 72 67*  BUN 37* 39* 41* 46* 47*  CREATININE 3.94* 4.06* 4.36* 4.81* 5.09*  CALCIUM 7.3* 7.2* 7.3* 7.3* 7.7*   GFR: Estimated Creatinine Clearance: 9.7 mL/min (A) (by C-G formula based on SCr of 5.09 mg/dL (H)). Liver Function Tests: Recent Labs  Lab 03/25/20 0115 04/12/2020 0555 03/27/20 0554 03/28/20 0615 03/29/20 0625  AST 46* 46* 48* 62* 61*  ALT 38 27 21 19 21   ALKPHOS 63 78 81 109 125  BILITOT 1.5* 1.4* 0.9 1.2 1.0  PROT 5.1* 5.1* 4.7* 4.9* 5.1*  ALBUMIN 2.4* 2.5* 2.1* 2.1* 2.0*   Recent Labs  Lab 04/12/2020 1147  LIPASE 23   No results for input(s): AMMONIA in the last 168 hours. Coagulation Profile: Recent Labs  Lab 04/19/2020 1532  INR 1.2   Cardiac Enzymes: No  results for input(s): CKTOTAL, CKMB, CKMBINDEX, TROPONINI in the last 168 hours. BNP (last 3 results) No results for input(s): PROBNP in the last 8760 hours. HbA1C: No results for input(s): HGBA1C in the last 72 hours. CBG: Recent Labs  Lab 03/24/20 2139 03/25/20 0600  GLUCAP 142* 94   Lipid Profile: No results for input(s): CHOL, HDL, LDLCALC, TRIG, CHOLHDL, LDLDIRECT in the last 72 hours. Thyroid Function Tests: No results for input(s): TSH, T4TOTAL, FREET4, T3FREE, THYROIDAB in the last 72 hours. Anemia Panel: No results for input(s): VITAMINB12, FOLATE, FERRITIN, TIBC, IRON, RETICCTPCT in the last 72 hours. Sepsis Labs: Recent Labs  Lab 03/31/2020 1812 04/14/2020 2303 03/24/20 2141 03/25/20 0115  LATICACIDVEN 2.4* 1.5 2.9* 1.7    Recent Results (from the past 240 hour(s))  Blood  Culture (routine x 2)     Status: None   Collection Time: 04/07/2020  3:32 PM   Specimen: BLOOD  Result Value Ref Range Status   Specimen Description   Final    BLOOD LEFT ANTECUBITAL Performed at [facility], [address], [place], [place] [address]    Special Requests   Final    BOTTLES DRAWN AEROBIC AND ANAEROBIC Blood Culture results may not be optimal due to an inadequate volume of blood received in culture bottles Performed at [facility], [address] [place], [place] [address]    Culture   Final    NO GROWTH 5 DAYS Performed at [facility], [address] [place], [place] [address]    Report Status 03/28/2020 FINAL  Final  Respiratory Panel by RT PCR (Flu A&B, Covid) - Nasopharyngeal Swab     Status: None   Collection Time: 04/14/2020  3:35 PM   Specimen: Nasopharyngeal Swab  Result Value Ref Range Status   SARS Coronavirus 2 by RT PCR NEGATIVE NEGATIVE Final    Comment: (NOTE) SARS-CoV-2 target nucleic acids are NOT DETECTED. The SARS-CoV-2 RNA is generally detectable in upper respiratoy specimens during the acute phase of  infection. The lowest concentration of SARS-CoV-2 viral copies this assay can detect is 131 copies/mL. A negative result does not preclude SARS-Cov-2 infection and should not be used as the sole basis for treatment or other patient management decisions. A negative result may occur with  improper specimen collection/handling, submission of specimen other than nasopharyngeal swab, presence of viral mutation(s) within the areas targeted by this assay, and inadequate number of viral copies (<131 copies/mL). A negative result must be combined with clinical observations, patient history, and epidemiological information. The expected result is Negative. Fact Sheet for Patients:  https://www.moore.com/ Fact Sheet for Healthcare Providers:  https://www.young.biz/ This test is not yet ap proved or cleared by the [place] FDA and  has been authorized for detection and/or diagnosis of SARS-CoV-2 by FDA under an Emergency Use Authorization (EUA). This EUA will remain  in effect (meaning this test can be used) for the duration of the COVID-19 declaration under Section 564(b)(1) of the Act, 21 U.S.C. section 360bbb-3(b)(1), unless the authorization is terminated or revoked sooner.    Influenza A by PCR NEGATIVE NEGATIVE Final   Influenza B by PCR NEGATIVE NEGATIVE Final    Comment: (NOTE) The Xpert Xpress SARS-CoV-2/FLU/RSV assay is intended as an aid in  the diagnosis of influenza from Nasopharyngeal swab specimens and  should not be used as a sole basis for treatment. Nasal washings and  aspirates are unacceptable for Xpert Xpress SARS-CoV-2/FLU/RSV  testing. Fact Sheet for Patients: https://www.moore.com/ Fact Sheet for Healthcare Providers: https://www.young.biz/ This test is not yet approved or cleared by the [place] FDA and  has been authorized for detection and/or diagnosis of SARS-CoV-2 by  FDA under  an Emergency Use Authorization (EUA). This EUA will remain  in effect (meaning this test can be used) for the duration of the  Covid-19 declaration under Section 564(b)(1) of the Act, 21  U.S.C. section 360bbb-3(b)(1), unless the authorization is  terminated or revoked. Performed at [facility], [address] [place], [place] [address]   Blood Culture (routine x 2)     Status: None   Collection Time: 04/12/2020  3:37 PM   Specimen: BLOOD RIGHT HAND  Result Value Ref Range Status   Specimen Description   Final  BLOOD RIGHT HAND Performed at [facility], [place] [address] [place], [place] [address]    Special Requests   Final    BOTTLES DRAWN AEROBIC AND ANAEROBIC Blood Culture adequate volume Performed at [place] [address] [place], [place] [address]    Culture   Final    NO GROWTH 5 DAYS Performed at [facility], [place] [address], [place], [place] [address]    Report Status 03/28/2020 FINAL  Final  Urine culture     Status: None   Collection Time: 03/24/20 12:34 AM   Specimen: In/Out Cath Urine  Result Value Ref Range Status   Specimen Description   Final    IN/OUT CATH URINE Performed at [place] [address], [place], [place] [address]    Special Requests   Final    NONE Performed at [facility], [place] [address] [place], [place] [address]    Culture   Final    NO GROWTH Performed at [facility], [place] [address] [place], [place] [address]    Report Status 03/25/2020 FINAL  Final  Surgical pcr screen     Status: None   Collection Time: 03/25/20  7:44 PM   Specimen: Nasal Mucosa; Nasal Swab  Result Value Ref Range Status   MRSA, PCR NEGATIVE NEGATIVE Final   Staphylococcus aureus NEGATIVE NEGATIVE Final    Comment: (NOTE) The Xpert SA Assay (FDA approved for NASAL specimens in patients 68 years of age and older), is one component of a  comprehensive surveillance program. It is not intended to diagnose infection nor to guide or monitor treatment. Performed at [facility], [place] [address], [place], [place] [address]   Body fluid culture     Status: None (Preliminary result)   Collection Time: 03/27/20 11:53 AM   Specimen: BILE; Body Fluid  Result Value Ref Range Status   Specimen Description   Final    BILE Performed at [place] [address], [place], [place] [address]    Special Requests NONE  Final   Gram Stain NO WBC SEEN NO ORGANISMS SEEN   Final   Culture   Final    NO GROWTH 2 DAYS Performed at [facility], [place] [address] [place], [place] [address]    Report Status PENDING  Incomplete         Radiology Studies: US RENAL  Result Date: 03/28/2020 CLINICAL DATA:  Acute on chronic renal failure EXAM: RENAL / URINARY TRACT ULTRASOUND COMPLETE COMPARISON:  None. FINDINGS: Right Kidney: Renal measurements: 8.6 x 3.8 x 3.7 cm. = volume: 64 mL. Increased echogenicity is noted consistent with medical renal disease. Left Kidney: Renal measurements: 8.5 x 4.3 x 4.0 cm. = volume: 77 mL. Increased echogenicity is noted. Bladder: Decompressed by Foley catheter Other: None. IMPRESSION: Increased echogenicity consistent with medical renal disease. No other focal abnormality is noted. Electronically Signed   By: Inez Catalina M.D.   On: 03/28/2020 13:44   Scheduled Meds: . Chlorhexidine Gluconate Cloth  6 each Topical Daily  . enoxaparin (LOVENOX) injection  30 mg Subcutaneous Q24H  . sodium chloride flush  5 mL Intracatheter Q8H   Continuous Infusions: . ceFEPime (MAXIPIME) IV 1 g (03/29/20 2125)  . metronidazole 500 mg (03/30/20 0247)     LOS: 7 days   Time spent: 9min  Mamta Rimmer C Gearldean Lomanto, DO [facility]  If 7PM-7AM, please contact night-coverage [email or web address]  03/30/2020, 7:30 AM

## 2020-03-31 DIAGNOSIS — I5032 Chronic diastolic (congestive) heart failure: Secondary | ICD-10-CM | POA: Diagnosis not present

## 2020-03-31 DIAGNOSIS — I2510 Atherosclerotic heart disease of native coronary artery without angina pectoris: Secondary | ICD-10-CM | POA: Diagnosis not present

## 2020-03-31 DIAGNOSIS — N179 Acute kidney failure, unspecified: Secondary | ICD-10-CM | POA: Diagnosis not present

## 2020-03-31 LAB — BASIC METABOLIC PANEL
Anion gap: 13 (ref 5–15)
BUN: 59 mg/dL — ABNORMAL HIGH (ref 8–23)
CO2: 14 mmol/L — ABNORMAL LOW (ref 22–32)
Calcium: 7.9 mg/dL — ABNORMAL LOW (ref 8.9–10.3)
Chloride: 115 mmol/L — ABNORMAL HIGH (ref 98–111)
Creatinine, Ser: 6.01 mg/dL — ABNORMAL HIGH (ref 0.44–1.00)
GFR calc Af Amer: 7 mL/min — ABNORMAL LOW (ref >60)
GFR calc non Af Amer: 6 mL/min — ABNORMAL LOW (ref >60)
Glucose, Bld: 87 mg/dL (ref 70–99)
Potassium: 3.9 mmol/L (ref 3.5–5.1)
Sodium: 142 mmol/L (ref 135–145)

## 2020-03-31 LAB — CBC
HCT: 33 % — ABNORMAL LOW (ref 36.0–46.0)
Hemoglobin: 10.6 g/dL — ABNORMAL LOW (ref 12.0–15.0)
MCH: 31.2 pg (ref 26.0–34.0)
MCHC: 32.1 g/dL (ref 30.0–36.0)
MCV: 97.1 fL (ref 80.0–100.0)
Platelets: 97 10*3/uL — ABNORMAL LOW (ref 150–400)
RBC: 3.4 MIL/uL — ABNORMAL LOW (ref 3.87–5.11)
RDW: 17.3 % — ABNORMAL HIGH (ref 11.5–15.5)
WBC: 8.4 10*3/uL (ref 4.0–10.5)
nRBC: 0 % (ref 0.0–0.2)

## 2020-03-31 NOTE — Progress Notes (Addendum)
PROGRESS NOTE    [name]  POL:[id number] DOB: 1936-06-07 DOA: 03/29/2020 PCP: Jethro Bastos, MD   Brief Narrative:  [name] is a 84 y.o. female with medical history significant of clinically blind, diabetes which is diet controlled, hypertension, dCHF, CKD stage IIIb and remote history of pancreatitis who [facility], presented to the emergency department complaining of RUQ abdominal pain and vomiting for 1 day.  Patient reported after having her meal she developed severe abdominal pain, which was sharp in nature.  And started vomiting.  Later on she developed chills and had diarrhea.  Patient denies chest pain, shortness of breath, dizziness, weakness and cough. In ED: Vitals stable, CT of the abdomen revealed possible acute cholecystitis and cholelithiasis. White count 20 K with left shift, hemoglobin 10.6, creatinine of 1.86, this and total bili minimally elevated.  Renal ultrasound pending.  ER physician called as a code sepsis with sofa score of 2. Surgical team was consulted which recommended to admit to medicine and treat with antibiotic for now.  Afternoon update 03/31/20 - worsening mental status - family at bedside understands she continues to decline despite maximum medical therapy - planned to meet with palliative care tomorrow. Focus on keeping patient comfortable overnight. She remains DNR and family understands that means no aggressive therapy/intervention including but no limited to CPR, Intubation, dialysis.   Subjective: No acute issues or events overnight, mental status continues to be worsening over the past 48 hours more acutely.  [name] updated over the phone indicates the patient is no longer verbal not following commands creatinine continues to worsen as does urine output.  Family is hesitant to consult palliative care but I discussed that I would like palliative care to talk to them simply for information if nothing else no decision has  to be made during their discussion.  [name] agreed to talk to palliative care as they do want patient to return home this may be most reasonable course.  Review of systems from patient is severely limited given mental status as below.  Assessment & Plan:   Active Problems:   Acute cholecystitis   Coronary artery disease involving native coronary artery of native heart without angina pectoris   Acute calculous cholecystitis   Acute kidney injury superimposed on chronic kidney disease (HCC)   Chronic diastolic CHF (congestive heart failure) (HCC)  Goals of care  -Lengthy discussion again this morning with [name] over the phone about worsening condition and grim prognosis -We will consult palliative care for information and discussion about patient's prognosis and possibility to discharge home with palliative care and hospice as the family would like the patient to return home if at all possible.  Acute metabolic encephalopathy, ongoing, likely multifactorial  -Patient's mental status now deteriorating over the past 48 hours quite abruptly -Speech therapy asked to evaluate, patient is no longer safe for p.o. intake per their expertise  Sepsis due to acute cholecystitis, present on admission, resolving. - General surgery previously following, status post percutaneous chole drain 04/17/2020 - will need to remain in place for 4 to 6 weeks unless cholecystectomy can be done in the interim per IR. Will need follow up in 6 weeks to see Dr. Donell Beers - Continue cefepime and Flagyl for coverage of abdomen - 10 day course to complete on 04/08/2020 - BP better, afebrile, leukocytosis improving - all good prognosticator that sepsis is resolving but her renal failure appears to be her major issue now -Fluids discontinued as below  given volume status  Acute on CKD stage IIIb  with worsening urine output, anuric at this point, worsening -Patient essentially n.p.o. now unable to tolerate more IV fluids given volume  status, creatinine continues to worsen, urine output worsening as well -[place] discussion with nephrology previously patient is not a candidate for dialysis, family understands - Baseline appears to be around 1.5 per chart review - Foley catheter to follow UOP more accurately Lab Results  Component Value Date   CREATININE 6.01 (H) 03/31/2020   CREATININE 5.09 (H) 03/29/2020   CREATININE 4.81 (H) 03/28/2020   HTN, essential - currently hypotensive but stable - Discontinue carvedilol, Lasix previously on hold given AKI - Follow clinically, blood pressure minimally improving now off IV fluids as above - Patient's chronic blood pressure appears to be around 100/60 per distant chart review  Anemia, likely acute hemodilutional anemia on chronic anemia of chronic disease -Transfused 1 unit PRBC 03/27/2020 given worsening anemia, renal function, volume status - Hemoglobin improved more drastically than expected consistent with hypovolemia CBC Latest Ref Rng & Units 03/31/2020 03/29/2020 03/28/2020  WBC 4.0 - 10.5 K/uL 8.4 7.4 9.8  Hemoglobin 12.0 - 15.0 g/dL 10.6(L) 10.0(L) 9.8(L)  Hematocrit 36.0 - 46.0 % 33.0(L) 29.8(L) 30.1(L)  Platelets 150 - 400 K/uL 97(L) 106(L) 112(L)   Chronic diastolic CHF  - Continues to be hypervolemic exacerbated by kidney failure and aggressive IV fluids as above - Cardiology previously following at the request of surgery, echocardiogram as below - Fluids discontinued as above  DVT prophylaxis: Lovenox   Code Status: DNR  Family Communication: Daughter [name] updated at bedside Disposition Plan: Discussed palliative care with [name] today, will have palliative care follow for possible disposition home or to SNF with palliative care pending their discussion.   Admission status: Inpatient, Medsurg - continues to require further monitoring in the setting of sepsis, acute cholecystitis, hypotension, requiring IV fluids, IV antibiotics and IV pain medication.  Consultants:    General Surgery  Cardiology  IR  Nephrology  Palliative care  Procedures:   IR placed perc drain of gallbladder Apr 07, 2020  Antimicrobials:  Cefepime, Flagyl 03/22/2020--> tentative plan to discontinue 03/30/2020  Objective: Vitals:   03/30/20 0606 03/30/20 1448 03/30/20 2042 03/31/20 0625  BP:  (!) 130/51 (!) 92/50 (!) 105/53  Pulse:  82 80 83  Resp:  16 17 18   Temp:  98.5 F (36.9 C) 98.5 F (36.9 C) 98.4 F (36.9 C)  TempSrc:  Oral Oral Oral  SpO2:  97% 99% 94%  Weight: 98.6 kg     Height:        Intake/Output Summary (Last 24 hours) at 03/31/2020 1157 Last data filed at 03/31/2020 0625 Gross per 24 hour  Intake 10 ml  Output 35 ml  Net -25 ml   Filed Weights   03/28/20 0450 03/29/20 0526 03/30/20 0606  Weight: 95.9 kg 98.9 kg 98.6 kg    Examination:  General: Appears to be resting comfortably in bed, minimally responsive, protecting airway no acute distress. HEENT:  Normocephalic atraumatic.  Sclerae nonicteric, noninjected.   Neck:  Without mass or deformity.  Trachea is midline. Lungs: Scant bibasilar rales without overt rhonchi or wheeze Heart:  Regular rate and rhythm.  Without murmurs, rubs, or gallops. Abdomen:  Soft, nondistended.  Drain in place, bandage clean dry intact Extremities: Without cyanosis, clubbing, 3+ pitting edema in all 4 extremities Vascular:  Dorsalis pedis and posterior tibial pulses palpable bilaterally.  Data Reviewed: I have personally reviewed following labs  and imaging studies  CBC: Recent Labs  Lab Apr 23, 2020 0555 03/27/20 0554 03/28/20 0615 03/29/20 0625 03/31/20 0639  WBC 12.5* 10.4 9.8 7.4 8.4  HGB 8.0* 7.2* 9.8* 10.0* 10.6*  HCT 24.8* 22.3* 30.1* 29.8* 33.0*  MCV 105.1* 105.7* 95.9 96.1 97.1  PLT 106* 114* 112* 106* 97*   Basic Metabolic Panel: Recent Labs  Lab Apr 23, 2020 1210 03/27/20 0554 03/28/20 0615 03/29/20 0625 03/31/20 0639  NA 136 138 139 141 142  K 4.7 4.1 4.9 4.4 3.9  CL 106 111 111 109 115*   CO2 19* 18* 14* 14* 14*  GLUCOSE 73 93 72 67* 87  BUN 39* 41* 46* 47* 59*  CREATININE 4.06* 4.36* 4.81* 5.09* 6.01*  CALCIUM 7.2* 7.3* 7.3* 7.7* 7.9*   GFR: Estimated Creatinine Clearance: 8.2 mL/min (A) (by C-G formula based on SCr of 6.01 mg/dL (H)). Liver Function Tests: Recent Labs  Lab 03/25/20 0115 2020/04/23 0555 03/27/20 0554 03/28/20 0615 03/29/20 0625  AST 46* 46* 48* 62* 61*  ALT 38 27 21 19 21   ALKPHOS 63 78 81 109 125  BILITOT 1.5* 1.4* 0.9 1.2 1.0  PROT 5.1* 5.1* 4.7* 4.9* 5.1*  ALBUMIN 2.4* 2.5* 2.1* 2.1* 2.0*   No results for input(s): LIPASE, AMYLASE in the last 168 hours. No results for input(s): AMMONIA in the last 168 hours. Coagulation Profile: No results for input(s): INR, PROTIME in the last 168 hours. Cardiac Enzymes: No results for input(s): CKTOTAL, CKMB, CKMBINDEX, TROPONINI in the last 168 hours. BNP (last 3 results) No results for input(s): PROBNP in the last 8760 hours. HbA1C: No results for input(s): HGBA1C in the last 72 hours. CBG: Recent Labs  Lab 03/24/20 2139 03/25/20 0600  GLUCAP 142* 94   Lipid Profile: No results for input(s): CHOL, HDL, LDLCALC, TRIG, CHOLHDL, LDLDIRECT in the last 72 hours. Thyroid Function Tests: No results for input(s): TSH, T4TOTAL, FREET4, T3FREE, THYROIDAB in the last 72 hours. Anemia Panel: No results for input(s): VITAMINB12, FOLATE, FERRITIN, TIBC, IRON, RETICCTPCT in the last 72 hours. Sepsis Labs: Recent Labs  Lab 03/24/20 2141 03/25/20 0115  LATICACIDVEN 2.9* 1.7    Recent Results (from the past 240 hour(s))  Blood Culture (routine x 2)     Status: None   Collection Time: 03/22/2020  3:32 PM   Specimen: BLOOD  Result Value Ref Range Status   Specimen Description   Final    BLOOD LEFT ANTECUBITAL Performed at [facility], [address] [place], [place]    Special Requests   Final    BOTTLES DRAWN AEROBIC AND ANAEROBIC Blood Culture results may not be  optimal due to an inadequate volume of blood received in culture bottles Performed at [facility], [address], [place], [place]    Culture   Final    NO GROWTH 5 DAYS Performed at [facility], [address] [place], [place]    Report Status 03/28/2020 FINAL  Final  Respiratory Panel by RT PCR (Flu A&B, Covid) - Nasopharyngeal Swab     Status: None   Collection Time: 03/28/2020  3:35 PM   Specimen: Nasopharyngeal Swab  Result Value Ref Range Status   SARS Coronavirus 2 by RT PCR NEGATIVE NEGATIVE Final    Comment: (NOTE) SARS-CoV-2 target nucleic acids are NOT DETECTED. The SARS-CoV-2 RNA is generally detectable in upper respiratoy specimens during the acute phase of infection. The lowest concentration of SARS-CoV-2 viral copies this assay can  detect is 131 copies/mL. A negative result does not preclude SARS-Cov-2 infection and should not be used as the sole basis for treatment or other patient management decisions. A negative result may occur with  improper specimen collection/handling, submission of specimen other than nasopharyngeal swab, presence of viral mutation(s) within the areas targeted by this assay, and inadequate number of viral copies (<131 copies/mL). A negative result must be combined with clinical observations, patient history, and epidemiological information. The expected result is Negative. Fact Sheet for Patients:  PinkCheek.be Fact Sheet for Healthcare Providers:  GravelBags.it This test is not yet ap proved or cleared by the [place] FDA and  has been authorized for detection and/or diagnosis of SARS-CoV-2 by FDA under an Emergency Use Authorization (EUA). This EUA will remain  in effect (meaning this test can be used) for the duration of the COVID-19 declaration under Section 564(b)(1) of the Act, 21 U.S.C. section 360bbb-3(b)(1), unless the  authorization is terminated or revoked sooner.    Influenza A by PCR NEGATIVE NEGATIVE Final   Influenza B by PCR NEGATIVE NEGATIVE Final    Comment: (NOTE) The Xpert Xpress SARS-CoV-2/FLU/RSV assay is intended as an aid in  the diagnosis of influenza from Nasopharyngeal swab specimens and  should not be used as a sole basis for treatment. Nasal washings and  aspirates are unacceptable for Xpert Xpress SARS-CoV-2/FLU/RSV  testing. Fact Sheet for Patients: PinkCheek.be Fact Sheet for Healthcare Providers: GravelBags.it This test is not yet approved or cleared by the [place] FDA and  has been authorized for detection and/or diagnosis of SARS-CoV-2 by  FDA under an Emergency Use Authorization (EUA). This EUA will remain  in effect (meaning this test can be used) for the duration of the  Covid-19 declaration under Section 564(b)(1) of the Act, 21  U.S.C. section 360bbb-3(b)(1), unless the authorization is  terminated or revoked. Performed at [facility], [place] [address] [place], [place] [address]   Blood Culture (routine x 2)     Status: None   Collection Time: 04/10/2020  3:37 PM   Specimen: BLOOD RIGHT HAND  Result Value Ref Range Status   Specimen Description   Final    BLOOD RIGHT HAND Performed at [facility] [address], [place], [place] [address]    Special Requests   Final    BOTTLES DRAWN AEROBIC AND ANAEROBIC Blood Culture adequate volume Performed at [facility] [address], [place], [place] [address]    Culture   Final    NO GROWTH 5 DAYS Performed at [facility], [place] [address], [place], [place] [address]    Report Status 03/28/2020 FINAL  Final  Urine culture     Status: None   Collection Time: 03/24/20 12:34 AM   Specimen: In/Out Cath Urine  Result Value Ref Range Status   Specimen Description   Final    IN/OUT CATH  URINE Performed at [facility] [address] [place], [place] [address]    Special Requests   Final    NONE Performed at [facility], [place] [address] [place], [place] [address]    Culture   Final    NO GROWTH Performed at [facility], [place] [address] [place], [place] [address]    Report Status 03/25/2020 FINAL  Final  Surgical pcr screen     Status: None   Collection Time: 03/25/20  7:44 PM   Specimen: Nasal Mucosa; Nasal  Swab  Result Value Ref Range Status   MRSA, PCR NEGATIVE NEGATIVE Final   Staphylococcus aureus NEGATIVE NEGATIVE Final    Comment: (NOTE) The Xpert SA Assay (FDA approved for NASAL specimens in patients 74 years of age and older), is one component of a comprehensive surveillance program. It is not intended to diagnose infection nor to guide or monitor treatment. Performed at [facility], [address], [place], [place] [address]   Body fluid culture     Status: None   Collection Time: 03/27/20 11:53 AM   Specimen: BILE; Body Fluid  Result Value Ref Range Status   Specimen Description   Final    BILE Performed at [facility], [address] [place], [place] [address]    Special Requests NONE  Final   Gram Stain NO WBC SEEN NO ORGANISMS SEEN   Final   Culture   Final    NO GROWTH 3 DAYS Performed at [facility], [address] [place], [place] [address]    Report Status 03/30/2020 FINAL  Final         Radiology Studies: No results found. Scheduled Meds: . Chlorhexidine Gluconate Cloth  6 each Topical Daily  . enoxaparin (LOVENOX) injection  30 mg Subcutaneous Q24H  . sodium chloride flush  5 mL Intracatheter Q8H   Continuous Infusions: . ceFEPime (MAXIPIME) IV 1 g (03/30/20 1850)  . metronidazole 500 mg (03/31/20 1139)     LOS: 8 days   Time spent:  Azucena Fallen, DO [facility]  If 7PM-7AM, please contact  night-coverage [email or web address]  03/31/2020, 11:57 AM

## 2020-03-31 NOTE — Progress Notes (Signed)
03/31/2020  1410  Patient's son spoke with his sister and they both agreed to come meet with Dr Neale Burly tomorrow 03/22/2020 at 11am. Notified Dr Neale Burly of the planned date/time.

## 2020-03-31 NOTE — Evaluation (Signed)
Clinical/Bedside Swallow Evaluation Patient Details  Name: [name] MRN: [id number] Date of Birth: January 30, 1936  Today's Date: 03/31/2020 Time: SLP Start Time (ACUTE ONLY): 0750 SLP Stop Time (ACUTE ONLY): 0803 SLP Time Calculation (min) (ACUTE ONLY): 13 min  Past Medical History:  Past Medical History:  Diagnosis Date  . Anxiety   . Arthritis   . Blood transfusion without reported diagnosis   . Chronic diastolic CHF (congestive heart failure) ([place])   . Coronary artery disease    LHC 05/25/06: No renal artery stenosis, proximal LAD 40%, proximal D1 40%  . Diabetes mellitus    a1c 7.3 in 10/2011  . GERD (gastroesophageal reflux disease)   . GLAUCOMA 12/06/2008   Vision Loss right eye   . HYPERLIPIDEMIA 12/27/2006  . Hypertension   . NICM (nonischemic cardiomyopathy) ([place])   . OSA (obstructive sleep apnea) 03/04/2012  . Proliferative diabetic retinopathy 01/28/2009   Severe Disease with Near Complete Blindness.    . Schizophrenia ([place])   . Stage III chronic kidney disease 01/10/2007  . Systolic heart failure ([place])    Echo 10/09: EF 30-35%. Echo 2/13: EF 50-55%.  . Visual hallucinations 07/15/2011   Past Surgical History:  Past Surgical History:  Procedure Laterality Date  . EYE SURGERY  2012   both eyes  . IR PERC CHOLECYSTOSTOMY  04-10-2020   HPI:  [name] is a 84 y.o. female with medical history significant of clinically blind, diabetes which is diet controlled, hypertension, dCHF, CKD stage IIIb and remote history of pancreatitis who lives at [facility] nursing [facility], presented to the emergency department complaining of RUQ abdominal pain and vomiting for 1 day.   Assessment / Plan / Recommendation Clinical Impression  Pt was seen for a bedside swallow evaluation.  She was lethargic throughout this session and she required continuous max verbal and tactile cues to maintain alertness.  She was able to follow simple commands given tactile cues for labial opening and  closure around the spoon and straw and she drew liquid independently from the straw.  Oral mechanism exam was limited, but a white coating was observed on the pt's tongue concerning for thrush and she was noted to be edentulous.  Pt was seen with trials of ice chips, thin liquid (tsp/straw),  and puree.  She appeared to have prolonged AP transport with decreased lingual coordination.  Suspect delayed swallow initiation with all trials.  An immediate cough was observed following 1/2 tsp of thin liquid and 1/5 straw sips of thin liquid.  A delayed cough/throat clear was observed following 2/5 straw sips of thin liquid and following 2/2 tsp of puree.  Pt additionally exhibited eructation intermittently after PO trials.  Following PO trials, pt was observed to have additional swallows, possibly indicative of pharyngeal residue vs esophageal dysfunction.  She also exhibited throat clearing and wet vocal quality at least 10 minutes following PO trials, which may indicate that she is aspirating her secretions.  Due to lethargy and clinical s/sx of aspiration with PO trials, recommend that pt remain NPO at this time with medications administered via alternative means or crushed in puree if necessary.  SLP will f/u in the next 24-32 hours to determine readiness for clinical diet initiation vs instrumental swallow study.   SLP Visit Diagnosis: Dysphagia, unspecified (R13.10)    Aspiration Risk  Moderate aspiration risk    Diet Recommendation NPO;Ice chips PRN after oral care   Liquid Administration via: Spoon Medication Administration: Via alternative means(or crushed with puree )  Supervision: Full supervision/cueing for compensatory strategies;Staff to assist with self feeding Compensations: Minimize environmental distractions;Slow rate;Small sips/bites Postural Changes: Seated upright at 90 degrees;Remain upright for at least 30 minutes after po intake    Other  Recommendations Oral Care Recommendations: Oral  care QID;Oral care prior to ice chip/H20;Staff/trained caregiver to provide oral care Other Recommendations: Remove water pitcher;Have oral suction available   Follow up Recommendations Skilled Nursing facility      Frequency and Duration min 2x/week  2 weeks       Prognosis Prognosis for Safe Diet Advancement: Fair Barriers to Reach Goals: Cognitive deficits      Swallow Study   General Date of Onset: 03/31/20 HPI: [name] is a 84 y.o. female with medical history significant of clinically blind, diabetes which is diet controlled, hypertension, dCHF, CKD stage IIIb and remote history of pancreatitis who lives at base nursing facility, presented to the emergency department complaining of RUQ abdominal pain and vomiting for 1 day. Type of Study: Bedside Swallow Evaluation Previous Swallow Assessment: None Diet Prior to this Study: NPO Temperature Spikes Noted: No Respiratory Status: Room air History of Recent Intubation: No Behavior/Cognition: Lethargic/Drowsy;Requires cueing Oral Cavity Assessment: Dry Oral Care Completed by SLP: No Oral Cavity - Dentition: Edentulous Patient Positioning: Upright in bed Baseline Vocal Quality: Normal Volitional Cough: Weak Volitional Swallow: Unable to elicit    Oral/Motor/Sensory Function Overall Oral Motor/Sensory Function: (Pt unable to follow commands for full OME )   Ice Chips Ice chips: Impaired Presentation: Spoon Oral Phase Impairments: Reduced lingual movement/coordination Oral Phase Functional Implications: Prolonged oral transit Pharyngeal Phase Impairments: Suspected delayed Swallow   Thin Liquid Thin Liquid: Impaired Presentation: Spoon;Straw Oral Phase Functional Implications: Prolonged oral transit Pharyngeal  Phase Impairments: Suspected delayed Swallow;Multiple swallows;Throat Clearing - Delayed;Cough - Delayed;Cough - Immediate    Nectar Thick Nectar Thick Liquid: Not tested   Honey Thick Honey Thick Liquid:  Not tested   Puree Puree: Impaired Presentation: Spoon Oral Phase Impairments: Reduced lingual movement/coordination Oral Phase Functional Implications: Prolonged oral transit Pharyngeal Phase Impairments: Multiple swallows;Suspected delayed Swallow;Throat Clearing - Delayed;Cough - Delayed   Solid     Solid: Not tested     Villa Herb M.S., CCC-SLP Acute Rehabilitation Services Office: [phone]  Shanon Rosser Giuseppina Quinones 03/31/2020,8:25 AM

## 2020-03-31 NOTE — Progress Notes (Signed)
Yellow MEWS   Patient VS indicate yellow mews due to RR and LOC.  VS  BP 134/69 HR 84 RR 21 O2 97 % Temp 98.3 axillary  LOC - responds to pain.   Patient LOC has been progressively getting worse per Addison Naegeli, RN/ Addison Naegeli, RN stated that she had spoke w/ Dr. Natale Milch earlier today and he stated that she is worse today than yesterday. Family has a planned palliative meeting w/ Dr. Natale Milch for Monday 05/01/20 at 11:00 AM. Addison Naegeli, RN notified Dr. Natale Milch of yellow mews. No new orders at this time.  Notified charge Nurse Virl Axe, RN. She stated that since this isn't a change MEWS doesn't need to be initiated at this time.

## 2020-03-31 NOTE — Evaluation (Signed)
Speech Language Pathology Evaluation Patient Details Name: [name] MRN: [id number] DOB: 09-Nov-1936 Today's Date: 03/31/2020 Time: 4403-4742 SLP Time Calculation (min) (ACUTE ONLY): 12 min  Problem List:  Patient Active Problem List   Diagnosis Date Noted  . Chronic diastolic CHF (congestive heart failure) ([place])   . Acute kidney injury superimposed on chronic kidney disease ([place]) 04/08/2020  . Acute calculous cholecystitis   . Coronary artery disease involving native coronary artery of native heart without angina pectoris   . Acute cholecystitis 03/28/2020  . Drug-induced acute pancreatitis without infection or necrosis   . Muscle weakness (generalized)   . Hypokalemia   . C. difficile diarrhea 09/18/2016  . Leukocytosis 09/18/2016  . Acute renal failure (ARF) ([place]) 09/18/2016  . Anemia in stage 3 chronic kidney disease 11/06/2014  . Acute encephalopathy 11/05/2014  . Diabetes ([place]) 10/04/2014  . Type II or unspecified type diabetes mellitus with renal manifestations, uncontrolled(250.42) 10/13/2013  . Gout, unspecified 09/14/2013  . Urinary tract infection, site not specified 08/31/2013  . Edema 07/06/2013  . Encounter for long-term (current) use of other medications 07/06/2013  . Anemia in chronic kidney disease 07/05/2013  . 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 Type II or unspecified type diabetes mellitus with renal manifestations, not stated as uncontrolled(250.40) 06/02/2013  . Unspecified constipation 04/20/2013  . Psychosis ([place]) 04/20/2013  . Right ankle sprain 12/30/2012  . Edema of foot 12/28/2012  . Acute on chronic renal failure ([place]) 12/27/2012  . Nonischemic cardiomyopathy ([place]) 12/27/2012  . Dysuria 12/27/2012  . UTI (lower urinary tract infection) 09/10/2012  . Chronic diastolic congestive heart failure ([place]) 04/01/2012  . OSA (obstructive sleep apnea) 03/04/2012  . Preventative health care 11/19/2011  . Insomnia 08/04/2011  .  Boil of buttock 08/04/2011  . Visual hallucinations 07/15/2011  . Proliferative diabetic retinopathy ([place]) 01/28/2009  . GLAUCOMA 12/06/2008  . Coronary atherosclerosis 10/19/2008  . ALLERGIC RHINITIS 01/03/2008  . Stage III chronic kidney disease 01/10/2007  . Type 2 diabetes mellitus with blindness and proliferative retinopathy ([place]) 12/27/2006  . HYPERLIPIDEMIA 12/27/2006  . Essential hypertension 12/27/2006  . Chronic diastolic heart failure ([place]) 12/27/2006   Past Medical History:  Past Medical History:  Diagnosis Date  . Anxiety   . Arthritis   . Blood transfusion without reported diagnosis   . Chronic diastolic CHF (congestive heart failure) ([place])   . Coronary artery disease    LHC 05/25/06: No renal artery stenosis, proximal LAD 40%, proximal D1 40%  . Diabetes mellitus    a1c 7.3 in 10/2011  . GERD (gastroesophageal reflux disease)   . GLAUCOMA 12/06/2008   Vision Loss right eye   . HYPERLIPIDEMIA 12/27/2006  . Hypertension   . NICM (nonischemic cardiomyopathy) ([place])   . OSA (obstructive sleep apnea) 03/04/2012  . Proliferative diabetic retinopathy 01/28/2009   Severe Disease with Near Complete Blindness.    . Schizophrenia ([place])   . Stage III chronic kidney disease 01/10/2007  . Systolic heart failure ([place])    Echo 10/09: EF 30-35%. Echo 2/13: EF 50-55%.  . Visual hallucinations 07/15/2011   Past Surgical History:  Past Surgical History:  Procedure Laterality Date  . EYE SURGERY  2012   both eyes  . IR PERC CHOLECYSTOSTOMY  03/21/2020   HPI:  [name] is a 84 y.o. female with medical history significant of clinically blind, diabetes which is diet controlled, hypertension, dCHF, CKD stage IIIb and remote history of pancreatitis [facility], presented to  the emergency department complaining of RUQ abdominal pain and vomiting for 1 day.   Assessment / Plan / Recommendation Clinical Impression  Pt was seen for a cognitive-linguistic  evaluation.  She was lethargic throughout this session, therefore the evaluation was abbreviated.  Pt followed basic 1 step commands with approximately 50% accuracy given tactile cues.  She answered basic biographical yes/no questions with 25% accuracy.  She was only noted to grunt and say "yeah" in response to SLP questions during this evaluation.  She was unable to repeat 1 syllable words or participate in automatic speech tasks.  She appeared to have decreased sustained attention in addition to language deficits.  Recommend additional diagnostic treatment when pt is more awake/alert.      SLP Assessment  SLP Recommendation/Assessment: Patient needs continued Speech Lanaguage Pathology Services SLP Visit Diagnosis: Cognitive communication deficit (R41.841)    Follow Up Recommendations  Skilled Nursing facility    Frequency and Duration min 2x/week  2 weeks      SLP Evaluation Cognition  Overall Cognitive Status: No family/caregiver present to determine baseline cognitive functioning Arousal/Alertness: Lethargic Orientation Level: Disoriented X4 Attention: Sustained Sustained Attention: Impaired Sustained Attention Impairment: Verbal basic       Comprehension  Auditory Comprehension Overall Auditory Comprehension: Impaired Yes/No Questions: Impaired Basic Biographical Questions: 0-25% accurate Commands: Impaired One Step Basic Commands: 25-49% accurate Reading Comprehension Reading Status: Not tested    Expression Expression Primary Mode of Expression: Verbal Verbal Expression Overall Verbal Expression: Impaired Level of Generative/Spontaneous Verbalization: Word Repetition: Impaired Level of Impairment: Word level Written Expression Written Expression: Not tested   Oral / Motor  Oral Motor/Sensory Function Overall Oral Motor/Sensory Function: Other (comment)(Pt unable to follow commands for full OME ) Motor Speech Overall Motor Speech: Other (comment)(Difficult to  evaluate )   GO                   Villa Herb., M.S., CCC-SLP Acute Rehabilitation Services Office: [phone]  Shanon Rosser [facility] 03/31/2020, 8:37 AM

## 2020-04-01 DIAGNOSIS — N189 Chronic kidney disease, unspecified: Secondary | ICD-10-CM

## 2020-04-01 DIAGNOSIS — N179 Acute kidney failure, unspecified: Secondary | ICD-10-CM | POA: Diagnosis not present

## 2020-04-01 DIAGNOSIS — Z515 Encounter for palliative care: Secondary | ICD-10-CM | POA: Diagnosis not present

## 2020-04-01 DIAGNOSIS — Z7189 Other specified counseling: Secondary | ICD-10-CM

## 2020-04-01 DIAGNOSIS — K810 Acute cholecystitis: Secondary | ICD-10-CM | POA: Diagnosis not present

## 2020-04-01 DIAGNOSIS — K8000 Calculus of gallbladder with acute cholecystitis without obstruction: Secondary | ICD-10-CM | POA: Diagnosis not present

## 2020-04-01 DIAGNOSIS — I5032 Chronic diastolic (congestive) heart failure: Secondary | ICD-10-CM | POA: Diagnosis not present

## 2020-04-01 MED ORDER — HALOPERIDOL LACTATE 5 MG/ML IJ SOLN: 0.5000 mg | INTRAMUSCULAR | Status: DC | PRN

## 2020-04-01 MED ORDER — POLYVINYL ALCOHOL 1.4 % OP SOLN: 1.0000 [drp] | Freq: Four times a day (QID) | OPHTHALMIC | Status: DC | PRN

## 2020-04-01 MED ORDER — BIOTENE DRY MOUTH MT LIQD: 15.0000 mL | OROMUCOSAL | Status: DC | PRN

## 2020-04-01 MED ORDER — SCOPOLAMINE 1 MG/3DAYS TD PT72
1.0000 | MEDICATED_PATCH | TRANSDERMAL | Status: DC
  Administered 2020-04-01: 1.5 mg via TRANSDERMAL
  Filled 2020-04-01: qty 1

## 2020-04-01 MED ORDER — HALOPERIDOL LACTATE 2 MG/ML PO CONC
0.5000 mg | ORAL | Status: DC | PRN
  Filled 2020-04-01: qty 0.3

## 2020-04-01 MED ORDER — GLYCOPYRROLATE 1 MG PO TABS
1.0000 mg | ORAL_TABLET | ORAL | Status: DC | PRN
  Filled 2020-04-01: qty 1

## 2020-04-01 MED ORDER — HYDROMORPHONE HCL 1 MG/ML IJ SOLN: 0.5000 mg | INTRAMUSCULAR | Status: DC | PRN

## 2020-04-01 MED ORDER — GLYCOPYRROLATE 0.2 MG/ML IJ SOLN: 0.2000 mg | INTRAMUSCULAR | Status: DC | PRN

## 2020-04-01 MED ORDER — GLYCOPYRROLATE 0.2 MG/ML IJ SOLN
0.2000 mg | INTRAMUSCULAR | Status: DC | PRN
  Administered 2020-04-01: 17:00:00 0.2 mg via INTRAVENOUS
  Filled 2020-04-01: qty 1

## 2020-04-01 MED ORDER — HALOPERIDOL 0.5 MG PO TABS
0.5000 mg | ORAL_TABLET | ORAL | Status: DC | PRN
  Filled 2020-04-01: qty 1

## 2020-04-20 NOTE — Discharge Summary (Signed)
Physician Discharge Summary  [name] HYW:[id number] DOB: March 10, 1936 DOA: 04/03/2020  PCP: Janifer Adie, MD  Admit date: 03/24/2020 Discharge date: 2020/04/13  Admitted From: Home Disposition: [facility]  Discharge Condition: Grim CODE STATUS:, Comfort measures Diet recommendation: Comfort feeding only  Brief/Interim Summary [name] a 84 y.o.femalewith medical history significant ofclinically blind,diabeteswhich is diet controlled, hypertension,dCHF, CKD stage IIIb and remote history of pancreatitiswho [facility],presented to the emergency department complaining of RUQabdominal pain and vomiting for 1 day. Patient reported after having her meal she developed severe abdominal pain, which was sharp in nature. And started vomiting. Later on she developed chills and had diarrhea. Patient denies chest pain, shortness of breath, dizziness, weakness and cough. In ED: Vitals stable, CT of the abdomen revealed possible acute cholecystitis and cholelithiasis. White count 20 K with left shift,hemoglobin 10.6, creatinine of 1.86,this and total bili minimally elevated. Renal ultrasound pending. ER physician called as a code sepsis with sofa score of 2. Surgical team was consulted which recommended to admit to medicine and treat with antibiotic initially await for further clearance from medicine and cardiology teams.  Patient met as above with likely acute cholecystitis meeting sepsis criteria, unfortunately during her hospital stay patient's blood pressure and kidney function deteriorated setting of infection and became too unstable for surgical intervention, subsequently a percutaneous cholecystostomy was performed by IR draining gallbladder and patient sepsis criteria did improved somewhat over the following few days, unfortunately given patient's advanced kidney disease at baseline with worsening blood pressure and hypotension in the setting  of sepsis above patient's kidneys continued to deteriorate, urine output essentially zero periprocedure and continues to be quite poor, nephrology was consulted for further evaluation and treatment given patient's poor tolerance of IV fluids in the setting of renal failure.  We discussed the patient is not a candidate for dialysis, patient's mental status continues to worsen in the setting of uremia and kidney failure with hypervolemia.  Daily meeting with family including daughter [name] about patient's worsening condition, patient now minimally responsive, appears comfortable and we discussed that comfort would be our main goal moving forward in agreement with patient's wishes.  On 13-Apr-2020 patient was evaluated by palliative care and reasonable for discharge to [facility] with hospice, family agreeable for this care given patient's worsening status and poor prognosis.  Discharge Diagnoses:  Active Problems:   Acute cholecystitis   Coronary artery disease involving native coronary artery of native heart without angina pectoris   Acute calculous cholecystitis   Acute kidney injury superimposed on chronic kidney disease (HCC)   Chronic diastolic CHF (congestive heart failure) [facility])    Discharge Instructions  Discharge Instructions    Diet - low sodium heart healthy   Complete by: As directed    Increase activity slowly   Complete by: As directed      Allergies as of 04-13-2020      Reactions   Heparin Other (See Comments)   hallucinations     Lisinopril Cough      Medication List    STOP taking these medications   allopurinol 100 MG tablet Commonly known as: ZYLOPRIM   aspirin EC 81 MG tablet   atorvastatin 20 MG tablet Commonly known as: Lipitor   carvedilol 6.25 MG tablet Commonly known as: COREG   Cholecalciferol 1.25 MG (50000 UT) Tabs   DSS 100 MG Caps   ergocalciferol 1.25 MG (50000 UT) capsule Commonly known as: VITAMIN D2   furosemide 20  MG tablet Commonly  known as: LASIX   potassium chloride 10 MEQ tablet Commonly known as: KLOR-CON   potassium chloride SA 15 MEQ tablet Commonly known as: KLOR-CON M15   sennosides-docusate sodium 8.6-50 MG tablet Commonly known as: SENOKOT-S      Follow-up Information    Stark Klein, MD Follow up.   Specialty: General Surgery Why: call for follow up appointment in 6 weeks.   Contact information: [address] [place] [address] [phone]          Allergies  Allergen Reactions  . Heparin Other (See Comments)    hallucinations    . Lisinopril Cough    Procedures/Studies: CT ABDOMEN PELVIS WO CONTRAST  Result Date: 04/17/2020 CLINICAL DATA:  Right-sided abdominal pain. EXAM: CT ABDOMEN AND PELVIS WITHOUT CONTRAST TECHNIQUE: Multidetector CT imaging of the abdomen and pelvis was performed following the standard protocol without IV contrast. COMPARISON:  September 23, 2016 FINDINGS: Lower chest: Small bilateral pleural effusions and bibasilar atelectasis. Calcific atherosclerotic disease of the coronary arteries. Hepatobiliary: No focal liver abnormality is seen. Numerous layering gallstones. Indistinctness of the gallbladder wall with pericholecystic fat stranding and small amount of fluid tracking from the gallbladder fossa to the right flank. Pancreas: Unremarkable. No pancreatic ductal dilatation or surrounding inflammatory changes. Spleen: Normal in size without focal abnormality. Adrenals/Urinary Tract: Adrenal glands are unremarkable. Kidneys are normal, without renal calculi, focal lesion, or hydronephrosis. Bladder is unremarkable. Stomach/Bowel: Mild indistinctness and mucosal wall thickening of the second portions of the duodenum. Normal caliber of the stomach, small bowel and colon. Vascular/Lymphatic: Aortic atherosclerosis. No enlarged abdominal or pelvic lymph nodes. Reproductive: Uterus and bilateral adnexa are unremarkable. Other: No abdominal wall hernia or  abnormality. Small amount of free fluid in the anterior pelvis. Musculoskeletal: Extensive spondylosis of the lumbosacral spine. IMPRESSION: 1. Cholelithiasis with indistinctness of the gallbladder wall, pericholecystic fat stranding and small amount of fluid tracking from the gallbladder fossa to the right flank, and right pericolic gutter. Findings are suspicious for acute cholecystitis. Confirmation with right upper quadrant ultrasound may be considered, given this is a noncontrasted study and the gallbladder wall is poorly evaluated. 2. Mild indistinctness and mucosal wall thickening of the second portions of the duodenum, likely reactive. 3. Small bilateral pleural effusions and bibasilar atelectasis. 4. Calcific atherosclerotic disease of the coronary arteries. Aortic Atherosclerosis (ICD10-I70.0). These results were called by telephone at the time of interpretation on 03/27/2020 at 3:24 pm to provider Dr. Kathrynn Humble, who verbally acknowledged these results. Electronically Signed   By: Fidela Salisbury M.D.   On: 04/17/2020 15:28   US RENAL  Result Date: 03/28/2020 CLINICAL DATA:  Acute on chronic renal failure EXAM: RENAL / URINARY TRACT ULTRASOUND COMPLETE COMPARISON:  None. FINDINGS: Right Kidney: Renal measurements: 8.6 x 3.8 x 3.7 cm. = volume: 64 mL. Increased echogenicity is noted consistent with medical renal disease. Left Kidney: Renal measurements: 8.5 x 4.3 x 4.0 cm. = volume: 77 mL. Increased echogenicity is noted. Bladder: Decompressed by Foley catheter Other: None. IMPRESSION: Increased echogenicity consistent with medical renal disease. No other focal abnormality is noted. Electronically Signed   By: Inez Catalina M.D.   On: 03/28/2020 13:44   IR Perc Cholecystostomy  Result Date: 04/17/2020 INDICATION: 84 year old female with acute calculus cholecystitis. Her condition has deteriorated today concerning for worsening cholecystitis. She is no longer a surgical candidate and presents for  placement of an urgent percutaneous cholecystostomy tube. EXAM: CHOLECYSTOSTOMY MEDICATIONS: 400 mg ciprofloxacin; The antibiotic was  administered within an appropriate time frame prior to the initiation of the procedure. ANESTHESIA/SEDATION: Moderate (conscious) sedation was employed during this procedure. A total of Versed 1.5 mg and Fentanyl 50 mcg was administered intravenously. Moderate Sedation Time: 17 minutes. The patient's level of consciousness and vital signs were monitored continuously by radiology nursing throughout the procedure under my direct supervision. FLUOROSCOPY TIME:  Fluoroscopy Time: 0 minutes 36 seconds (9 mGy). COMPLICATIONS: None immediate. PROCEDURE: Informed written consent was obtained from the patient after a thorough discussion of the procedural risks, benefits and alternatives. All questions were addressed. Maximal Sterile Barrier Technique was utilized including caps, mask, sterile gowns, sterile gloves, sterile drape, hand hygiene and skin antiseptic. A timeout was performed prior to the initiation of the procedure. The right upper quadrant was interrogated with ultrasound. The dilated gallbladder was successfully identified. A suitable skin entry site was selected and marked. Local anesthesia was attained by infiltration with 1% lidocaine. A small dermatotomy was made. Under real-time ultrasound guidance, a 21 gauge Chiba needle was advanced over a rib, along a short transhepatic tract and into the gallbladder lumen. There was return of black bile. A 0.018 wire was advanced into the gallbladder. The needle was removed. A hydrophilic transitional dilator was advanced over the wire and into the gallbladder lumen. The wire and inner dilator were removed. Contrast injection was then performed opacifying the gallbladder lumen. Numerous filling defects are present consistent with cholelithiasis. A 0.035 wire was then coiled in the gallbladder lumen. The transitional dilator was removed.  The skin tract was then dilated to 10 [place] and a Cook 10.2 [place] all-purpose drainage catheter advanced over the wire and formed in the gallbladder lumen. An image was obtained and stored for the medical record. Aspiration yields dark black bile. No frank purulence. The catheter was gently flushed and connected to gravity bag drainage. The catheter was secured to the skin with 0 Prolene suture and an adhesive fixation device. The patient tolerated the procedure well. IMPRESSION: Successful placement of a 10 French percutaneous transhepatic percutaneous cholecystostomy tube for acute calculus cholecystitis and a non operative candidate. PLAN: Maintain drain to gravity bag drainage. Flush drainage catheter at least once per shift with 5 mL sterile saline. Follow-up cholangiogram at drain clinic in approximately 4 weeks. Signed, Criselda Peaches, MD, [facility] Electronically Signed   By: Jacqulynn Cadet M.D.   On: 04/04/2020 14:02   ECHOCARDIOGRAM COMPLETE  Result Date: 03/24/2020    ECHOCARDIOGRAM REPORT   Patient Name:   RALPHINE HINKS Date of Exam: 03/24/2020 Medical Rec #:  [id number]         Height:       63.0 in Accession #:    [id number]        Weight:       189.0 lb Date of Birth:  1936-06-19        BSA:          1.888 m Patient Age:    58 years          BP:           99/42 mmHg Patient Gender: F                 HR:           87 bpm. Exam Location:  Inpatient Procedure: 2D Echo, Cardiac Doppler and Color Doppler Indications:    I50.33 Acute on chronic diastolic (congestive) heart failure  History:  Patient has prior history of Echocardiogram examinations, most                 recent 02/07/2014. Cardiomyopathy, CAD; Risk                 Factors:Hypertension, Diabetes, Dyslipidemia and GERD. CKD.  Sonographer:    Jonelle Sidle Dance Referring Phys: [place] Comments: No subcostal window. IMPRESSIONS  1. Left  ventricular ejection fraction, by estimation, is 60 to 65%. The left ventricle has normal function. The left ventricle has no regional wall motion abnormalities. Left ventricular diastolic parameters are consistent with Grade II diastolic dysfunction (pseudonormalization). Elevated left atrial pressure.  2. Right ventricular systolic function is normal. The right ventricular size is normal. Tricuspid regurgitation signal is inadequate for assessing PA pressure.  3. Left atrial size was mildly dilated.  4. The mitral valve is normal in structure. Trivial mitral valve regurgitation. No evidence of mitral stenosis.  5. The aortic valve is normal in structure. Aortic valve regurgitation is not visualized. No aortic stenosis is present.  6. The inferior vena cava is normal in size with greater than 50% respiratory variability, suggesting right atrial pressure of 3 mmHg. Comparison(s): No significant change from prior study. Prior images reviewed side by side. FINDINGS  Left Ventricle: Left ventricular ejection fraction, by estimation, is 60 to 65%. The left ventricle has normal function. The left ventricle has no regional wall motion abnormalities. The left ventricular internal cavity size was normal in size. There is  borderline concentric left ventricular hypertrophy. Left ventricular diastolic parameters are consistent with Grade II diastolic dysfunction (pseudonormalization). Elevated left atrial pressure. Right Ventricle: The right ventricular size is normal. No increase in right ventricular wall thickness. Right ventricular systolic function is normal. Tricuspid regurgitation signal is inadequate for assessing PA pressure. Left Atrium: Left atrial size was mildly dilated. Right Atrium: Right atrial size was normal in size. Pericardium: There is no evidence of pericardial effusion. Mitral Valve: The mitral valve is normal in structure. Normal mobility of the mitral valve leaflets. Moderate mitral annular  calcification. Trivial mitral valve regurgitation. No evidence of mitral valve stenosis. Tricuspid Valve: The tricuspid valve is normal in structure. Tricuspid valve regurgitation is trivial. No evidence of tricuspid stenosis. Aortic Valve: The aortic valve is normal in structure. Aortic valve regurgitation is not visualized. No aortic stenosis is present. Pulmonic Valve: The pulmonic valve was normal in structure. Pulmonic valve regurgitation is not visualized. No evidence of pulmonic stenosis. Aorta: The aortic root is normal in size and structure. Venous: The inferior vena cava is normal in size with greater than 50% respiratory variability, suggesting right atrial pressure of 3 mmHg. IAS/Shunts: No atrial level shunt detected by color flow Doppler.  LEFT VENTRICLE PLAX 2D LVIDd:         4.80 cm  Diastology LVIDs:         3.50 cm  LV e' lateral:   5.11 cm/s LV PW:         1.20 cm  LV E/e' lateral: 15.1 LV IVS:        0.90 cm  LV e' medial:    5.55 cm/s LVOT diam:     1.90 cm  LV E/e' medial:  13.9 LV SV:         54 LV SV Index:   29 LVOT Area:     2.84 cm  RIGHT VENTRICLE RV Basal diam:  2.90 cm RV S prime:     12.50  cm/s TAPSE (M-mode): 2.0 cm LEFT ATRIUM             Index       RIGHT ATRIUM           Index LA diam:        3.40 cm 1.80 cm/m  RA Area:     15.30 cm LA Vol (A2C):   56.4 ml 29.88 ml/m RA Volume:   40.90 ml  21.67 ml/m LA Vol (A4C):   65.3 ml 34.59 ml/m LA Biplane Vol: 60.9 ml 32.26 ml/m  AORTIC VALVE LVOT Vmax:   93.80 cm/s LVOT Vmean:  59.100 cm/s LVOT VTI:    0.191 m  AORTA Ao Root diam: 3.10 cm Ao Asc diam:  3.10 cm MITRAL VALVE MV Area (PHT): 4.06 cm    SHUNTS MV Decel Time: 187 msec    Systemic VTI:  0.19 m MV E velocity: 77.10 cm/s  Systemic Diam: 1.90 cm MV A velocity: 81.90 cm/s MV E/A ratio:  0.94 Mihai Croitoru MD Electronically signed by Sanda Klein MD Signature Date/Time: 03/24/2020/11:47:38 AM    Final    US Abdomen Limited RUQ  Result Date: 04/04/2020 CLINICAL DATA:   Abdominal pain. EXAM: ULTRASOUND ABDOMEN LIMITED RIGHT UPPER QUADRANT COMPARISON:  Abdominal CT earlier today. FINDINGS: Gallbladder: Distended containing multiple intraluminal gallstones and sludge. There is wall thickening at 5-6 mm. Positive sonographic Murphy sign noted by sonographer. Common bile duct: Diameter: 4 mm proximally.  Distal aspect not well visualized. Liver: Technically limited and challenging exam. No focal lesion identified. Within normal limits in parenchymal echogenicity. Portal vein is patent on color Doppler imaging with normal direction of blood flow towards the liver. Other: None. IMPRESSION: 1. Sonographic findings highly suspicious for acute cholecystitis. Gallstones with wall thickening and positive sonographic Murphy sign. 2. No definite biliary dilatation. Distal common bile duct not well visualized due to bowel gas. Electronically Signed   By: Keith Rake M.D.   On: 04/13/2020 16:32    Subjective: Patient's mental status continues to worsen overnight, review of systems markedly limited as above   Discharge Exam: Vitals:   2020/04/18 0601 April 18, 2020 0610  BP:    Pulse:    Resp:    Temp:    SpO2: (!) 87% 92%   Vitals:   04-18-20 0015 Apr 18, 2020 0509 04-18-20 0601 04/18/2020 0610  BP: 128/74 136/64    Pulse: 87 99    Resp: 20 17    Temp: 99 F (37.2 C) 98.2 F (36.8 C)    TempSrc: Oral Oral    SpO2: 91% 94% (!) 87% 92%  Weight:  103.9 kg    Height:        General: Somnolent, poorly arousable, localizes to pain only. HEENT: Dry mucous membranes, otherwise normocephalic atraumatic. Neck:  Without mass or deformity.  Trachea is midline. Lungs:  Clear to auscultate bilaterally without rhonchi, wheeze, or rales. Heart:  Regular rate and rhythm.  Without murmurs, rubs, or gallops. Abdomen:  Soft, nontender, nondistended.  Without guarding or rebound.  Percutaneous drain intact without purulence or erythema at insertion site Extremities: Without cyanosis,  clubbing, edema, or obvious deformity. Vascular:  Dorsalis pedis and posterior tibial pulses palpable bilaterally.  The results of significant diagnostics from this hospitalization (including imaging, microbiology, ancillary and laboratory) are listed below for reference.     Microbiology: Recent Results (from the past 240 hour(s))  Blood Culture (routine x 2)     Status: None   Collection Time: 04/18/2020  3:32  PM   Specimen: BLOOD  Result Value Ref Range Status   Specimen Description   Final    BLOOD LEFT ANTECUBITAL Performed at [facility] [address] [place], [place] [address]    Special Requests   Final    BOTTLES DRAWN AEROBIC AND ANAEROBIC Blood Culture results may not be optimal due to an inadequate volume of blood received in culture bottles Performed at [facility] [address], [place], [place] [address]    Culture   Final    NO GROWTH 5 DAYS Performed at [facility], [place] [address], [place], [place] [address]    Report Status 03/28/2020 FINAL  Final  Respiratory Panel by RT PCR (Flu A&B, Covid) - Nasopharyngeal Swab     Status: None   Collection Time: 03/22/2020  3:35 PM   Specimen: Nasopharyngeal Swab  Result Value Ref Range Status   SARS Coronavirus 2 by RT PCR NEGATIVE NEGATIVE Final    Comment: (NOTE) SARS-CoV-2 target nucleic acids are NOT DETECTED. The SARS-CoV-2 RNA is generally detectable in upper respiratoy specimens during the acute phase of infection. The lowest concentration of SARS-CoV-2 viral copies this assay can detect is 131 copies/mL. A negative result does not preclude SARS-Cov-2 infection and should not be used as the sole basis for treatment or other patient management decisions. A negative result may occur with  improper specimen collection/handling, submission of specimen other than nasopharyngeal swab, presence of viral mutation(s) within the areas targeted by this assay, and inadequate  number of viral copies (<131 copies/mL). A negative result must be combined with clinical observations, patient history, and epidemiological information. The expected result is Negative. Fact Sheet for Patients:  PinkCheek.be Fact Sheet for Healthcare Providers:  GravelBags.it This test is not yet ap proved or cleared by the [place] FDA and  has been authorized for detection and/or diagnosis of SARS-CoV-2 by FDA under an Emergency Use Authorization (EUA). This EUA will remain  in effect (meaning this test can be used) for the duration of the COVID-19 declaration under Section 564(b)(1) of the Act, 21 U.S.C. section 360bbb-3(b)(1), unless the authorization is terminated or revoked sooner.    Influenza A by PCR NEGATIVE NEGATIVE Final   Influenza B by PCR NEGATIVE NEGATIVE Final    Comment: (NOTE) The Xpert Xpress SARS-CoV-2/FLU/RSV assay is intended as an aid in  the diagnosis of influenza from Nasopharyngeal swab specimens and  should not be used as a sole basis for treatment. Nasal washings and  aspirates are unacceptable for Xpert Xpress SARS-CoV-2/FLU/RSV  testing. Fact Sheet for Patients: PinkCheek.be Fact Sheet for Healthcare Providers: GravelBags.it This test is not yet approved or cleared by the [place] FDA and  has been authorized for detection and/or diagnosis of SARS-CoV-2 by  FDA under an Emergency Use Authorization (EUA). This EUA will remain  in effect (meaning this test can be used) for the duration of the  Covid-19 declaration under Section 564(b)(1) of the Act, 21  U.S.C. section 360bbb-3(b)(1), unless the authorization is  terminated or revoked. Performed at [facility], [place] [address], [place], [place] [address]   Blood Culture (routine x 2)     Status: None   Collection Time: 03/27/2020  3:37 PM   Specimen: BLOOD  RIGHT HAND  Result Value Ref Range Status   Specimen Description   Final    BLOOD RIGHT HAND Performed at [facility] [address], [place], [place] [address]  Special Requests   Final    BOTTLES DRAWN AEROBIC AND ANAEROBIC Blood Culture adequate volume Performed at [facility] [address] [place], [place] [address]    Culture   Final    NO GROWTH 5 DAYS Performed at [facility], [place] [address] [place], [place] [address]    Report Status 03/28/2020 FINAL  Final  Urine culture     Status: None   Collection Time: 03/24/20 12:34 AM   Specimen: In/Out Cath Urine  Result Value Ref Range Status   Specimen Description   Final    IN/OUT CATH URINE Performed at [facility] [address], [place], [place] [address]    Special Requests   Final    NONE Performed at [facility], [place] [address], [place], [place] [address]    Culture   Final    NO GROWTH Performed at [facility], [place] [address] [place], [place] [address]    Report Status 03/25/2020 FINAL  Final  Surgical pcr screen     Status: None   Collection Time: 03/25/20  7:44 PM   Specimen: Nasal Mucosa; Nasal Swab  Result Value Ref Range Status   MRSA, PCR NEGATIVE NEGATIVE Final   Staphylococcus aureus NEGATIVE NEGATIVE Final    Comment: (NOTE) The Xpert SA Assay (FDA approved for NASAL specimens in patients 20 years of age and older), is one component of a comprehensive surveillance program. It is not intended to diagnose infection nor to guide or monitor treatment. Performed at [facility], [place] [address], [place], [place] [address]   Body fluid culture     Status: None   Collection Time: 03/27/20 11:53 AM   Specimen: BILE; Body Fluid  Result Value Ref Range Status   Specimen Description   Final    BILE Performed at [facility] [address], [place], [place] [address]     Special Requests NONE  Final   Gram Stain NO WBC SEEN NO ORGANISMS SEEN   Final   Culture   Final    NO GROWTH 3 DAYS Performed at [facility], [place] [address], [place], [place] [address]    Report Status 03/30/2020 FINAL  Final     Labs: BNP (last 3 results) No results for input(s): BNP in the last 8760 hours. Basic Metabolic Panel: Recent Labs  Lab 04/18/2020 1210 03/27/20 0554 03/28/20 0615 03/29/20 0625 03/31/20 0639  NA 136 138 139 141 142  K 4.7 4.1 4.9 4.4 3.9  CL 106 111 111 109 115*  CO2 19* 18* 14* 14* 14*  GLUCOSE 73 93 72 67* 87  BUN 39* 41* 46* 47* 59*  CREATININE 4.06* 4.36* 4.81* 5.09* 6.01*  CALCIUM 7.2* 7.3* 7.3* 7.7* 7.9*   Liver Function Tests: Recent Labs  Lab 03/25/2020 0555 03/27/20 0554 03/28/20 0615 03/29/20 0625  AST 46* 48* 62* 61*  ALT '27 21 19 21  '$ ALKPHOS 78 81 109 125  BILITOT 1.4* 0.9 1.2 1.0  PROT 5.1* 4.7* 4.9* 5.1*  ALBUMIN 2.5* 2.1* 2.1* 2.0*   No results for input(s): LIPASE, AMYLASE in the last 168 hours. No results for input(s): AMMONIA in the last 168 hours. CBC: Recent Labs  Lab 04/13/2020 0555 03/27/20 0554 03/28/20 0615 03/29/20 0625 03/31/20 0639  WBC 12.5* 10.4 9.8 7.4 8.4  HGB 8.0* 7.2* 9.8* 10.0* 10.6*  HCT 24.8* 22.3* 30.1* 29.8* 33.0*  MCV 105.1* 105.7* 95.9  96.1 97.1  PLT 106* 114* 112* 106* 97*   Cardiac Enzymes: No results for input(s): CKTOTAL, CKMB, CKMBINDEX, TROPONINI in the last 168 hours. BNP: Invalid input(s): POCBNP CBG: No results for input(s): GLUCAP in the last 168 hours. D-Dimer No results for input(s): DDIMER in the last 72 hours. Hgb A1c No results for input(s): HGBA1C in the last 72 hours. Lipid Profile No results for input(s): CHOL, HDL, LDLCALC, TRIG, CHOLHDL, LDLDIRECT in the last 72 hours. Thyroid function studies No results for input(s): TSH, T4TOTAL, T3FREE, THYROIDAB in the last 72 hours.  Invalid input(s): FREET3 Anemia work up No results for input(s): VITAMINB12,  FOLATE, FERRITIN, TIBC, IRON, RETICCTPCT in the last 72 hours. Urinalysis    Component Value Date/Time   COLORURINE YELLOW 03/27/2020 1634   APPEARANCEUR TURBID (A) 03/27/2020 1634   LABSPEC 1.017 03/27/2020 1634   PHURINE 5.0 03/27/2020 1634   GLUCOSEU 50 (A) 03/27/2020 1634   GLUCOSEU NEG mg/dL 02/08/2008 2055   HGBUR MODERATE (A) 03/27/2020 1634   HGBUR negative 07/02/2009 1003   BILIRUBINUR NEGATIVE 03/27/2020 1634   KETONESUR 5 (A) 03/27/2020 1634   PROTEINUR 100 (A) 03/27/2020 1634   UROBILINOGEN 0.2 03/05/2015 1854   NITRITE NEGATIVE 03/27/2020 1634   LEUKOCYTESUR MODERATE (A) 03/27/2020 1634   Sepsis Labs Invalid input(s): PROCALCITONIN,  WBC,  LACTICIDVEN Microbiology Recent Results (from the past 240 hour(s))  Blood Culture (routine x 2)     Status: None   Collection Time: 04/18/2020  3:32 PM   Specimen: BLOOD  Result Value Ref Range Status   Specimen Description   Final    BLOOD LEFT ANTECUBITAL Performed at [facility], [place] [address], [place], [place] [address]    Special Requests   Final    BOTTLES DRAWN AEROBIC AND ANAEROBIC Blood Culture results may not be optimal due to an inadequate volume of blood received in culture bottles Performed at [place] [address], [place], [place] [address]    Culture   Final    NO GROWTH 5 DAYS Performed at [facility], [place] [address] [place], [place] [address]    Report Status 03/28/2020 FINAL  Final  Respiratory Panel by RT PCR (Flu A&B, Covid) - Nasopharyngeal Swab     Status: None   Collection Time: 03/28/2020  3:35 PM   Specimen: Nasopharyngeal Swab  Result Value Ref Range Status   SARS Coronavirus 2 by RT PCR NEGATIVE NEGATIVE Final    Comment: (NOTE) SARS-CoV-2 target nucleic acids are NOT DETECTED. The SARS-CoV-2 RNA is generally detectable in upper respiratoy specimens during the acute phase of infection. The lowest concentration of SARS-CoV-2 viral copies this  assay can detect is 131 copies/mL. A negative result does not preclude SARS-Cov-2 infection and should not be used as the sole basis for treatment or other patient management decisions. A negative result may occur with  improper specimen collection/handling, submission of specimen other than nasopharyngeal swab, presence of viral mutation(s) within the areas targeted by this assay, and inadequate number of viral copies (<131 copies/mL). A negative result must be combined with clinical observations, patient history, and epidemiological information. The expected result is Negative. Fact Sheet for Patients:  PinkCheek.be Fact Sheet for Healthcare Providers:  GravelBags.it This test is not yet ap proved or cleared by the [place] FDA and  has been authorized for detection and/or diagnosis of SARS-CoV-2 by FDA under an Emergency Use Authorization (EUA). This EUA will remain  in effect (meaning  this test can be used) for the duration of the COVID-19 declaration under Section 564(b)(1) of the Act, 21 U.S.C. section 360bbb-3(b)(1), unless the authorization is terminated or revoked sooner.    Influenza A by PCR NEGATIVE NEGATIVE Final   Influenza B by PCR NEGATIVE NEGATIVE Final    Comment: (NOTE) The Xpert Xpress SARS-CoV-2/FLU/RSV assay is intended as an aid in  the diagnosis of influenza from Nasopharyngeal swab specimens and  should not be used as a sole basis for treatment. Nasal washings and  aspirates are unacceptable for Xpert Xpress SARS-CoV-2/FLU/RSV  testing. Fact Sheet for Patients: PinkCheek.be Fact Sheet for Healthcare Providers: GravelBags.it This test is not yet approved or cleared by the [place] FDA and  has been authorized for detection and/or diagnosis of SARS-CoV-2 by  FDA under an Emergency Use Authorization (EUA). This EUA will remain  in  effect (meaning this test can be used) for the duration of the  Covid-19 declaration under Section 564(b)(1) of the Act, 21  U.S.C. section 360bbb-3(b)(1), unless the authorization is  terminated or revoked. Performed at [facility], [place] [address] [place], [place] [address]   Blood Culture (routine x 2)     Status: None   Collection Time: 04/06/2020  3:37 PM   Specimen: BLOOD RIGHT HAND  Result Value Ref Range Status   Specimen Description   Final    BLOOD RIGHT HAND Performed at [place] [address], [place], [place] [address]    Special Requests   Final    BOTTLES DRAWN AEROBIC AND ANAEROBIC Blood Culture adequate volume Performed at [place] [address], [place], [place] [address]    Culture   Final    NO GROWTH 5 DAYS Performed at [facility], [place] [address] [place], [place] [address]    Report Status 03/28/2020 FINAL  Final  Urine culture     Status: None   Collection Time: 03/24/20 12:34 AM   Specimen: In/Out Cath Urine  Result Value Ref Range Status   Specimen Description   Final    IN/OUT CATH URINE Performed at [place] [address] [place], [place] [address]    Special Requests   Final    NONE Performed at [facility], [place] [address] [place], [place] [address]    Culture   Final    NO GROWTH Performed at [facility], [place] [address] [place], [place] [address]    Report Status 03/25/2020 FINAL  Final  Surgical pcr screen     Status: None   Collection Time: 03/25/20  7:44 PM   Specimen: Nasal Mucosa; Nasal Swab  Result Value Ref Range Status   MRSA, PCR NEGATIVE NEGATIVE Final   Staphylococcus aureus NEGATIVE NEGATIVE Final    Comment: (NOTE) The Xpert SA Assay (FDA approved for NASAL specimens in patients 82 years of age and older), is one component of a comprehensive surveillance program. It is not intended to diagnose  infection nor to guide or monitor treatment. Performed at [facility], [place] [address], [place], [place] [address]   Body fluid culture     Status: None   Collection Time: 03/27/20 11:53 AM   Specimen: BILE; Body Fluid  Result Value Ref Range Status   Specimen Description   Final    BILE Performed at [place] [address] [place],  [address]    Special Requests NONE  Final   Gram Stain NO WBC SEEN NO ORGANISMS SEEN   Final   Culture   Final    NO GROWTH 3 DAYS Performed at [facility], [place] [address] [place], [place] [address]    Report Status 03/30/2020 FINAL  Final     Time coordinating discharge: Over 30 minutes  SIGNED:   Little Ishikawa, DO [facility] 04-06-20, 1:02 PM Pager   If 7PM-7AM, please contact night-coverage [email or web address]

## 2020-04-20 NOTE — Progress Notes (Signed)
SLP Cancellation Note  Patient Details Name: [name] MRN: [id number] DOB: 1936-01-10   Cancelled treatment:        RN reported pt responding mostly to only pain presently. She attempted to open eyes with this therapist but could not. Not alert enough to follow commands. Noted Pallliative care at 11:00 today. Will follow up for outcome of meeting and/or another attempt at bedside when able.    Royce Macadamia 04/04/2020, 12:40 PM   Breck Coons Lonell Face.Ed Nurse, children's [phone] Office [phone]

## 2020-04-20 NOTE — Progress Notes (Signed)
Continuous pulse oxygen monitor alarmed staff that patient's oxygen saturation was in the 80s. RN elevated patient's head and noticed that patient was having a lot of oral secretions this morning, patient was suctioned with oral yankauer with moderate thick-thin secretions.  Oxygen saturation did not improve, RR RN notified and made aware of patient's condition.  RR RN suggested obtaining an order for a hyoscine patch from the on-call MD.  On-call MD notified and new order given for hyoscine patch. Patient was assessed by the RR RN, 2L of oxygen applied and oxygen saturation improved to lower 90s.  RN will continue to monitor patient and apply patch once verified by pharmacy. Patient resting in bed with bed in lowest position.

## 2020-04-20 NOTE — Death Summary Note (Signed)
Death Summary  [name] IOX:[id number] DOB: 25-Jul-1936 DOA: 04-15-2020  PCP: Jethro Bastos, MD  Admit date: 2020-04-15 Date of Death: Apr 24, 2020  Final Diagnoses:  Active Problems:   Acute cholecystitis   Coronary artery disease involving native coronary artery of native heart without angina pectoris   Acute calculous cholecystitis   Acute kidney injury superimposed on chronic kidney disease (HCC)   Chronic diastolic CHF (congestive heart failure) (HCC)    1. Sepsis with cholecystitis 2. Acute renal failure 3. Acute metabolic encephalopathy   Hospital Course:  Patient admitted with acute cholecystitis meeting sepsis criteria at intake, unfortunately she became a more unstable with hypotension in the setting of sepsis unstable for surgical procedure which was initially planned to remove the gallbladder, ultimately had cholecystostomy tube placed by interventional radiology with adequate infection control and IV antibiotics.  The patient's sepsis criteria did ultimately improve but in the interim had markedly low blood pressure with worsening renal function on baseline CKD.  During hospital stay patient became anuric, nephrology was consulted for further evaluation and treatment but after further discussion with family and nephrology patient was not an ideal candidate for dialysis.  We continued IV fluids in hopes to improve patient's kidney function renal output however this was not the case and her kidneys continued to worsen over the past 72 hours.  Patient's mental status continued to wax and wane over the past few days and giving her ongoing uremia and renal failure her mental status continued to worsen.  Lengthy discussion with family daily about patient's worsening condition and ultimately patient was made comfort measures on Apr 24, 2020 given ongoing worsening clinical status and renal failure.  Today at 1738 patient ultimately passed, family was kept updated by staffing and was  made aware of her acutely worsening condition throughout the day.   Time: 1738  Signed:  Azucena Fallen  [facility] 2020/04/24, 5:51 PM

## 2020-04-20 NOTE — Progress Notes (Signed)
Manufacturing systems engineer [facility]) Hospital Liaison note.   Received request from Dr. Neale Burly for family interest in [facility]. Chart reviewed and eligibility confirmed. Spoke with family to confirm interest and explain services. Family agreeable to transfer today. TOC aware.   [facility] will notify TOC [facility], CSW when registration paperwork has been completed to arrange transport.  RN please call report to [phone].   Thank you,  Elsie Saas, RN, CCM  [facility] Liaison (listed on AMION under [facility]osp[facility])  [phone]

## 2020-04-20 NOTE — Progress Notes (Signed)
Patient has became more dypnic. Breaths are becoming further apart and her O2 is no longer registering on the monitor. Spoke w/ daughter and made her aware of patient status update. She stated that she is going to call her breather and niece to come up to the hospital and be with patient.

## 2020-04-20 NOTE — Care Management Important Message (Signed)
Important Message  Patient Details IM Letter given to Daryel Gerald SW Case Manager to present to the Patient Name: [name] MRN: [id number] Date of Birth: [birth year]   Medicare Important Message Given:  Yes     Caren Macadam 04/29/2020, 11:24 AM

## 2020-04-20 NOTE — Consult Note (Signed)
Consultation Note Date: Apr 24, 2020   Patient Name: [name]  DOB: 05-May-1936  MRN: [id number]  Age / Sex: 84 y.o., female  PCP: Janifer Adie, MD Referring Physician: Little Ishikawa, MD  Reason for Consultation: Establishing goals of care and Inpatient hospice referral  HPI/Patient Profile: 84 y.o. female  with past medical history of clinically blind, diabetes (diet-controlled), hypertension, CHF, CKD stage IIIb remote history of pancreatitis admitted on 04/13/2020 with sepsis secondary to cholecystitis.  She was not a surgical candidate and percutaneous drain was placed with improvement in infectious symptoms.  Unfortunately, she experienced an uric renal failure and did not respond to conservative interventions.  She is not a candidate for consideration for dialysis.  Her mental status has continued to decline and she is largely somnolent.  Palliative consulted for goals of care.  Of note, [facility]  Clinical Assessment and Goals of Care: Palliative care consult received.  Chart reviewed including personal review of pertinent labs and imaging.  I saw and examined [name] today.  She is somnolent and unable to participate in conversation.  I called and spoke with Dr. Bradd Burner from the [facility] program as well as Lovena Le who is her LCSW at [facility] [place] to discuss her current condition as well as next steps moving forward.  Her care team from [facility] is supportive whatever care plan family believes would be the best option for [name] moving forward in light of her renal failure, including home with plan for comfort vs residential hospice placement.  I met today with [name] family including her daughter, son, granddaughter, great granddaughter, and 2 sisters.  I introduced palliative care as specialized medical care for people living with serious illness. It  focuses on providing relief from the symptoms and stress of a serious illness. The goal is to improve quality of life for both the patient and the family.  Her family reports that [name] has always been a social woman whose life is defined by her faith and love of her family.  She has a love of music and a great deal of love for her grandchildren and great-grandchildren.  Family reports that the doctors have been doing a good job explaining things to them and they understand that her kidneys are shutting down.  We discussed clinical course as well as wishes moving forward in light of the fact that she has suffered renal failure and is likely approaching end of life regardless of interventions moving forward.  We discussed difference between a aggressive medical intervention path and a palliative, comfort focused care path.   Family reports that the most important things at this point in time are ensuring that [name] is comfortable, finding someplace that she will be well cared for (family does not think they will be able to meet her care needs at home in light of the severity of her condition), and having her be someplace that family can come and be with her.  Concept  of comfort care and potential for residential hospice discussed as this seemed to be most in line with family's desires for her care moving forward.  Family expressed that they would like to work to transition to [facility] for end-of-life care.  We will work to coordinate this with [facility] and [place].  Questions and concerns addressed.   PMT will continue to support holistically.  SUMMARY OF RECOMMENDATIONS   -Primary goal is for [name] comfort moving forward.  I updated her care plan using the end-of-life order set for focus on comfort. -Family would like to work to transition [name] to [facility] for end-of-life care. -I spoke with Dr. Bradd Burner, Lovena Le (the LCSW at [facility]), and liaison from [place] in  order to coordinate care and transition from the hospital to [place] for end-of-life care.  Code Status/Advance Care Planning:  DNR   Symptom Management:   Currently appears comfortable.   Symptom medications as needed for comfort ordered with end-of-life order set  Palliative Prophylaxis:   Frequent Pain Assessment  Additional Recommendations (Limitations, Scope, Preferences):  Full Comfort Care  Psycho-social/Spiritual:   Desire for further Chaplaincy support:Did not address today.  Additional Recommendations: Education on Hospice  Prognosis:   < 2 weeks-[name] was admitted with sepsis from cholecystitis with subsequent anuric renal failure.  In light of her anuric renal failure, her expected prognosis is less than 2 weeks.  Discharge Planning: Hospice facility      Primary Diagnoses: Present on Admission: . Acute cholecystitis   I have reviewed the medical record, interviewed the patient and family, and examined the patient. The following aspects are pertinent.  Past Medical History:  Diagnosis Date  . Anxiety   . Arthritis   . Blood transfusion without reported diagnosis   . Chronic diastolic CHF (congestive heart failure) ([place])   . Coronary artery disease    LHC 05/25/06: No renal artery stenosis, proximal LAD 40%, proximal D1 40%  . Diabetes mellitus    a1c 7.3 in 10/2011  . GERD (gastroesophageal reflux disease)   . GLAUCOMA 12/06/2008   Vision Loss right eye   . HYPERLIPIDEMIA 12/27/2006  . Hypertension   . NICM (nonischemic cardiomyopathy) ([place])   . OSA (obstructive sleep apnea) 03/04/2012  . Proliferative diabetic retinopathy 01/28/2009   Severe Disease with Near Complete Blindness.    . Schizophrenia ([place])   . Stage III chronic kidney disease 01/10/2007  . Systolic heart failure ([place])    Echo 10/09: EF 30-35%. Echo 2/13: EF 50-55%.  . Visual hallucinations 07/15/2011   Social History   Socioeconomic History  . Marital status: Widowed     Spouse name: Not on file  . Number of children: 5  . Years of education: RN school  . Highest education level: Not on file  Occupational History  . Occupation: Retired    Comment: formerly worked as a Chief Executive Officer  Tobacco Use  . Smoking status: Never Smoker  . Smokeless tobacco: Never Used  . Tobacco comment: does have second hand smoke exposure  Substance and Sexual Activity  . Alcohol use: No  . Drug use: No  . Sexual activity: Never    Birth control/protection: Post-menopausal  Other Topics Concern  . Not on file  Social History Narrative   Lives with son who helps with her medications.   Social Determinants of Health   Financial Resource Strain:   . Difficulty of Paying Living Expenses:   Food Insecurity:   . Worried About Running  Out of Food in the Last Year:   . [place] in the Last Year:   Transportation Needs:   . Lack of Transportation (Medical):   Marland Kitchen Lack of Transportation (Non-Medical):   Physical Activity:   . Days of Exercise per Week:   . Minutes of Exercise per Session:   Stress:   . Feeling of Stress :   Social Connections:   . Frequency of Communication with Friends and Family:   . Frequency of Social Gatherings with Friends and Family:   . Attends Religious Services:   . Active Member of Clubs or Organizations:   . Attends Archivist Meetings:   Marland Kitchen Marital Status:    Family History  Problem Relation Age of Onset  . Heart disease Maternal Grandmother   . Prostate cancer Father   . Colon cancer Maternal Aunt    Scheduled Meds: . Chlorhexidine Gluconate Cloth  6 each Topical Daily  . enoxaparin (LOVENOX) injection  30 mg Subcutaneous Q24H  . scopolamine  1 patch Transdermal Q72H  . sodium chloride flush  5 mL Intracatheter Q8H   Continuous Infusions: . ceFEPime (MAXIPIME) IV 1 g (03/31/20 1706)  . metronidazole 500 mg (Apr 13, 2020 0943)   PRN Meds:.HYDROcodone-acetaminophen, ondansetron **OR** ondansetron (ZOFRAN)  IV Medications Prior to Admission:  Prior to Admission medications   Medication Sig Start Date End Date Taking? Authorizing Provider  allopurinol (ZYLOPRIM) 100 MG tablet Take 200 mg by mouth daily with breakfast.    Yes [provider]  aspirin EC 81 MG tablet Take 81 mg by mouth daily.   Yes [provider]  carvedilol (COREG) 6.25 MG tablet Take 6.25 mg by mouth in the morning and at bedtime.   Yes [provider]  Cholecalciferol 1.25 MG (50000 UT) TABS Take 1 tablet by mouth in the morning and at bedtime.   Yes [provider]  furosemide (LASIX) 20 MG tablet Take 20 mg by mouth 2 (two) times daily.   Yes [provider]  potassium chloride (KLOR-CON) 10 MEQ tablet Take 10 mEq by mouth daily.   Yes [provider]  sennosides-docusate sodium (SENOKOT-S) 8.6-50 MG tablet Take 2 tablets by mouth in the morning and at bedtime.    Yes [provider]  atorvastatin (LIPITOR) 20 MG tablet Take 1 tablet (20 mg total) by mouth daily. Patient not taking: Reported on 04/08/2020 12/30/12   Trinda Pascal, MD  docusate sodium 100 MG CAPS Take 100 mg by mouth 2 (two) times daily. Patient not taking: Reported on 04/07/2020 09/13/12   Gae Gallop, MD  ergocalciferol (VITAMIN D2) 50000 UNITS capsule Take 50,000 Units by mouth every Wednesday.     [provider]  potassium chloride (KLOR-CON M15) 15 MEQ tablet Take 2 tablets (30 mEq total) by mouth once. Patient not taking: Reported on 04/14/2020 10/01/16 10/01/16  Nita Sells, MD   Allergies  Allergen Reactions  . Heparin Other (See Comments)    hallucinations    . Lisinopril Cough   Review of Systems  Unable to perform ROS: Acuity of condition   Unable to obtain  Physical Exam General: Somnolent HEENT: No bruits, no goiter, no JVD Heart: Tachycardic Lungs: Decreased air movement, clear Abdomen: Soft, nontender, nondistended, positive bowel sounds.  Ext: +  edema Skin: Warm and dry  Vital Signs: BP 136/64 (BP Location: Left Arm)   Pulse 99   Temp 98.2 F (36.8 C) (Oral)   Resp 17   Ht  _0  (1.6 m)   Wt 103.9 kg   SpO2 92%   BMI 40.58 kg/m  Pain Scale: Faces POSS *See Group Information*: 1-Acceptable,Awake and alert Pain Score: 0-No pain   SpO2: SpO2: 92 % O2 Device:SpO2: 92 % O2 Flow Rate: .O2 Flow Rate (L/min): 2 L/min  IO: Intake/output summary:   Intake/Output Summary (Last 24 hours) at 2020/04/22 1213 Last data filed at 03/31/2020 1400 Gross per 24 hour  Intake 105 ml  Output 130 ml  Net -25 ml    LBM: Last BM Date: 03/29/20 Baseline Weight: Weight: 85.7 kg Most recent weight: Weight: 103.9 kg     Palliative Assessment/Data:   Flowsheet Rows     Most Recent Value  Intake Tab  Referral Department  Hospitalist  Unit at Time of Referral  Med/Surg Unit  Palliative Care Primary Diagnosis  Nephrology  Date Notified  03/31/20  Palliative Care Type  New Palliative care  Reason for referral  Clarify Goals of Care  Date of Admission  04/12/2020  Date first seen by Palliative Care  04/22/2020  # of days Palliative referral response time  1 Day(s)  # of days IP prior to Palliative referral  8  Clinical Assessment  Palliative Performance Scale Score  20%  Psychosocial & Spiritual Assessment  Palliative Care Outcomes  Patient/Family meeting held?  Yes  Who was at the meeting?  daughter, son, granddaughter, 2 sisters      Time In: 11 Time Out: 1210 Time Total: 53 Greater than 50%  of this time was spent counseling and coordinating care related to the above assessment and plan.  Signed by: Micheline Rough, MD   Please contact Palliative Medicine Team phone at [phone] for questions and concerns.  For individual provider: See Shea Evans

## 2020-04-20 NOTE — TOC Transition Note (Addendum)
Transition of Care [facility]) - CM/SW Discharge Note   Patient Details  Name: [name] MRN: [id number] Date of Birth: 03-03-1936  Transition [facility] Care [facility]) CM/SW Contact:  Ida Rogue, LCSW Phone Number: 04/09/2020, 1:13 PM   Clinical Narrative:   Received messages from both Dr Neale Burly and Chales Abrahams with [facility] that family has chosen [facility] and wants patient to go to [facility].  Chales Abrahams is following up with the family and Ladona Ridgel, Child psychotherapist at [facility].  TOC standing by.  Addendum: Ladona Ridgel states [facility] approves of use of [facility]; OK to send patient when bed confirmed by [facility].  Left message for Chales Abrahams at same.   Addendum II: Received call from Chales Abrahams stating opening for patient will be tomorrow AM.    Final next level of care: Hospice Medical Facility Barriers to Discharge: Barriers Resolved   Patient Goals and CMS Choice        Discharge Placement                       Discharge Plan and Services   Discharge Planning Services: CM Consult                                 Social Determinants of Health (SDOH) Interventions     Readmission Risk Interventions No flowsheet data found.

## 2020-04-20 NOTE — Progress Notes (Signed)
LMOVM for patient daughter to return call to update on patient status.

## 2020-04-20 NOTE — Progress Notes (Signed)
Manufacturing systems engineer [facility]) Hospital Liaison note.   Patient can transfer to [facility] on 4/13 am. Please arrange transport.  RN please call report to [phone].   Please call with any questions.   Thank you,  Elsie Saas, RN, CCM  [facility] Liaison (listed on [facility] under Hospice/[facility])  [phone]

## 2020-04-20 NOTE — Progress Notes (Signed)
Nurse Denzil Hughes came and notified me that patient oxygen levels were dropping to the 78%. I came into patient room and assessed her. Gave 1 dose of Robinul. Charge Nurse Christena Deem came into room. Messaged Dr. Natale Milch and Dr. Neale Burly via secure chat. Dr. Natale Milch stated to try and maintain patient oxygen levels. Attempted to increase O2 to 6 L with no increase in O2 sats. Attempted a venturi mask with no increase in O2 sats. Moved O2 probe to patient forehead and she is now sating around 99-100%. LMOVM for patient daughter to return call earlier around 1709.

## 2020-04-20 NOTE — Progress Notes (Signed)
Notified Dr. Natale Milch and Dr. Neale Burly by secure chat that patient had expired.   Called daughter [name] and notified her patient passing.

## 2020-04-20 NOTE — Significant Event (Signed)
Rapid Response Event Note  Overview: Time Called: 0555 Arrival Time: 0600 Event Type: Other (Comment)(Increased secretions, low O2)  Initial Focused Assessment: Primary nurse called RRT to bedside for concern of low O2 and increased oral secretions that staff is unable to suction. RRT recommended hyoscine patch. On call MD notified per staff RN. Assessment:  Patient resting comfortably, awakens to stimuli, noted rattle in back of throat, O2 saturations 85-88 on room air.  Breath sounds diminished.  See VS     Interventions Oxygn applied at 2 liters [place] Hyoscine patch  Plan of Care (if not transferred): Remain in room Palliative meeting with family in AM  Event Summary: Name of Physician Notified: Dr. Welton Flakes at 0600    at    Outcome: Stayed in room and stabalized  Event End Time: 0630  Sharyn Lull Kyliah Deanda

## 2020-04-20 DEATH — deceased

## 2020-04-30 ENCOUNTER — Other Ambulatory Visit (HOSPITAL_COMMUNITY): Payer: Medicare (Managed Care)

## 2021-01-28 IMAGING — US US RENAL
1 series · 14 of 25 positions shown · non-contrast
Comparison: None.

CLINICAL DATA: Acute on chronic renal failure

EXAM:
RENAL / URINARY TRACT ULTRASOUND COMPLETE

[Series 1: us renal · 14 of 26 slices shown]
[im 1/26]
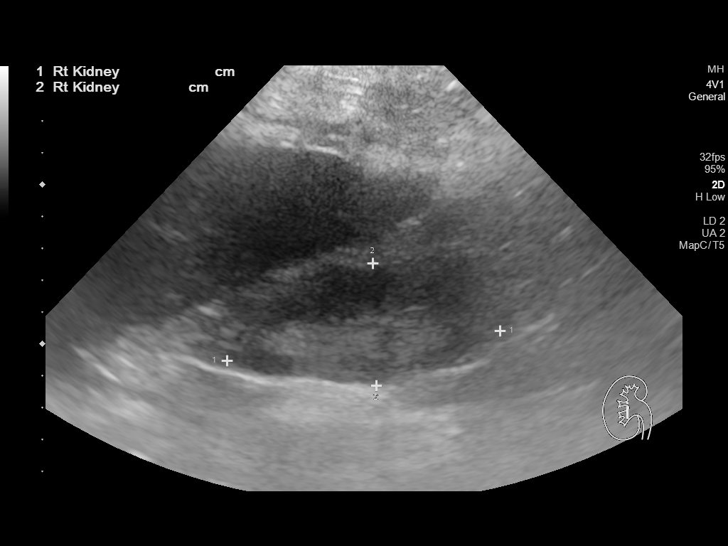
[im 3/26]
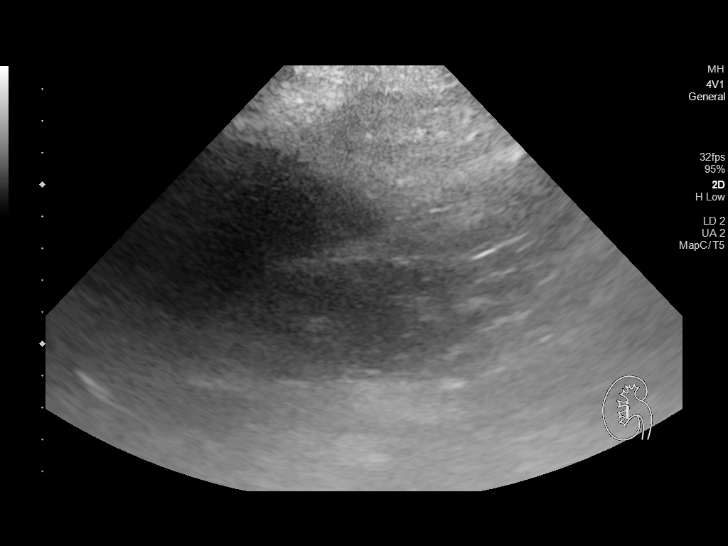
[im 5/26]
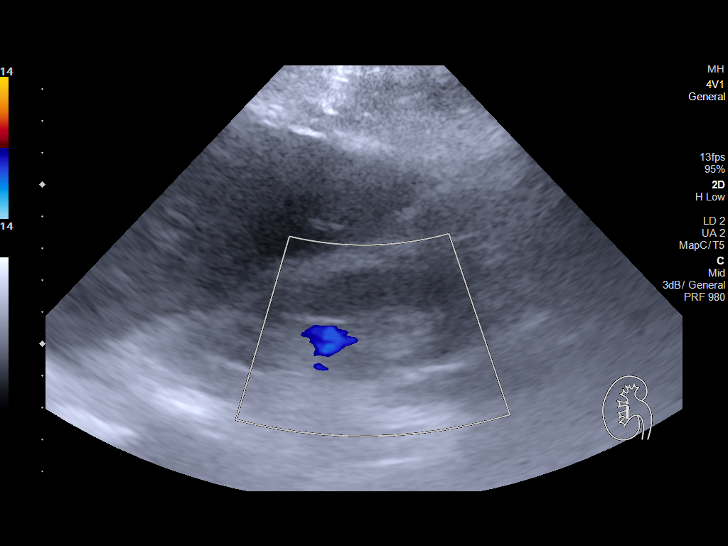
[im 7/26]
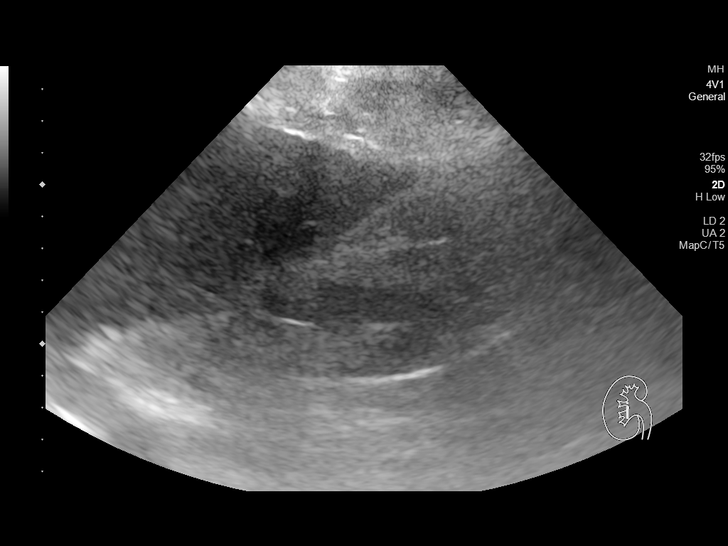
[im 9/26]
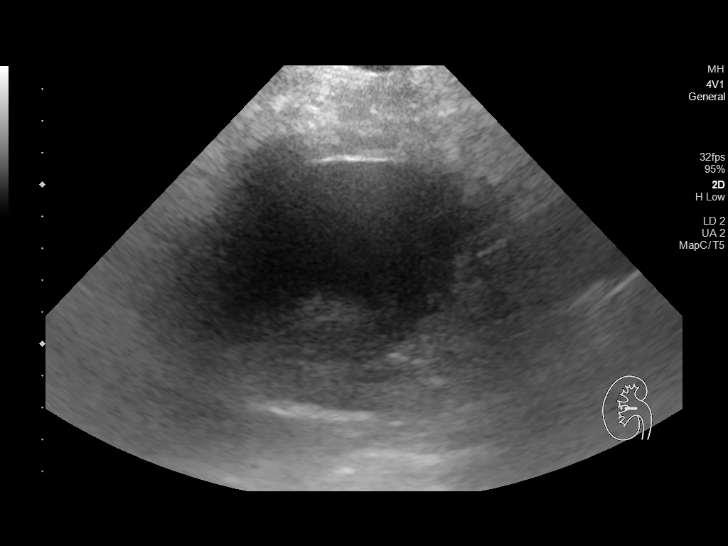
[im 10/26]
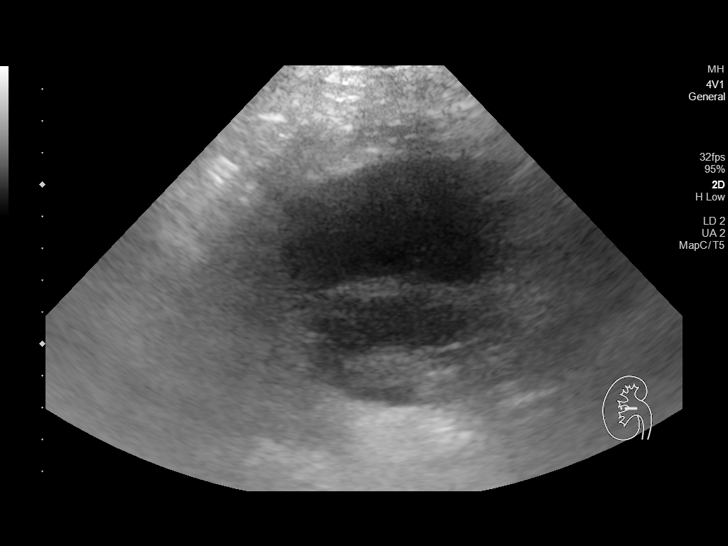
[im 12/26]
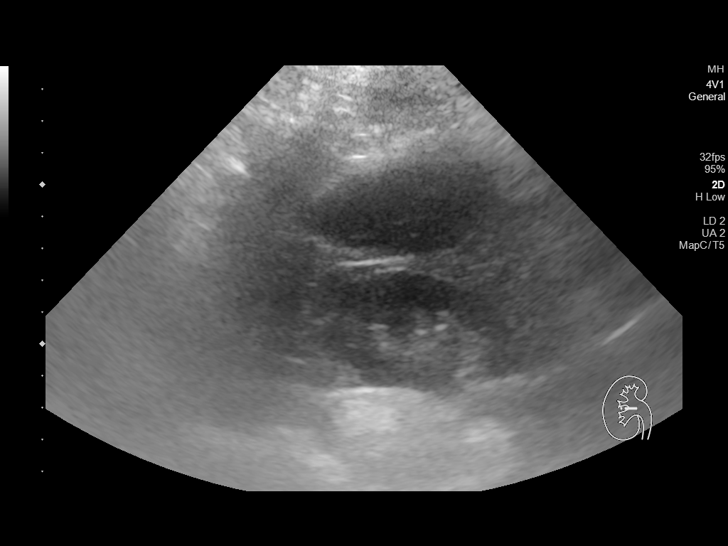
[im 14/26]
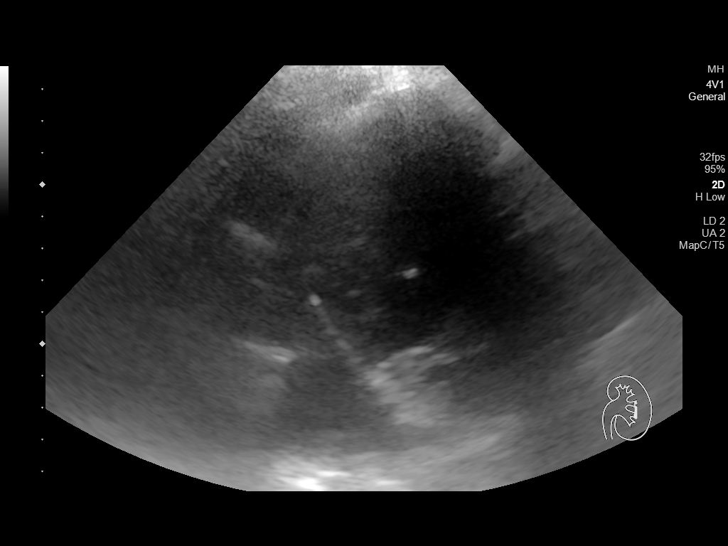
[im 16/26]
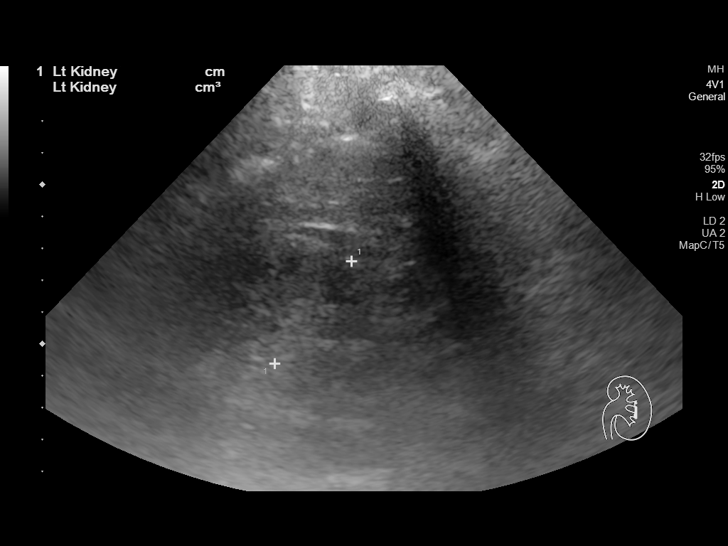
[im 17/26]
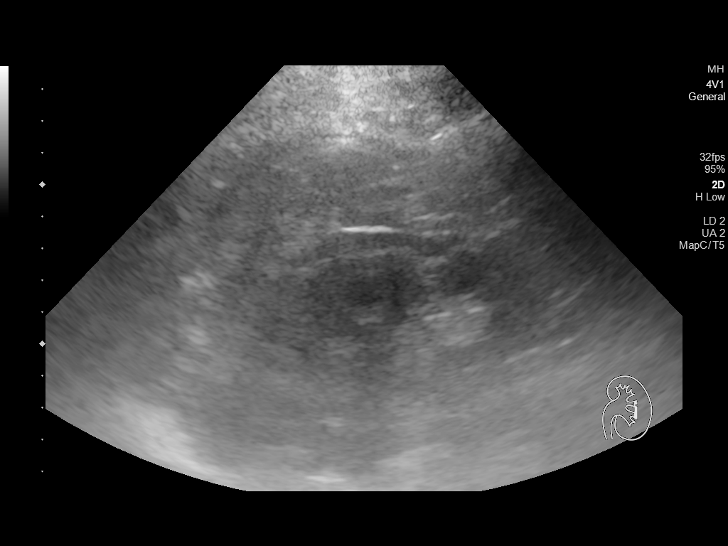
[im 19/26]
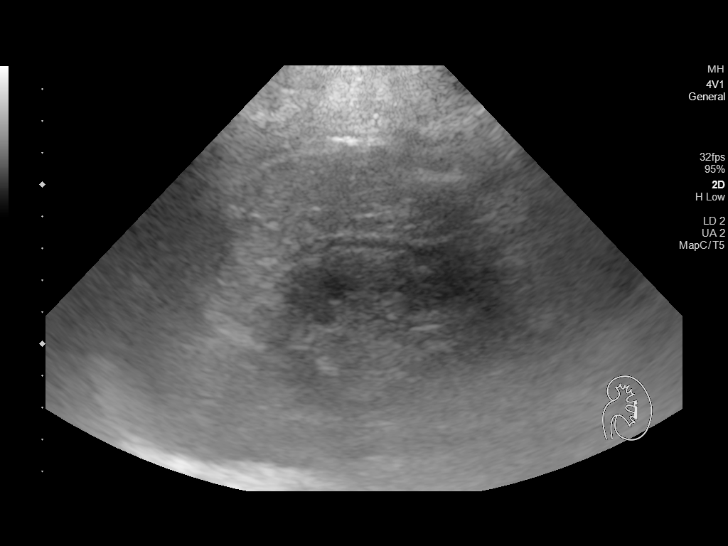
[im 21/26]
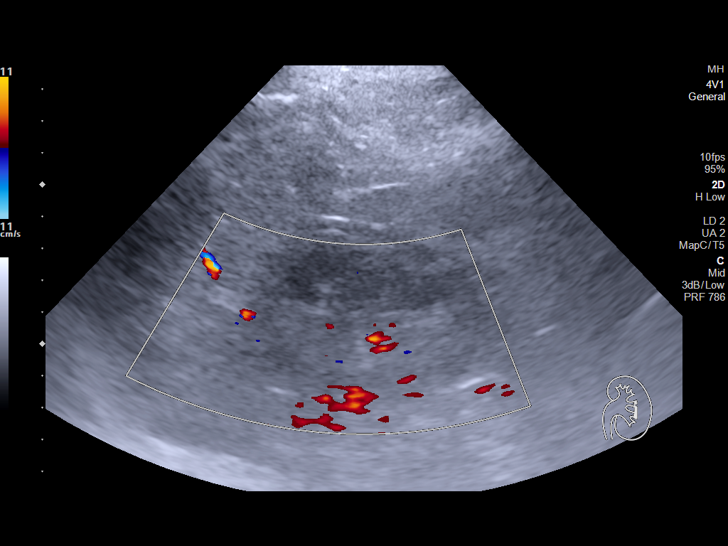
[im 23/26]
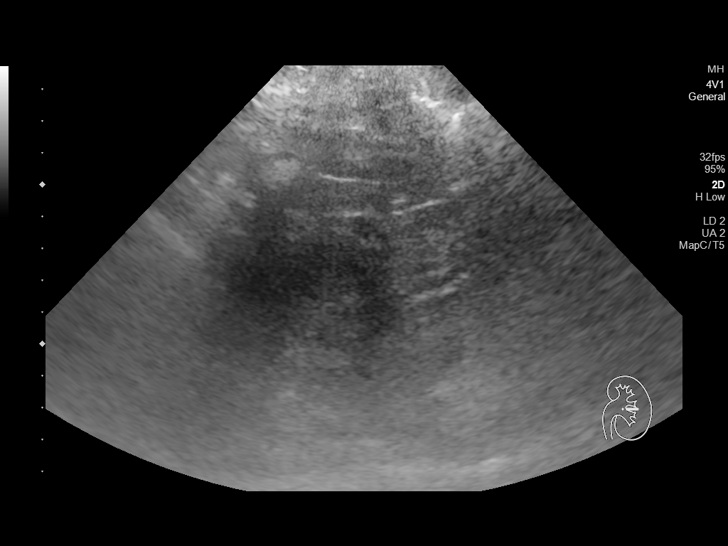
[im 26/26]
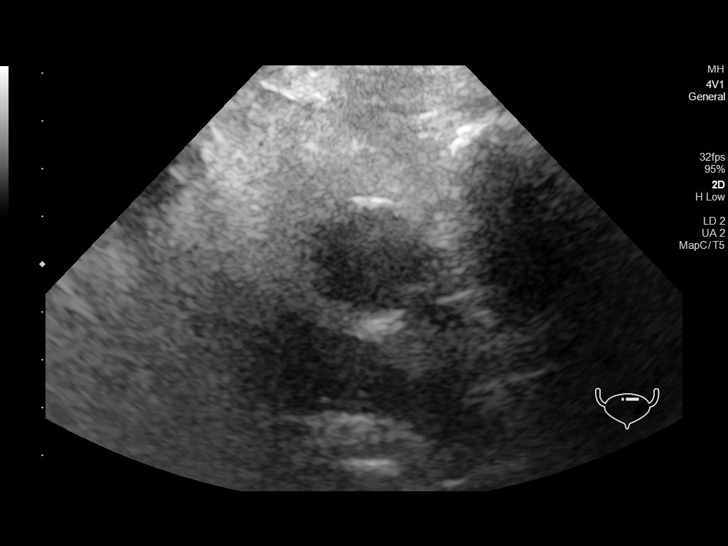

[14 of 25 positions shown; findings below may reference images not displayed]

FINDINGS: Right Kidney:

Renal measurements: 8.6 x 3.8 x 3.7 cm. = volume: 64 mL. Increased
echogenicity is noted consistent with medical renal disease.

Left Kidney:

Renal measurements: 8.5 x 4.3 x 4.0 cm. = volume: 77 mL. Increased
echogenicity is noted.

Bladder:

Decompressed by Foley catheter

Other:

None.
IMPRESSION: Increased echogenicity consistent with medical renal disease.

No other focal abnormality is noted.
